# Patient Record
Sex: Female | Born: 1990 | Race: White | Hispanic: No | Marital: Single | Smoking: Former smoker
Health system: Southern US, Community
[De-identification: ages and names within clinical notes are randomized; demographics above are authoritative.]

## PROBLEM LIST (undated history)

## (undated) ENCOUNTER — Telehealth

## (undated) ENCOUNTER — Ambulatory Visit: Payer: MEDICAID | Attending: Obstetrics & Gynecology | Primary: Obstetrics & Gynecology

## (undated) ENCOUNTER — Ambulatory Visit

## (undated) ENCOUNTER — Encounter: Attending: Geriatric Medicine | Primary: Geriatric Medicine

## (undated) ENCOUNTER — Ambulatory Visit: Payer: MEDICAID

## (undated) ENCOUNTER — Encounter

## (undated) ENCOUNTER — Encounter: Attending: Hematology & Oncology | Primary: Hematology & Oncology

## (undated) ENCOUNTER — Encounter
Attending: Student in an Organized Health Care Education/Training Program | Primary: Student in an Organized Health Care Education/Training Program

## (undated) ENCOUNTER — Encounter: Attending: Family | Primary: Family

## (undated) ENCOUNTER — Encounter: Attending: Pharmacist | Primary: Pharmacist

## (undated) ENCOUNTER — Ambulatory Visit: Payer: Medicaid (Managed Care)

## (undated) ENCOUNTER — Ambulatory Visit: Payer: PRIVATE HEALTH INSURANCE

## (undated) ENCOUNTER — Ambulatory Visit: Payer: MEDICAID | Attending: Clinical | Primary: Clinical

## (undated) ENCOUNTER — Ambulatory Visit
Payer: MEDICAID | Attending: Student in an Organized Health Care Education/Training Program | Primary: Student in an Organized Health Care Education/Training Program

## (undated) ENCOUNTER — Encounter: Payer: Medicaid (Managed Care) | Attending: Children | Primary: Children

## (undated) ENCOUNTER — Encounter: Attending: Adult Health | Primary: Adult Health

## (undated) ENCOUNTER — Inpatient Hospital Stay: Payer: Medicaid (Managed Care)

## (undated) ENCOUNTER — Ambulatory Visit: Payer: PRIVATE HEALTH INSURANCE | Attending: Geriatric Medicine | Primary: Geriatric Medicine

## (undated) ENCOUNTER — Ambulatory Visit: Payer: Medicaid (Managed Care) | Attending: Geriatric Medicine | Primary: Geriatric Medicine

## (undated) ENCOUNTER — Telehealth: Attending: Adult Health | Primary: Adult Health

## (undated) ENCOUNTER — Ambulatory Visit
Payer: PRIVATE HEALTH INSURANCE | Attending: Student in an Organized Health Care Education/Training Program | Primary: Student in an Organized Health Care Education/Training Program

## (undated) ENCOUNTER — Encounter: Attending: Clinical | Primary: Clinical

## (undated) ENCOUNTER — Encounter: Attending: Acute Care | Primary: Acute Care

## (undated) ENCOUNTER — Institutional Professional Consult (permissible substitution): Payer: MEDICAID | Attending: Family | Primary: Family

## (undated) ENCOUNTER — Telehealth: Attending: Family | Primary: Family

## (undated) ENCOUNTER — Ambulatory Visit: Payer: MEDICAID | Attending: Adult Health | Primary: Adult Health

## (undated) ENCOUNTER — Ambulatory Visit: Payer: MEDICAID | Attending: Registered" | Primary: Registered"

## (undated) ENCOUNTER — Ambulatory Visit: Payer: PRIVATE HEALTH INSURANCE | Attending: Family | Primary: Family

## (undated) ENCOUNTER — Ambulatory Visit
Payer: Medicaid (Managed Care) | Attending: Student in an Organized Health Care Education/Training Program | Primary: Student in an Organized Health Care Education/Training Program

## (undated) ENCOUNTER — Encounter: Attending: Mental Health | Primary: Mental Health

## (undated) ENCOUNTER — Ambulatory Visit: Payer: MEDICAID | Attending: Geriatric Medicine | Primary: Geriatric Medicine

## (undated) ENCOUNTER — Encounter: Attending: Ambulatory Care | Primary: Ambulatory Care

## (undated) ENCOUNTER — Encounter: Attending: Registered" | Primary: Registered"

## (undated) ENCOUNTER — Ambulatory Visit: Payer: PRIVATE HEALTH INSURANCE | Attending: Ambulatory Care | Primary: Ambulatory Care

## (undated) ENCOUNTER — Telehealth: Attending: Clinical | Primary: Clinical

## (undated) ENCOUNTER — Ambulatory Visit: Payer: Medicaid (Managed Care) | Attending: Ambulatory Care | Primary: Ambulatory Care

## (undated) ENCOUNTER — Institutional Professional Consult (permissible substitution): Payer: MEDICAID

## (undated) ENCOUNTER — Telehealth: Attending: "Endocrinology | Primary: "Endocrinology

## (undated) ENCOUNTER — Telehealth: Attending: Acute Care | Primary: Acute Care

## (undated) ENCOUNTER — Other Ambulatory Visit

## (undated) ENCOUNTER — Telehealth: Attending: Geriatric Medicine | Primary: Geriatric Medicine

## (undated) ENCOUNTER — Telehealth
Attending: Student in an Organized Health Care Education/Training Program | Primary: Student in an Organized Health Care Education/Training Program

## (undated) ENCOUNTER — Encounter: Attending: Obstetrics & Gynecology | Primary: Obstetrics & Gynecology

## (undated) ENCOUNTER — Telehealth: Attending: Mental Health | Primary: Mental Health

## (undated) ENCOUNTER — Ambulatory Visit: Payer: Medicaid (Managed Care) | Attending: Family | Primary: Family

## (undated) ENCOUNTER — Encounter: Attending: Internal Medicine | Primary: Internal Medicine

## (undated) ENCOUNTER — Encounter: Attending: Physician Assistant | Primary: Physician Assistant

## (undated) ENCOUNTER — Ambulatory Visit: Payer: Medicaid (Managed Care) | Attending: Gastroenterology | Primary: Gastroenterology

## (undated) ENCOUNTER — Telehealth: Payer: MEDICAID | Attending: Clinical | Primary: Clinical

## (undated) ENCOUNTER — Telehealth: Attending: Gastroenterology | Primary: Gastroenterology

## (undated) ENCOUNTER — Ambulatory Visit: Payer: Medicaid (Managed Care) | Attending: Hematology & Oncology | Primary: Hematology & Oncology

## (undated) ENCOUNTER — Encounter: Attending: Pulmonary Disease | Primary: Pulmonary Disease

## (undated) ENCOUNTER — Encounter: Payer: Medicaid (Managed Care) | Attending: Family | Primary: Family

## (undated) ENCOUNTER — Ambulatory Visit: Payer: MEDICAID | Attending: Surgery | Primary: Surgery

## (undated) ENCOUNTER — Telehealth: Attending: Pharmacist | Primary: Pharmacist

## (undated) ENCOUNTER — Inpatient Hospital Stay

## (undated) ENCOUNTER — Telehealth: Attending: Hematology & Oncology | Primary: Hematology & Oncology

## (undated) ENCOUNTER — Encounter: Attending: Surgery | Primary: Surgery

## (undated) DIAGNOSIS — E282 Polycystic ovarian syndrome: Secondary | ICD-10-CM

## (undated) DIAGNOSIS — F32A Depression, unspecified: Secondary | ICD-10-CM

## (undated) DIAGNOSIS — F4310 Post-traumatic stress disorder, unspecified: Secondary | ICD-10-CM

## (undated) DIAGNOSIS — E119 Type 2 diabetes mellitus without complications: Secondary | ICD-10-CM

## (undated) DIAGNOSIS — I10 Essential (primary) hypertension: Secondary | ICD-10-CM

## (undated) HISTORY — DX: Type 2 diabetes mellitus without complications: E11.9

## (undated) HISTORY — DX: Essential (primary) hypertension: I10

## (undated) HISTORY — DX: Post-traumatic stress disorder, unspecified: F43.10

## (undated) HISTORY — DX: Depression, unspecified: F32.A

## (undated) HISTORY — DX: Polycystic ovarian syndrome: E28.2

## (undated) HISTORY — PX: TONSILLECTOMY AND ADENOIDECTOMY: SHX28

## (undated) HISTORY — PX: WISDOM TOOTH EXTRACTION: SHX21

## (undated) MED ORDER — HYDROXYZINE HCL 50 MG TABLET: Freq: Four times a day (QID) | ORAL | 0 days | PRN

## (undated) MED ORDER — LEVONORGESTREL-ETHINYL ESTRADIOL 0.1 MG-20 MCG TABLET: Freq: Every day | ORAL | 0 days

## (undated) MED ORDER — NITROFURANTOIN MONOHYDRATE/MACROCRYSTALS 100 MG CAPSULE: ORAL | 0 days

---
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
## 2008-01-16 ENCOUNTER — Emergency Department: Payer: Self-pay | Admitting: Emergency Medicine

## 2009-08-09 ENCOUNTER — Encounter: Payer: Self-pay | Admitting: General Practice

## 2009-08-17 ENCOUNTER — Encounter: Payer: Self-pay | Admitting: General Practice

## 2009-08-29 ENCOUNTER — Emergency Department: Payer: Self-pay | Admitting: Emergency Medicine

## 2009-09-17 ENCOUNTER — Encounter: Payer: Self-pay | Admitting: General Practice

## 2009-10-17 ENCOUNTER — Encounter: Payer: Self-pay | Admitting: General Practice

## 2009-11-17 ENCOUNTER — Encounter: Payer: Self-pay | Admitting: General Practice

## 2010-08-19 ENCOUNTER — Emergency Department: Payer: Self-pay | Admitting: Emergency Medicine

## 2010-08-27 ENCOUNTER — Emergency Department: Payer: Self-pay | Admitting: Emergency Medicine

## 2011-01-03 ENCOUNTER — Emergency Department: Payer: Self-pay | Admitting: Emergency Medicine

## 2011-01-04 ENCOUNTER — Emergency Department: Payer: Self-pay | Admitting: Emergency Medicine

## 2011-01-11 ENCOUNTER — Emergency Department: Payer: Self-pay | Admitting: Emergency Medicine

## 2011-04-24 ENCOUNTER — Ambulatory Visit: Payer: Self-pay | Admitting: Family Medicine

## 2011-04-25 ENCOUNTER — Emergency Department: Payer: Self-pay | Admitting: Emergency Medicine

## 2011-09-28 ENCOUNTER — Emergency Department: Payer: Self-pay | Admitting: Emergency Medicine

## 2011-10-12 ENCOUNTER — Emergency Department: Payer: Self-pay | Admitting: Unknown Physician Specialty

## 2011-11-14 ENCOUNTER — Ambulatory Visit: Payer: Self-pay | Admitting: Urology

## 2011-12-06 ENCOUNTER — Emergency Department: Payer: Self-pay | Admitting: *Deleted

## 2011-12-06 LAB — COMPREHENSIVE METABOLIC PANEL
Alkaline Phosphatase: 98 U/L (ref 50–136)
Anion Gap: 15 (ref 7–16)
Bilirubin,Total: 0.3 mg/dL (ref 0.2–1.0)
Calcium, Total: 9.5 mg/dL (ref 8.5–10.1)
Chloride: 99 mmol/L (ref 98–107)
Co2: 25 mmol/L (ref 21–32)
Creatinine: 0.7 mg/dL (ref 0.60–1.30)
EGFR (African American): >60
EGFR (Non-African Amer.): >60
Potassium: 3.9 mmol/L (ref 3.5–5.1)
SGPT (ALT): 17 U/L
Total Protein: 8 g/dL (ref 6.4–8.2)

## 2011-12-06 LAB — CBC
HCT: 37.2 % (ref 35.0–47.0)
HGB: 12.5 g/dL (ref 12.0–16.0)
MCV: 86 fL (ref 80–100)
Platelet: 327 10*3/uL (ref 150–440)
RBC: 4.32 10*6/uL (ref 3.80–5.20)
RDW: 14 % (ref 11.5–14.5)
WBC: 7.3 10*3/uL (ref 3.6–11.0)

## 2011-12-07 ENCOUNTER — Emergency Department: Payer: Self-pay | Admitting: Emergency Medicine

## 2011-12-07 LAB — URINALYSIS, COMPLETE
RBC,UR: 8119 /HPF (ref 0–5)
Squamous Epithelial: 22
WBC UR: 7 /HPF (ref 0–5)

## 2012-01-30 ENCOUNTER — Emergency Department: Payer: Self-pay | Admitting: Emergency Medicine

## 2012-02-14 ENCOUNTER — Emergency Department (HOSPITAL_COMMUNITY)
Admission: EM | Admit: 2012-02-14 | Discharge: 2012-02-14 | Disposition: A | Discharge status: Home / Self Care | Payer: Self-pay

## 2012-02-14 NOTE — ED Notes (Signed)
Pt states continued pelvic pain after LEEP performed 02/13/12. Pt states she took tylenol x 1 tab after the procedure and nothing since. Pt c/o continued bleeding and sense of tightness and pressure. Pt states she has no otc meds at home to take for her pain.

## 2012-02-24 ENCOUNTER — Emergency Department: Payer: Self-pay | Admitting: Emergency Medicine

## 2012-03-10 ENCOUNTER — Inpatient Hospital Stay: Payer: Self-pay | Admitting: Psychiatry

## 2012-03-10 LAB — COMPREHENSIVE METABOLIC PANEL
Albumin: 3.1 g/dL — ABNORMAL LOW (ref 3.4–5.0)
Alkaline Phosphatase: 101 U/L (ref 50–136)
Anion Gap: 14 (ref 7–16)
Bilirubin,Total: 0.2 mg/dL (ref 0.2–1.0)
Calcium, Total: 9.2 mg/dL (ref 8.5–10.1)
Chloride: 101 mmol/L (ref 98–107)
Co2: 22 mmol/L (ref 21–32)
Creatinine: 0.75 mg/dL (ref 0.60–1.30)
EGFR (African American): >60
EGFR (Non-African Amer.): >60
Glucose: 124 mg/dL — ABNORMAL HIGH (ref 65–99)
Osmolality: 274 (ref 275–301)
Potassium: 3.8 mmol/L (ref 3.5–5.1)
SGPT (ALT): 10 U/L — ABNORMAL LOW
Total Protein: 7.5 g/dL (ref 6.4–8.2)

## 2012-03-10 LAB — URINALYSIS, COMPLETE
Bacteria: NONE SEEN
Glucose,UR: NEGATIVE mg/dL (ref 0–75)
Ketone: NEGATIVE
Ph: 6 (ref 4.5–8.0)
Protein: NEGATIVE
RBC,UR: <1 /HPF (ref 0–5)
Specific Gravity: 1.004 (ref 1.003–1.030)

## 2012-03-10 LAB — CBC
HCT: 35.3 % (ref 35.0–47.0)
HGB: 11.3 g/dL — ABNORMAL LOW (ref 12.0–16.0)
MCHC: 31.9 g/dL — ABNORMAL LOW (ref 32.0–36.0)
MCV: 85 fL (ref 80–100)
Platelet: 357 10*3/uL (ref 150–440)
RBC: 4.16 10*6/uL (ref 3.80–5.20)
RDW: 13.8 % (ref 11.5–14.5)
WBC: 11.6 10*3/uL — ABNORMAL HIGH (ref 3.6–11.0)

## 2012-03-10 LAB — ETHANOL
Ethanol %: <0.003 % (ref 0.000–0.080)
Ethanol: <3 mg/dL

## 2012-03-10 LAB — DRUG SCREEN, URINE
Amphetamines, Ur Screen: NEGATIVE (ref ?–1000)
Barbiturates, Ur Screen: NEGATIVE (ref ?–200)
Cocaine Metabolite,Ur ~~LOC~~: NEGATIVE (ref ?–300)
MDMA (Ecstasy)Ur Screen: NEGATIVE (ref ?–500)
Phencyclidine (PCP) Ur S: NEGATIVE (ref ?–25)
Tricyclic, Ur Screen: NEGATIVE (ref ?–1000)

## 2012-03-10 LAB — PREGNANCY, URINE: Pregnancy Test, Urine: NEGATIVE m[IU]/mL

## 2012-03-10 LAB — ACETAMINOPHEN LEVEL: Acetaminophen: <2 ug/mL

## 2012-03-11 LAB — CBC WITH DIFFERENTIAL/PLATELET
Basophil %: 0.3 %
Eosinophil #: 0.1 10*3/uL (ref 0.0–0.7)
HCT: 34.4 % — ABNORMAL LOW (ref 35.0–47.0)
HGB: 11.1 g/dL — ABNORMAL LOW (ref 12.0–16.0)
Lymphocyte #: 3.6 10*3/uL (ref 1.0–3.6)
MCH: 27.4 pg (ref 26.0–34.0)
MCHC: 32.2 g/dL (ref 32.0–36.0)
Monocyte %: 6 %
Neutrophil %: 56.9 %
Platelet: 324 10*3/uL (ref 150–440)
RBC: 4.04 10*6/uL (ref 3.80–5.20)
RDW: 14 % (ref 11.5–14.5)

## 2012-07-12 ENCOUNTER — Emergency Department: Payer: Self-pay | Admitting: Emergency Medicine

## 2012-12-22 ENCOUNTER — Encounter: Payer: Self-pay | Admitting: General Practice

## 2013-01-15 ENCOUNTER — Encounter: Payer: Self-pay | Admitting: General Practice

## 2013-03-16 ENCOUNTER — Emergency Department: Payer: Self-pay | Admitting: Emergency Medicine

## 2013-05-07 ENCOUNTER — Emergency Department: Payer: Self-pay | Admitting: Emergency Medicine

## 2013-05-07 LAB — URINALYSIS, COMPLETE
Bilirubin,UR: NEGATIVE
Glucose,UR: NEGATIVE mg/dL (ref 0–75)
Nitrite: NEGATIVE
Protein: 100
RBC,UR: 1 /HPF (ref 0–5)
Specific Gravity: 1.012 (ref 1.003–1.030)
Squamous Epithelial: 6

## 2013-05-10 ENCOUNTER — Emergency Department: Payer: Self-pay | Admitting: Emergency Medicine

## 2013-05-10 LAB — BASIC METABOLIC PANEL
Anion Gap: 10 (ref 7–16)
BUN: 8 mg/dL (ref 7–18)
Calcium, Total: 9.3 mg/dL (ref 8.5–10.1)
Chloride: 104 mmol/L (ref 98–107)
Co2: 22 mmol/L (ref 21–32)
Creatinine: 0.76 mg/dL (ref 0.60–1.30)
EGFR (African American): >60
EGFR (Non-African Amer.): >60
Glucose: 250 mg/dL — ABNORMAL HIGH (ref 65–99)
Osmolality: 279 (ref 275–301)
Potassium: 3.7 mmol/L (ref 3.5–5.1)
Sodium: 136 mmol/L (ref 136–145)

## 2013-05-10 LAB — URINALYSIS, COMPLETE
Bilirubin,UR: NEGATIVE
Blood: NEGATIVE
Glucose,UR: 50 mg/dL (ref 0–75)
Ketone: NEGATIVE
Nitrite: POSITIVE
Ph: 5 (ref 4.5–8.0)
RBC,UR: 6 /HPF (ref 0–5)
Transitional Epi: <1
WBC UR: 4 /HPF (ref 0–5)

## 2013-05-10 LAB — CBC
HCT: 34.2 % — ABNORMAL LOW (ref 35.0–47.0)
HGB: 11.4 g/dL — ABNORMAL LOW (ref 12.0–16.0)
MCV: 82 fL (ref 80–100)
Platelet: 369 10*3/uL (ref 150–440)
RBC: 4.18 10*6/uL (ref 3.80–5.20)
RDW: 15.3 % — ABNORMAL HIGH (ref 11.5–14.5)
WBC: 10.3 10*3/uL (ref 3.6–11.0)

## 2013-05-10 LAB — WET PREP, GENITAL

## 2013-05-10 LAB — GC/CHLAMYDIA PROBE AMP

## 2013-05-23 ENCOUNTER — Emergency Department: Payer: Self-pay | Admitting: Emergency Medicine

## 2013-08-10 ENCOUNTER — Emergency Department: Payer: Self-pay | Admitting: Emergency Medicine

## 2013-08-10 LAB — CBC
HCT: 32.9 % — ABNORMAL LOW (ref 35.0–47.0)
HGB: 11 g/dL — ABNORMAL LOW (ref 12.0–16.0)
MCHC: 33.4 g/dL (ref 32.0–36.0)
Platelet: 356 10*3/uL (ref 150–440)
RBC: 4.11 10*6/uL (ref 3.80–5.20)
RDW: 15 % — ABNORMAL HIGH (ref 11.5–14.5)
WBC: 13.7 10*3/uL — ABNORMAL HIGH (ref 3.6–11.0)

## 2013-08-10 LAB — BASIC METABOLIC PANEL
Anion Gap: 8 (ref 7–16)
BUN: 10 mg/dL (ref 7–18)
Calcium, Total: 9.3 mg/dL (ref 8.5–10.1)
Chloride: 101 mmol/L (ref 98–107)
Co2: 26 mmol/L (ref 21–32)
EGFR (African American): >60
Glucose: 151 mg/dL — ABNORMAL HIGH (ref 65–99)
Sodium: 135 mmol/L — ABNORMAL LOW (ref 136–145)

## 2013-11-07 ENCOUNTER — Emergency Department: Payer: Self-pay | Admitting: Emergency Medicine

## 2013-11-07 LAB — CBC
HCT: 34 % — ABNORMAL LOW (ref 35.0–47.0)
HGB: 10.5 g/dL — ABNORMAL LOW (ref 12.0–16.0)
MCV: 82 fL (ref 80–100)
RBC: 4.13 10*6/uL (ref 3.80–5.20)
RDW: 15.4 % — ABNORMAL HIGH (ref 11.5–14.5)
WBC: 12.2 10*3/uL — ABNORMAL HIGH (ref 3.6–11.0)

## 2013-11-07 LAB — COMPREHENSIVE METABOLIC PANEL
Alkaline Phosphatase: 114 U/L
BUN: 7 mg/dL (ref 7–18)
Chloride: 104 mmol/L (ref 98–107)
Creatinine: 0.57 mg/dL — ABNORMAL LOW (ref 0.60–1.30)
EGFR (African American): >60
EGFR (Non-African Amer.): >60
Osmolality: 276 (ref 275–301)
SGOT(AST): 21 U/L (ref 15–37)
SGPT (ALT): 13 U/L (ref 12–78)
Total Protein: 7.4 g/dL (ref 6.4–8.2)

## 2013-11-07 LAB — LIPASE, BLOOD: Lipase: 129 U/L (ref 73–393)

## 2013-11-08 LAB — URINALYSIS, COMPLETE
Bilirubin,UR: NEGATIVE
Ketone: NEGATIVE
Nitrite: NEGATIVE
Ph: 6 (ref 4.5–8.0)
Protein: 100
Squamous Epithelial: 4
WBC UR: 4 /HPF (ref 0–5)
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
## 2014-06-04 ENCOUNTER — Emergency Department: Payer: Self-pay | Admitting: Emergency Medicine

## 2014-06-04 LAB — URINALYSIS, COMPLETE
BLOOD: NEGATIVE
Bilirubin,UR: NEGATIVE
KETONE: NEGATIVE
LEUKOCYTE ESTERASE: NEGATIVE
NITRITE: NEGATIVE
PH: 6 (ref 4.5–8.0)
Specific Gravity: 1.016 (ref 1.003–1.030)
Squamous Epithelial: 2

## 2014-06-04 LAB — BASIC METABOLIC PANEL
Anion Gap: 10 (ref 7–16)
BUN: 8 mg/dL (ref 7–18)
CO2: 26 mmol/L (ref 21–32)
Calcium, Total: 9 mg/dL (ref 8.5–10.1)
Chloride: 101 mmol/L (ref 98–107)
Creatinine: 0.82 mg/dL (ref 0.60–1.30)
EGFR (African American): >60
EGFR (Non-African Amer.): >60
GLUCOSE: 275 mg/dL — AB (ref 65–99)
Osmolality: 282 (ref 275–301)
POTASSIUM: 4.2 mmol/L (ref 3.5–5.1)
SODIUM: 137 mmol/L (ref 136–145)

## 2014-06-04 LAB — CBC
HCT: 35.6 % (ref 35.0–47.0)
HGB: 11.6 g/dL — AB (ref 12.0–16.0)
MCH: 27.1 pg (ref 26.0–34.0)
MCHC: 32.7 g/dL (ref 32.0–36.0)
MCV: 83 fL (ref 80–100)
Platelet: 384 10*3/uL (ref 150–440)
RBC: 4.29 10*6/uL (ref 3.80–5.20)
RDW: 15.1 % — AB (ref 11.5–14.5)
WBC: 10.3 10*3/uL (ref 3.6–11.0)

## 2014-06-08 ENCOUNTER — Emergency Department: Payer: Self-pay | Admitting: Emergency Medicine

## 2014-06-08 LAB — URINALYSIS, COMPLETE
Bilirubin,UR: NEGATIVE
Blood: NEGATIVE
Glucose,UR: NEGATIVE mg/dL (ref 0–75)
Leukocyte Esterase: NEGATIVE
NITRITE: NEGATIVE
Ph: 5 (ref 4.5–8.0)
Protein: 30
Specific Gravity: 1.016 (ref 1.003–1.030)
Squamous Epithelial: 4
WBC UR: 2 /HPF (ref 0–5)

## 2014-06-08 LAB — CBC WITH DIFFERENTIAL/PLATELET
Basophil #: 0 10*3/uL (ref 0.0–0.1)
Basophil %: 0.3 %
Eosinophil #: 0.2 10*3/uL (ref 0.0–0.7)
Eosinophil %: 1.6 %
HCT: 35.7 % (ref 35.0–47.0)
HGB: 11.5 g/dL — ABNORMAL LOW (ref 12.0–16.0)
LYMPHS PCT: 42.5 %
Lymphocyte #: 5.4 10*3/uL — ABNORMAL HIGH (ref 1.0–3.6)
MCH: 26.5 pg (ref 26.0–34.0)
MCHC: 32.2 g/dL (ref 32.0–36.0)
MCV: 82 fL (ref 80–100)
MONOS PCT: 4.3 %
Monocyte #: 0.5 x10 3/mm (ref 0.2–0.9)
Neutrophil #: 6.5 10*3/uL (ref 1.4–6.5)
Neutrophil %: 51.3 %
Platelet: 372 10*3/uL (ref 150–440)
RBC: 4.34 10*6/uL (ref 3.80–5.20)
RDW: 14.8 % — ABNORMAL HIGH (ref 11.5–14.5)
WBC: 12.6 10*3/uL — ABNORMAL HIGH (ref 3.6–11.0)

## 2014-06-08 LAB — BASIC METABOLIC PANEL
ANION GAP: 11 (ref 7–16)
BUN: 14 mg/dL (ref 7–18)
CALCIUM: 9.2 mg/dL (ref 8.5–10.1)
CHLORIDE: 97 mmol/L — AB (ref 98–107)
CREATININE: 0.6 mg/dL (ref 0.60–1.30)
Co2: 24 mmol/L (ref 21–32)
Glucose: 237 mg/dL — ABNORMAL HIGH (ref 65–99)
Osmolality: 273 (ref 275–301)
POTASSIUM: 4.1 mmol/L (ref 3.5–5.1)
Sodium: 132 mmol/L — ABNORMAL LOW (ref 136–145)

## 2014-08-01 IMAGING — US ABDOMEN ULTRASOUND LIMITED
1 series · 14 of 25 positions shown · non-contrast
Comparison: None.

CLINICAL DATA: Right upper quadrant pain.  Nausea.

EXAM:
US ABDOMEN LIMITED - RIGHT UPPER QUADRANT

[Series 1: abdomen ultrasound limited · 0.31mm/px · 14 of 52 slices shown]
[im 1/52]
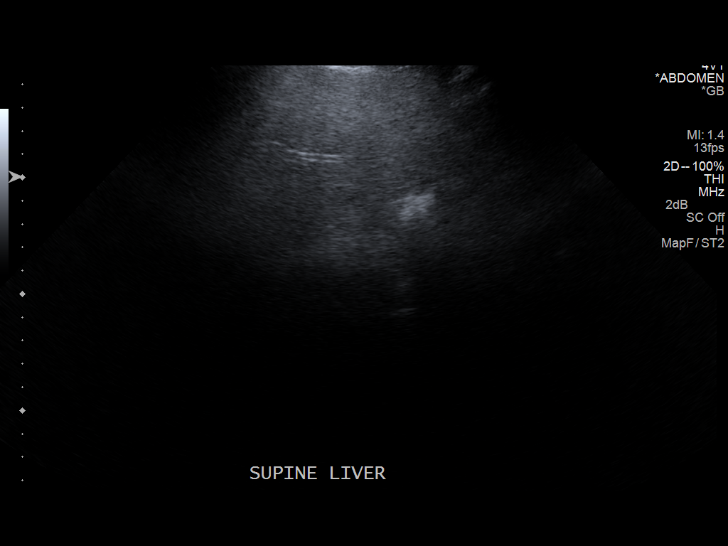
[im 5/52]
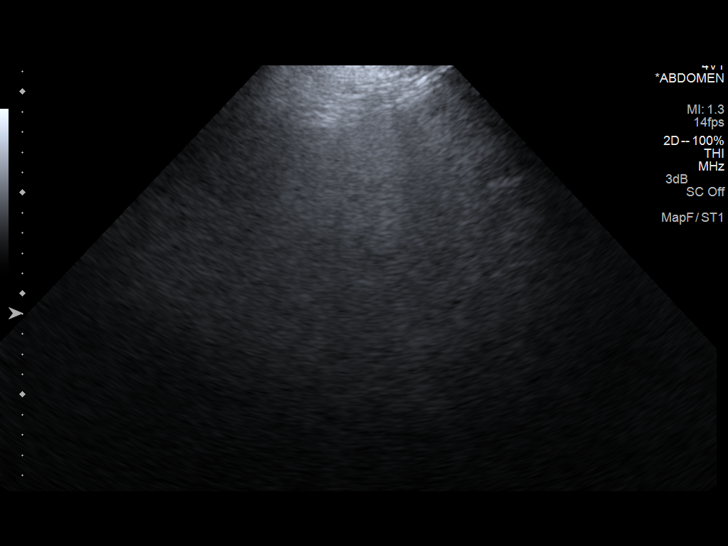
[im 9/52]
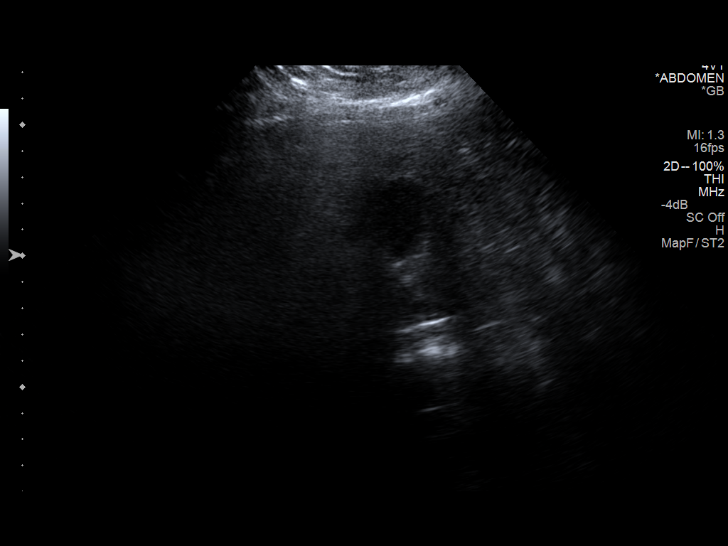
[im 13/52]
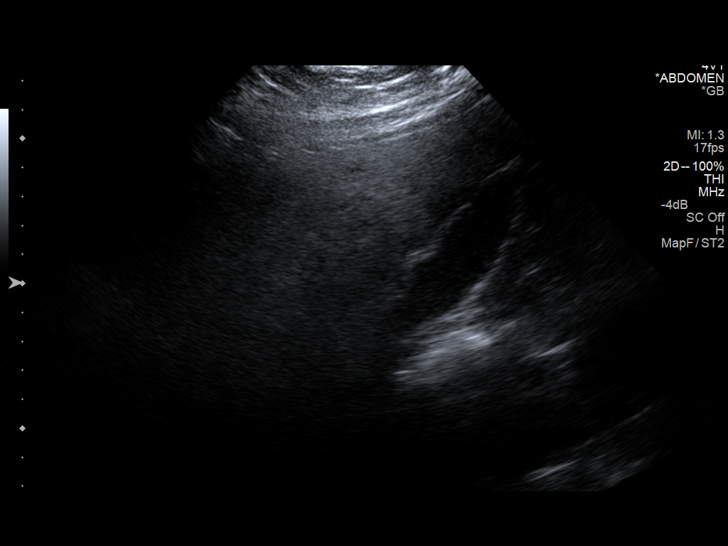
[im 18/52]
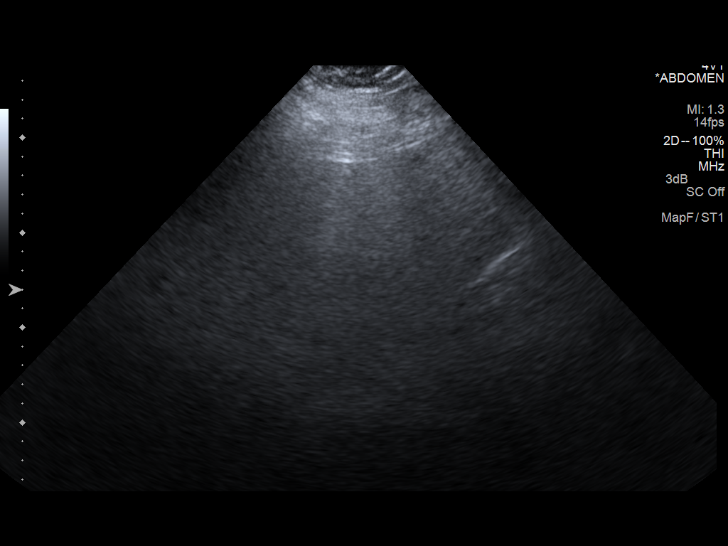
[im 20/52]
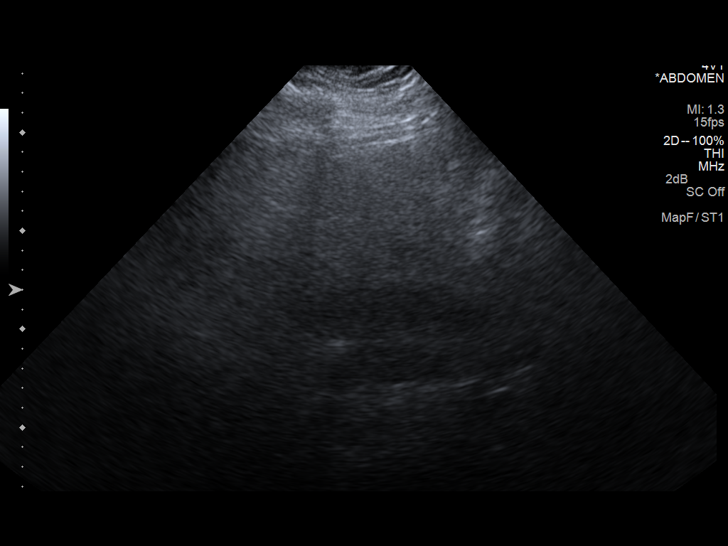
[im 24/52]
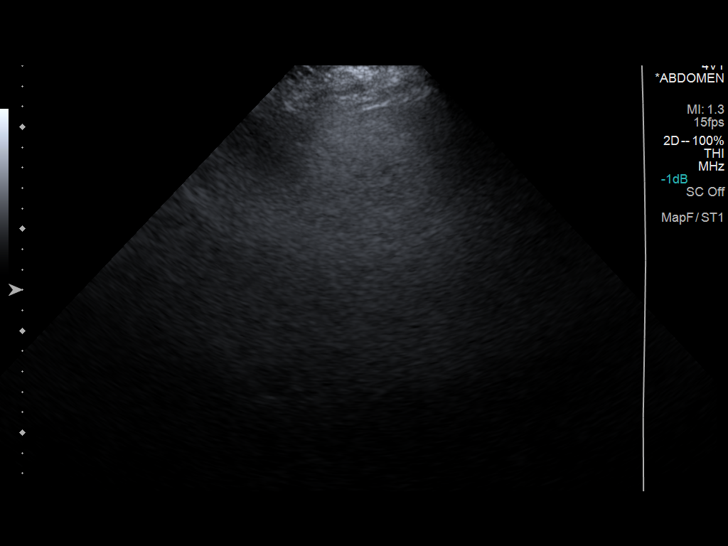
[im 28/52]
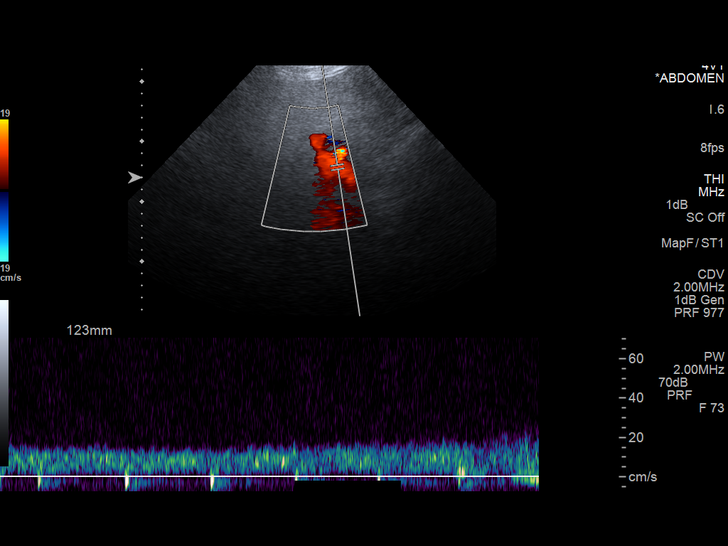
[im 32/52]
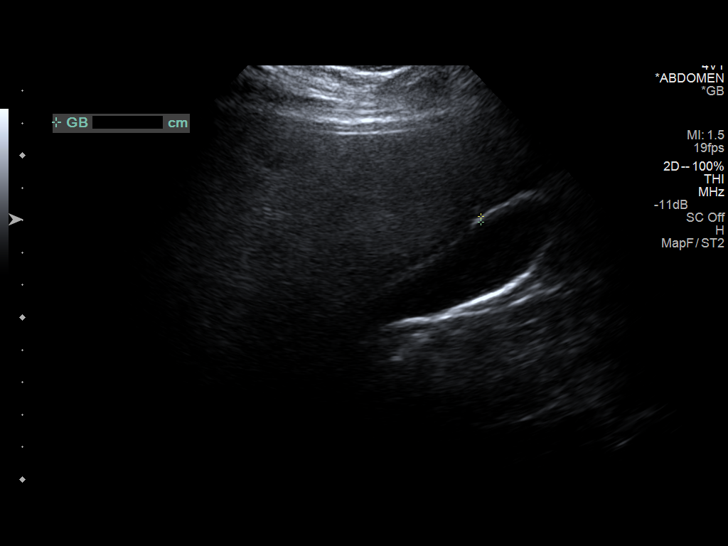
[im 35/52]
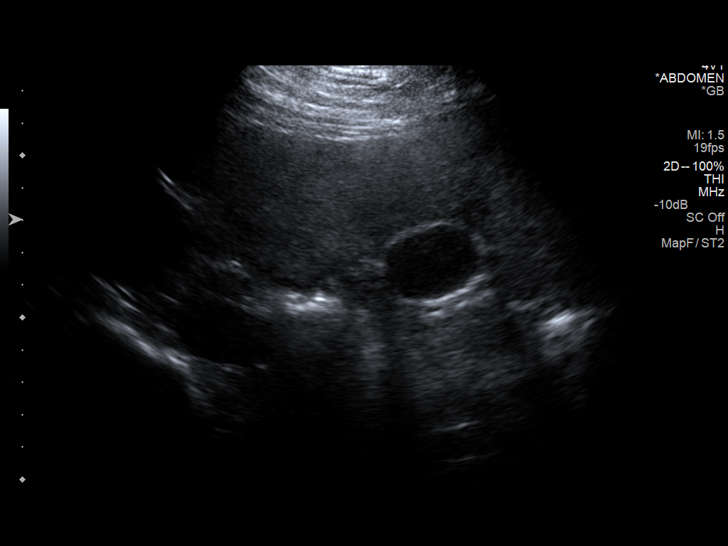
[im 39/52]
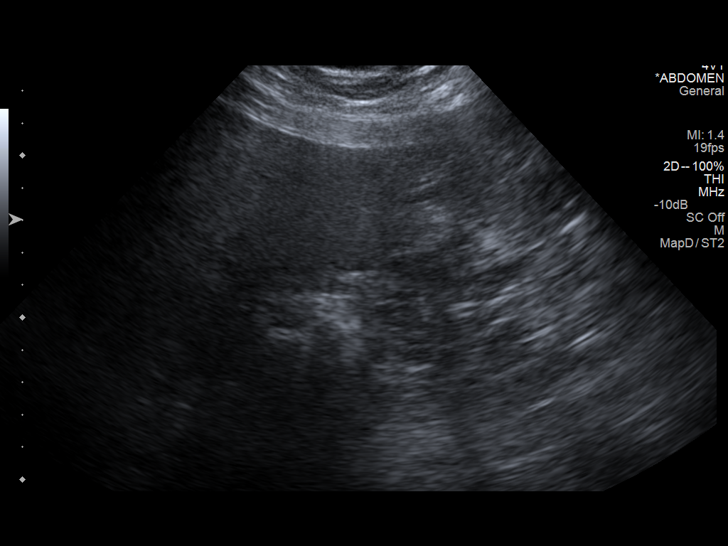
[im 43/52]
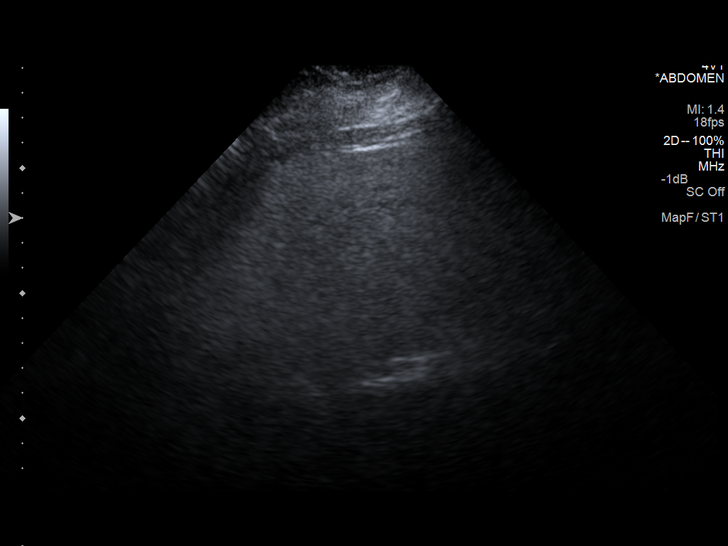
[im 47/52]
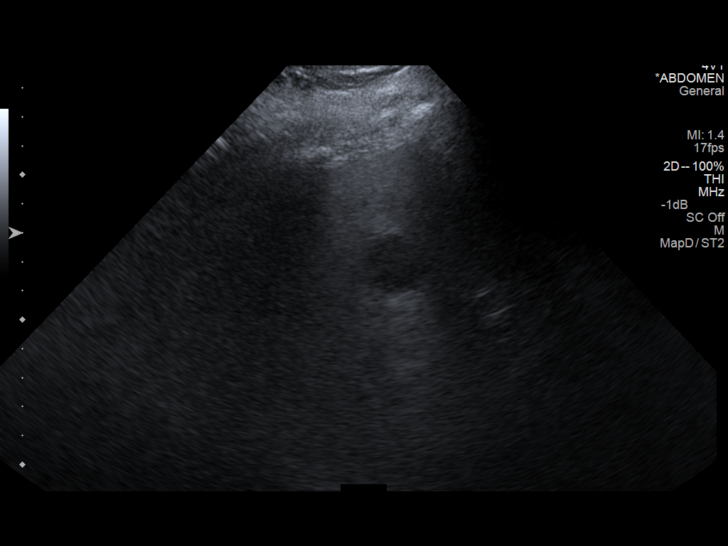
[im 52/52]
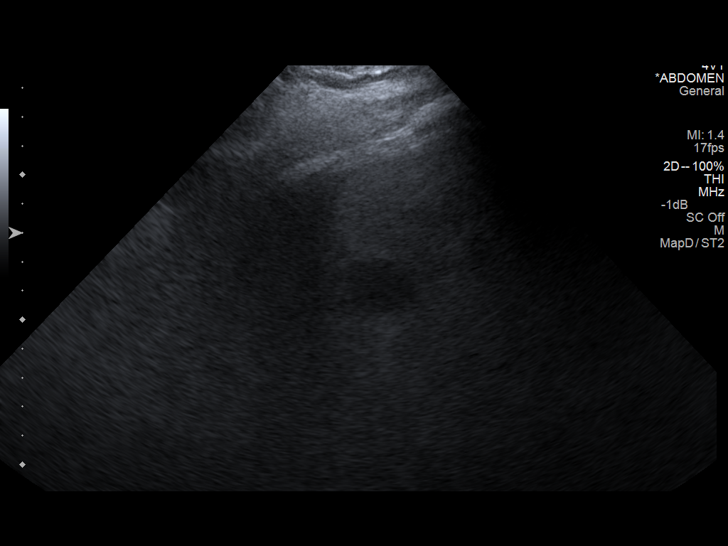

[14 of 25 positions shown; findings below may reference images not displayed]

FINDINGS: Gallbladder

No gallstones or wall thickening visualized. No sonographic Murphy
sign noted.

Common bile duct

Diameter: 3 mm, normal.

Liver:

Diffusely echogenic, probably representing hepatosteatosis. The
liver is difficult to penetrates sonographically. Possible
hypoechoic area in the right hepatic lobe measuring about 2 cm.
IMPRESSION: 1. Negative for cholelithiasis or cholecystitis.
2. Echogenic liver, likely representing hepatosteatosis.
3. Suboptimal visualization of the liver with a possible 2 cm
hypoechoic lesion. Followup routine MRI with and without contrast of
the liver may be useful for further assessment. This is unlikely to
be well visualized with a followup ultrasound. Non-emergent MRI
should be deferred until patient has been discharged for the acute
illness, and can optimally cooperate with positioning and
breath-holding instructions.

## 2014-08-05 ENCOUNTER — Emergency Department: Payer: Self-pay | Admitting: Emergency Medicine

## 2014-08-05 LAB — URINALYSIS, COMPLETE
Bilirubin,UR: NEGATIVE
Blood: NEGATIVE
GLUCOSE, UR: NEGATIVE mg/dL (ref 0–75)
KETONE: NEGATIVE
Leukocyte Esterase: NEGATIVE
NITRITE: NEGATIVE
Ph: 5 (ref 4.5–8.0)
RBC,UR: 1 /HPF (ref 0–5)
Specific Gravity: 1.027 (ref 1.003–1.030)
Squamous Epithelial: 2
WBC UR: 2 /HPF (ref 0–5)

## 2015-03-11 NOTE — H&P (Signed)
PATIENT NAME:  Cynthia Lang, Cynthia Lang MR#:  [id number] DATE OF BIRTH:  [birth year]  DATE OF ADMISSION:  03/10/2012  CHIEF COMPLAINT AND IDENTIFYING DATA: Cynthia Lang is a 24 year old female presenting to the Emergency Department after an overdose in a suicide attempt.   HISTORY OF PRESENT ILLNESS: Cynthia Lang has been displaying lability of mood alternating between expansive anger and using loud profanity at police officers followed by a very depressed mood and crying. She has fractured speech and racing thoughts. Her judgment is impaired.   Her orientation is intact. Her memory function appears to be intact.   The symptoms appear to have been occurring for approximately four days and progressive.   She does acknowledge being off her medication of Lamictal 125 mg b.i.d., trazodone 100 mg at bedtime, Zoloft 25 mg daily, and Geodon 40 mg b.i.d.   She did have an argument with her boyfriend. She was angry that she did not consider her mother supportive. Other stresses include that the patient was kicked out of her apartment and that she had to end her relationship with her psychotherapist.   PAST PSYCHIATRIC HISTORY: Cynthia Lang has a history of previous self-destructiveness. She overdosed on a bottle of Tylenol in the past. She has no history of illegal drug use or alcohol use.   Please see the above regarding past psychotropic medication. She does carry a diagnosis of bipolar disorder.   FAMILY PSYCHIATRIC HISTORY: None known.   SOCIAL HISTORY: Please see the above. She was recently evicted from her apartment.   PAST MEDICAL HISTORY:  1. Gastroesophageal reflux disease.  2. Hypertension.  3. Diabetes mellitus.  4. Asthma.   ALLERGIES: Lisinopril, Percocet, Phenergan, Zofran, ibuprofen, Abilify.   LABORATORY DATA: Aspirin negative. Tylenol negative. Pregnancy test urine negative. Urine drug screen negative. Urinalysis unremarkable. TSH normal. Alcohol negative. Basic metabolic panel  unremarkable except for mildly elevated glucose at 124. CBC slightly elevated WBC at 11.6, hemoglobin 11.3. Platelet count normal at 357.   REVIEW OF SYSTEMS: Constitutional, HEENT, mouth, neurologic, psychiatric, cardiovascular, respiratory, gastrointestinal, genitourinary, skin, musculoskeletal, hematologic, lymphatic, endocrine, metabolic all unremarkable.   PHYSICAL EXAMINATION:  VITAL SIGNS: Temperature 96.9, pulse 89, respiratory rate 18, blood pressure 135/74.   GENERAL: The patient was examined with female escort.   GENERAL APPEARANCE: Cynthia Lang is a well-developed, well-nourished young female appearing her chronologic age with no abnormal involuntary movements. Her muscle tone is normal. She has intact and normal grooming and hygiene.   HEENT:  Head normocephalic, atraumatic. Pupils equally round and reactive to light and accommodation. Oropharynx clear without erythema.   EXTREMITIES: No cyanosis, clubbing, or edema.   SKIN: No rashes. Normal turgor.   NECK: Supple. No masses. No tenderness.   RESPIRATORY: Lungs clear to auscultation. No wheezing, rhonchi, or rales.   CARDIOVASCULAR: Regular rate and rhythm. No murmurs, rubs, or gallops.   ABDOMEN: Soft, nontender. No masses. Bowel sounds positive.   GENITOURINARY: Deferred.   NEUROLOGIC: Cranial nerves II through XII intact. General sensory intact throughout to light touch. Motor 5/5 strength throughout. Coordination intact by finger-to-nose bilaterally. Deep tendon reflexes normal strength and symmetry throughout. No Babinski.   MENTAL STATUS EXAM: Cynthia Lang is alert. Her eye contact is intermittent. Concentration mildly decreased. She is oriented to all spheres. Her memory is intact to immediate, recent, and remote. Her use of language, fund of knowledge, and intelligence are normal. Her speech is pressured. There is no dysarthria. Thought process does involve tangentiality as well as racing thoughts. There is  no  looseness of association. Thought content: Denies thoughts of harming herself or others. There are no hallucinations or delusions. Her affect is expansive with lability. There is crying and constriction alternating with some euphoria and irritability at times. Mood is consistent with affect. Insight is partial. Judgment is impaired other than the patient does have the ability to appreciate her need for psychiatric care.   ASSESSMENT:  AXIS I:  Bipolar I disorder, mixed.   AXIS II: Deferred.   AXIS III: Hypertension. Please see the past medical history.   AXIS IV: Economic, primary support group.   AXIS V: 30.   Cynthia Lang does have the presence of mood destabilization with a presentation consistent with bipolar I disorder, mixed. This places her in a state of impaired judgment and a risk of potentially lethal self neglect. She also has demonstrated a risk of suicide.   PLAN:  1. Therefore, we will admit her to the inpatient behavioral health unit. 2. We will restart her on her psychotropic medications that have been effective in the past including her Lamictal as the primary mood stabilizer, Zoloft 25 mg every a.m. for anti-depression, and Geodon as augmentation for mood stabilization.  3. She will undergo supportive therapy and progress to normal group therapy and milieu therapy.  4. Ativan will be available at 1 to 2 mg q. 6 hours p.r.n. agitation.   5. In addition to the Zoloft, she will have bupropion at 150 mg XL daily for anti-depression. Again, the above psychotropic regimen has been effective in the past and can be adjusted during her hospitalization for efficacy.  6. Her Geodon history has been corrected. The above psychotropic agents have already been restarted by the Emergency Room physician except for the Ativan p.r.n. added by the undersigned.      ____________________________ Drue Stager. Markita Stcharles, MD [id number] D: 03/10/2012 22:33:25 ET T: 03/11/2012 07:10:02  ET JOB#: [id number]  cc: Drue Stager. Nikoloz Huy, MD, <Dictator> Billie Ruddy MD ELECTRONICALLY SIGNED 03/17/2012 1:47

## 2015-03-11 NOTE — Discharge Summary (Signed)
PATIENT NAME:  Cynthia Lang, MCHATTON MR#:  [id number] DATE OF BIRTH:  [birth year]  DATE OF ADMISSION:  03/10/2012 DATE OF DISCHARGE:  03/18/2012  HISTORY OF PRESENT ILLNESS: Ms. Perfetti is a 24 year old female admitted to the inpatient behavioral health unit after an overdose with suicidal intent. She had been experiencing severe lability of mood alternating between expansive anger and using loud profanity at police officers and then being very depressed and crying. She was demonstrating racing speech as well as racing thoughts. She was also showing very impaired judgment. Please see the admission history and physical.   ANCILLARY CLINICAL DATA:  None.   HOSPITAL COURSE: Ms. Cynthia Lang was admitted to the inpatient behavioral health unit and underwent milieu and group psychotherapy. Initially she was tried on Seroquel as a replacement for the Geodon. She had stated that the Geodon had caused her to be agitated. However the Seroquel was experiences making her more agitated and she wanted to switch back to Geodon. The Geodon was titrated to 80 mg b.i.d.   Initially her QTc on her EKG was above 550 milliseconds. Upon restarting the Geodon at a lower dose prior to admission her QTc fell below 450 milliseconds.  As her hospital course progressed, her thoughts slowed down. Her mood became level and her social interaction became normal.   CONDITION ON DISCHARGE: By the 2nd of May Ms. Muckle has normal mood and interest. She has intact hope. She does not have any thoughts of harming herself or others. She has no hallucinations or delusion. She has no racing thoughts. She is also showing no medication side effects.   MENTAL STATUS EXAM UPON DISCHARGE: Ms. Cynthia Lang is alert. Her eye contact is good. She is oriented to all spheres. Her memory is intact to immediate, recent, and remote. Her fund of knowledge, use of language and intelligence are all normal. Her speech involves normal rate and prosody without dysarthria.  Thought process is logical, coherent, and goal directed. No looseness of associations. Thought content: No thoughts of harming herself or others. No delusions. No hallucinations. Affect is broad and appropriate. Mood is normal. Insight intact. Judgment intact.   DISCHARGE DIAGNOSES:  AXIS I:  Bipolar I disorder, mixed, now in clinical remission.   AXIS II: Deferred.   AXIS III: Hypertension. Please see the admission history and physical.  AXIS IV: Economic, primary support group.   AXIS V: 55.   Ms. Abts is not at risk to harm herself or others. She agrees to call emergency services immediately for any thoughts of harming himself, thoughts of harming others, or distress.  She agrees to not drive if drowsy.   DIET: American Diabetic [employer] Diet.   ACTIVITY: Routine.   DISCHARGE MEDICATIONS:  1. Atenolol 100 mg daily.  2. Metformin 1000 mg b.i.d.  3. Hydrochloride 25 mg daily. 4. Losartan 25 mg daily.  5. Zyrtec 10 mg daily. 6. Omeprazole 20 mg b.i.d.  7. Wellbutrin XL 150 mg daily.  8. Zoloft 25 mg daily. 9. Geodon 80 mg b.i.d.  10. Lamictal 150 milligram b.i.d.  11. Trazodone 100 mg at bedtime.  12. Ventolin HFA 90 microgram inhaler two puffs p.r.n.  13. Ortho Evra transdermal once a week patch on Monday.   FOLLOW-UP: Follow-up with [facility] on 03/19/2012 at 2:20 p.m.   ____________________________ Drue Stager. Rilea Arutyunyan, MD [email or web address] D: 03/29/2012 19:42:30 ET T: 03/30/2012 11:08:33 ET JOB#: [id number]  cc: Drue Stager. Marjie Chea, MD, <Dictator> Billie Ruddy MD ELECTRONICALLY SIGNED 03/31/2012 19:07

## 2017-06-06 ENCOUNTER — Emergency Department
Admission: EM | Admit: 2017-06-06 | Discharge: 2017-06-06 | Disposition: A | Discharge status: Home / Self Care | Payer: MEDICAID | Source: Intra-hospital | Attending: Critical Care Medicine | Admitting: Critical Care Medicine

## 2017-06-15 ENCOUNTER — Ambulatory Visit
Admission: EM | Admit: 2017-06-15 | Discharge: 2017-06-16 | Disposition: A | Discharge status: Other Acute Inpatient Hospital | Source: Assisted Living Facility | Attending: Psychiatry | Admitting: Psychiatry

## 2017-07-16 ENCOUNTER — Ambulatory Visit
Admission: RE | Admit: 2017-07-16 | Discharge: 2017-07-16 | Disposition: A | Discharge status: Home / Self Care | Payer: MEDICAID | Admitting: Internal Medicine

## 2017-07-16 DIAGNOSIS — E1142 Type 2 diabetes mellitus with diabetic polyneuropathy: Secondary | ICD-10-CM

## 2017-07-16 DIAGNOSIS — I10 Essential (primary) hypertension: Secondary | ICD-10-CM

## 2017-07-16 DIAGNOSIS — F315 Bipolar disorder, current episode depressed, severe, with psychotic features: Principal | ICD-10-CM

## 2017-07-16 DIAGNOSIS — K769 Liver disease, unspecified: Secondary | ICD-10-CM

## 2017-07-23 ENCOUNTER — Ambulatory Visit
Admission: RE | Admit: 2017-07-23 | Discharge: 2017-07-23 | Disposition: A | Discharge status: Home / Self Care | Payer: MEDICAID

## 2017-07-23 DIAGNOSIS — K769 Liver disease, unspecified: Principal | ICD-10-CM

## 2017-07-30 MED ORDER — JANUVIA 100 MG TABLET
ORAL_TABLET | Freq: Every day | ORAL | 2 refills | 0 days | Status: CP
Start: 2017-07-30 — End: 2018-01-13

## 2017-08-15 ENCOUNTER — Emergency Department
Admission: EM | Admit: 2017-08-15 | Discharge: 2017-08-15 | Disposition: A | Discharge status: Home / Self Care | Payer: MEDICAID | Source: Intra-hospital | Attending: Family | Admitting: Family

## 2017-08-15 MED ORDER — FLUCONAZOLE 150 MG TABLET
ORAL_TABLET | Freq: Once | ORAL | 1 refills | 0 days | Status: CP
Start: 2017-08-15 — End: 2017-08-15

## 2017-08-15 MED ORDER — DOXYCYCLINE HYCLATE 100 MG CAPSULE
ORAL_CAPSULE | Freq: Two times a day (BID) | ORAL | 0 refills | 0 days | Status: CP
Start: 2017-08-15 — End: 2017-08-25

## 2017-08-15 MED ORDER — AMOXICILLIN 875 MG-POTASSIUM CLAVULANATE 125 MG TABLET
ORAL_TABLET | Freq: Two times a day (BID) | ORAL | 0 refills | 0.00000 days | Status: CP
Start: 2017-08-15 — End: 2017-08-25

## 2017-08-17 MED ORDER — PREGABALIN 75 MG CAPSULE
ORAL_CAPSULE | Freq: Three times a day (TID) | ORAL | 0 refills | 0.00000 days
Start: 2017-08-17 — End: 2017-11-17

## 2017-08-21 ENCOUNTER — Ambulatory Visit
Admission: RE | Admit: 2017-08-21 | Discharge: 2017-08-21 | Disposition: A | Discharge status: Home / Self Care | Payer: MEDICAID | Admitting: Internal Medicine

## 2017-08-21 DIAGNOSIS — L089 Local infection of the skin and subcutaneous tissue, unspecified: Principal | ICD-10-CM

## 2017-08-21 DIAGNOSIS — D519 Vitamin B12 deficiency anemia, unspecified: Secondary | ICD-10-CM

## 2017-08-21 MED ORDER — EPINEPHRINE 0.3 MG/0.3 ML INJECTION, AUTO-INJECTOR
Freq: Once | SUBCUTANEOUS | 0 refills | 0.00000 days | Status: CP
Start: 2017-08-21 — End: 2017-12-02

## 2017-08-21 MED ORDER — EPINEPHRINE 0.3 MG/0.3 ML INJECTION, AUTO-INJECTOR: 0 mg | mL | Freq: Once | 0 refills | 0 days | Status: AC

## 2017-09-01 MED ORDER — NORTREL 0.5/35 (28) 0.5 MG-35 MCG TABLET
ORAL_TABLET | 3 refills | 0 days | Status: CP
Start: 2017-09-01 — End: 2018-06-28

## 2017-09-11 ENCOUNTER — Ambulatory Visit: Admission: RE | Admit: 2017-09-11 | Discharge: 2017-09-11 | Disposition: A | Discharge status: Home / Self Care

## 2017-09-11 ENCOUNTER — Ambulatory Visit
Admission: RE | Admit: 2017-09-11 | Discharge: 2017-09-11 | Disposition: A | Discharge status: Home / Self Care | Payer: MEDICAID

## 2017-09-11 ENCOUNTER — Ambulatory Visit
Admission: RE | Admit: 2017-09-11 | Discharge: 2017-09-11 | Disposition: A | Discharge status: Home / Self Care | Payer: MEDICAID | Attending: Registered" | Admitting: Registered"

## 2017-09-11 DIAGNOSIS — E1142 Type 2 diabetes mellitus with diabetic polyneuropathy: Principal | ICD-10-CM

## 2017-09-11 DIAGNOSIS — L859 Epidermal thickening, unspecified: Secondary | ICD-10-CM

## 2017-09-11 DIAGNOSIS — H9193 Unspecified hearing loss, bilateral: Secondary | ICD-10-CM

## 2017-09-11 DIAGNOSIS — L219 Seborrheic dermatitis, unspecified: Principal | ICD-10-CM

## 2017-09-11 DIAGNOSIS — F315 Bipolar disorder, current episode depressed, severe, with psychotic features: Principal | ICD-10-CM

## 2017-09-11 DIAGNOSIS — L089 Local infection of the skin and subcutaneous tissue, unspecified: Secondary | ICD-10-CM

## 2017-09-11 MED ORDER — FLUOCINONIDE 0.05 % TOPICAL SOLUTION
TOPICAL | 5 refills | 0.00000 days | Status: CP
Start: 2017-09-11 — End: 2017-12-02

## 2017-10-06 ENCOUNTER — Emergency Department
Admission: EM | Admit: 2017-10-06 | Discharge: 2017-10-06 | Disposition: A | Discharge status: Home / Self Care | Payer: MEDICAID | Source: Intra-hospital

## 2017-10-06 ENCOUNTER — Emergency Department
Admission: EM | Admit: 2017-10-06 | Discharge: 2017-10-06 | Disposition: A | Discharge status: Home / Self Care | Source: Intra-hospital | Admitting: Internal Medicine

## 2017-10-06 ENCOUNTER — Emergency Department
Admission: EM | Admit: 2017-10-06 | Discharge: 2017-10-06 | Disposition: A | Discharge status: Home / Self Care | Source: Intra-hospital

## 2017-10-06 DIAGNOSIS — R0602 Shortness of breath: Principal | ICD-10-CM

## 2017-10-10 ENCOUNTER — Emergency Department
Admission: EM | Admit: 2017-10-10 | Discharge: 2017-10-10 | Disposition: A | Discharge status: Home / Self Care | Payer: MEDICAID | Source: Intra-hospital

## 2017-10-15 ENCOUNTER — Ambulatory Visit: Admission: RE | Admit: 2017-10-15 | Discharge: 2017-10-15 | Payer: MEDICAID | Admitting: Internal Medicine

## 2017-10-15 DIAGNOSIS — R079 Chest pain, unspecified: Principal | ICD-10-CM

## 2017-10-15 DIAGNOSIS — L0100 Impetigo, unspecified: Secondary | ICD-10-CM

## 2017-10-15 MED ORDER — VOLTAREN 1 % TOPICAL GEL
Freq: Four times a day (QID) | TOPICAL | 0 refills | 0 days | Status: CP
Start: 2017-10-15 — End: 2018-05-28

## 2017-10-15 MED ORDER — MUPIROCIN 2 % TOPICAL OINTMENT
Freq: Three times a day (TID) | TOPICAL | 1 refills | 0 days | Status: CP
Start: 2017-10-15 — End: 2017-10-22

## 2017-10-23 ENCOUNTER — Ambulatory Visit
Admission: RE | Admit: 2017-10-23 | Discharge: 2017-10-23 | Disposition: A | Discharge status: Home / Self Care | Payer: MEDICAID

## 2017-10-23 ENCOUNTER — Ambulatory Visit
Admission: RE | Admit: 2017-10-23 | Discharge: 2017-10-23 | Disposition: A | Discharge status: Home / Self Care | Payer: MEDICAID | Admitting: Podiatrist

## 2017-10-23 DIAGNOSIS — L853 Xerosis cutis: Secondary | ICD-10-CM

## 2017-10-23 DIAGNOSIS — F319 Bipolar disorder, unspecified: Secondary | ICD-10-CM

## 2017-10-23 DIAGNOSIS — L089 Local infection of the skin and subcutaneous tissue, unspecified: Principal | ICD-10-CM

## 2017-10-23 MED ORDER — AMMONIUM LACTATE 12 % LOTION
Freq: Two times a day (BID) | TOPICAL | 5 refills | 0.00000 days | Status: CP
Start: 2017-10-23 — End: 2018-05-28

## 2017-11-18 MED ORDER — LYRICA 75 MG CAPSULE
ORAL_CAPSULE | 0 refills | 0 days | Status: CP
Start: 2017-11-18 — End: 2018-02-15

## 2017-11-25 ENCOUNTER — Ambulatory Visit: Admit: 2017-11-25 | Discharge: 2017-11-26 | Payer: MEDICAID

## 2017-11-25 DIAGNOSIS — J029 Acute pharyngitis, unspecified: Principal | ICD-10-CM

## 2017-11-25 MED ORDER — BENZONATATE 100 MG CAPSULE
ORAL_CAPSULE | Freq: Three times a day (TID) | ORAL | 1 refills | 0.00000 days | Status: CP | PRN
Start: 2017-11-25 — End: 2017-12-02

## 2017-11-30 MED ORDER — CETIRIZINE 10 MG TABLET
ORAL_TABLET | 2 refills | 0 days | Status: CP
Start: 2017-11-30 — End: 2018-10-04

## 2017-12-02 ENCOUNTER — Ambulatory Visit: Admit: 2017-12-02 | Discharge: 2017-12-03 | Payer: MEDICAID | Attending: Audiologist | Primary: Audiologist

## 2017-12-02 ENCOUNTER — Ambulatory Visit
Admit: 2017-12-02 | Discharge: 2017-12-03 | Payer: MEDICAID | Attending: Cardiovascular Disease | Primary: Cardiovascular Disease

## 2017-12-02 ENCOUNTER — Ambulatory Visit: Admit: 2017-12-02 | Discharge: 2017-12-03 | Payer: MEDICAID

## 2017-12-02 DIAGNOSIS — R0609 Other forms of dyspnea: Secondary | ICD-10-CM

## 2017-12-02 DIAGNOSIS — K769 Liver disease, unspecified: Principal | ICD-10-CM

## 2017-12-02 DIAGNOSIS — H918X9 Other specified hearing loss, unspecified ear: Secondary | ICD-10-CM

## 2017-12-02 DIAGNOSIS — R079 Chest pain, unspecified: Principal | ICD-10-CM

## 2017-12-02 DIAGNOSIS — H9193 Unspecified hearing loss, bilateral: Principal | ICD-10-CM

## 2017-12-14 ENCOUNTER — Ambulatory Visit: Admit: 2017-12-14 | Discharge: 2017-12-14 | Payer: MEDICAID

## 2017-12-14 DIAGNOSIS — R079 Chest pain, unspecified: Principal | ICD-10-CM

## 2017-12-23 ENCOUNTER — Ambulatory Visit: Admit: 2017-12-23 | Discharge: 2017-12-23 | Payer: MEDICAID

## 2017-12-23 DIAGNOSIS — R0609 Other forms of dyspnea: Principal | ICD-10-CM

## 2017-12-31 ENCOUNTER — Ambulatory Visit
Admit: 2017-12-31 | Discharge: 2018-01-01 | Payer: MEDICAID | Attending: Cardiovascular Disease | Primary: Cardiovascular Disease

## 2017-12-31 DIAGNOSIS — R0609 Other forms of dyspnea: Secondary | ICD-10-CM

## 2017-12-31 DIAGNOSIS — M542 Cervicalgia: Principal | ICD-10-CM

## 2017-12-31 DIAGNOSIS — R079 Chest pain, unspecified: Secondary | ICD-10-CM

## 2017-12-31 MED ORDER — ALBUTEROL SULFATE HFA 90 MCG/ACTUATION AEROSOL INHALER
Freq: Four times a day (QID) | RESPIRATORY_TRACT | 3 refills | 0 days | Status: CP | PRN
Start: 2017-12-31 — End: 2018-12-22

## 2018-01-04 MED ORDER — METFORMIN 500 MG TABLET
ORAL_TABLET | Freq: Two times a day (BID) | ORAL | 1 refills | 0 days | Status: CP
Start: 2018-01-04 — End: 2018-05-28

## 2018-01-07 ENCOUNTER — Ambulatory Visit: Admit: 2018-01-07 | Discharge: 2018-01-07 | Disposition: A | Discharge status: Home / Self Care | Payer: MEDICAID

## 2018-01-08 ENCOUNTER — Emergency Department: Admit: 2018-01-08 | Discharge: 2018-01-08 | Disposition: A | Discharge status: Home / Self Care | Payer: MEDICAID

## 2018-01-08 ENCOUNTER — Ambulatory Visit: Admit: 2018-01-08 | Discharge: 2018-01-08 | Disposition: A | Discharge status: Home / Self Care | Payer: MEDICAID

## 2018-01-08 ENCOUNTER — Ambulatory Visit: Admit: 2018-01-08 | Discharge: 2018-01-09 | Payer: MEDICAID

## 2018-01-08 DIAGNOSIS — R1011 Right upper quadrant pain: Principal | ICD-10-CM

## 2018-01-08 DIAGNOSIS — R112 Nausea with vomiting, unspecified: Secondary | ICD-10-CM

## 2018-01-14 MED ORDER — CICLOPIROX 8 % TOPICAL SOLUTION
Freq: Every evening | TOPICAL | 0 refills | 0.00000 days | Status: CP
Start: 2018-01-14 — End: 2018-04-14

## 2018-01-14 MED ORDER — JANUVIA 100 MG TABLET
ORAL_TABLET | 2 refills | 0 days | Status: CP
Start: 2018-01-14 — End: 2018-04-21

## 2018-01-18 MED ORDER — OMEPRAZOLE 40 MG CAPSULE,DELAYED RELEASE
ORAL_CAPSULE | Freq: Every day | ORAL | 3 refills | 0 days | Status: CP
Start: 2018-01-18 — End: 2018-05-28

## 2018-01-20 MED ORDER — LOSARTAN 100 MG TABLET
ORAL_TABLET | Freq: Every day | ORAL | 3 refills | 0 days | Status: CP
Start: 2018-01-20 — End: 2018-05-28

## 2018-01-21 ENCOUNTER — Ambulatory Visit: Admit: 2018-01-21 | Discharge: 2018-02-19 | Payer: MEDICAID | Attending: Mental Health | Primary: Mental Health

## 2018-01-21 DIAGNOSIS — F250 Schizoaffective disorder, bipolar type: Secondary | ICD-10-CM

## 2018-01-21 DIAGNOSIS — F4310 Post-traumatic stress disorder, unspecified: Secondary | ICD-10-CM

## 2018-01-21 DIAGNOSIS — F419 Anxiety disorder, unspecified: Secondary | ICD-10-CM

## 2018-01-21 DIAGNOSIS — F315 Bipolar disorder, current episode depressed, severe, with psychotic features: Secondary | ICD-10-CM

## 2018-01-21 DIAGNOSIS — F603 Borderline personality disorder: Secondary | ICD-10-CM

## 2018-01-21 DIAGNOSIS — F5081 Binge eating disorder: Principal | ICD-10-CM

## 2018-01-25 ENCOUNTER — Ambulatory Visit: Admit: 2018-01-25 | Discharge: 2018-01-26 | Payer: MEDICAID | Attending: Internal Medicine | Primary: Internal Medicine

## 2018-01-25 ENCOUNTER — Ambulatory Visit: Admit: 2018-01-25 | Discharge: 2018-01-26 | Payer: MEDICAID

## 2018-01-25 DIAGNOSIS — K769 Liver disease, unspecified: Principal | ICD-10-CM

## 2018-01-25 DIAGNOSIS — K529 Noninfective gastroenteritis and colitis, unspecified: Principal | ICD-10-CM

## 2018-01-28 ENCOUNTER — Ambulatory Visit: Admit: 2018-01-28 | Discharge: 2018-01-29 | Payer: MEDICAID

## 2018-01-28 DIAGNOSIS — H903 Sensorineural hearing loss, bilateral: Secondary | ICD-10-CM

## 2018-01-28 DIAGNOSIS — H6122 Impacted cerumen, left ear: Secondary | ICD-10-CM

## 2018-01-28 DIAGNOSIS — H918X3 Other specified hearing loss, bilateral: Principal | ICD-10-CM

## 2018-02-15 MED ORDER — GLIPIZIDE 10 MG TABLET
ORAL_TABLET | 0 refills | 0 days | Status: CP
Start: 2018-02-15 — End: 2018-05-20

## 2018-02-15 MED ORDER — HYDROCHLOROTHIAZIDE 25 MG TABLET
ORAL_TABLET | Freq: Every day | ORAL | 0 refills | 0 days | Status: CP
Start: 2018-02-15 — End: 2018-04-12

## 2018-02-15 MED ORDER — MAGNESIUM OXIDE 400 MG (241.3 MG MAGNESIUM) TABLET
ORAL_TABLET | 3 refills | 0 days | Status: CP
Start: 2018-02-15 — End: 2018-03-02

## 2018-02-15 MED ORDER — ATENOLOL 100 MG TABLET
ORAL_TABLET | Freq: Every day | ORAL | 0 refills | 0.00000 days | Status: CP
Start: 2018-02-15 — End: 2018-05-20

## 2018-02-15 MED ORDER — LYRICA 75 MG CAPSULE
ORAL_CAPSULE | 0 refills | 0 days | Status: CP
Start: 2018-02-15 — End: 2018-04-27

## 2018-03-02 ENCOUNTER — Ambulatory Visit: Admit: 2018-03-02 | Discharge: 2018-03-02 | Payer: MEDICAID

## 2018-03-02 DIAGNOSIS — R1011 Right upper quadrant pain: Secondary | ICD-10-CM

## 2018-03-02 DIAGNOSIS — K529 Noninfective gastroenteritis and colitis, unspecified: Principal | ICD-10-CM

## 2018-03-02 DIAGNOSIS — G6289 Other specified polyneuropathies: Secondary | ICD-10-CM

## 2018-03-02 DIAGNOSIS — D519 Vitamin B12 deficiency anemia, unspecified: Secondary | ICD-10-CM

## 2018-03-02 DIAGNOSIS — M542 Cervicalgia: Secondary | ICD-10-CM

## 2018-03-02 DIAGNOSIS — E1142 Type 2 diabetes mellitus with diabetic polyneuropathy: Secondary | ICD-10-CM

## 2018-03-02 DIAGNOSIS — G4733 Obstructive sleep apnea (adult) (pediatric): Secondary | ICD-10-CM

## 2018-03-02 DIAGNOSIS — K219 Gastro-esophageal reflux disease without esophagitis: Secondary | ICD-10-CM

## 2018-03-02 DIAGNOSIS — I10 Essential (primary) hypertension: Secondary | ICD-10-CM

## 2018-03-02 DIAGNOSIS — F315 Bipolar disorder, current episode depressed, severe, with psychotic features: Secondary | ICD-10-CM

## 2018-03-02 MED ORDER — CLOTRIMAZOLE 1 % TOPICAL CREAM
Freq: Two times a day (BID) | TOPICAL | 5 refills | 0.00000 days | Status: CP
Start: 2018-03-02 — End: 2018-05-28

## 2018-03-02 MED ORDER — BLOOD SUGAR DIAGNOSTIC STRIPS
ORAL_STRIP | Freq: Two times a day (BID) | 3 refills | 0.00000 days | Status: CP
Start: 2018-03-02 — End: 2018-05-31

## 2018-03-02 MED ORDER — BLOOD-GLUCOSE METER KIT
0 refills | 0 days | Status: CP
Start: 2018-03-02 — End: 2018-12-22

## 2018-04-13 MED ORDER — RANITIDINE 300 MG TABLET
ORAL_TABLET | Freq: Every evening | ORAL | 3 refills | 0.00000 days | Status: CP
Start: 2018-04-13 — End: 2018-05-28

## 2018-04-13 MED ORDER — HYDROCHLOROTHIAZIDE 25 MG TABLET
ORAL_TABLET | Freq: Every day | ORAL | 0 refills | 0.00000 days | Status: CP
Start: 2018-04-13 — End: 2018-05-28

## 2018-04-21 MED ORDER — JANUVIA 100 MG TABLET
ORAL_TABLET | 2 refills | 0 days | Status: CP
Start: 2018-04-21 — End: 2018-05-28

## 2018-04-27 MED ORDER — PREGABALIN 75 MG CAPSULE
ORAL_CAPSULE | Freq: Three times a day (TID) | ORAL | 0 refills | 0 days | Status: CP
Start: 2018-04-27 — End: 2018-09-17

## 2018-05-21 MED ORDER — ATENOLOL 100 MG TABLET
ORAL_TABLET | Freq: Every day | ORAL | 0 refills | 0 days | Status: CP
Start: 2018-05-21 — End: 2018-05-28

## 2018-05-21 MED ORDER — GLIPIZIDE 10 MG TABLET
ORAL_TABLET | 0 refills | 0 days | Status: CP
Start: 2018-05-21 — End: 2018-05-28

## 2018-05-28 ENCOUNTER — Ambulatory Visit: Admit: 2018-05-28 | Discharge: 2018-05-29 | Payer: MEDICAID

## 2018-05-28 DIAGNOSIS — E1142 Type 2 diabetes mellitus with diabetic polyneuropathy: Secondary | ICD-10-CM

## 2018-05-28 DIAGNOSIS — G6289 Other specified polyneuropathies: Secondary | ICD-10-CM

## 2018-05-28 DIAGNOSIS — B354 Tinea corporis: Secondary | ICD-10-CM

## 2018-05-28 DIAGNOSIS — I10 Essential (primary) hypertension: Secondary | ICD-10-CM

## 2018-05-28 DIAGNOSIS — K219 Gastro-esophageal reflux disease without esophagitis: Principal | ICD-10-CM

## 2018-05-28 MED ORDER — RANITIDINE 300 MG TABLET
ORAL_TABLET | Freq: Every evening | ORAL | 3 refills | 0.00000 days | Status: CP
Start: 2018-05-28 — End: 2018-12-22

## 2018-05-28 MED ORDER — LOSARTAN 100 MG TABLET
ORAL_TABLET | Freq: Every day | ORAL | 3 refills | 0 days | Status: CP
Start: 2018-05-28 — End: 2018-12-22

## 2018-05-28 MED ORDER — HYDROCHLOROTHIAZIDE 25 MG TABLET
ORAL_TABLET | Freq: Every day | ORAL | 1 refills | 0.00000 days | Status: CP
Start: 2018-05-28 — End: 2018-12-22

## 2018-05-28 MED ORDER — VOLTAREN 1 % TOPICAL GEL
Freq: Four times a day (QID) | TOPICAL | 11 refills | 0 days | Status: CP
Start: 2018-05-28 — End: 2018-12-22

## 2018-05-28 MED ORDER — SITAGLIPTIN 100 MG TABLET
ORAL_TABLET | Freq: Every day | ORAL | 2 refills | 0.00000 days | Status: CP
Start: 2018-05-28 — End: 2018-12-22

## 2018-05-28 MED ORDER — GLIPIZIDE 10 MG TABLET
ORAL_TABLET | Freq: Two times a day (BID) | ORAL | 1 refills | 0.00000 days | Status: CP
Start: 2018-05-28 — End: 2018-12-22

## 2018-05-28 MED ORDER — METFORMIN 500 MG TABLET
ORAL_TABLET | Freq: Two times a day (BID) | ORAL | 1 refills | 0 days | Status: CP
Start: 2018-05-28 — End: 2018-12-15

## 2018-05-28 MED ORDER — OMEPRAZOLE 40 MG CAPSULE,DELAYED RELEASE
ORAL_CAPSULE | Freq: Every day | ORAL | 3 refills | 0.00000 days | Status: CP
Start: 2018-05-28 — End: 2018-12-22

## 2018-05-28 MED ORDER — ATENOLOL 100 MG TABLET
ORAL_TABLET | Freq: Every day | ORAL | 1 refills | 0 days | Status: CP
Start: 2018-05-28 — End: 2018-12-22

## 2018-06-28 MED ORDER — NORTREL 0.5/35 (28) 0.5 MG-35 MCG TABLET
ORAL_TABLET | 3 refills | 0 days | Status: CP
Start: 2018-06-28 — End: 2018-12-22

## 2018-08-05 ENCOUNTER — Ambulatory Visit: Admit: 2018-08-05 | Discharge: 2018-08-06 | Payer: MEDICAID

## 2018-08-05 DIAGNOSIS — B354 Tinea corporis: Secondary | ICD-10-CM

## 2018-08-05 DIAGNOSIS — R21 Rash and other nonspecific skin eruption: Secondary | ICD-10-CM

## 2018-08-05 DIAGNOSIS — I10 Essential (primary) hypertension: Secondary | ICD-10-CM

## 2018-08-05 DIAGNOSIS — E282 Polycystic ovarian syndrome: Secondary | ICD-10-CM

## 2018-08-05 DIAGNOSIS — E1142 Type 2 diabetes mellitus with diabetic polyneuropathy: Principal | ICD-10-CM

## 2018-09-20 MED ORDER — HYDROCHLOROTHIAZIDE 25 MG TABLET
ORAL_TABLET | Freq: Every day | ORAL | 0 refills | 0.00000 days | Status: CP
Start: 2018-09-20 — End: 2018-12-22

## 2018-09-20 MED ORDER — PREGABALIN 75 MG CAPSULE
ORAL_CAPSULE | Freq: Three times a day (TID) | ORAL | 0 refills | 0.00000 days | Status: CP
Start: 2018-09-20 — End: 2018-12-22

## 2018-10-04 MED ORDER — CETIRIZINE 10 MG TABLET
ORAL_TABLET | 8 refills | 0 days | Status: CP
Start: 2018-10-04 — End: 2018-12-22

## 2018-12-15 MED ORDER — METFORMIN 500 MG TABLET
ORAL_TABLET | 0 refills | 0 days | Status: CP
Start: 2018-12-15 — End: 2018-12-22

## 2018-12-22 ENCOUNTER — Ambulatory Visit: Admit: 2018-12-22 | Discharge: 2018-12-23 | Payer: MEDICAID

## 2018-12-22 DIAGNOSIS — I10 Essential (primary) hypertension: Secondary | ICD-10-CM

## 2018-12-22 DIAGNOSIS — R21 Rash and other nonspecific skin eruption: Secondary | ICD-10-CM

## 2018-12-22 DIAGNOSIS — Z9189 Other specified personal risk factors, not elsewhere classified: Secondary | ICD-10-CM

## 2018-12-22 DIAGNOSIS — Z79899 Other long term (current) drug therapy: Secondary | ICD-10-CM

## 2018-12-22 DIAGNOSIS — E1142 Type 2 diabetes mellitus with diabetic polyneuropathy: Secondary | ICD-10-CM

## 2018-12-22 DIAGNOSIS — F315 Bipolar disorder, current episode depressed, severe, with psychotic features: Secondary | ICD-10-CM

## 2018-12-22 DIAGNOSIS — K219 Gastro-esophageal reflux disease without esophagitis: Principal | ICD-10-CM

## 2018-12-22 DIAGNOSIS — M542 Cervicalgia: Secondary | ICD-10-CM

## 2018-12-22 DIAGNOSIS — G6289 Other specified polyneuropathies: Secondary | ICD-10-CM

## 2018-12-22 MED ORDER — FAMOTIDINE 20 MG TABLET
ORAL_TABLET | Freq: Every evening | ORAL | 3 refills | 0.00000 days | Status: CP
Start: 2018-12-22 — End: 2018-12-28

## 2018-12-22 MED ORDER — SITAGLIPTIN 100 MG TABLET
ORAL_TABLET | Freq: Every day | ORAL | 2 refills | 0 days | Status: CP
Start: 2018-12-22 — End: 2019-01-24

## 2018-12-22 MED ORDER — ATENOLOL 100 MG TABLET
ORAL_TABLET | Freq: Every day | ORAL | 3 refills | 0 days | Status: CP
Start: 2018-12-22 — End: ?

## 2018-12-22 MED ORDER — CETIRIZINE 10 MG TABLET
ORAL_TABLET | Freq: Every day | ORAL | 11 refills | 0 days | Status: CP
Start: 2018-12-22 — End: ?

## 2018-12-22 MED ORDER — PREGABALIN 75 MG CAPSULE
ORAL_CAPSULE | Freq: Three times a day (TID) | ORAL | 0 refills | 0.00000 days | Status: CP
Start: 2018-12-22 — End: 2019-04-15

## 2018-12-22 MED ORDER — LOSARTAN 100 MG TABLET
ORAL_TABLET | Freq: Every day | ORAL | 3 refills | 0.00000 days | Status: CP
Start: 2018-12-22 — End: ?

## 2018-12-22 MED ORDER — HYDROCHLOROTHIAZIDE 25 MG TABLET
ORAL_TABLET | Freq: Every day | ORAL | 3 refills | 0.00000 days | Status: CP
Start: 2018-12-22 — End: ?

## 2018-12-22 MED ORDER — METFORMIN 500 MG TABLET
ORAL_TABLET | Freq: Two times a day (BID) | ORAL | 3 refills | 0 days | Status: CP
Start: 2018-12-22 — End: ?

## 2018-12-22 MED ORDER — VOLTAREN 1 % TOPICAL GEL
Freq: Four times a day (QID) | TOPICAL | 11 refills | 0 days | Status: CP
Start: 2018-12-22 — End: 2019-12-22

## 2018-12-22 MED ORDER — OMEPRAZOLE 40 MG CAPSULE,DELAYED RELEASE
ORAL_CAPSULE | Freq: Every day | ORAL | 3 refills | 0.00000 days | Status: CP
Start: 2018-12-22 — End: 2019-01-06

## 2018-12-22 MED ORDER — MELOXICAM 7.5 MG TABLET
ORAL_TABLET | Freq: Every day | ORAL | 11 refills | 0 days | Status: CP
Start: 2018-12-22 — End: 2019-12-22

## 2018-12-22 MED ORDER — GLIPIZIDE 10 MG TABLET
ORAL_TABLET | Freq: Two times a day (BID) | ORAL | 3 refills | 0 days | Status: CP
Start: 2018-12-22 — End: ?

## 2018-12-28 MED ORDER — FAMOTIDINE 20 MG TABLET
ORAL_TABLET | Freq: Every evening | ORAL | 3 refills | 0.00000 days | Status: CP
Start: 2018-12-28 — End: 2019-01-18

## 2019-01-06 MED ORDER — PANTOPRAZOLE 40 MG TABLET,DELAYED RELEASE
ORAL_TABLET | Freq: Every day | ORAL | 1 refills | 0.00000 days | Status: CP
Start: 2019-01-06 — End: 2019-03-21

## 2019-01-18 ENCOUNTER — Ambulatory Visit: Admit: 2019-01-18 | Discharge: 2019-01-19 | Payer: MEDICAID

## 2019-01-18 DIAGNOSIS — D519 Vitamin B12 deficiency anemia, unspecified: Principal | ICD-10-CM

## 2019-01-18 DIAGNOSIS — IMO0002 Abnormal Pap smear: Principal | ICD-10-CM

## 2019-01-18 DIAGNOSIS — F319 Bipolar disorder, unspecified: Principal | ICD-10-CM

## 2019-01-18 DIAGNOSIS — G43909 Migraine, unspecified, not intractable, without status migrainosus: Principal | ICD-10-CM

## 2019-01-18 DIAGNOSIS — E119 Type 2 diabetes mellitus without complications: Principal | ICD-10-CM

## 2019-01-18 DIAGNOSIS — N390 Urinary tract infection, site not specified: Principal | ICD-10-CM

## 2019-01-18 DIAGNOSIS — M542 Cervicalgia: Principal | ICD-10-CM

## 2019-01-18 DIAGNOSIS — Z7289 Other problems related to lifestyle: Principal | ICD-10-CM

## 2019-01-18 DIAGNOSIS — R0602 Shortness of breath: Principal | ICD-10-CM

## 2019-01-18 DIAGNOSIS — R1011 Right upper quadrant pain: Principal | ICD-10-CM

## 2019-01-18 DIAGNOSIS — K219 Gastro-esophageal reflux disease without esophagitis: Principal | ICD-10-CM

## 2019-01-18 DIAGNOSIS — E669 Obesity, unspecified: Principal | ICD-10-CM

## 2019-01-18 DIAGNOSIS — F315 Bipolar disorder, current episode depressed, severe, with psychotic features: Principal | ICD-10-CM

## 2019-01-18 DIAGNOSIS — F603 Borderline personality disorder: Principal | ICD-10-CM

## 2019-01-18 DIAGNOSIS — F419 Anxiety disorder, unspecified: Principal | ICD-10-CM

## 2019-01-18 DIAGNOSIS — K760 Fatty (change of) liver, not elsewhere classified: Principal | ICD-10-CM

## 2019-01-18 DIAGNOSIS — J984 Other disorders of lung: Principal | ICD-10-CM

## 2019-01-18 DIAGNOSIS — F329 Major depressive disorder, single episode, unspecified: Principal | ICD-10-CM

## 2019-01-18 DIAGNOSIS — R079 Chest pain, unspecified: Principal | ICD-10-CM

## 2019-01-18 DIAGNOSIS — E282 Polycystic ovarian syndrome: Principal | ICD-10-CM

## 2019-01-18 DIAGNOSIS — F259 Schizoaffective disorder, unspecified: Principal | ICD-10-CM

## 2019-01-18 DIAGNOSIS — G629 Polyneuropathy, unspecified: Principal | ICD-10-CM

## 2019-01-18 DIAGNOSIS — I10 Essential (primary) hypertension: Principal | ICD-10-CM

## 2019-01-18 DIAGNOSIS — R87619 Unspecified abnormal cytological findings in specimens from cervix uteri: Principal | ICD-10-CM

## 2019-01-18 DIAGNOSIS — R45851 Suicidal ideations: Principal | ICD-10-CM

## 2019-01-18 DIAGNOSIS — F4310 Post-traumatic stress disorder, unspecified: Principal | ICD-10-CM

## 2019-01-18 MED ORDER — ALBUTEROL SULFATE HFA 90 MCG/ACTUATION AEROSOL INHALER
Freq: Four times a day (QID) | RESPIRATORY_TRACT | 1 refills | 0.00000 days | Status: CP | PRN
Start: 2019-01-18 — End: 2020-01-18

## 2019-01-24 MED ORDER — JANUVIA 100 MG TABLET
ORAL_TABLET | 2 refills | 0 days | Status: CP
Start: 2019-01-24 — End: ?

## 2019-03-15 ENCOUNTER — Telehealth: Admit: 2019-03-15 | Discharge: 2019-03-16 | Payer: MEDICAID | Attending: Pulmonary Disease | Primary: Pulmonary Disease

## 2019-03-15 ENCOUNTER — Ambulatory Visit: Admit: 2019-03-15 | Discharge: 2019-03-16 | Payer: MEDICAID

## 2019-03-15 DIAGNOSIS — R0602 Shortness of breath: Principal | ICD-10-CM

## 2019-03-21 MED ORDER — PANTOPRAZOLE 40 MG TABLET,DELAYED RELEASE
ORAL_TABLET | 1 refills | 0 days | Status: CP
Start: 2019-03-21 — End: 2019-03-22

## 2019-03-22 DIAGNOSIS — M542 Cervicalgia: Secondary | ICD-10-CM

## 2019-03-22 DIAGNOSIS — F315 Bipolar disorder, current episode depressed, severe, with psychotic features: Principal | ICD-10-CM

## 2019-03-22 DIAGNOSIS — R0602 Shortness of breath: Secondary | ICD-10-CM

## 2019-03-22 DIAGNOSIS — E1142 Type 2 diabetes mellitus with diabetic polyneuropathy: Secondary | ICD-10-CM

## 2019-03-22 MED ORDER — PANTOPRAZOLE 40 MG TABLET,DELAYED RELEASE
ORAL_TABLET | Freq: Every day | ORAL | 1 refills | 0 days | Status: CP
Start: 2019-03-22 — End: ?

## 2019-03-23 MED ORDER — BLOOD-GLUCOSE METER KIT WRAPPER
0 refills | 0 days | Status: CP
Start: 2019-03-23 — End: ?

## 2019-03-23 MED ORDER — BLOOD SUGAR DIAGNOSTIC STRIPS
ORAL_STRIP | 3 refills | 0 days | Status: CP
Start: 2019-03-23 — End: ?

## 2019-03-23 MED ORDER — LANCETS
3 refills | 0 days | Status: CP
Start: 2019-03-23 — End: ?

## 2019-03-29 ENCOUNTER — Telehealth
Admit: 2019-03-29 | Discharge: 2019-03-30 | Payer: MEDICAID | Attending: Obstetrics & Gynecology | Primary: Obstetrics & Gynecology

## 2019-03-29 DIAGNOSIS — Z3169 Encounter for other general counseling and advice on procreation: Principal | ICD-10-CM

## 2019-03-29 DIAGNOSIS — F315 Bipolar disorder, current episode depressed, severe, with psychotic features: Secondary | ICD-10-CM

## 2019-03-31 ENCOUNTER — Ambulatory Visit: Admit: 2019-03-31 | Discharge: 2019-03-31 | Disposition: A | Discharge status: Home / Self Care | Payer: MEDICAID

## 2019-03-31 ENCOUNTER — Emergency Department: Admit: 2019-03-31 | Discharge: 2019-03-31 | Disposition: A | Discharge status: Home / Self Care | Payer: MEDICAID

## 2019-03-31 DIAGNOSIS — R109 Unspecified abdominal pain: Principal | ICD-10-CM

## 2019-04-07 ENCOUNTER — Telehealth
Admit: 2019-04-07 | Discharge: 2019-04-08 | Payer: MEDICAID | Attending: Physician Assistant | Primary: Physician Assistant

## 2019-04-15 MED ORDER — PREGABALIN 75 MG CAPSULE
ORAL_CAPSULE | 2 refills | 0 days | Status: CP
Start: 2019-04-15 — End: ?

## 2019-04-19 MED ORDER — VALSARTAN 160 MG TABLET
ORAL_TABLET | Freq: Every day | ORAL | 2 refills | 0 days | Status: CP
Start: 2019-04-19 — End: 2020-04-18

## 2019-05-05 ENCOUNTER — Telehealth
Admit: 2019-05-05 | Discharge: 2019-05-06 | Payer: MEDICAID | Attending: Physician Assistant | Primary: Physician Assistant

## 2019-05-05 DIAGNOSIS — Z6841 Body Mass Index (BMI) 40.0 and over, adult: Secondary | ICD-10-CM

## 2019-05-09 ENCOUNTER — Ambulatory Visit: Admit: 2019-05-09 | Discharge: 2019-05-09 | Payer: MEDICAID

## 2019-05-09 DIAGNOSIS — R0602 Shortness of breath: Principal | ICD-10-CM

## 2019-05-13 ENCOUNTER — Institutional Professional Consult (permissible substitution): Admit: 2019-05-13 | Discharge: 2019-05-14 | Payer: MEDICAID

## 2019-05-13 DIAGNOSIS — R0602 Shortness of breath: Principal | ICD-10-CM

## 2019-05-23 ENCOUNTER — Ambulatory Visit
Admit: 2019-05-23 | Discharge: 2019-05-24 | Payer: MEDICAID | Attending: Student in an Organized Health Care Education/Training Program | Primary: Student in an Organized Health Care Education/Training Program

## 2019-05-23 DIAGNOSIS — R102 Pelvic and perineal pain: Secondary | ICD-10-CM

## 2019-05-23 DIAGNOSIS — R1900 Intra-abdominal and pelvic swelling, mass and lump, unspecified site: Principal | ICD-10-CM

## 2019-05-24 MED ORDER — FLUCONAZOLE 150 MG TABLET
ORAL_TABLET | Freq: Once | ORAL | 0 refills | 0 days | Status: CP
Start: 2019-05-24 — End: 2019-05-24

## 2019-05-26 MED ORDER — MICONAZOLE NITRATE 2 % VAGINAL CREAM
Freq: Every day | VAGINAL | 0 refills | 0.00000 days | Status: CP
Start: 2019-05-26 — End: 2019-06-02

## 2019-06-08 ENCOUNTER — Telehealth
Admit: 2019-06-08 | Discharge: 2019-06-09 | Payer: MEDICAID | Attending: Physician Assistant | Primary: Physician Assistant

## 2019-06-08 DIAGNOSIS — Z6841 Body Mass Index (BMI) 40.0 and over, adult: Secondary | ICD-10-CM

## 2019-06-23 ENCOUNTER — Telehealth: Admit: 2019-06-23 | Discharge: 2019-06-24 | Payer: MEDICAID

## 2019-06-23 DIAGNOSIS — R300 Dysuria: Secondary | ICD-10-CM

## 2019-06-23 DIAGNOSIS — E1142 Type 2 diabetes mellitus with diabetic polyneuropathy: Principal | ICD-10-CM

## 2019-06-23 DIAGNOSIS — R1900 Intra-abdominal and pelvic swelling, mass and lump, unspecified site: Secondary | ICD-10-CM

## 2019-06-23 DIAGNOSIS — G6289 Other specified polyneuropathies: Secondary | ICD-10-CM

## 2019-06-23 DIAGNOSIS — F315 Bipolar disorder, current episode depressed, severe, with psychotic features: Secondary | ICD-10-CM

## 2019-06-23 DIAGNOSIS — K219 Gastro-esophageal reflux disease without esophagitis: Secondary | ICD-10-CM

## 2019-06-23 DIAGNOSIS — I10 Essential (primary) hypertension: Secondary | ICD-10-CM

## 2019-06-27 ENCOUNTER — Telehealth: Admit: 2019-06-27 | Discharge: 2019-06-28 | Payer: MEDICAID | Attending: Surgery | Primary: Surgery

## 2019-06-28 ENCOUNTER — Ambulatory Visit: Admit: 2019-06-28 | Discharge: 2019-06-29 | Payer: MEDICAID

## 2019-06-28 DIAGNOSIS — E1142 Type 2 diabetes mellitus with diabetic polyneuropathy: Principal | ICD-10-CM

## 2019-06-28 DIAGNOSIS — R300 Dysuria: Secondary | ICD-10-CM

## 2019-06-28 MED ORDER — NITROFURANTOIN MONOHYDRATE/MACROCRYSTALS 100 MG CAPSULE
ORAL_CAPSULE | Freq: Two times a day (BID) | ORAL | 0 refills | 7 days | Status: CP
Start: 2019-06-28 — End: 2019-07-05

## 2019-07-08 ENCOUNTER — Ambulatory Visit
Admit: 2019-07-08 | Discharge: 2019-07-08 | Discharge status: Against Medical Advice | Payer: MEDICAID | Attending: Emergency Medicine

## 2019-07-19 ENCOUNTER — Telehealth: Admit: 2019-07-19 | Discharge: 2019-07-19 | Payer: MEDICAID | Attending: Family | Primary: Family

## 2019-07-19 ENCOUNTER — Telehealth: Admit: 2019-07-19 | Discharge: 2019-07-19 | Payer: MEDICAID | Attending: Registered" | Primary: Registered"

## 2019-07-19 DIAGNOSIS — Z7189 Other specified counseling: Secondary | ICD-10-CM

## 2019-07-19 DIAGNOSIS — Z79899 Other long term (current) drug therapy: Secondary | ICD-10-CM

## 2019-07-19 DIAGNOSIS — R5383 Other fatigue: Secondary | ICD-10-CM

## 2019-07-19 DIAGNOSIS — F419 Anxiety disorder, unspecified: Secondary | ICD-10-CM

## 2019-07-19 DIAGNOSIS — Z6841 Body Mass Index (BMI) 40.0 and over, adult: Secondary | ICD-10-CM

## 2019-07-19 DIAGNOSIS — Z87891 Personal history of nicotine dependence: Secondary | ICD-10-CM

## 2019-07-19 DIAGNOSIS — K219 Gastro-esophageal reflux disease without esophagitis: Secondary | ICD-10-CM

## 2019-07-19 DIAGNOSIS — Z888 Allergy status to other drugs, medicaments and biological substances status: Secondary | ICD-10-CM

## 2019-07-19 DIAGNOSIS — Z91018 Allergy to other foods: Secondary | ICD-10-CM

## 2019-07-19 DIAGNOSIS — I10 Essential (primary) hypertension: Secondary | ICD-10-CM

## 2019-07-19 DIAGNOSIS — E1142 Type 2 diabetes mellitus with diabetic polyneuropathy: Secondary | ICD-10-CM

## 2019-07-21 MED ORDER — PREGABALIN 75 MG CAPSULE
ORAL_CAPSULE | 2 refills | 0 days | Status: CP
Start: 2019-07-21 — End: 2019-07-21

## 2019-07-21 MED ORDER — VALSARTAN 160 MG TABLET
ORAL_TABLET | 2 refills | 0 days | Status: CP
Start: 2019-07-21 — End: 2019-07-21

## 2019-07-22 MED ORDER — PREGABALIN 75 MG CAPSULE
ORAL_CAPSULE | Freq: Three times a day (TID) | ORAL | 0 refills | 30 days | Status: SS
Start: 2019-07-22 — End: 2019-08-05

## 2019-07-22 MED ORDER — VALSARTAN 160 MG TABLET
ORAL_TABLET | Freq: Every day | ORAL | 2 refills | 30.00000 days | Status: CP
Start: 2019-07-22 — End: ?

## 2019-08-02 ENCOUNTER — Emergency Department: Admit: 2019-08-02 | Discharge: 2019-08-03 | Disposition: A | Discharge status: Home / Self Care | Payer: MEDICAID

## 2019-08-02 ENCOUNTER — Ambulatory Visit: Admit: 2019-08-02 | Discharge: 2019-08-03 | Disposition: A | Discharge status: Home / Self Care | Payer: MEDICAID

## 2019-08-02 DIAGNOSIS — Z20828 Contact with and (suspected) exposure to other viral communicable diseases: Secondary | ICD-10-CM

## 2019-08-02 DIAGNOSIS — R0781 Pleurodynia: Secondary | ICD-10-CM

## 2019-08-02 DIAGNOSIS — R6889 Other general symptoms and signs: Secondary | ICD-10-CM

## 2019-08-02 DIAGNOSIS — Z87891 Personal history of nicotine dependence: Secondary | ICD-10-CM

## 2019-08-02 DIAGNOSIS — Z7984 Long term (current) use of oral hypoglycemic drugs: Secondary | ICD-10-CM

## 2019-08-02 DIAGNOSIS — I10 Essential (primary) hypertension: Secondary | ICD-10-CM

## 2019-08-02 DIAGNOSIS — R079 Chest pain, unspecified: Secondary | ICD-10-CM

## 2019-08-02 DIAGNOSIS — E1142 Type 2 diabetes mellitus with diabetic polyneuropathy: Secondary | ICD-10-CM

## 2019-08-04 ENCOUNTER — Ambulatory Visit
Admit: 2019-08-04 | Discharge: 2019-08-08 | Disposition: A | Discharge status: Home Health | Payer: MEDICAID | Admitting: Infectious Disease

## 2019-08-04 DIAGNOSIS — Z794 Long term (current) use of insulin: Secondary | ICD-10-CM

## 2019-08-04 DIAGNOSIS — F419 Anxiety disorder, unspecified: Secondary | ICD-10-CM

## 2019-08-04 DIAGNOSIS — R300 Dysuria: Secondary | ICD-10-CM

## 2019-08-04 DIAGNOSIS — Z91018 Allergy to other foods: Secondary | ICD-10-CM

## 2019-08-04 DIAGNOSIS — D519 Vitamin B12 deficiency anemia, unspecified: Secondary | ICD-10-CM

## 2019-08-04 DIAGNOSIS — R0600 Dyspnea, unspecified: Secondary | ICD-10-CM

## 2019-08-04 DIAGNOSIS — F4310 Post-traumatic stress disorder, unspecified: Secondary | ICD-10-CM

## 2019-08-04 DIAGNOSIS — F603 Borderline personality disorder: Secondary | ICD-10-CM

## 2019-08-04 DIAGNOSIS — E1142 Type 2 diabetes mellitus with diabetic polyneuropathy: Secondary | ICD-10-CM

## 2019-08-04 DIAGNOSIS — Z20828 Contact with and (suspected) exposure to other viral communicable diseases: Secondary | ICD-10-CM

## 2019-08-04 DIAGNOSIS — R0602 Shortness of breath: Secondary | ICD-10-CM

## 2019-08-04 DIAGNOSIS — J9601 Acute respiratory failure with hypoxia: Secondary | ICD-10-CM

## 2019-08-04 DIAGNOSIS — K589 Irritable bowel syndrome without diarrhea: Secondary | ICD-10-CM

## 2019-08-04 DIAGNOSIS — F319 Bipolar disorder, unspecified: Secondary | ICD-10-CM

## 2019-08-04 DIAGNOSIS — I10 Essential (primary) hypertension: Secondary | ICD-10-CM

## 2019-08-04 DIAGNOSIS — D134 Benign neoplasm of liver: Secondary | ICD-10-CM

## 2019-08-04 DIAGNOSIS — K760 Fatty (change of) liver, not elsewhere classified: Secondary | ICD-10-CM

## 2019-08-04 DIAGNOSIS — E662 Morbid (severe) obesity with alveolar hypoventilation: Secondary | ICD-10-CM

## 2019-08-04 DIAGNOSIS — Z6841 Body Mass Index (BMI) 40.0 and over, adult: Secondary | ICD-10-CM

## 2019-08-04 DIAGNOSIS — D509 Iron deficiency anemia, unspecified: Secondary | ICD-10-CM

## 2019-08-04 DIAGNOSIS — Z87440 Personal history of urinary (tract) infections: Secondary | ICD-10-CM

## 2019-08-04 DIAGNOSIS — Z79899 Other long term (current) drug therapy: Secondary | ICD-10-CM

## 2019-08-04 DIAGNOSIS — K219 Gastro-esophageal reflux disease without esophagitis: Secondary | ICD-10-CM

## 2019-08-04 DIAGNOSIS — Z87891 Personal history of nicotine dependence: Secondary | ICD-10-CM

## 2019-08-04 DIAGNOSIS — J810 Acute pulmonary edema: Secondary | ICD-10-CM

## 2019-08-04 MED ORDER — FERROUS SULFATE 325 MG (65 MG IRON) TABLET
ORAL_TABLET | Freq: Every day | ORAL | 3 refills | 90 days | Status: CP
Start: 2019-08-04 — End: 2020-08-03

## 2019-08-05 DIAGNOSIS — Z79899 Other long term (current) drug therapy: Secondary | ICD-10-CM

## 2019-08-05 DIAGNOSIS — D134 Benign neoplasm of liver: Secondary | ICD-10-CM

## 2019-08-05 DIAGNOSIS — E662 Morbid (severe) obesity with alveolar hypoventilation: Secondary | ICD-10-CM

## 2019-08-05 DIAGNOSIS — R0600 Dyspnea, unspecified: Secondary | ICD-10-CM

## 2019-08-05 DIAGNOSIS — E1142 Type 2 diabetes mellitus with diabetic polyneuropathy: Secondary | ICD-10-CM

## 2019-08-05 DIAGNOSIS — F419 Anxiety disorder, unspecified: Secondary | ICD-10-CM

## 2019-08-05 DIAGNOSIS — K760 Fatty (change of) liver, not elsewhere classified: Secondary | ICD-10-CM

## 2019-08-05 DIAGNOSIS — R300 Dysuria: Secondary | ICD-10-CM

## 2019-08-05 DIAGNOSIS — F4310 Post-traumatic stress disorder, unspecified: Secondary | ICD-10-CM

## 2019-08-05 DIAGNOSIS — F319 Bipolar disorder, unspecified: Secondary | ICD-10-CM

## 2019-08-05 DIAGNOSIS — D509 Iron deficiency anemia, unspecified: Secondary | ICD-10-CM

## 2019-08-05 DIAGNOSIS — K219 Gastro-esophageal reflux disease without esophagitis: Secondary | ICD-10-CM

## 2019-08-05 DIAGNOSIS — J9601 Acute respiratory failure with hypoxia: Secondary | ICD-10-CM

## 2019-08-05 DIAGNOSIS — Z87440 Personal history of urinary (tract) infections: Secondary | ICD-10-CM

## 2019-08-05 DIAGNOSIS — K589 Irritable bowel syndrome without diarrhea: Secondary | ICD-10-CM

## 2019-08-05 DIAGNOSIS — I10 Essential (primary) hypertension: Secondary | ICD-10-CM

## 2019-08-05 DIAGNOSIS — Z91018 Allergy to other foods: Secondary | ICD-10-CM

## 2019-08-05 DIAGNOSIS — F603 Borderline personality disorder: Secondary | ICD-10-CM

## 2019-08-05 DIAGNOSIS — Z794 Long term (current) use of insulin: Secondary | ICD-10-CM

## 2019-08-05 DIAGNOSIS — Z6841 Body Mass Index (BMI) 40.0 and over, adult: Secondary | ICD-10-CM

## 2019-08-05 DIAGNOSIS — D519 Vitamin B12 deficiency anemia, unspecified: Secondary | ICD-10-CM

## 2019-08-05 DIAGNOSIS — Z87891 Personal history of nicotine dependence: Secondary | ICD-10-CM

## 2019-08-05 DIAGNOSIS — Z20828 Contact with and (suspected) exposure to other viral communicable diseases: Secondary | ICD-10-CM

## 2019-08-05 DIAGNOSIS — J810 Acute pulmonary edema: Secondary | ICD-10-CM

## 2019-08-06 DIAGNOSIS — Z6841 Body Mass Index (BMI) 40.0 and over, adult: Secondary | ICD-10-CM

## 2019-08-06 DIAGNOSIS — Z91018 Allergy to other foods: Secondary | ICD-10-CM

## 2019-08-06 DIAGNOSIS — Z87440 Personal history of urinary (tract) infections: Secondary | ICD-10-CM

## 2019-08-06 DIAGNOSIS — I10 Essential (primary) hypertension: Secondary | ICD-10-CM

## 2019-08-06 DIAGNOSIS — F4310 Post-traumatic stress disorder, unspecified: Secondary | ICD-10-CM

## 2019-08-06 DIAGNOSIS — R0600 Dyspnea, unspecified: Secondary | ICD-10-CM

## 2019-08-06 DIAGNOSIS — R300 Dysuria: Secondary | ICD-10-CM

## 2019-08-06 DIAGNOSIS — K760 Fatty (change of) liver, not elsewhere classified: Secondary | ICD-10-CM

## 2019-08-06 DIAGNOSIS — E662 Morbid (severe) obesity with alveolar hypoventilation: Secondary | ICD-10-CM

## 2019-08-06 DIAGNOSIS — K589 Irritable bowel syndrome without diarrhea: Secondary | ICD-10-CM

## 2019-08-06 DIAGNOSIS — J9601 Acute respiratory failure with hypoxia: Secondary | ICD-10-CM

## 2019-08-06 DIAGNOSIS — D509 Iron deficiency anemia, unspecified: Secondary | ICD-10-CM

## 2019-08-06 DIAGNOSIS — Z794 Long term (current) use of insulin: Secondary | ICD-10-CM

## 2019-08-06 DIAGNOSIS — Z79899 Other long term (current) drug therapy: Secondary | ICD-10-CM

## 2019-08-06 DIAGNOSIS — J810 Acute pulmonary edema: Secondary | ICD-10-CM

## 2019-08-06 DIAGNOSIS — Z87891 Personal history of nicotine dependence: Secondary | ICD-10-CM

## 2019-08-06 DIAGNOSIS — F603 Borderline personality disorder: Secondary | ICD-10-CM

## 2019-08-06 DIAGNOSIS — Z20828 Contact with and (suspected) exposure to other viral communicable diseases: Secondary | ICD-10-CM

## 2019-08-06 DIAGNOSIS — E1142 Type 2 diabetes mellitus with diabetic polyneuropathy: Secondary | ICD-10-CM

## 2019-08-06 DIAGNOSIS — K219 Gastro-esophageal reflux disease without esophagitis: Secondary | ICD-10-CM

## 2019-08-06 DIAGNOSIS — F419 Anxiety disorder, unspecified: Secondary | ICD-10-CM

## 2019-08-06 DIAGNOSIS — F319 Bipolar disorder, unspecified: Secondary | ICD-10-CM

## 2019-08-06 DIAGNOSIS — D519 Vitamin B12 deficiency anemia, unspecified: Secondary | ICD-10-CM

## 2019-08-06 DIAGNOSIS — D134 Benign neoplasm of liver: Secondary | ICD-10-CM

## 2019-08-08 MED ORDER — FUROSEMIDE 40 MG TABLET
ORAL_TABLET | ORAL | 1 refills | 60 days | Status: CP
Start: 2019-08-08 — End: 2019-10-07
  Filled 2019-08-08: qty 30, 60d supply, fill #0

## 2019-08-08 MED FILL — FUROSEMIDE 40 MG TABLET: 60 days supply | Qty: 30 | Fill #0 | Status: AC

## 2019-08-15 ENCOUNTER — Telehealth: Admit: 2019-08-15 | Discharge: 2019-08-16 | Payer: MEDICAID

## 2019-08-15 DIAGNOSIS — G4733 Obstructive sleep apnea (adult) (pediatric): Secondary | ICD-10-CM

## 2019-08-15 DIAGNOSIS — N920 Excessive and frequent menstruation with regular cycle: Secondary | ICD-10-CM

## 2019-08-15 DIAGNOSIS — F315 Bipolar disorder, current episode depressed, severe, with psychotic features: Secondary | ICD-10-CM

## 2019-08-15 DIAGNOSIS — E1142 Type 2 diabetes mellitus with diabetic polyneuropathy: Secondary | ICD-10-CM

## 2019-08-15 DIAGNOSIS — M542 Cervicalgia: Secondary | ICD-10-CM

## 2019-08-15 DIAGNOSIS — R0602 Shortness of breath: Secondary | ICD-10-CM

## 2019-08-15 DIAGNOSIS — K529 Noninfective gastroenteritis and colitis, unspecified: Secondary | ICD-10-CM

## 2019-08-15 DIAGNOSIS — K769 Liver disease, unspecified: Secondary | ICD-10-CM

## 2019-08-15 MED ORDER — NORETHINDRONE 0.5 MG-ETHINYL ESTRADIOL 35 MCG TABLET
ORAL_TABLET | Freq: Every day | ORAL | 3 refills | 84 days | Status: CP
Start: 2019-08-15 — End: 2020-08-14

## 2019-08-17 ENCOUNTER — Ambulatory Visit: Admit: 2019-08-17 | Discharge: 2019-08-18 | Payer: MEDICAID

## 2019-08-17 DIAGNOSIS — F315 Bipolar disorder, current episode depressed, severe, with psychotic features: Secondary | ICD-10-CM

## 2019-08-17 DIAGNOSIS — E1142 Type 2 diabetes mellitus with diabetic polyneuropathy: Secondary | ICD-10-CM

## 2019-08-17 DIAGNOSIS — Z87891 Personal history of nicotine dependence: Secondary | ICD-10-CM

## 2019-08-17 DIAGNOSIS — Z6841 Body Mass Index (BMI) 40.0 and over, adult: Secondary | ICD-10-CM

## 2019-08-17 DIAGNOSIS — E119 Type 2 diabetes mellitus without complications: Secondary | ICD-10-CM

## 2019-08-17 DIAGNOSIS — K219 Gastro-esophageal reflux disease without esophagitis: Secondary | ICD-10-CM

## 2019-08-17 DIAGNOSIS — F319 Bipolar disorder, unspecified: Secondary | ICD-10-CM

## 2019-08-17 DIAGNOSIS — R0602 Shortness of breath: Secondary | ICD-10-CM

## 2019-08-17 DIAGNOSIS — I10 Essential (primary) hypertension: Secondary | ICD-10-CM

## 2019-08-17 DIAGNOSIS — R0600 Dyspnea, unspecified: Secondary | ICD-10-CM

## 2019-08-17 DIAGNOSIS — Z7984 Long term (current) use of oral hypoglycemic drugs: Secondary | ICD-10-CM

## 2019-08-17 DIAGNOSIS — Z79899 Other long term (current) drug therapy: Secondary | ICD-10-CM

## 2019-08-17 DIAGNOSIS — Z7189 Other specified counseling: Secondary | ICD-10-CM

## 2019-08-23 ENCOUNTER — Institutional Professional Consult (permissible substitution): Admit: 2019-08-23 | Discharge: 2019-08-24 | Payer: MEDICAID | Attending: Registered" | Primary: Registered"

## 2019-08-23 DIAGNOSIS — F419 Anxiety disorder, unspecified: Secondary | ICD-10-CM

## 2019-08-23 DIAGNOSIS — F329 Major depressive disorder, single episode, unspecified: Secondary | ICD-10-CM

## 2019-08-23 DIAGNOSIS — Z6841 Body Mass Index (BMI) 40.0 and over, adult: Secondary | ICD-10-CM

## 2019-08-23 DIAGNOSIS — Z713 Dietary counseling and surveillance: Secondary | ICD-10-CM

## 2019-08-23 DIAGNOSIS — E119 Type 2 diabetes mellitus without complications: Secondary | ICD-10-CM

## 2019-08-23 DIAGNOSIS — Z01818 Encounter for other preprocedural examination: Secondary | ICD-10-CM

## 2019-08-23 DIAGNOSIS — Z79899 Other long term (current) drug therapy: Secondary | ICD-10-CM

## 2019-08-23 DIAGNOSIS — I10 Essential (primary) hypertension: Secondary | ICD-10-CM

## 2019-08-23 DIAGNOSIS — K219 Gastro-esophageal reflux disease without esophagitis: Secondary | ICD-10-CM

## 2019-08-23 DIAGNOSIS — E669 Obesity, unspecified: Secondary | ICD-10-CM

## 2019-08-26 DIAGNOSIS — I10 Essential (primary) hypertension: Secondary | ICD-10-CM

## 2019-08-26 DIAGNOSIS — E119 Type 2 diabetes mellitus without complications: Secondary | ICD-10-CM

## 2019-08-26 DIAGNOSIS — Z7984 Long term (current) use of oral hypoglycemic drugs: Secondary | ICD-10-CM

## 2019-08-26 DIAGNOSIS — F319 Bipolar disorder, unspecified: Secondary | ICD-10-CM

## 2019-08-26 DIAGNOSIS — R0600 Dyspnea, unspecified: Secondary | ICD-10-CM

## 2019-08-26 DIAGNOSIS — R0683 Snoring: Secondary | ICD-10-CM

## 2019-08-26 DIAGNOSIS — R0602 Shortness of breath: Secondary | ICD-10-CM

## 2019-08-26 DIAGNOSIS — Z79899 Other long term (current) drug therapy: Secondary | ICD-10-CM

## 2019-08-27 ENCOUNTER — Ambulatory Visit: Admit: 2019-08-27 | Discharge: 2019-08-27 | Payer: MEDICAID

## 2019-08-29 ENCOUNTER — Telehealth: Admit: 2019-08-29 | Discharge: 2019-08-30 | Payer: MEDICAID

## 2019-08-30 DIAGNOSIS — G479 Sleep disorder, unspecified: Principal | ICD-10-CM

## 2019-08-31 MED ORDER — FUROSEMIDE 40 MG TABLET: 40 mg | tablet | 11 refills | 60 days | Status: AC

## 2019-08-31 MED ORDER — PREGABALIN 75 MG CAPSULE: 75 mg | capsule | Freq: Two times a day (BID) | 0 refills | 90 days | Status: AC

## 2019-09-09 ENCOUNTER — Telehealth
Admit: 2019-09-09 | Discharge: 2019-09-10 | Payer: MEDICAID | Attending: Physician Assistant | Primary: Physician Assistant

## 2019-09-16 DIAGNOSIS — R768 Other specified abnormal immunological findings in serum: Principal | ICD-10-CM

## 2019-09-20 ENCOUNTER — Ambulatory Visit: Admit: 2019-09-20 | Discharge: 2019-09-21 | Payer: MEDICAID

## 2019-09-20 DIAGNOSIS — R768 Other specified abnormal immunological findings in serum: Principal | ICD-10-CM

## 2019-09-27 ENCOUNTER — Institutional Professional Consult (permissible substitution): Admit: 2019-09-27 | Discharge: 2019-09-28 | Payer: MEDICAID | Attending: Family | Primary: Family

## 2019-09-27 DIAGNOSIS — E1142 Type 2 diabetes mellitus with diabetic polyneuropathy: Principal | ICD-10-CM

## 2019-09-27 DIAGNOSIS — G4733 Obstructive sleep apnea (adult) (pediatric): Principal | ICD-10-CM

## 2019-09-27 DIAGNOSIS — F315 Bipolar disorder, current episode depressed, severe, with psychotic features: Principal | ICD-10-CM

## 2019-09-27 DIAGNOSIS — E282 Polycystic ovarian syndrome: Principal | ICD-10-CM

## 2019-09-27 DIAGNOSIS — Z7189 Other specified counseling: Principal | ICD-10-CM

## 2019-09-27 DIAGNOSIS — Z6841 Body Mass Index (BMI) 40.0 and over, adult: Principal | ICD-10-CM

## 2019-10-10 ENCOUNTER — Ambulatory Visit: Admit: 2019-10-10 | Discharge: 2019-10-10 | Payer: MEDICAID

## 2019-10-10 ENCOUNTER — Ambulatory Visit: Admit: 2019-10-10 | Discharge: 2019-10-11 | Payer: MEDICAID

## 2019-10-21 ENCOUNTER — Ambulatory Visit: Admit: 2019-10-21 | Discharge: 2019-10-22 | Payer: MEDICAID

## 2019-10-21 ENCOUNTER — Encounter: Admit: 2019-10-21 | Discharge: 2019-10-21 | Payer: MEDICAID

## 2019-10-24 ENCOUNTER — Ambulatory Visit: Admit: 2019-10-24 | Discharge: 2019-10-24 | Payer: MEDICAID

## 2019-10-24 ENCOUNTER — Encounter: Admit: 2019-10-24 | Discharge: 2019-10-24 | Payer: MEDICAID | Attending: Anesthesiology | Primary: Anesthesiology

## 2019-10-24 MED ORDER — JANUVIA 100 MG TABLET
ORAL_TABLET | 23 refills | 0 days | Status: CP
Start: 2019-10-24 — End: ?

## 2019-10-25 ENCOUNTER — Ambulatory Visit: Admit: 2019-10-25 | Discharge: 2019-10-25 | Disposition: A | Discharge status: Home / Self Care | Payer: MEDICAID

## 2019-10-25 ENCOUNTER — Emergency Department: Admit: 2019-10-25 | Discharge: 2019-10-25 | Disposition: A | Discharge status: Home / Self Care | Payer: MEDICAID

## 2019-10-25 MED ORDER — ALBUTEROL SULFATE HFA 90 MCG/ACTUATION AEROSOL INHALER
RESPIRATORY_TRACT | 0 refills | 0 days | Status: CP | PRN
Start: 2019-10-25 — End: 2020-10-24

## 2019-11-01 ENCOUNTER — Institutional Professional Consult (permissible substitution): Admit: 2019-11-01 | Discharge: 2019-11-02 | Payer: MEDICAID | Attending: Registered" | Primary: Registered"

## 2019-11-02 MED ORDER — PREGABALIN 75 MG CAPSULE
ORAL_CAPSULE | 0 refills | 0 days | Status: CP
Start: 2019-11-02 — End: ?

## 2019-11-03 ENCOUNTER — Telehealth: Admit: 2019-11-03 | Discharge: 2019-11-04 | Payer: MEDICAID | Attending: Registered" | Primary: Registered"

## 2019-11-23 ENCOUNTER — Institutional Professional Consult (permissible substitution): Admit: 2019-11-23 | Discharge: 2019-11-24 | Payer: MEDICAID | Attending: Clinical | Primary: Clinical

## 2019-12-05 MED ORDER — PANTOPRAZOLE 40 MG TABLET,DELAYED RELEASE
ORAL_TABLET | 1 refills | 0 days | Status: CP
Start: 2019-12-05 — End: ?

## 2019-12-06 ENCOUNTER — Ambulatory Visit: Admit: 2019-12-06 | Discharge: 2019-12-07 | Payer: MEDICAID | Attending: Surgery | Primary: Surgery

## 2019-12-09 MED ORDER — LORATADINE 10 MG TABLET
ORAL_TABLET | Freq: Every day | ORAL | 11 refills | 30 days | Status: CP
Start: 2019-12-09 — End: 2020-12-08

## 2019-12-15 MED ORDER — METFORMIN 500 MG TABLET
ORAL_TABLET | 3 refills | 0 days | Status: CP
Start: 2019-12-15 — End: ?

## 2020-01-02 ENCOUNTER — Ambulatory Visit: Admit: 2020-01-02 | Discharge: 2020-01-02 | Payer: MEDICAID

## 2020-01-02 DIAGNOSIS — L705 Acne excoriee des jeunes filles: Principal | ICD-10-CM

## 2020-01-02 DIAGNOSIS — Z6841 Body Mass Index (BMI) 40.0 and over, adult: Secondary | ICD-10-CM

## 2020-01-02 MED ORDER — AZELAIC ACID 20 % TOPICAL CREAM
Freq: Two times a day (BID) | TOPICAL | 5 refills | 0 days | Status: CP
Start: 2020-01-02 — End: ?

## 2020-01-20 ENCOUNTER — Telehealth: Admit: 2020-01-20 | Discharge: 2020-01-21 | Payer: MEDICAID

## 2020-01-20 MED ORDER — NORETHINDRONE 0.5 MG-ETHINYL ESTRADIOL 35 MCG TABLET
ORAL_TABLET | Freq: Every day | ORAL | 0 refills | 84 days | Status: CP
Start: 2020-01-20 — End: 2021-01-19

## 2020-01-23 MED ORDER — HYDROCHLOROTHIAZIDE 25 MG TABLET
ORAL_TABLET | 1 refills | 0 days | Status: CP
Start: 2020-01-23 — End: ?

## 2020-02-07 MED ORDER — PREGABALIN 75 MG CAPSULE
ORAL_CAPSULE | 0 refills | 0 days | Status: CP
Start: 2020-02-07 — End: ?

## 2020-02-07 MED ORDER — VALSARTAN 160 MG TABLET
ORAL_TABLET | Freq: Every day | ORAL | 1 refills | 90 days | Status: CP
Start: 2020-02-07 — End: ?

## 2020-02-09 ENCOUNTER — Ambulatory Visit
Admit: 2020-02-09 | Discharge: 2020-02-11 | Disposition: A | Discharge status: Home / Self Care | Payer: MEDICAID | Admitting: Family Medicine

## 2020-03-01 MED ORDER — LORATADINE 10 MG TABLET
ORAL_TABLET | Freq: Every day | ORAL | 1 refills | 90 days | Status: CP
Start: 2020-03-01 — End: ?

## 2020-03-01 MED ORDER — ATENOLOL 100 MG TABLET
ORAL_TABLET | Freq: Every day | ORAL | 3 refills | 90 days | Status: CP
Start: 2020-03-01 — End: ?

## 2020-03-08 MED ORDER — PREGABALIN 75 MG CAPSULE
ORAL_CAPSULE | 0 refills | 0 days | Status: CP
Start: 2020-03-08 — End: ?

## 2020-03-08 MED ORDER — GLIPIZIDE 10 MG TABLET
ORAL_TABLET | Freq: Two times a day (BID) | ORAL | 0 refills | 90 days | Status: CP
Start: 2020-03-08 — End: ?

## 2020-03-09 ENCOUNTER — Ambulatory Visit: Admit: 2020-03-09 | Discharge: 2020-03-10 | Disposition: A | Discharge status: Home / Self Care | Payer: MEDICAID

## 2020-03-09 DIAGNOSIS — N3090 Cystitis, unspecified without hematuria: Principal | ICD-10-CM

## 2020-03-09 DIAGNOSIS — R739 Hyperglycemia, unspecified: Principal | ICD-10-CM

## 2020-03-09 MED ORDER — CEFUROXIME AXETIL 500 MG TABLET
ORAL_TABLET | Freq: Two times a day (BID) | ORAL | 0 refills | 7 days | Status: CP
Start: 2020-03-09 — End: 2020-03-16

## 2020-03-14 DIAGNOSIS — Z0000 Encounter for general adult medical examination without abnormal findings: Principal | ICD-10-CM

## 2020-03-19 ENCOUNTER — Ambulatory Visit: Admit: 2020-03-19 | Discharge: 2020-03-19 | Disposition: A | Discharge status: Home / Self Care | Payer: MEDICAID

## 2020-03-19 DIAGNOSIS — N898 Other specified noninflammatory disorders of vagina: Principal | ICD-10-CM

## 2020-03-26 MED ORDER — NORETHINDRONE 0.5 MG-ETHINYL ESTRADIOL 35 MCG TABLET
ORAL_TABLET | Freq: Every day | ORAL | 0 refills | 84.00000 days | Status: CP
Start: 2020-03-26 — End: 2021-03-26

## 2020-03-27 DIAGNOSIS — E119 Type 2 diabetes mellitus without complications: Secondary | ICD-10-CM | POA: Insufficient documentation

## 2020-03-27 DIAGNOSIS — F319 Bipolar disorder, unspecified: Secondary | ICD-10-CM | POA: Insufficient documentation

## 2020-03-27 DIAGNOSIS — I10 Essential (primary) hypertension: Secondary | ICD-10-CM | POA: Insufficient documentation

## 2020-03-30 ENCOUNTER — Ambulatory Visit
Admit: 2020-03-30 | Discharge: 2020-04-01 | Disposition: A | Discharge status: Home / Self Care | Payer: MEDICAID | Admitting: Internal Medicine

## 2020-03-31 DIAGNOSIS — Z20822 Encounter for preprocedure screening laboratory testing for COVID-19: Principal | ICD-10-CM

## 2020-03-31 DIAGNOSIS — Z01812 Encounter for preprocedural laboratory examination: Principal | ICD-10-CM

## 2020-04-07 ENCOUNTER — Ambulatory Visit: Admit: 2020-04-07 | Discharge: 2020-04-11 | Disposition: A | Discharge status: Home / Self Care | Payer: MEDICAID

## 2020-04-11 MED ORDER — LACTOBACILLUS ACIDOPHILUS, BULGARICUS 100 MILLION CELL GRANULES PACKET
PACK | Freq: Three times a day (TID) | ORAL | 0 refills | 30 days | Status: CP
Start: 2020-04-11 — End: 2020-05-11

## 2020-04-11 MED ORDER — BUTALBITAL-ACETAMINOPHEN-CAFFEINE 50 MG-325 MG-40 MG TABLET
ORAL_TABLET | ORAL | 0 refills | 5 days | Status: CP | PRN
Start: 2020-04-11 — End: 2020-05-11

## 2020-04-11 MED ORDER — AMOXICILLIN 875 MG-POTASSIUM CLAVULANATE 125 MG TABLET
ORAL_TABLET | Freq: Two times a day (BID) | ORAL | 0 refills | 7.00000 days | Status: CP
Start: 2020-04-11 — End: 2020-04-18

## 2020-04-21 ENCOUNTER — Ambulatory Visit: Admit: 2020-04-21 | Discharge: 2020-04-22 | Payer: MEDICAID

## 2020-04-21 MED ORDER — FOSFOMYCIN TROMETHAMINE 3 GRAM ORAL PACKET
Freq: Once | ORAL | 0 refills | 1 days | Status: CP
Start: 2020-04-21 — End: 2020-04-21

## 2020-04-24 ENCOUNTER — Encounter: Admit: 2020-04-24 | Discharge: 2020-04-24 | Payer: MEDICAID

## 2020-04-24 ENCOUNTER — Ambulatory Visit: Admit: 2020-04-24 | Discharge: 2020-04-25 | Payer: MEDICAID

## 2020-04-24 MED ORDER — NORETHINDRONE 0.5 MG-ETHINYL ESTRADIOL 35 MCG TABLET
ORAL_TABLET | Freq: Every day | ORAL | 0 refills | 84 days | Status: CP
Start: 2020-04-24 — End: 2021-04-24

## 2020-05-07 ENCOUNTER — Ambulatory Visit: Admit: 2020-05-07 | Discharge: 2020-05-08 | Payer: MEDICAID

## 2020-05-07 ENCOUNTER — Encounter: Admit: 2020-05-07 | Discharge: 2020-05-07 | Payer: MEDICAID

## 2020-05-07 DIAGNOSIS — Z3041 Encounter for surveillance of contraceptive pills: Principal | ICD-10-CM

## 2020-05-07 DIAGNOSIS — N898 Other specified noninflammatory disorders of vagina: Principal | ICD-10-CM

## 2020-05-07 DIAGNOSIS — Z01419 Encounter for gynecological examination (general) (routine) without abnormal findings: Principal | ICD-10-CM

## 2020-05-10 MED ORDER — TERCONAZOLE 0.4 % VAGINAL CREAM
Freq: Every day | VAGINAL | 0 refills | 0.00000 days | Status: CP
Start: 2020-05-10 — End: ?

## 2020-05-24 MED ORDER — PREGABALIN 75 MG CAPSULE
ORAL_CAPSULE | Freq: Every morning | ORAL | 1 refills | 30.00000 days | Status: CP
Start: 2020-05-24 — End: 2020-06-23

## 2020-05-24 MED ORDER — PREGABALIN 75 MG CAPSULE: 150 mg | capsule | Freq: Every evening | 1 refills | 30 days | Status: AC

## 2020-06-18 MED ORDER — GLIPIZIDE 10 MG TABLET
ORAL_TABLET | Freq: Two times a day (BID) | ORAL | 1 refills | 90 days | Status: CP
Start: 2020-06-18 — End: 2020-09-16

## 2020-06-18 MED ORDER — PREGABALIN 75 MG CAPSULE
ORAL_CAPSULE | 1 refills | 0 days | Status: CP
Start: 2020-06-18 — End: ?

## 2020-06-27 ENCOUNTER — Ambulatory Visit: Admit: 2020-06-27 | Discharge: 2020-06-28 | Payer: MEDICAID

## 2020-06-27 DIAGNOSIS — J984 Other disorders of lung: Principal | ICD-10-CM

## 2020-06-27 MED ORDER — ALBUTEROL SULFATE HFA 90 MCG/ACTUATION AEROSOL INHALER
RESPIRATORY_TRACT | 2 refills | 0.00000 days | Status: CP
Start: 2020-06-27 — End: 2021-06-27

## 2020-06-27 MED ORDER — BUDESONIDE-FORMOTEROL HFA 160 MCG-4.5 MCG/ACTUATION AEROSOL INHALER
Freq: Two times a day (BID) | RESPIRATORY_TRACT | 0 refills | 15.00000 days | Status: CP
Start: 2020-06-27 — End: 2020-09-25

## 2020-07-12 ENCOUNTER — Ambulatory Visit: Admit: 2020-07-12 | Discharge: 2020-08-10 | Payer: MEDICAID

## 2020-07-12 ENCOUNTER — Institutional Professional Consult (permissible substitution): Admit: 2020-07-12 | Discharge: 2020-08-10 | Payer: MEDICAID

## 2020-07-12 DIAGNOSIS — R0602 Shortness of breath: Principal | ICD-10-CM

## 2020-07-12 DIAGNOSIS — J984 Other disorders of lung: Principal | ICD-10-CM

## 2020-07-13 ENCOUNTER — Telehealth: Admit: 2020-07-13 | Discharge: 2020-07-14 | Payer: MEDICAID

## 2020-07-13 DIAGNOSIS — Z6841 Body Mass Index (BMI) 40.0 and over, adult: Principal | ICD-10-CM

## 2020-07-13 DIAGNOSIS — E1140 Type 2 diabetes mellitus with diabetic neuropathy, unspecified: Principal | ICD-10-CM

## 2020-07-13 DIAGNOSIS — R609 Edema, unspecified: Principal | ICD-10-CM

## 2020-07-13 DIAGNOSIS — J984 Other disorders of lung: Principal | ICD-10-CM

## 2020-07-16 ENCOUNTER — Encounter
Admit: 2020-07-16 | Discharge: 2020-07-16 | Payer: MEDICAID | Attending: Certified Registered" | Primary: Certified Registered"

## 2020-07-16 ENCOUNTER — Ambulatory Visit: Admit: 2020-07-16 | Discharge: 2020-07-16 | Payer: MEDICAID

## 2020-07-16 MED ORDER — IBUPROFEN 600 MG TABLET
ORAL_TABLET | Freq: Four times a day (QID) | ORAL | 0 refills | 14.00000 days | Status: CP
Start: 2020-07-16 — End: 2020-07-30

## 2020-07-16 MED ORDER — OXYCODONE 5 MG TABLET
ORAL_TABLET | ORAL | 0 refills | 2.00000 days | Status: CP | PRN
Start: 2020-07-16 — End: ?
  Filled 2020-07-16: qty 10, 1d supply, fill #0

## 2020-07-16 MED ORDER — ACETAMINOPHEN 325 MG TABLET
ORAL_TABLET | Freq: Four times a day (QID) | ORAL | 0 refills | 14.00000 days | Status: CP
Start: 2020-07-16 — End: 2020-07-30
  Filled 2020-07-16: qty 112, 14d supply, fill #0
  Filled 2020-07-16: qty 56, 14d supply, fill #0

## 2020-07-16 MED ORDER — CHLORHEXIDINE GLUCONATE 0.12 % MOUTHWASH
Freq: Two times a day (BID) | OROMUCOSAL | 0 refills | 16.00000 days | Status: CP
Start: 2020-07-16 — End: 2020-08-01
  Filled 2020-07-16: qty 473, 16d supply, fill #0

## 2020-07-16 MED ORDER — AMOXICILLIN 875 MG-POTASSIUM CLAVULANATE 125 MG TABLET
ORAL_TABLET | Freq: Two times a day (BID) | ORAL | 0 refills | 10.00000 days | Status: CP
Start: 2020-07-16 — End: 2020-07-26
  Filled 2020-07-16: qty 20, 10d supply, fill #0

## 2020-07-16 MED FILL — OXYCODONE 5 MG TABLET: 1 days supply | Qty: 10 | Fill #0 | Status: AC

## 2020-07-16 MED FILL — CHLORHEXIDINE GLUCONATE 0.12 % MOUTHWASH: 16 days supply | Qty: 473 | Fill #0 | Status: AC

## 2020-07-16 MED FILL — IBUPROFEN 600 MG TABLET: 14 days supply | Qty: 56 | Fill #0 | Status: AC

## 2020-07-16 MED FILL — AMOXICILLIN 875 MG-POTASSIUM CLAVULANATE 125 MG TABLET: 10 days supply | Qty: 20 | Fill #0 | Status: AC

## 2020-07-16 MED FILL — ACETAMINOPHEN 325 MG TABLET: 14 days supply | Qty: 112 | Fill #0 | Status: AC

## 2020-07-17 DIAGNOSIS — I10 Essential (primary) hypertension: Principal | ICD-10-CM

## 2020-07-17 MED ORDER — HYDROCHLOROTHIAZIDE 25 MG TABLET
ORAL_TABLET | Freq: Every day | ORAL | 0 refills | 90 days | Status: CP
Start: 2020-07-17 — End: ?

## 2020-07-17 MED ORDER — VALSARTAN 160 MG TABLET
ORAL_TABLET | Freq: Every day | ORAL | 0 refills | 90 days | Status: CP
Start: 2020-07-17 — End: ?

## 2020-07-27 DIAGNOSIS — R0602 Shortness of breath: Principal | ICD-10-CM

## 2020-07-30 DIAGNOSIS — R0602 Shortness of breath: Principal | ICD-10-CM

## 2020-08-02 ENCOUNTER — Encounter: Admit: 2020-08-02 | Discharge: 2020-08-02 | Payer: MEDICAID

## 2020-08-02 ENCOUNTER — Ambulatory Visit: Admit: 2020-08-02 | Discharge: 2020-08-03 | Payer: MEDICAID

## 2020-08-02 DIAGNOSIS — Z8639 Personal history of other endocrine, nutritional and metabolic disease: Principal | ICD-10-CM

## 2020-08-02 DIAGNOSIS — Z5181 Encounter for therapeutic drug level monitoring: Principal | ICD-10-CM

## 2020-08-02 DIAGNOSIS — E119 Type 2 diabetes mellitus without complications: Principal | ICD-10-CM

## 2020-08-02 DIAGNOSIS — R197 Diarrhea, unspecified: Principal | ICD-10-CM

## 2020-08-02 DIAGNOSIS — Z23 Encounter for immunization: Principal | ICD-10-CM

## 2020-08-02 MED ORDER — LANCETS
11 refills | 0 days | Status: CP
Start: 2020-08-02 — End: ?

## 2020-08-02 MED ORDER — BLOOD GLUCOSE TEST STRIPS
ORAL_STRIP | 11 refills | 0 days | Status: CP
Start: 2020-08-02 — End: ?

## 2020-08-02 MED ORDER — BLOOD-GLUCOSE METER KIT WRAPPER
0 refills | 0 days | Status: CP
Start: 2020-08-02 — End: ?

## 2020-08-03 ENCOUNTER — Institutional Professional Consult (permissible substitution): Admit: 2020-08-03 | Discharge: 2020-08-04 | Payer: MEDICAID

## 2020-08-03 ENCOUNTER — Encounter: Admit: 2020-08-03 | Discharge: 2020-08-03 | Payer: MEDICAID

## 2020-08-14 DIAGNOSIS — E119 Type 2 diabetes mellitus without complications: Principal | ICD-10-CM

## 2020-08-14 MED ORDER — PEN NEEDLE, DIABETIC 32 GAUGE X 1/4" (6 MM)
Freq: Every day | 11 refills | 0 days | Status: CP
Start: 2020-08-14 — End: ?

## 2020-08-14 MED ORDER — VICTOZA 2-PAK 0.6 MG/0.1 ML (18 MG/3 ML) SUBCUTANEOUS PEN INJECTOR
Freq: Every day | SUBCUTANEOUS | 0 refills | 10 days | Status: CP
Start: 2020-08-14 — End: 2020-08-21

## 2020-08-15 MED ORDER — ALLERGY RELIEF (LORATADINE) 10 MG TABLET
ORAL_TABLET | 1 refills | 0 days | Status: CP
Start: 2020-08-15 — End: ?

## 2020-08-15 MED ORDER — PANTOPRAZOLE 40 MG TABLET,DELAYED RELEASE
ORAL_TABLET | 1 refills | 0 days | Status: CP
Start: 2020-08-15 — End: ?

## 2020-08-16 DIAGNOSIS — N920 Excessive and frequent menstruation with regular cycle: Principal | ICD-10-CM

## 2020-08-16 MED ORDER — NORTREL 0.5/35 (28) 0.5 MG-35 MCG TABLET
ORAL_TABLET | 0 refills | 0 days | Status: CP
Start: 2020-08-16 — End: ?

## 2020-08-20 ENCOUNTER — Ambulatory Visit: Admit: 2020-08-15 | Payer: MEDICAID

## 2020-08-20 ENCOUNTER — Ambulatory Visit: Admit: 2020-08-17 | Payer: MEDICAID

## 2020-08-21 MED ORDER — VICTOZA 3-PAK 0.6 MG/0.1 ML (18 MG/3 ML) SUBCUTANEOUS PEN INJECTOR
Freq: Every day | SUBCUTANEOUS | 3 refills | 90.00000 days | Status: CP
Start: 2020-08-21 — End: 2021-08-21

## 2020-08-24 ENCOUNTER — Ambulatory Visit: Admit: 2020-08-24 | Payer: MEDICAID

## 2020-08-24 ENCOUNTER — Ambulatory Visit: Admit: 2020-08-27 | Payer: MEDICAID

## 2020-08-27 ENCOUNTER — Ambulatory Visit: Admit: 2020-08-27 | Payer: MEDICAID

## 2020-08-27 ENCOUNTER — Ambulatory Visit: Admit: 2020-08-24 | Payer: MEDICAID

## 2020-09-17 MED ORDER — FUROSEMIDE 40 MG TABLET
ORAL_TABLET | ORAL | 5 refills | 0.00000 days | Status: CP
Start: 2020-09-17 — End: 2020-09-17

## 2020-09-17 MED ORDER — FUROSEMIDE 40 MG TABLET: 40 mg | tablet | 5 refills | 60 days | Status: AC

## 2020-09-17 MED ORDER — PREGABALIN 75 MG CAPSULE
ORAL_CAPSULE | 1 refills | 0 days | Status: CP
Start: 2020-09-17 — End: 2020-11-12

## 2020-09-21 ENCOUNTER — Ambulatory Visit: Admit: 2020-09-21 | Discharge: 2020-09-22 | Payer: MEDICAID

## 2020-09-21 DIAGNOSIS — F419 Anxiety disorder, unspecified: Principal | ICD-10-CM

## 2020-09-21 DIAGNOSIS — F259 Schizoaffective disorder, unspecified: Principal | ICD-10-CM

## 2020-09-21 DIAGNOSIS — I10 Essential (primary) hypertension: Principal | ICD-10-CM

## 2020-09-21 DIAGNOSIS — B373 Candidiasis of vulva and vagina: Principal | ICD-10-CM

## 2020-09-21 DIAGNOSIS — F315 Bipolar disorder, current episode depressed, severe, with psychotic features: Principal | ICD-10-CM

## 2020-09-21 DIAGNOSIS — E119 Type 2 diabetes mellitus without complications: Principal | ICD-10-CM

## 2020-09-21 MED ORDER — ACCU-CHEK GUIDE TEST STRIPS
11 refills | 0 days | Status: CP
Start: 2020-09-21 — End: ?

## 2020-09-21 MED ORDER — LANCETS
11 refills | 0 days | Status: CP
Start: 2020-09-21 — End: ?

## 2020-09-21 MED ORDER — FLUCONAZOLE 150 MG TABLET
ORAL_TABLET | Freq: Every day | ORAL | 1 refills | 2 days | Status: CP
Start: 2020-09-21 — End: 2020-11-06

## 2020-09-21 MED ORDER — BLOOD-GLUCOSE METER KIT WRAPPER
0 refills | 0 days | Status: CP
Start: 2020-09-21 — End: ?

## 2020-10-08 DIAGNOSIS — I10 Essential (primary) hypertension: Principal | ICD-10-CM

## 2020-10-08 MED ORDER — VALSARTAN 160 MG TABLET
ORAL_TABLET | 0 refills | 0 days | Status: CP
Start: 2020-10-08 — End: 2021-01-03

## 2020-10-08 MED ORDER — HYDROCHLOROTHIAZIDE 25 MG TABLET
ORAL_TABLET | 0 refills | 0 days | Status: CP
Start: 2020-10-08 — End: 2021-01-03

## 2020-10-09 DIAGNOSIS — N920 Excessive and frequent menstruation with regular cycle: Principal | ICD-10-CM

## 2020-10-09 MED ORDER — NORTREL 0.5/35 (28) 0.5 MG-35 MCG TABLET
ORAL_TABLET | 0 refills | 0 days | Status: CP
Start: 2020-10-09 — End: ?

## 2020-11-06 ENCOUNTER — Ambulatory Visit: Admit: 2020-11-06 | Discharge: 2020-11-07 | Payer: MEDICAID

## 2020-11-06 DIAGNOSIS — E282 Polycystic ovarian syndrome: Principal | ICD-10-CM

## 2020-11-06 DIAGNOSIS — E119 Type 2 diabetes mellitus without complications: Principal | ICD-10-CM

## 2020-11-06 DIAGNOSIS — Z6841 Body Mass Index (BMI) 40.0 and over, adult: Principal | ICD-10-CM

## 2020-11-06 MED ORDER — ALBUTEROL SULFATE HFA 90 MCG/ACTUATION AEROSOL INHALER
RESPIRATORY_TRACT | 2 refills | 0 days | Status: CP
Start: 2020-11-06 — End: 2021-11-06

## 2020-11-12 MED ORDER — PREGABALIN 75 MG CAPSULE
ORAL_CAPSULE | 1 refills | 0 days | Status: CP
Start: 2020-11-12 — End: 2021-01-03

## 2020-12-09 DIAGNOSIS — F315 Bipolar disorder, current episode depressed, severe, with psychotic features: Principal | ICD-10-CM

## 2020-12-09 MED ORDER — LITHIUM CARBONATE 300 MG TABLET
ORAL_TABLET | Freq: Every evening | ORAL | 0 refills | 30.00000 days | Status: CP
Start: 2020-12-09 — End: 2021-01-03

## 2020-12-31 ENCOUNTER — Encounter: Admit: 2020-12-31 | Discharge: 2020-12-31 | Payer: MEDICAID

## 2020-12-31 ENCOUNTER — Institutional Professional Consult (permissible substitution): Admit: 2020-12-31 | Discharge: 2021-01-01 | Payer: MEDICAID

## 2020-12-31 DIAGNOSIS — N3001 Acute cystitis with hematuria: Principal | ICD-10-CM

## 2020-12-31 DIAGNOSIS — R300 Dysuria: Principal | ICD-10-CM

## 2020-12-31 MED ORDER — PHENAZOPYRIDINE 95 MG TABLET
ORAL_TABLET | Freq: Three times a day (TID) | ORAL | 0 refills | 2 days | Status: CP | PRN
Start: 2020-12-31 — End: 2021-01-02

## 2020-12-31 MED ORDER — NITROFURANTOIN MONOHYDRATE/MACROCRYSTALS 100 MG CAPSULE
ORAL_CAPSULE | Freq: Two times a day (BID) | ORAL | 0 refills | 5.00000 days | Status: CP
Start: 2020-12-31 — End: 2021-01-03

## 2021-01-03 ENCOUNTER — Ambulatory Visit: Admit: 2021-01-03 | Discharge: 2021-01-04 | Payer: MEDICAID | Attending: Family | Primary: Family

## 2021-01-03 DIAGNOSIS — R300 Dysuria: Principal | ICD-10-CM

## 2021-01-03 DIAGNOSIS — G4489 Other headache syndrome: Principal | ICD-10-CM

## 2021-01-03 DIAGNOSIS — F315 Bipolar disorder, current episode depressed, severe, with psychotic features: Principal | ICD-10-CM

## 2021-01-03 DIAGNOSIS — I10 Essential (primary) hypertension: Principal | ICD-10-CM

## 2021-01-03 DIAGNOSIS — R5383 Other fatigue: Principal | ICD-10-CM

## 2021-01-03 DIAGNOSIS — N3001 Acute cystitis with hematuria: Principal | ICD-10-CM

## 2021-01-03 DIAGNOSIS — E1165 Type 2 diabetes mellitus with hyperglycemia: Principal | ICD-10-CM

## 2021-01-03 MED ORDER — FLUCONAZOLE 100 MG TABLET
ORAL_TABLET | 0 refills | 0 days | Status: CP
Start: 2021-01-03 — End: ?

## 2021-01-03 MED ORDER — LITHIUM CARBONATE 300 MG TABLET
ORAL_TABLET | 0 refills | 0 days | Status: CP
Start: 2021-01-03 — End: ?

## 2021-01-03 MED ORDER — VALSARTAN 160 MG TABLET
ORAL_TABLET | 0 refills | 0 days | Status: CP
Start: 2021-01-03 — End: ?

## 2021-01-03 MED ORDER — CEPHALEXIN 500 MG CAPSULE
ORAL_CAPSULE | Freq: Two times a day (BID) | ORAL | 0 refills | 10 days | Status: CP
Start: 2021-01-03 — End: 2021-01-13

## 2021-01-03 MED ORDER — HYDROCHLOROTHIAZIDE 25 MG TABLET
ORAL_TABLET | 0 refills | 0 days | Status: CP
Start: 2021-01-03 — End: ?

## 2021-01-03 MED ORDER — PREGABALIN 75 MG CAPSULE
ORAL_CAPSULE | 0 refills | 0 days | Status: CP
Start: 2021-01-03 — End: ?

## 2021-01-04 MED ORDER — METFORMIN 500 MG TABLET
ORAL_TABLET | 0 refills | 0 days | Status: CP
Start: 2021-01-04 — End: ?

## 2021-01-07 DIAGNOSIS — I10 Essential (primary) hypertension: Principal | ICD-10-CM

## 2021-01-07 DIAGNOSIS — F315 Bipolar disorder, current episode depressed, severe, with psychotic features: Principal | ICD-10-CM

## 2021-01-07 DIAGNOSIS — Z5181 Encounter for therapeutic drug level monitoring: Principal | ICD-10-CM

## 2021-01-08 ENCOUNTER — Ambulatory Visit: Admit: 2021-01-08 | Discharge: 2021-01-09 | Payer: MEDICAID

## 2021-01-08 DIAGNOSIS — Z5181 Encounter for therapeutic drug level monitoring: Principal | ICD-10-CM

## 2021-01-08 DIAGNOSIS — F315 Bipolar disorder, current episode depressed, severe, with psychotic features: Principal | ICD-10-CM

## 2021-01-08 DIAGNOSIS — I10 Essential (primary) hypertension: Principal | ICD-10-CM

## 2021-01-09 ENCOUNTER — Ambulatory Visit: Admit: 2021-01-09 | Payer: MEDICAID | Attending: Obstetrics & Gynecology | Primary: Obstetrics & Gynecology

## 2021-01-24 ENCOUNTER — Ambulatory Visit: Admit: 2021-01-24 | Discharge: 2021-02-06 | Payer: MEDICAID

## 2021-01-24 ENCOUNTER — Ambulatory Visit: Admit: 2021-01-24 | Discharge: 2021-01-25 | Payer: MEDICAID

## 2021-01-24 DIAGNOSIS — R350 Frequency of micturition: Principal | ICD-10-CM

## 2021-01-24 DIAGNOSIS — R700 Elevated erythrocyte sedimentation rate: Principal | ICD-10-CM

## 2021-01-24 DIAGNOSIS — D72829 Elevated white blood cell count, unspecified: Principal | ICD-10-CM

## 2021-01-24 DIAGNOSIS — Z6841 Body Mass Index (BMI) 40.0 and over, adult: Principal | ICD-10-CM

## 2021-01-24 DIAGNOSIS — E119 Type 2 diabetes mellitus without complications: Principal | ICD-10-CM

## 2021-01-24 DIAGNOSIS — I10 Essential (primary) hypertension: Principal | ICD-10-CM

## 2021-01-24 MED ORDER — PIOGLITAZONE 15 MG TABLET
ORAL_TABLET | Freq: Every day | ORAL | 1 refills | 30 days | Status: CP
Start: 2021-01-24 — End: 2022-01-24

## 2021-01-31 DIAGNOSIS — I10 Essential (primary) hypertension: Principal | ICD-10-CM

## 2021-01-31 DIAGNOSIS — N920 Excessive and frequent menstruation with regular cycle: Principal | ICD-10-CM

## 2021-02-01 MED ORDER — VALSARTAN 160 MG TABLET
ORAL_TABLET | 5 refills | 0 days | Status: CP
Start: 2021-02-01 — End: ?

## 2021-02-01 MED ORDER — PREGABALIN 75 MG CAPSULE
ORAL_CAPSULE | 5 refills | 0 days | Status: CP
Start: 2021-02-01 — End: ?

## 2021-02-01 MED ORDER — HYDROCHLOROTHIAZIDE 25 MG TABLET
ORAL_TABLET | 5 refills | 0 days | Status: CP
Start: 2021-02-01 — End: ?

## 2021-02-04 ENCOUNTER — Encounter: Admit: 2021-02-04 | Discharge: 2021-02-04 | Payer: MEDICAID

## 2021-02-04 ENCOUNTER — Ambulatory Visit: Admit: 2021-02-04 | Discharge: 2021-02-05 | Payer: MEDICAID

## 2021-02-04 DIAGNOSIS — R197 Diarrhea, unspecified: Principal | ICD-10-CM

## 2021-02-04 DIAGNOSIS — E010 Iodine-deficiency related diffuse (endemic) goiter: Principal | ICD-10-CM

## 2021-02-04 DIAGNOSIS — R7989 Other specified abnormal findings of blood chemistry: Principal | ICD-10-CM

## 2021-02-04 DIAGNOSIS — R350 Frequency of micturition: Principal | ICD-10-CM

## 2021-02-04 DIAGNOSIS — E119 Type 2 diabetes mellitus without complications: Principal | ICD-10-CM

## 2021-02-04 DIAGNOSIS — D72829 Elevated white blood cell count, unspecified: Principal | ICD-10-CM

## 2021-02-04 MED ORDER — PANTOPRAZOLE 40 MG TABLET,DELAYED RELEASE
ORAL_TABLET | 1 refills | 0 days | Status: CP
Start: 2021-02-04 — End: ?

## 2021-02-04 MED ORDER — EMPAGLIFLOZIN 10 MG TABLET
ORAL_TABLET | Freq: Every day | ORAL | 3 refills | 90.00000 days | Status: CP
Start: 2021-02-04 — End: ?

## 2021-02-04 MED ORDER — PREGABALIN 75 MG CAPSULE
ORAL_CAPSULE | 0 refills | 0.00000 days | Status: CP
Start: 2021-02-04 — End: 2021-02-04

## 2021-02-04 MED ORDER — ALLERGY RELIEF (LORATADINE) 10 MG TABLET
ORAL_TABLET | 1 refills | 0 days | Status: CP
Start: 2021-02-04 — End: ?

## 2021-02-04 MED ORDER — VICTOZA 2-PAK 0.6 MG/0.1 ML (18 MG/3 ML) SUBCUTANEOUS PEN INJECTOR
Freq: Every day | SUBCUTANEOUS | 3 refills | 30 days | Status: CP
Start: 2021-02-04 — End: 2022-02-04

## 2021-02-04 MED ORDER — NORTREL 0.5/35 (28) 0.5 MG-35 MCG TABLET
ORAL_TABLET | 0 refills | 0 days | Status: CP
Start: 2021-02-04 — End: ?

## 2021-02-04 MED ORDER — ATENOLOL 100 MG TABLET
ORAL_TABLET | 2 refills | 0 days | Status: CP
Start: 2021-02-04 — End: ?

## 2021-02-05 ENCOUNTER — Ambulatory Visit: Admit: 2021-02-05 | Discharge: 2021-02-06 | Payer: MEDICAID

## 2021-02-05 DIAGNOSIS — N3001 Acute cystitis with hematuria: Principal | ICD-10-CM

## 2021-02-05 MED ORDER — CEPHALEXIN 500 MG CAPSULE
ORAL_CAPSULE | Freq: Four times a day (QID) | ORAL | 0 refills | 10.00000 days | Status: CP
Start: 2021-02-05 — End: 2021-02-15

## 2021-02-06 ENCOUNTER — Emergency Department
Admit: 2021-02-06 | Discharge: 2021-02-06 | Disposition: A | Discharge status: Home / Self Care | Payer: MEDICAID | Attending: Anatomic Pathology & Clinical Pathology | Primary: Anatomic Pathology & Clinical Pathology

## 2021-02-06 ENCOUNTER — Ambulatory Visit: Admit: 2021-02-06 | Discharge: 2021-02-07 | Disposition: A | Discharge status: Home / Self Care | Payer: MEDICAID

## 2021-02-06 ENCOUNTER — Institutional Professional Consult (permissible substitution): Admit: 2021-02-06 | Discharge: 2021-02-07 | Payer: MEDICAID

## 2021-02-06 ENCOUNTER — Emergency Department: Admit: 2021-02-06 | Discharge: 2021-02-07 | Disposition: A | Discharge status: Home / Self Care | Payer: MEDICAID

## 2021-02-06 MED ORDER — PEN NEEDLE, DIABETIC 32 GAUGE X 1/4" (6 MM)
Freq: Every day | 11 refills | 0 days | Status: CP
Start: 2021-02-06 — End: ?

## 2021-02-08 DIAGNOSIS — C8590 Non-Hodgkin lymphoma, unspecified, unspecified site: Principal | ICD-10-CM

## 2021-02-09 ENCOUNTER — Ambulatory Visit: Admit: 2021-02-09 | Discharge: 2021-02-09 | Disposition: A | Discharge status: Home / Self Care | Payer: MEDICAID

## 2021-02-09 DIAGNOSIS — R112 Nausea with vomiting, unspecified: Principal | ICD-10-CM

## 2021-02-09 DIAGNOSIS — D72829 Elevated white blood cell count, unspecified: Principal | ICD-10-CM

## 2021-02-09 DIAGNOSIS — R197 Diarrhea, unspecified: Principal | ICD-10-CM

## 2021-02-11 ENCOUNTER — Ambulatory Visit: Admit: 2021-02-11 | Discharge: 2021-02-12 | Payer: MEDICAID

## 2021-02-11 ENCOUNTER — Ambulatory Visit: Admit: 2021-02-11 | Discharge: 2021-02-24 | Payer: MEDICAID

## 2021-02-11 DIAGNOSIS — D72829 Elevated white blood cell count, unspecified: Principal | ICD-10-CM

## 2021-02-11 DIAGNOSIS — E1165 Type 2 diabetes mellitus with hyperglycemia: Principal | ICD-10-CM

## 2021-02-11 DIAGNOSIS — R350 Frequency of micturition: Principal | ICD-10-CM

## 2021-02-11 DIAGNOSIS — R7989 Other specified abnormal findings of blood chemistry: Principal | ICD-10-CM

## 2021-02-11 MED ORDER — LEVEMIR FLEXTOUCH U-100 INSULIN 100 UNIT/ML (3 ML) SUBCUTANEOUS PEN
Freq: Every day | SUBCUTANEOUS | 12 refills | 150 days | Status: CP
Start: 2021-02-11 — End: 2022-02-11

## 2021-02-18 ENCOUNTER — Ambulatory Visit: Admit: 2021-02-18 | Discharge: 2021-02-19 | Payer: MEDICAID

## 2021-02-18 DIAGNOSIS — E1165 Type 2 diabetes mellitus with hyperglycemia: Principal | ICD-10-CM

## 2021-02-18 DIAGNOSIS — E119 Type 2 diabetes mellitus without complications: Principal | ICD-10-CM

## 2021-02-18 DIAGNOSIS — Z794 Long term (current) use of insulin: Principal | ICD-10-CM

## 2021-02-18 MED ORDER — INSULIN LISPRO (U-100) 100 UNIT/ML SUBCUTANEOUS PEN
12 refills | 0 days | Status: CP
Start: 2021-02-18 — End: ?

## 2021-02-18 MED ORDER — LEVEMIR FLEXTOUCH U-100 INSULIN 100 UNIT/ML (3 ML) SUBCUTANEOUS PEN
Freq: Every day | SUBCUTANEOUS | 12 refills | 75 days | Status: CP
Start: 2021-02-18 — End: 2022-02-18

## 2021-02-18 MED ORDER — FREESTYLE LIBRE 2 SENSOR KIT
11 refills | 0.00000 days | Status: CP
Start: 2021-02-18 — End: ?

## 2021-02-18 MED ORDER — FREESTYLE LIBRE 2 READER
Freq: Once | 0 refills | 0 days | Status: CP
Start: 2021-02-18 — End: 2021-02-18

## 2021-02-19 ENCOUNTER — Ambulatory Visit: Admit: 2021-02-19 | Discharge: 2021-02-20 | Payer: MEDICAID

## 2021-02-20 DIAGNOSIS — C9210 Chronic myeloid leukemia, BCR/ABL-positive, not having achieved remission: Secondary | ICD-10-CM

## 2021-02-20 HISTORY — DX: Chronic myeloid leukemia, BCR/ABL-positive, not having achieved remission: C92.10

## 2021-02-22 DIAGNOSIS — C9210 Chronic myeloid leukemia, BCR/ABL-positive, not having achieved remission: Principal | ICD-10-CM

## 2021-02-26 ENCOUNTER — Ambulatory Visit: Admit: 2021-02-26 | Discharge: 2021-02-27 | Payer: MEDICAID

## 2021-02-28 ENCOUNTER — Other Ambulatory Visit: Admit: 2021-02-28 | Discharge: 2021-03-01 | Payer: MEDICAID

## 2021-02-28 ENCOUNTER — Ambulatory Visit: Admit: 2021-02-28 | Discharge: 2021-03-01 | Payer: MEDICAID

## 2021-02-28 DIAGNOSIS — C9210 Chronic myeloid leukemia, BCR/ABL-positive, not having achieved remission: Principal | ICD-10-CM

## 2021-03-05 ENCOUNTER — Ambulatory Visit: Admit: 2021-03-05 | Discharge: 2021-03-06 | Payer: MEDICAID

## 2021-03-07 DIAGNOSIS — C9210 Chronic myeloid leukemia, BCR/ABL-positive, not having achieved remission: Principal | ICD-10-CM

## 2021-03-07 DIAGNOSIS — K7689 Other specified diseases of liver: Principal | ICD-10-CM

## 2021-03-07 DIAGNOSIS — K760 Fatty (change of) liver, not elsewhere classified: Principal | ICD-10-CM

## 2021-03-08 DIAGNOSIS — C9210 Chronic myeloid leukemia, BCR/ABL-positive, not having achieved remission: Principal | ICD-10-CM

## 2021-03-12 ENCOUNTER — Ambulatory Visit
Admit: 2021-03-12 | Discharge: 2021-03-13 | Payer: MEDICAID | Attending: Hematology & Oncology | Primary: Hematology & Oncology

## 2021-03-12 ENCOUNTER — Other Ambulatory Visit: Admit: 2021-03-12 | Discharge: 2021-03-13 | Payer: MEDICAID

## 2021-03-12 DIAGNOSIS — C9210 Chronic myeloid leukemia, BCR/ABL-positive, not having achieved remission: Principal | ICD-10-CM

## 2021-03-12 MED ORDER — IMATINIB 400 MG TABLET
ORAL_TABLET | Freq: Every day | ORAL | 11 refills | 30 days | Status: CP
Start: 2021-03-12 — End: 2022-03-12
  Filled 2021-03-15: qty 30, 30d supply, fill #0
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
## 2021-03-13 NOTE — Unmapped (Signed)
[facility] Specialty Medication Onboarding    Specialty Medication: Imatinib 400mg  tablet  Prior Authorization: Not Required   Financial Assistance: No - copay  <$25  Final Copay/Day Supply: $3 / 30 days    Insurance Restrictions: None     Notes to Pharmacist:     The triage team has completed the benefits investigation and has determined that the patient is able to fill this medication at [facility]. Please contact the patient to complete the onboarding or follow up with the prescribing physician as needed.

## 2021-03-13 NOTE — Unmapped (Signed)
[facility]   Patient Onboarding/Medication Counseling    Jenna Mosley is a 30 y.o. female with Chronic myelocytic leukemia who I am counseling today on initiation of therapy.  I am speaking to the patient.    Was a Nurse, learning disability used for this call? No    Verified patient's date of birth / HIPAA.    Specialty medication(s) to be sent: Hematology/Oncology: Imatinib      Non-specialty medications/supplies to be sent: n/a      Medications not needed at this time: n/a         Gleevec (imatinib)    Medication & Administration     Dosage:   ??? Take 1 tablets (400mg ) by mouth (1) times daily  ???     Administration:   ??? Take with food  ??? Take with a large glass of water  ??? Tablets may be dispersed in water or apple juice  ??? Use ~50 mL for 100 mg tablet, ~200 mL for 400 mg tablet); stir until dissolved and administer immediately.  ??? Rinse the glass and administer residue    Adherence/Missed dose instructions:  ??? Skip the missed dose and go back to your normal time.  ??? Do not take 2 doses at the same time or extra doses.    Goals of Therapy   ??? To treat disease progression    Side Effects & Monitoring Parameters   ??? Common side effects:  ??? Feeling weak or tired-anemia  ??? Fluid retention/swelling  ??? Nausea/Vomiting/Diarrhea/Abdominal pain  ??? Muscle pain or spasms, bone pain  ??? Fatigue  ??? Rash  ??? Headache    ??? The following side effects should be reported to the provider:  ??? Allergic reaction  ??? Kidney issues (unable to pass urine, change in quantity of urine, or blood in urine)  ??? Liver issues (yellowing of skin or eyes, dark urine, light colored stool)  ??? Infection like symptoms (fever, cough, chills, wound that won't heal)- Neutropenia  ??? Abnormal bruising or bleeding (nosebleeds, blood in urine or stool, etc???)- Thrombocytopenia  ??? Change in eyesight  ??? Chest pain or pressure  ??? A burning, numbness, or tingling feeling that is not normal.    ??? Monitoring Parameters:  ??? CBC: weekly for first month, biweekly for second month, then as needed  ??? Liver function tests (baseline and monthly)  ??? Renal function tests (baseline and as needed)  ??? Serum electrolyte monitoring  ??? Pregnancy test prior to treatment  ??? Thyroid tests (baseline and monthly or as needed)    Contraindications, Warnings, & Precautions   ??? Caution is recommended while driving/operating motor vehicles or heavy machinery when due to (dizziness, blurred vision, or sleepiness)  ??? Women of reproductive potential should use highly effective contraception during treatment and for 2 weeks after the last dose.  ??? Breastfeeding is not recommended by the manufacturer during treatment and for 1 month after the last dose.      Drug/Food Interactions     ??? Medication list reviewed in Epic. The patient was instructed to inform the care team before taking any new medications or supplements. may increase the serum concentration of TraZODone - monitor therapy.   ??? Speak to provider before getting vaccines (live or inactive)  ??? Avoid grapefruit juice    Storage, Handling Precautions, & Disposal   ??? Store at room temperature  ??? Keep away from children and pets  ??? Caregivers should avoid contact and wear gloves when administering  Current Medications (including OTC/herbals), Comorbidities and Allergies     Current Outpatient Medications   Medication Sig Dispense Refill   ??? albuterol HFA 90 mcg/actuation inhaler Inhale 2 puffs Take as directed. Take puff before exercise and you can take an additional puff while you are exercising if you feel short of breath 18 g 2   ??? ALLERGY RELIEF, LORATADINE, 10 mg tablet TAKE 1 Tablet BY MOUTH ONCE DAILY 90 tablet 1   ??? ascorbic acid, vitamin C, (ASCORBIC ACID) 250 mg Chew Chew 1 tablet daily.     ??? atenoloL (TENORMIN) 100 MG tablet TAKE 1 Tablet BY MOUTH ONCE DAILY 90 tablet 2   ??? blood sugar diagnostic (ACCU-CHEK GUIDE TEST STRIPS) Strp Check sugars once daily and prn DX: E11.42, E11.9 100 each 11   ??? blood-glucose meter kit Use as instructed - pt prefers a larger monitor glucometer is available 1 each 0   ??? cholecalciferol, vitamin D3, (VITAMIN D3 ORAL) Take by mouth.      ??? clonazePAM (KLONOPIN) 0.5 MG tablet clonazepam 0.5 mg tablet   TAKE 1 TABLET (0.5 MG) BY MOUTH ONE TIME AS NEEDED ANXIETY     ??? divalproex (DEPAKOTE) 250 MG DR tablet Take 250 mg by mouth every evening.   1   ??? ferrous sulfate 325 (65 FE) MG tablet Take 1 tablet by mouth daily.     ??? flash glucose sensor (FLASH GLUCOSE SENSOR) kit by Other route every fourteen (14) days. 1 each 11   ??? furosemide (LASIX) 40 MG tablet Take 1 tablet (40 mg total) by mouth every other day. 30 tablet 5   ??? hydroCHLOROthiazide (HYDRODIURIL) 25 MG tablet TAKE ONE TABLET BY MOUTH EVERY DAY 30 tablet 5   ??? hydrOXYzine (ATARAX) 25 MG tablet TAKE ONE TABLET BY MOUTH TWICE DAILY and 2 TABLETS at bedtime for sleep     ??? imatinib (GLEEVEC) 400 MG tablet Take 1 tablet (400 mg total) by mouth daily. 30 tablet 11   ??? insulin detemir U-100 (LEVEMIR FLEXTOUCH U-100 INSULN) 100 unit/mL (3 mL) injection pen Inject 0.2 mL (20 Units total) under the skin daily. Increase by 2 units nightly until your fasting blood sugar is less than 150. 15 mL 12   ??? insulin lispro (HUMALOG KWIKPEN INSULIN) 100 unit/mL injection pen Check blood sugars before meals and bedtime.  Take sliding scale insulin as directed: for blood sugars < 150 = no insulin; blood sugars 151-200 = take 2 units; blood sugars 201-250 = 4 units; blood sugars 251-300 = 6 units; blood sugars 301-350 = 8 units; blood sugars 351-400 = take 10 units; blood sugars > 400 = take 12 units and recheck blood sugar in 1 hour and reapply sliding scale insulin (max dose insulin = 36 units).  If blood sugars remain elevated > 400 after taking max dose of insulin, go to nearest ER to be evaluated. 3 mL 12   ??? lamoTRIgine (LAMICTAL) 150 MG tablet Take 150 mg by mouth Two (2) times a day.     ??? lancets (ACCU-CHEK SOFTCLIX LANCETS) Misc Check sugar once daily and prn as directed E11.65 100 each 11   ??? pantoprazole (PROTONIX) 40 MG tablet TAKE ONE TABLET BY MOUTH EVERY DAY. 90 tablet 1   ??? pen needle, diabetic (NOVOFINE 32) 32 gauge x 1/4 (6 mm) Ndle 1 each by Miscellaneous route daily. Use with Victoza 100 each 11   ??? pregabalin (LYRICA) 75 MG capsule TAKE 1 CAPSULE BY MOUTH EVERY MORNING  and 2 EVERY EVENING 90 capsule 0   ??? traZODone (DESYREL) 100 MG tablet Take 200 mg by mouth nightly.      ??? valsartan (DIOVAN) 160 MG tablet TAKE ONE TABLET BY MOUTH EVERY DAY 30 tablet 5   ??? ziprasidone (GEODON) 80 MG capsule Take 80 mg by mouth 2 (two) times a day with meals.        No current facility-administered medications for this visit.       Allergies   Allergen Reactions   ??? Lisinopril Shortness Of Breath   ??? Naproxen Nausea Only, Palpitations and Other (See Comments)     Chest palpitations and feels like flying   ??? Aripiprazole Other (See Comments)     Inability to urinate     ??? Corn Containing Products Diarrhea   ??? Fluphenazine      mental health problems   ??? Lactase      Other reaction(s): Unknown   ??? Metoclopramide      Mania   ??? Milk Containing Products Diarrhea   ??? Prednisone Other (See Comments)     mania   ??? Reglan [Metoclopramide Hcl] Other (See Comments)     Induces mania   ??? Diphenhydramine Hcl Anxiety   ??? Multihance [Gadobenate Dimeglumine] Nausea And Vomiting   ??? Ondansetron Hcl Anxiety   ??? Promethazine Anxiety       Patient Active Problem List   Diagnosis   ??? Diabetes mellitus without complication (CMS-HCC)   ??? Esophageal reflux   ??? Essential (primary) hypertension   ??? Morbid obesity (CMS-HCC)   ??? Obstructive sleep apnea   ??? Polycystic ovarian syndrome   ??? Bipolar I disorder: With psychotic features, Current or most recent episode depressed, with mixed features (CMS-HCC)   ??? Posttraumatic stress disorder   ??? Decreased hearing of both ears   ??? Seborrheic dermatitis of scalp   ??? Pelvic mass   ??? Morbid obesity with body mass index (BMI) of 50.0 to 59.9 in adult (CMS-HCC) ??? Acute renal failure (CMS-HCC)   ??? Sepsis (CMS-HCC)   ??? Flu vaccine need   ??? Diarrhea   ??? Therapeutic drug monitoring   ??? H/O non anemic vitamin B12 deficiency   ??? Schizo affective schizophrenia (CMS-HCC)   ??? Anxiety   ??? Vaginal candida   ??? BMI 45.0-49.9, adult (CMS-HCC)   ??? Urinary frequency   ??? Thyromegaly   ??? Elevated platelet count   ??? Leukocytosis   ??? Uncontrolled type 2 diabetes mellitus with hyperglycemia (CMS-HCC)   ??? Type 2 diabetes mellitus without complication, with long-term current use of insulin (CMS-HCC)   ??? [name] (chronic myelocytic leukemia) (CMS-HCC)   ??? Fatty liver   ??? Focal nodular hyperplasia of liver       Reviewed and up to date in Epic.    Appropriateness of Therapy     Acute infections noted within Epic:  No active infections  Patient reported infection: None    Is medication and dose appropriate based on diagnosis and infection status? Yes    Prescription has been clinically reviewed: Yes      Baseline Quality of Life Assessment      How many days over the past month did your condition  keep you from your normal activities? For example, brushing your teeth or getting up in the morning. 0    Financial Information     Medication Assistance provided: None Required    Anticipated copay of $3 reviewed with patient. Verified delivery  address.    Delivery Information     Scheduled delivery date: 03/15/21    Expected start date: ~03/20/21    Medication will be delivered via UPS to the prescription address in [facility].  This shipment will not require a signature.      Explained the services we provide at [facility] and that each month we would call to set up refills.  Stressed importance of returning phone calls so that we could ensure they receive their medications in time each month.  Informed patient that we should be setting up refills 7-10 days prior to when they will run out of medication.  A pharmacist will reach out to perform a clinical assessment periodically. Informed patient that a welcome packet, containing information about our pharmacy and other support services, a Notice of Privacy Practices, and a drug information handout will be sent.      Patient verbalized understanding of the above information as well as how to contact the pharmacy at [phone] option 4 with any questions/concerns.  The pharmacy is open Monday through Friday 8:30am-4:30pm.  A pharmacist is available 24/7 via pager to answer any clinical questions they may have.    Patient Specific Needs     - Does the patient have any physical, cognitive, or cultural barriers? No    - Patient prefers to have medications discussed with  Patient     - Is the patient or caregiver able to read and understand education materials at a high school level or above? Yes    - Patient's primary language is  English     - Is the patient high risk? Yes, patient is taking oral chemotherapy. Appropriateness of therapy as been assessed    - Does the patient require a Care Management Plan? No     - Does the patient require physician intervention or other additional services (i.e. nutrition, smoking cessation, social work)? No      Sherrick Araki Vangie Bicker  [facility] Specialty Pharmacist

## 2021-03-18 ENCOUNTER — Ambulatory Visit: Admit: 2021-03-18 | Discharge: 2021-03-19 | Payer: MEDICAID

## 2021-03-18 ENCOUNTER — Ambulatory Visit: Admit: 2021-03-18 | Discharge: 2021-03-19 | Payer: MEDICAID | Attending: Internal Medicine | Primary: Internal Medicine

## 2021-03-18 DIAGNOSIS — E1165 Type 2 diabetes mellitus with hyperglycemia: Principal | ICD-10-CM

## 2021-03-18 DIAGNOSIS — C9210 Chronic myeloid leukemia, BCR/ABL-positive, not having achieved remission: Principal | ICD-10-CM

## 2021-03-18 DIAGNOSIS — K7689 Other specified diseases of liver: Principal | ICD-10-CM

## 2021-03-18 DIAGNOSIS — K760 Fatty (change of) liver, not elsewhere classified: Principal | ICD-10-CM

## 2021-03-18 DIAGNOSIS — Z794 Long term (current) use of insulin: Principal | ICD-10-CM

## 2021-03-18 MED ORDER — ACCU-CHEK GUIDE TEST STRIPS
11 refills | 0 days | Status: CP
Start: 2021-03-18 — End: ?

## 2021-03-18 NOTE — Unmapped (Signed)
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 pelvis 02/09/2020     TECHNIQUE: Axial CT images were obtained from the lung apices through the ischial tuberosities. Intravenous contrast was administered for the purposes of this exam.     FINDINGS: Neck/Thyroid: The thyroid gland is normal. The visible cervical soft tissues are normal.     Chest wall/Axilla: No chest wall masses or axillary lymphadenopathy.     Mediastinum/Hila: No mediastinal or hilar lymphadenopathy.  Pulmonary trunk is larger than the ascending aorta measuring 3 cm in diameter. The ascending aorta measures 2.8 cm. No aortic aneurysm or dissection identified.     Cardiac: The heart and pericardium are normal. Mild coronary vascular calcifications.     Large airways: The large airways are normal in caliber.     Lungs/Pleura: The lungs are well expanded and clear. No pneumothorax or pleural effusion is present.     Hepatobiliary: Liver is normal in size. Subtle lesion in the 7th hepatic segment with faint peripheral enhancement measures 3.9 x 3.0 x 3.2 cm (series 3:16). Faint enhancing lesion of the 6th hepatic segment measures 2.7 x 2.0 cm (series 604:79).     Spleen, pancreas, adrenals: The spleen, pancreas, and adrenal glands are within normal limits.     Kidneys and ureters: The kidneys are normal, symmetric in size and contrast enhancement. No hydronephrosis/hydroureter or obstructing nephrolithiasis is identified.     Urinary bladder: The urinary bladder is thin-walled and normal-appearing. Incidentally noted ureters.     Reproductive: The uterus is normal. The ovaries are normal. No unexpected adnexal masses are identified.     Bowel: The duodenal sweep is normal. The bowel is normal in caliber throughout its course without obstruction or wall thickening. The terminal ileum and the appendix are normal. No colonic abnormalities identified.     Lymphovascular: The abdominal aorta is normal in caliber. No morphologically abnormal or pathologically enlarged lymph nodes are identified.     Peritoneum/retroperitoneum: No free air or drainable fluid collection is identified.     Abdominal wall: The abdominal wall is intact, without bowel containing herniation.     Musculoskeletal: No lytic or sclerotic lesions suspicious for osseous metastasis is identified.             1. No abscess or drainable fluid collection is identified.     2. Variably enhancing hepatic lesions. The appearance is nonspecific but most consistent with hepatic hemangioma. Further characterization by ultrasound or liver mass protocol MRI is recommended.     3. No pulmonary consolidation.     4. No morphologically abnormal or pathologically enlarged lymph nodes identified.     Signed (Electronic Signature): 02/06/2021 9:52 PM Signed By: Karene Fry Powers    Laboratory results:    Lab on 03/12/2021   Component Date Value Ref Range Status   ??? Sodium 03/12/2021 131 (A) 135 - 145 mmol/L Final   ??? Potassium 03/12/2021 4.0  3.4 - 4.8 mmol/L Final   ??? Chloride 03/12/2021 95 (A) 98 - 107 mmol/L Final   ??? Anion Gap 03/12/2021 12  5 - 14 mmol/L Final   ??? CO2 03/12/2021 24.0  20.0 -  31.0 mmol/L Final   ??? BUN 03/12/2021 11  9 - 23 mg/dL Final   ??? Creatinine 03/12/2021 0.56 (A) 0.60 - 0.80 mg/dL Final   ??? BUN/Creatinine Ratio 03/12/2021 20   Final   ??? EGFR CKD-EPI Non-African American,* 03/12/2021 >90 >=60 mL/min/1.39m2 Final   ??? EGFR CKD-EPI African American, Fem* 03/12/2021 >90  >=60 mL/min/1.26m2 Final   ??? Glucose 03/12/2021 406 (A) 70 - 179 mg/dL Final   ??? Calcium 78/29/5621 9.8  8.7 - 10.4 mg/dL Final   ??? Albumin 30/86/5784 3.7  3.4 - 5.0 g/dL Final   ??? Total Protein 03/12/2021 7.2  5.7 - 8.2 g/dL Final   ??? Total Bilirubin 03/12/2021 0.3  0.3 - 1.2 mg/dL Final   ??? AST 69/62/9528 25  <=34 U/L Final   ??? ALT 03/12/2021 12  10 - 49 U/L Final   ??? Alkaline Phosphatase 03/12/2021 196 (A) 46 - 116 U/L Final   ??? WBC 03/12/2021 47.0 (A) 3.6 - 11.2 10*9/L Final   ??? RBC 03/12/2021 4.21  3.95 - 5.13 10*12/L Final   ??? HGB 03/12/2021 12.0  11.3 - 14.9 g/dL Final   ??? HCT 41/32/4401 37.5  34.0 - 44.0 % Final   ??? MCV 03/12/2021 88.9  77.6 - 95.7 fL Final   ??? MCH 03/12/2021 28.5  25.9 - 32.4 pg Final   ??? MCHC 03/12/2021 32.1  32.0 - 36.0 g/dL Final   ??? RDW 02/72/5366 15.1  12.2 - 15.2 % Final   ??? MPV 03/12/2021 8.6  6.8 - 10.7 fL Final   ??? Platelet 03/12/2021 555 (A) 150 - 450 10*9/L Final   ??? Neutrophils % 03/12/2021 79  % Final   ??? Lymphocytes % 03/12/2021 6  % Final   ??? Monocytes % 03/12/2021 3  % Final   ??? Eosinophils % 03/12/2021 6  % Final   ??? Basophils % 03/12/2021 6  % Final   ??? Absolute Neutrophils 03/12/2021 37.1 (A) 1.8 - 7.8 10*9/L Final   ??? Absolute Lymphocytes 03/12/2021 2.8  1.1 - 3.6 10*9/L Final   ??? Absolute Monocytes 03/12/2021 1.4 (A) 0.3 - 0.8 10*9/L Final   ??? Absolute Eosinophils 03/12/2021 2.8 (A) 0.0 - 0.5 10*9/L Final   ??? Absolute Basophils 03/12/2021 2.8 (A) 0.0 - 0.1 10*9/L Final   ??? Smear Review Comments 03/12/2021 See Comment (A) Undefined Final
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
## 2021-03-18 NOTE — Unmapped (Signed)
Referrals to nutrition and Dr Sander Radon our health coach     Diet guidelines:  Water 64oz per day  Stevia/monk fruit monk fruit  2 cups of veggies  25grams or less added sugar    Plan the next 24 hours        Please follow a 2000mg  sodium restricted diet  Increase your protein intake by eating eggs, chicken, fish, yogurt and/or supplements.       It is OK to take up to 2000mg  of acetaminophen (tylenol), please be cautious of combination products. Do not take ibuprofen, naproxen, aspirin, advil, aleve, motrin, Excedrin or headache powders. Avoid herbal or natural supplements.      Please contact my nurse, Josie Dixon, RN at [phone] If you have questions or concerns or need to get in touch with me.       Please Return to clinic in  3 months       If you have any questions or concerns please contact us on MyChart or as below:     For appointments, please call [phone].  For scheduling radiology appointments [phone].  For scheduling GI procedures (e.g,. Colonoscopy), please call [phone].  For emergencies after hours call [phone] and ask for the GI medicine fellow on call.          Jenna Sa, NP-C  [facility]  [address] [place], [place] [address]  [place], [place] [address]  Ph: [phone]  Fax: [phone]

## 2021-03-19 ENCOUNTER — Institutional Professional Consult (permissible substitution): Admit: 2021-03-19 | Discharge: 2021-03-20 | Payer: MEDICAID | Attending: Pharmacist | Primary: Pharmacist

## 2021-03-19 DIAGNOSIS — Z3162 Encounter for fertility preservation counseling: Principal | ICD-10-CM

## 2021-03-19 DIAGNOSIS — N979 Female infertility, unspecified: Principal | ICD-10-CM

## 2021-03-19 DIAGNOSIS — C9210 Chronic myeloid leukemia, BCR/ABL-positive, not having achieved remission: Principal | ICD-10-CM

## 2021-03-19 NOTE — Unmapped (Signed)
Note Pasted From [facility] For Continuity of Care    Jenna Mosley 03/19/2021 11:12 AM  HPI: Jenna Mosley is a 30 year old G0 presenting with her mother present to discuss fertility preservation in the setting of a new diagnosis of [name]. She is planning to start imatinib (Gleevec) tomorrow, but she believes she can wait on that if needed to pursue fertility preservation. Of note, she also reports that she has been intermittently trying to conceive with her boyfriend, most recently since 09/2020 when she stopped OCPs. Virtual visit due to COVID-19.     Her prior treatment history is summarized below (Epic chart review performed as well):     Pt has been told she has PCOS, on OCPs since age 52. Stopped OCPs in 09/2020   Liver masses and fatty liver ??? thought to be benign ??? for fu scan in 6 months   Chronic phase [name] ??? last seen by hematology in 02/2021, planning for Gleevec???per note by Dr. Lonni Fix, this will be taken indefinitely   Preconception:     She strongly desires one child at some point but plans to hold on attempts to conceive at this time given her [name] diagnosis   Ob History: G0     Gyn History:     Menses: irregular lasting 4-7 days. Reports roughly monthly menses since stopping OCPs 09/2020. LMP ~03/04/2020.   Dysmenorrhea: 8/10   Hx STI: HPV   Last pap UTD (ASCUS HPV neg 04/2020-Epic reviewed); endorses a hx of abnormal paps s/p colposcopy   Biometrics: 61ft, 329lb, 44.6kg/m2; height/weight/BMI     PMH: [name], obesity, poorly controlled IDDM (c/b diabetic neuropathy), HTN (well controlled on meds), bipolar, fatty liver disease/liver masses, GERD     PSH: tonsils, lymphangeoma removal (2003), WTE     Meds: levemir 80U, Humalog before meals, loratadine, atenolol, pantoprazole, pregabalin (for diabetic neuropathy), HCTZ, valsartan, albuterol PRN, furosemide every other day, ferrous sulfate, geodone, lamictal, Depakote, lamictal     Allergies: Lisinopril, naproxen, aripiprazole, fluphenazine, lactase, reglan, prednisone, Benadryl, gadopenate, odansetron, promethazine     Social History: Denies smoking, alcohol, or other drug use     Occupation: on disability   Race: Caucasian, Native American   Family hx: Significant FH of cancer: Maternal great grandmother (breast and lymphoma), maternal grandmother (breast, melanoma, cervical), maternal grandfather (prostate), great uncle (melanoma), uncle (melanoma), mother (SCC of the skin). Significant FH of diabetes and mental illness. She doesn???t know her father???s FH well.     Genetic disorders - no mental retardation or cystic fibrosis or sickle cell disease on either side   PARTNER: Inda Castle-- dating, had been TTC together but she is moving and is unsure if this relationship will continue     - Health problems: diabetes, obesity (weighs >500lb)        - He is not present at the virtual appointment today     Physical exam: not done - virtual visit     ASSESSMENT ???      30 yo G0 with multiple medical morbidities including obesity (BMI 44), poorly controlled IDDM, HTN, bipolar disease, NAFLD with liver masses, GERD, and a new diagnosis of [name] who is interested in fertility preservation and future fertility. She is currently on Lasix QOD to prevent pulmonary edema.  COUNSELING ???  - We discussed at length all aspects of cancer and fertility. We first discussed that it is unknown exactly what effect her chemotherapy will have on her fertility potential in  the future. We discussed the paucity of data on Gleevec and future fertility, but that data from animal studies suggests that it could have an adverse impact on fertility.  - We discussed emergency IVF, with embryo or oocyte cryopreservation. Embryo cryopreservation is standard treatment with routine IVF, egg banking has more 'unknowns' regarding thaw survival, fert, PR. I reviewed that eIVF with embryo or oocyte cryo would involve undergoing ovarian stimulation, which would take approximately 2 weeks, and would require an oocyte retrieval in the dorsal lithotomy position in our clinic, without the ability for intubation or advanced airway support if needed.     -I explained to Jenna Mosley and her mother that I have very significant concerns regarding the risk of IVF alone (in particular, the risk of OHSS in the setting of young age, PCOS, and baseline Lasix requirement for pulmonary edema), in addition to the risks related to the oocyte retrieval outside of a controlled OR setting. I informed her that with a BMI>40 the egg retrieval procedure may be more surgically challenging, and her airway may be at higher risk of compromise ??? without baseline lung issues. I told her that I would discuss her case with our IVF team to determine whether it would be medically reasonable to attempt IVF in her case.      - We also discussed using lupron during chemo. I told her that while this is commonly done to attempt to protect the ovaries, there is conflicting evidence about the efficacy of this treatment strategy, in terms of protecting future fertility potential. We discussed that if she does not chose another option, it is unlikely that lupron will HURT her future fertility, and will alleviate any dysmenorrhea during her chemo. She would likely have temporary menopausal symptoms during treatment.     - We also briefly discussed the financial aspects of these treatments, and I gave her the contact information for our financial counselor and will ask for our fertility preservation coordinator to reach out.  - Encourage [facility] - regardless of treatment plan. She agrees to this and I ordered it for her through Epic.  All questions were answered. The visit was approximately 60 minutes long and all was spent in direct face-to-face counseling. All questions were answered.  I spent 60 minutes on the audio/video teleconference with the patient. I spent an additional 15 minutes on pre- and post-visit activities.      The patient was physically located in [place] or a state in which I am permitted to provide care. The patient understood that he may incur co-pays and cost sharing, and agreed to the telemedicine visit. The visit was completed via video, which was appropriate and reasonable under the circumstances given the patient's presentation at the time.  The patient has been advised of the potential risks and limitations of this mode of treatment (including, but not limited to, the absence of in-person examination) and has agreed to be treated using telemedicine. The patient's questions regarding telemedicine have been answered.                   Addendum: I spoke with Drs. Bressler and Mersereau about Jenna Mosley???s case today and we agreed that we cannot safely offer oocyte cryopreservation to her at our center. I called Jenna Mosley and explained the rationale for this and that our desire first and foremost is for her safety. I recommended AMH level now, Lupron during treatment, and follow up with me when she is medically fit for pregnancy.

## 2021-03-19 NOTE — Unmapped (Signed)
[name] is a 30 y.o. female with [name] who I am seeing in clinic today for oral chemotherapy education    Encounter Date: 03/19/2021    Current Treatment: imatinib 400 mg daily (D1 = 03/19/21)    For oral chemotherapy:  Pharmacy: [facility]   Medication Access: $3/month, medicaid    Interval History: I called Jenna Mosley today to discuss initiation of imatinib for her new dx [name]. Jenna Mosley has a variety of comorbidities that went into choosing TKI therapy, and also to consider while on imatinib therapy as they can be exacerbated. Some of these include underlying restrictive lung disease, uncontrolled DM/obesity, diarrhea (10 BMs per day). Additionally, she has struggled with infertility, and saw a specialist today who discussed lupron. Plans to restart OCP.    On labs from 4/26, WBC 47, PLT 555, Hgb 12. CMP notable for BG 406. BCR-ABL at diagnosis 35.703% (from 4/14)    Oncologic History:  Oncology History    No history exists.       Weight and Vitals:  Wt Readings from Last 3 Encounters:   03/18/21 (!) 151.2 kg (333 lb 6.4 oz)   03/18/21 (!) 149.6 kg (329 lb 12.8 oz)   03/12/21 (!) 149.7 kg (330 lb)     Temp Readings from Last 3 Encounters:   03/18/21 36.2 ??C (97.1 ??F) (Temporal)   03/18/21 36.6 ??C (97.9 ??F) (Temporal)   03/12/21 36.1 ??C (97 ??F) (Tympanic)     BP Readings from Last 3 Encounters:   03/18/21 109/65   03/18/21 115/68   03/12/21 121/69     Pulse Readings from Last 3 Encounters:   03/18/21 75   03/18/21 65   03/12/21 63       Pertinent Labs:  No visits with results within 1 Day(s) from this visit.   Latest known visit with results is:   Lab on 03/12/2021   Component Date Value Ref Range Status   ??? Sodium 03/12/2021 131 (A) 135 - 145 mmol/L Final   ??? Potassium 03/12/2021 4.0  3.4 - 4.8 mmol/L Final   ??? Chloride 03/12/2021 95 (A) 98 - 107 mmol/L Final   ??? Anion Gap 03/12/2021 12  5 - 14 mmol/L Final   ??? CO2 03/12/2021 24.0  20.0 - 31.0 mmol/L Final   ??? BUN 03/12/2021 11  9 - 23 mg/dL Final   ??? Creatinine 03/12/2021 0.56 (A) 0.60 - 0.80 mg/dL Final   ??? BUN/Creatinine Ratio 03/12/2021 20   Final   ??? EGFR CKD-EPI Non-African American,* 03/12/2021 >90  >=60 mL/min/1.72m2 Final   ??? EGFR CKD-EPI African American, Fem* 03/12/2021 >90  >=60 mL/min/1.28m2 Final   ??? Glucose 03/12/2021 406 (A) 70 - 179 mg/dL Final   ??? Calcium 16/08/9603 9.8  8.7 - 10.4 mg/dL Final   ??? Albumin 54/07/8118 3.7  3.4 - 5.0 g/dL Final   ??? Total Protein 03/12/2021 7.2  5.7 - 8.2 g/dL Final   ??? Total Bilirubin 03/12/2021 0.3  0.3 - 1.2 mg/dL Final   ??? AST 14/78/2956 25  <=34 U/L Final   ??? ALT 03/12/2021 12  10 - 49 U/L Final   ??? Alkaline Phosphatase 03/12/2021 196 (A) 46 - 116 U/L Final   ??? WBC 03/12/2021 47.0 (A) 3.6 - 11.2 10*9/L Final   ??? RBC 03/12/2021 4.21  3.95 - 5.13 10*12/L Final   ??? HGB 03/12/2021 12.0  11.3 - 14.9 g/dL Final   ??? HCT 21/30/8657 37.5  34.0 - 44.0 % Final   ???  MCV 03/12/2021 88.9  77.6 - 95.7 fL Final   ??? MCH 03/12/2021 28.5  25.9 - 32.4 pg Final   ??? MCHC 03/12/2021 32.1  32.0 - 36.0 g/dL Final   ??? RDW 56/21/3086 15.1  12.2 - 15.2 % Final   ??? MPV 03/12/2021 8.6  6.8 - 10.7 fL Final   ??? Platelet 03/12/2021 555 (A) 150 - 450 10*9/L Final   ??? Neutrophils % 03/12/2021 79  % Final   ??? Lymphocytes % 03/12/2021 6  % Final   ??? Monocytes % 03/12/2021 3  % Final   ??? Eosinophils % 03/12/2021 6  % Final   ??? Basophils % 03/12/2021 6  % Final   ??? Absolute Neutrophils 03/12/2021 37.1 (A) 1.8 - 7.8 10*9/L Final   ??? Absolute Lymphocytes 03/12/2021 2.8  1.1 - 3.6 10*9/L Final   ??? Absolute Monocytes 03/12/2021 1.4 (A) 0.3 - 0.8 10*9/L Final   ??? Absolute Eosinophils 03/12/2021 2.8 (A) 0.0 - 0.5 10*9/L Final   ??? Absolute Basophils 03/12/2021 2.8 (A) 0.0 - 0.1 10*9/L Final   ??? Smear Review Comments 03/12/2021 See Comment (A) Undefined Final    Slide Reviewed. Myelocytes present.       Allergies:   Allergies   Allergen Reactions   ??? Lisinopril Shortness Of Breath   ??? Naproxen Nausea Only, Palpitations and Other (See Comments)     Chest palpitations and feels like flying   ??? Aripiprazole Other (See Comments)     Inability to urinate     ??? Fluphenazine      mental health problems   ??? Lactase      Other reaction(s): Unknown   ??? Metoclopramide      Mania   ??? Prednisone Other (See Comments)     mania   ??? Reglan [Metoclopramide Hcl] Other (See Comments)     Induces mania   ??? Diphenhydramine Hcl Anxiety   ??? Multihance [Gadobenate Dimeglumine] Nausea And Vomiting   ??? Ondansetron Hcl Anxiety   ??? Promethazine Anxiety       Drug Interactions: none; imatinib is a moderate 3A4 inh and is also a substrate   - Monitor QTc on ziprasidone plus TKIs      Current Medications:  Current Outpatient Medications   Medication Sig Dispense Refill   ??? albuterol HFA 90 mcg/actuation inhaler Inhale 2 puffs Take as directed. Take puff before exercise and you can take an additional puff while you are exercising if you feel short of breath 18 g 2   ??? ALLERGY RELIEF, LORATADINE, 10 mg tablet TAKE 1 Tablet BY MOUTH ONCE DAILY 90 tablet 1   ??? atenoloL (TENORMIN) 100 MG tablet TAKE 1 Tablet BY MOUTH ONCE DAILY 90 tablet 2   ??? blood sugar diagnostic (ACCU-CHEK GUIDE TEST STRIPS) Strp Check sugars before meals and before bedtime.  E11.9 100 each 11   ??? blood-glucose meter kit Use as instructed - pt prefers a larger monitor glucometer is available 1 each 0   ??? cholecalciferol, vitamin D3, (VITAMIN D3 ORAL) Take by mouth daily.      ??? clonazePAM (KLONOPIN) 0.5 MG tablet Take 1 mg by mouth daily as needed for anxiety.      ??? divalproex (DEPAKOTE) 250 MG DR tablet Take 250 mg by mouth every evening.   1   ??? ferrous sulfate 325 (65 FE) MG tablet Take 1 tablet by mouth daily.     ??? furosemide (LASIX) 40 MG tablet Take 1 tablet (40 mg  total) by mouth every other day. 30 tablet 5   ??? hydroCHLOROthiazide (HYDRODIURIL) 25 MG tablet TAKE ONE TABLET BY MOUTH EVERY DAY 30 tablet 5   ??? imatinib (GLEEVEC) 400 MG tablet Take 1 tablet (400 mg total) by mouth daily. 30 tablet 11   ??? insulin detemir U-100 (LEVEMIR FLEXTOUCH U-100 INSULN) 100 unit/mL (3 mL) injection pen Inject 0.2 mL (20 Units total) under the skin daily. Increase by 2 units nightly until your fasting blood sugar is less than 150. (Patient taking differently: Inject 80 Units under the skin daily. Increase by 2 units nightly until your fasting blood sugar is less than 150.) 15 mL 12   ??? insulin lispro (HUMALOG KWIKPEN INSULIN) 100 unit/mL injection pen Check blood sugars before meals and bedtime.  Take sliding scale insulin as directed: for blood sugars < 150 = no insulin; blood sugars 151-200 = take 2 units; blood sugars 201-250 = 4 units; blood sugars 251-300 = 6 units; blood sugars 301-350 = 8 units; blood sugars 351-400 = take 10 units; blood sugars > 400 = take 12 units and recheck blood sugar in 1 hour and reapply sliding scale insulin (max dose insulin = 36 units).  If blood sugars remain elevated > 400 after taking max dose of insulin, go to nearest ER to be evaluated. 3 mL 12   ??? lamoTRIgine (LAMICTAL) 150 MG tablet Take 150 mg by mouth Two (2) times a day.     ??? pantoprazole (PROTONIX) 40 MG tablet TAKE ONE TABLET BY MOUTH EVERY DAY. 90 tablet 1   ??? pregabalin (LYRICA) 75 MG capsule TAKE 1 CAPSULE BY MOUTH EVERY MORNING and 2 EVERY EVENING 90 capsule 0   ??? traZODone (DESYREL) 100 MG tablet Take 200 mg by mouth nightly.      ??? valsartan (DIOVAN) 160 MG tablet TAKE ONE TABLET BY MOUTH EVERY DAY 30 tablet 5   ??? ziprasidone (GEODON) 80 MG capsule Take 80 mg by mouth 2 (two) times a day with meals.      ??? flash glucose sensor (FLASH GLUCOSE SENSOR) kit by Other route every fourteen (14) days. 1 each 11   ??? lancets (ACCU-CHEK SOFTCLIX LANCETS) Misc Check sugar once daily and prn as directed E11.65 100 each 11   ??? pen needle, diabetic (NOVOFINE 32) 32 gauge x 1/4 (6 mm) Ndle 1 each by Miscellaneous route daily. Use with Victoza 100 each 11     No current facility-administered medications for this visit.       Adherence: No issues identified; emphasized importance of adherence      Assessment: [name] is a 30 y.o. female with [name] being treated currently with imatinib 400 mg daily (D1 = 5/3)    Plan:   - Can start imatinib 400 mg daily. Take with food  - Discussed educational points for imatinib including but not limited to: refills monthly from [facility], safe handling, pregnancy prevention, administration, ADRs (including GI, rash, edema, periorbital edema, MSK pain, fatigue), and monitoring  - The fertility specialist suggested possible utility of lupron for preservation as they utilize in breast cancer patients. I explained the difference with her treatment is that a) this chemo (imatinib) is not gonadotoxic and b) it's to be continued for an indefinite amount of time. From my perspective the goal for now is likely contraception, and if we can get her eligible for a trial of discontinuation that would be the best window to conceive. However barring previous issues with fertility, open to any  help from specialists that could increase her chance of conceiving in the future. She will restart OCP for now while getting started on imatinib  - I am happy to send rx's or make recommendations for her as it relates to ADRs in the future  - Labs/APP visit on 5/24. I will set her up with a phone call in about 2 weeks to assess for ADRs    F/u:  Future Appointments   Date Time Provider Department Center   04/09/2021  3:00 PM ADULT ONC LAB [facility]   04/09/2021  4:00 PM Sean Marrian Salvage, AGNP [id number] [facility]   05/14/2021  1:00 PM Nurum Joeseph Amor, MD [facility]   06/18/2021  3:20 PM Vanessa Watson, FNP JMAKENFP [facility]   06/20/2021  3:30 PM Tarri Abernethy, FNP UNCDIABENDET [facility]   09/23/2021  3:20 PM Vikki Desmond Lope, ANP UNCGIMEDET [facility]         Manfred Arch, PharmD, BCOP, CPP  Pager: [phone]        The patient reports they are currently: at home. I spent 25 minutes on the phone with the patient on the date of service. I spent an additional 10 minutes on pre- and post-visit activities on the date of service.     The patient was physically located in [place] or a state in which I am permitted to provide care. The patient and/or parent/guardian understood that s/he may incur co-pays and cost sharing, and agreed to the telemedicine visit. The visit was reasonable and appropriate under the circumstances given the patient's presentation at the time.    The patient and/or parent/guardian has been advised of the potential risks and limitations of this mode of treatment (including, but not limited to, the absence of in-person examination) and has agreed to be treated using telemedicine. The patient's/patient's family's questions regarding telemedicine have been answered.     If the visit was completed in an ambulatory setting, the patient and/or parent/guardian has also been advised to contact their provider???s office for worsening conditions, and seek emergency medical treatment and/or call 911 if the patient deems either necessary.

## 2021-03-20 NOTE — Unmapped (Signed)
BP Readings from Last 3 Encounters:   03/18/21 109/65   03/18/21 115/68   03/12/21 121/69     BP controlled at this time.  Pt is to continue with current medication therapy.  Continue to watch sodium intake, exercise as tolerated.

## 2021-03-20 NOTE — Unmapped (Signed)
>>  ASSESSMENT AND PLAN FOR DIABETES MELLITUS WITHOUT COMPLICATION (CMS-HCC) WRITTEN ON 03/20/2021 10:09 AM BY Vanessa Karnak, FNP    Patient has been referred to endocrinology.  Continue with current regimen.  Patient again educated on how to take SSI.  Patient encouraged to follow diabetic diet and exercise as tolerated.    Lab Results   Component Value Date    A1C 9.6 (H) 02/06/2021

## 2021-03-20 NOTE — Unmapped (Signed)
Fertility Preservation Follow-up     I called Jenna Mosley to follow up with her after the appointment she had with [facility] yesterday. I inquired about how she was doing in terms of processing difficult news, and expressed my willingness to provide psychosocial support as needed. She declined any current needs. I asked if the patient had any additional questions at this time, and she stated that she did not. I encouraged her to reach out to me in the future if that changes or if I can be of any assistance.      Thank you for the consultation request. I am happy tore connect with this patient as needed. Please feel free to contact me with questions.      [place] Lodato Hunt, MSW, LCSW  [facility] Fertility Preservation Clinical Coordinator   Pager: [phone]  Phone: [phone]

## 2021-03-20 NOTE — Unmapped (Addendum)
Patient has been referred to endocrinology.  Continue with current regimen.  Patient again educated on how to take SSI.  Patient encouraged to follow diabetic diet and exercise as tolerated.    Lab Results   Component Value Date    A1C 9.6 (H) 02/06/2021

## 2021-03-26 ENCOUNTER — Institutional Professional Consult (permissible substitution): Admit: 2021-03-26 | Discharge: 2021-03-27 | Payer: MEDICAID | Attending: Registered" | Primary: Registered"

## 2021-03-26 MED ORDER — LAMOTRIGINE 200 MG TABLET
Freq: Two times a day (BID) | ORAL | 0 days
Start: 2021-03-26 — End: ?

## 2021-03-26 MED ORDER — FERROUS SULFATE 325 MG (65 MG IRON) TABLET
0 days
Start: 2021-03-26 — End: 2021-04-10

## 2021-03-26 NOTE — Unmapped (Signed)
[facility] Outpatient Nutrition Services   Medical Nutrition Therapy Consultation       Visit Type:    Initial Assessment    Referral Reason: :    medical nutrition therapy for Diabetes, NASH      Jenna Mosley is a 30 y.o. female seen for medical nutrition therapy for Diabetes, NASH. Her active problem list, medication list, allergies and lab results were reviewed.     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     Anthropometrics   Height: 182.9 cm (6' 0.01)   Weight: (!) 149.2 kg (329 lb)   Body mass index is 44.61 kg/m??.    Wt Readings from Last 5 Encounters:   03/28/21 (!) 149.2 kg (329 lb)   03/18/21 (!) 151.2 kg (333 lb 6.4 oz)   03/18/21 (!) 149.6 kg (329 lb 12.8 oz)   03/12/21 (!) 149.7 kg (330 lb)   03/05/21 (!) 150 kg (330 lb 12 oz)        Usual body weight: history of weight over 400 lbs   ; Weights have fluctuated between 330-340 lb over the past year  Ideal Body Weight: 72.59 kg  Weight in (lb) to have BMI = 25: 184    Nutrition Risk Screening:   Food Insecurity: Not on file        Nutrition Focused Physical Exam:    Pending ; Visit was conducted over video    Malnutrition Screening:     Pending NFPE    Biochemical Data, Medical Tests and Procedures:  All pertinent labs and imaging reviewed by Gladys Damme at 2:46 PM 03/28/2021.    Lab Results   Component Value Date    A1C 9.6 (H) 02/06/2021    A1C 9.2 (A) 02/04/2021    A1C 7.5 (A) 11/06/2020    GLU 406 (HH) 03/12/2021     Lab Results   Component Value Date    ALKPHOS 196 (H) 03/12/2021    BILITOT 0.3 03/12/2021    BILIDIR <0.10 01/08/2018    PROT 7.2 03/12/2021    ALBUMIN 3.7 03/12/2021    ALT 12 03/12/2021    AST 25 03/12/2021     Lab Results   Component Value Date    IRON 60 08/17/2019    TIBC 560.2 (H) 08/17/2019    FERRITIN 14.4 08/17/2019         Medications and Vitamin/Mineral Supplementation:   All nutritionally pertinent medications reviewed on 03/28/2021.   She is taking nutrition supplements. Vitamin D Current Outpatient Medications   Medication Sig Dispense Refill   ??? albuterol HFA 90 mcg/actuation inhaler Inhale 2 puffs Take as directed. Take puff before exercise and you can take an additional puff while you are exercising if you feel short of breath 18 g 2   ??? ALLERGY RELIEF, LORATADINE, 10 mg tablet TAKE 1 Tablet BY MOUTH ONCE DAILY 90 tablet 1   ??? atenoloL (TENORMIN) 100 MG tablet TAKE 1 Tablet BY MOUTH ONCE DAILY 90 tablet 2   ??? blood sugar diagnostic (ACCU-CHEK GUIDE TEST STRIPS) Strp Check sugars before meals and before bedtime.  E11.9 100 each 11   ??? blood-glucose meter kit Use as instructed - pt prefers a larger monitor glucometer is available 1 each 0   ??? cholecalciferol, vitamin D3, (VITAMIN D3 ORAL) Take by mouth daily.      ??? clonazePAM (KLONOPIN) 0.5 MG tablet Take 1 mg by mouth daily as needed for anxiety.      ???  divalproex (DEPAKOTE) 250 MG DR tablet Take 250 mg by mouth every evening.   1   ??? ferrous sulfate 325 (65 FE) MG tablet Take 1 tablet by mouth daily.     ??? flash glucose sensor (FLASH GLUCOSE SENSOR) kit by Other route every fourteen (14) days. 1 each 11   ??? furosemide (LASIX) 40 MG tablet Take 1 tablet (40 mg total) by mouth every other day. 30 tablet 5   ??? hydroCHLOROthiazide (HYDRODIURIL) 25 MG tablet TAKE ONE TABLET BY MOUTH EVERY DAY 30 tablet 5   ??? imatinib (GLEEVEC) 400 MG tablet Take 1 tablet (400 mg total) by mouth daily. 30 tablet 11   ??? insulin detemir U-100 (LEVEMIR FLEXTOUCH U-100 INSULN) 100 unit/mL (3 mL) injection pen Inject 0.2 mL (20 Units total) under the skin daily. Increase by 2 units nightly until your fasting blood sugar is less than 150. (Patient taking differently: Inject 80 Units under the skin daily. Increase by 2 units nightly until your fasting blood sugar is less than 150.) 15 mL 12   ??? insulin lispro (HUMALOG KWIKPEN INSULIN) 100 unit/mL injection pen Check blood sugars before meals and bedtime.  Take sliding scale insulin as directed: for blood sugars < 150 = no insulin; blood sugars 151-200 = take 2 units; blood sugars 201-250 = 4 units; blood sugars 251-300 = 6 units; blood sugars 301-350 = 8 units; blood sugars 351-400 = take 10 units; blood sugars > 400 = take 12 units and recheck blood sugar in 1 hour and reapply sliding scale insulin (max dose insulin = 36 units).  If blood sugars remain elevated > 400 after taking max dose of insulin, go to nearest ER to be evaluated. 3 mL 12   ??? lamoTRIgine (LAMICTAL) 150 MG tablet Take 150 mg by mouth Two (2) times a day.     ??? lancets (ACCU-CHEK SOFTCLIX LANCETS) Misc Check sugar once daily and prn as directed E11.65 100 each 11   ??? levonorgestrel-ethinyl estradiol (AVIANE,ALESSE,LESSINA) 0.1-20 mg-mcg per tablet Take 1 tablet by mouth daily.     ??? pantoprazole (PROTONIX) 40 MG tablet TAKE ONE TABLET BY MOUTH EVERY DAY. 90 tablet 1   ??? pen needle, diabetic (NOVOFINE 32) 32 gauge x 1/4 (6 mm) Ndle 1 each by Miscellaneous route daily. Use with Victoza 100 each 11   ??? pregabalin (LYRICA) 75 MG capsule TAKE 1 CAPSULE BY MOUTH EVERY MORNING and 2 EVERY EVENING 90 capsule 0   ??? traZODone (DESYREL) 100 MG tablet Take 200 mg by mouth nightly.      ??? valsartan (DIOVAN) 160 MG tablet TAKE ONE TABLET BY MOUTH EVERY DAY 30 tablet 5   ??? ziprasidone (GEODON) 80 MG capsule Take 80 mg by mouth 2 (two) times a day with meals.        No current facility-administered medications for this visit.       Nutrition History:     Dietary Restrictions:   Spices like [place] or highly seasoned foods   Corn, milk/ice cream - stomach upset  Canned cheese   pinapple - mouth bleading     Gastrointestinal Issues:   Nauseated with [name] medication  Reflux controlled with protonix   Loose stools 8-10 times per day     Hunger and Satiety: strong appetite     Food Safety and Access: She has SNAP/EBT benefits. Healthier foods tend to cost more     Diet Recall:   Time Intake   Breakfast Skips (wake up  at 8-11am)   Snack (AM)    Lunch 2 spinach wrap with [place], lunch meat    Snack (PM) Berries mix    Dinner [place] food rice and beans and burrito with ground beef, salsa    Snack (HS)      Food-Related History:  Snacks: fruit or pork rines   Beverages:   Water, diet soda (cheerwine, mt dew, cherry coke), diet cranberry juice, seltzer water 'ice sparklers' (gallon total per day), splenda and splash of cream with coffee.   Usual Food Choices: fresh vegetables and fruits, berries,      Eating Behaviors:  Overeating: reports history of binge eating disorder, challenges with portion sizes at times       Physical Activity:  Activity level is impacted by fatigue     Daily Estimated Nutritional Needs:  Energy: 1610-9604 kcals [ (11-14 kcal/kg) using last recorded weight, 149 kg (03/28/21 1434)]  Protein: 110-146 gm [1.5-2.0 gm/kg using ideal body weight, 73 kg (03/28/21 1434)]  Carbohydrate:   [45-60% of kcal]  Fluid:   [1 mL/kcal (maintenance)]    Nutrition Goals & Evaluation      Adequate protein/calorie intakes throughout cancer treatment  (New)  Small meals/snacks every 2-3 hours while awake(New)  Balanced diet as much as tolerated  (New)    Nutrition goals reviewed, and relevant barriers identified and addressed: none evident. She is evaluated to have good willingness and ability to achieve nutrition goals.     Nutrition Assessment       Patient reports strong appetite however nausea as side effect of cancer treatment. She reports overall improvement in diarrhea, but still has 10-15 loose stools per day. She has been trying to follow a ketogenic style diet. Emphasized importance of adequate protein/caloric intakes during cancer treatment. Encouraged small/frequent meals and snacks ever 2-3 hours while awake and good protein sources with each meal. Additionally we discussed consistent carb intakes, and and balanced Mediterranean style diet as tolerated. To aid in glycemic control and given history of NASH    Nutrition Intervention      - Nutrition Education: Balanced, consistent carb, Mediteranean style diet, nutritoin impact of symtoms during cancer treatment diet/eating style. Education resources provided include: handouts promoting nutrition intervention , lists of foods recommended and foods not recommended and online resources     Nutrition Plan:   Patient educated and given handout on Mediterranean diet, including the following recommendations  Eat mostly plant-based foods, replacing red meat with beans and legumes (such as peanuts)  Consume at least 5 servings of fruits and vegetables per day. Aim for half of plate to be fruits and vegetables.  Choose whole grains over processed grains. Whole grains can be identified by the word whole on the label.   Eat at least 3 small handfuls of nuts per week  Use olive oil instead of other fats.   Use herbs and spices instead of salt to flavor foods  Eat seafood at least 3 times per week, but avoid fried fish  Eat poultry like chicken and [place] at least twice a week  Limit red meat consumption to 2-3 times per month  Avoid added sugars.   Strongly avoid sugar-sweetened beverages such as soda and juice   Avoid stick butter/margarine  Avoid fast food and highly-processed foods  Patient was informed that the Mediterranean diet can reduce liver fat even without weight loss.   Patient was informed that one study found that people who consumed sugar-sweetened beverages such as soft drinks  were more likely to develop fatty liver than those who didn't drink SSBs. Additionally, cola soft drinks contain caramel coloring which may exacerbate liver injury.   Patient was informed that drinking unsweetened coffee may reduce the risk of progression of NAFLD.   ??? Suggest small/frequent meals throughout the day  ??? Consistent carb intakes and adequate protein with meals and snacks as tolerated    Follow up will occur in 3-4 months.       Food/Nutrition-related history, Anthropometric measurements, Biochemical data, medical tests, procedures and Effectiveness of nutrition interventions will be assessed at time of follow-up.         The patient reports they are currently: at home. I spent 45 minutes on the real-time audio and video visit with the patient on the date of service. I spent an additional 15 minutes on pre- and post-visit activities on the date of service.     The patient was not located and I was located within 250 yards of a hospital based location during the real-time audio and video visit. The patient was physically located in [place] or a state in which I am permitted to provide care. The patient and/or parent/guardian understood that s/he may incur co-pays and cost sharing, and agreed to the telemedicine visit. The visit was reasonable and appropriate under the circumstances given the patient's presentation at the time.    The patient and/or parent/guardian has been advised of the potential risks and limitations of this mode of treatment (including, but not limited to, the absence of in-person examination) and has agreed to be treated using telemedicine. The patient's/patient's family's questions regarding telemedicine have been answered.    If the visit was completed in an ambulatory setting, the patient and/or parent/guardian has also been advised to contact their provider???s office for worsening conditions, and seek emergency medical treatment and/or call 911 if the patient deems either necessary.    [address] MS, RD, LD, CNSC  [phone]

## 2021-03-28 NOTE — Unmapped (Signed)
PATIENT WAS SEEN AT [facility] IN [place] ON 03/18/2021.

## 2021-04-02 ENCOUNTER — Ambulatory Visit: Admit: 2021-04-02 | Discharge: 2021-04-03 | Payer: MEDICAID

## 2021-04-02 ENCOUNTER — Institutional Professional Consult (permissible substitution): Admit: 2021-04-02 | Discharge: 2021-04-03 | Payer: MEDICAID | Attending: Pharmacist | Primary: Pharmacist

## 2021-04-02 ENCOUNTER — Ambulatory Visit
Admit: 2021-04-02 | Discharge: 2021-04-03 | Payer: MEDICAID | Attending: Adolescent Medicine | Primary: Adolescent Medicine

## 2021-04-02 DIAGNOSIS — Z794 Long term (current) use of insulin: Principal | ICD-10-CM

## 2021-04-02 DIAGNOSIS — E1165 Type 2 diabetes mellitus with hyperglycemia: Principal | ICD-10-CM

## 2021-04-02 DIAGNOSIS — S90211A Contusion of right great toe with damage to nail, initial encounter: Principal | ICD-10-CM

## 2021-04-02 DIAGNOSIS — Z6841 Body Mass Index (BMI) 40.0 and over, adult: Principal | ICD-10-CM

## 2021-04-02 DIAGNOSIS — C9210 Chronic myeloid leukemia, BCR/ABL-positive, not having achieved remission: Principal | ICD-10-CM

## 2021-04-02 MED ORDER — FLUCONAZOLE 150 MG TABLET
ORAL_TABLET | 0 refills | 0 days | Status: CP
Start: 2021-04-02 — End: ?

## 2021-04-02 MED ORDER — CEPHALEXIN 500 MG CAPSULE
ORAL_CAPSULE | Freq: Three times a day (TID) | ORAL | 0 refills | 10 days | Status: CP
Start: 2021-04-02 — End: 2021-04-12

## 2021-04-02 NOTE — Unmapped (Signed)
In order to get fasting sugars below 140 (goal is less than 120):  1. Do not drink anything that has sugar in it.    2.  Cut way back on bread rice pasta potatoe = sugars.   3. Chicken fish pork no sugar.   Just protein  4.vegatives are good. Low in sugars and stability sugars     5.New MD will start ozempic for weight reduction and improved sugar control.      In meantime:  To up to 100u at bed.   If fasting are not less than 140 then start 20u in AM and every 2 days go up by 10u until the sugars are going below 140.       Keflex 500 3 times per day for 7days for toe\  Soap water then bacitracin.   Cover with bandaid 1 week.   No open toed shoes for 2 weeks.   Nail might come off.        Both great toes have a nail fungus which is not uncommon.

## 2021-04-02 NOTE — Unmapped (Incomplete)
Jenna Mosley is a 30 y.o. female with [name] who I am seeing in clinic today for oral chemotherapy monitoring    Encounter Date: 04/02/2021    Current Treatment: imatinib 400 mg daily (D1 = 03/19/21)    For oral chemotherapy:  Pharmacy: [facility]   Medication Access: $3/month, medicaid    Interval History: I spoke with Jenna Mosley to check in on how her first 2 weeks of imatinib therapy have gone. Overall she states she feels well on it. She did have some nausea at night for the first couple of days but this has improved since then. She does support taking it with food. She notes some slight transient LE swelling and some eye puffiness in the morning but this improves quickly. She denies skin rashes, diarrhea, constipation, new onset pain or fatigue. The last conversation we had we discussed her recent visit regarding fertility preservation. Ultimately the recommendation is to use Lupron and this has been approved and can be given in our clinic. She had not restarted her OCP due to concerns from hepatology. She does request a referral to gyn.     No labs since 4/26.     Oncologic History:  Oncology History   [name] (chronic myelocytic leukemia) (CMS-HCC)   02/19/2021 Initial Diagnosis    [name] (chronic myelocytic leukemia) (CMS-HCC)     04/09/2021 Endocrine/Hormone Therapy    OP LEUPROLIDE (LUPRON) 11.25 MG EVERY 3 MONTHS  Plan Provider: Pernell Dupre, MD         Weight and Vitals:  Wt Readings from Last 3 Encounters:   04/02/21 (!) 150.4 kg (331 lb 9.6 oz)   03/28/21 (!) 149.2 kg (329 lb)   03/18/21 (!) 151.2 kg (333 lb 6.4 oz)     Temp Readings from Last 3 Encounters:   04/02/21 36.7 ??C (98 ??F) (Tympanic)   03/18/21 36.2 ??C (97.1 ??F) (Temporal)   03/18/21 36.6 ??C (97.9 ??F) (Temporal)     BP Readings from Last 3 Encounters:   04/02/21 106/64   03/18/21 109/65   03/18/21 115/68     Pulse Readings from Last 3 Encounters:   04/02/21 62   03/18/21 75   03/18/21 65       Pertinent Labs:  No visits with results within 1 Day(s) from this visit.   Latest known visit with results is:   Lab on 03/12/2021   Component Date Value Ref Range Status   ??? Sodium 03/12/2021 131 (A) 135 - 145 mmol/L Final   ??? Potassium 03/12/2021 4.0  3.4 - 4.8 mmol/L Final   ??? Chloride 03/12/2021 95 (A) 98 - 107 mmol/L Final   ??? Anion Gap 03/12/2021 12  5 - 14 mmol/L Final   ??? CO2 03/12/2021 24.0  20.0 - 31.0 mmol/L Final   ??? BUN 03/12/2021 11  9 - 23 mg/dL Final   ??? Creatinine 03/12/2021 0.56 (A) 0.60 - 0.80 mg/dL Final   ??? BUN/Creatinine Ratio 03/12/2021 20   Final   ??? EGFR CKD-EPI Non-African American,* 03/12/2021 >90  >=60 mL/min/1.33m2 Final   ??? EGFR CKD-EPI African American, Fem* 03/12/2021 >90  >=60 mL/min/1.70m2 Final   ??? Glucose 03/12/2021 406 (A) 70 - 179 mg/dL Final   ??? Calcium 51/88/4166 9.8  8.7 - 10.4 mg/dL Final   ??? Albumin 05/16/1600 3.7  3.4 - 5.0 g/dL Final   ??? Total Protein 03/12/2021 7.2  5.7 - 8.2 g/dL Final   ??? Total Bilirubin 03/12/2021 0.3  0.3 - 1.2 mg/dL  Final   ??? AST 03/12/2021 25  <=34 U/L Final   ??? ALT 03/12/2021 12  10 - 49 U/L Final   ??? Alkaline Phosphatase 03/12/2021 196 (A) 46 - 116 U/L Final   ??? WBC 03/12/2021 47.0 (A) 3.6 - 11.2 10*9/L Final   ??? RBC 03/12/2021 4.21  3.95 - 5.13 10*12/L Final   ??? HGB 03/12/2021 12.0  11.3 - 14.9 g/dL Final   ??? HCT 16/08/9603 37.5  34.0 - 44.0 % Final   ??? MCV 03/12/2021 88.9  77.6 - 95.7 fL Final   ??? MCH 03/12/2021 28.5  25.9 - 32.4 pg Final   ??? MCHC 03/12/2021 32.1  32.0 - 36.0 g/dL Final   ??? RDW 54/07/8118 15.1  12.2 - 15.2 % Final   ??? MPV 03/12/2021 8.6  6.8 - 10.7 fL Final   ??? Platelet 03/12/2021 555 (A) 150 - 450 10*9/L Final   ??? Neutrophils % 03/12/2021 79  % Final   ??? Lymphocytes % 03/12/2021 6  % Final   ??? Monocytes % 03/12/2021 3  % Final   ??? Eosinophils % 03/12/2021 6  % Final   ??? Basophils % 03/12/2021 6  % Final   ??? Absolute Neutrophils 03/12/2021 37.1 (A) 1.8 - 7.8 10*9/L Final   ??? Absolute Lymphocytes 03/12/2021 2.8  1.1 - 3.6 10*9/L Final   ??? Absolute Monocytes 03/12/2021 1.4 (A) 0.3 - 0.8 10*9/L Final   ??? Absolute Eosinophils 03/12/2021 2.8 (A) 0.0 - 0.5 10*9/L Final   ??? Absolute Basophils 03/12/2021 2.8 (A) 0.0 - 0.1 10*9/L Final   ??? Smear Review Comments 03/12/2021 See Comment (A) Undefined Final    Slide Reviewed. Myelocytes present.       Allergies:   Allergies   Allergen Reactions   ??? Lisinopril Shortness Of Breath   ??? Naproxen Nausea Only, Palpitations and Other (See Comments)     Chest palpitations and feels like flying   ??? Aripiprazole Other (See Comments)     Inability to urinate     ??? Fluphenazine      mental health problems   ??? Lactase      Other reaction(s): Unknown   ??? Metoclopramide      Mania   ??? Prednisone Other (See Comments)     mania   ??? Reglan [Metoclopramide Hcl] Other (See Comments)     Induces mania   ??? Diphenhydramine Hcl Anxiety   ??? Multihance [Gadobenate Dimeglumine] Nausea And Vomiting   ??? Ondansetron Hcl Anxiety   ??? Promethazine Anxiety       Drug Interactions: none; imatinib is a moderate 3A4 inh and is also a substrate   - Monitor QTc on ziprasidone plus TKIs      Current Medications:  Current Outpatient Medications   Medication Sig Dispense Refill   ??? albuterol HFA 90 mcg/actuation inhaler Inhale 2 puffs Take as directed. Take puff before exercise and you can take an additional puff while you are exercising if you feel short of breath 18 g 2   ??? ALLERGY RELIEF, LORATADINE, 10 mg tablet TAKE 1 Tablet BY MOUTH ONCE DAILY 90 tablet 1   ??? atenoloL (TENORMIN) 100 MG tablet TAKE 1 Tablet BY MOUTH ONCE DAILY 90 tablet 2   ??? blood sugar diagnostic (ACCU-CHEK GUIDE TEST STRIPS) Strp Check sugars before meals and before bedtime.  E11.9 100 each 11   ??? blood-glucose meter kit Use as instructed - pt prefers a larger monitor glucometer is available 1 each 0   ???  cephalexin (KEFLEX) 500 MG capsule Take 1 capsule (500 mg total) by mouth Three (3) times a day for 10 days. 30 capsule 0   ??? cholecalciferol, vitamin D3, (VITAMIN D3 ORAL) Take by mouth daily.  (Patient not taking: Reported on 04/02/2021)     ??? clonazePAM (KLONOPIN) 0.5 MG tablet Take 1 mg by mouth daily as needed for anxiety.      ??? divalproex (DEPAKOTE) 250 MG DR tablet Take 250 mg by mouth every evening.   1   ??? ferrous sulfate 325 (65 FE) MG tablet Take 1 tablet by mouth daily.     ??? flash glucose sensor (FLASH GLUCOSE SENSOR) kit by Other route every fourteen (14) days. 1 each 11   ??? fluconazole (DIFLUCAN) 150 MG tablet Take 1 and repeat in 5 days if needed. 2 tablet 0   ??? furosemide (LASIX) 40 MG tablet Take 1 tablet (40 mg total) by mouth every other day. 30 tablet 5   ??? hydroCHLOROthiazide (HYDRODIURIL) 25 MG tablet TAKE ONE TABLET BY MOUTH EVERY DAY 30 tablet 5   ??? imatinib (GLEEVEC) 400 MG tablet Take 1 tablet (400 mg total) by mouth daily. 30 tablet 11   ??? insulin detemir U-100 (LEVEMIR FLEXTOUCH U-100 INSULN) 100 unit/mL (3 mL) injection pen Inject 0.2 mL (20 Units total) under the skin daily. Increase by 2 units nightly until your fasting blood sugar is less than 150. (Patient taking differently: Inject 80 Units under the skin daily. Increase by 2 units nightly until your fasting blood sugar is less than 150.) 15 mL 12   ??? insulin lispro (HUMALOG KWIKPEN INSULIN) 100 unit/mL injection pen Check blood sugars before meals and bedtime.  Take sliding scale insulin as directed: for blood sugars < 150 = no insulin; blood sugars 151-200 = take 2 units; blood sugars 201-250 = 4 units; blood sugars 251-300 = 6 units; blood sugars 301-350 = 8 units; blood sugars 351-400 = take 10 units; blood sugars > 400 = take 12 units and recheck blood sugar in 1 hour and reapply sliding scale insulin (max dose insulin = 36 units).  If blood sugars remain elevated > 400 after taking max dose of insulin, go to nearest ER to be evaluated. 3 mL 12   ??? lamoTRIgine (LAMICTAL) 150 MG tablet Take 150 mg by mouth Two (2) times a day.     ??? lancets (ACCU-CHEK SOFTCLIX LANCETS) Misc Check sugar once daily and prn as directed E11.65 100 each 11   ??? pantoprazole (PROTONIX) 40 MG tablet TAKE ONE TABLET BY MOUTH EVERY DAY. 90 tablet 1   ??? pen needle, diabetic (NOVOFINE 32) 32 gauge x 1/4 (6 mm) Ndle 1 each by Miscellaneous route daily. Use with Victoza 100 each 11   ??? pregabalin (LYRICA) 75 MG capsule TAKE 1 CAPSULE BY MOUTH EVERY MORNING and 2 EVERY EVENING 90 capsule 0   ??? traZODone (DESYREL) 100 MG tablet Take 200 mg by mouth nightly.      ??? valsartan (DIOVAN) 160 MG tablet TAKE ONE TABLET BY MOUTH EVERY DAY 30 tablet 5   ??? ziprasidone (GEODON) 80 MG capsule Take 80 mg by mouth 2 (two) times a day with meals.        No current facility-administered medications for this visit.       Adherence: Missed 1 dose early on because she forgot. She reiterated her understanding of the importance of adherence      Assessment: Jenna Mosley is a 30 y.o. female with  [name] being treated currently with imatinib 400 mg daily (D1 = 5/3)    Plan:   - Continue imatinib 400 mg daily. Take with food.   - We will proceed with Lupron, and can provide the first dose when she RTC on 5/24. Continue every 3 months. Since this will be longterm use, we should consider addition of calcium, vitD, and/or progesterone (micronor?) addition to provide bone health support. Appreciate recommendations.   - Consider involving [facility] if not already involved  - Patient requests gyn referral to [facility]  - RTC on 5/24. No CPP follow-up scheduled at this time, happy to schedule/see her as needed.      F/u:  Future Appointments   Date Time Provider Department Center   04/03/2021  8:00 AM Bartholomew Boards, PhD [facility]   04/09/2021  3:00 PM ADULT ONC LAB [facility]   04/09/2021  4:00 PM Sean Marrian Salvage, AGNP [id number] [facility]   05/14/2021  1:00 PM Nurum Joeseph Amor, MD [facility]   06/18/2021  3:20 PM Vanessa [place], FNP JMAKENFP [facility]   06/20/2021  3:30 PM Tarri Abernethy, FNP [facility]   09/23/2021  3:20 PM Vikki Desmond Lope, ANP [facility]         Manfred Arch, PharmD, BCOP, CPP  Pager: [phone]        The patient reports they are currently: at home. I spent 10 minutes on the phone with the patient on the date of service. I spent an additional 10 minutes on pre- and post-visit activities on the date of service.     The patient was physically located in [place] or a state in which I am permitted to provide care. The patient and/or parent/guardian understood that s/he may incur co-pays and cost sharing, and agreed to the telemedicine visit. The visit was reasonable and appropriate under the circumstances given the patient's presentation at the time.    The patient and/or parent/guardian has been advised of the potential risks and limitations of this mode of treatment (including, but not limited to, the absence of in-person examination) and has agreed to be treated using telemedicine. The patient's/patient's family's questions regarding telemedicine have been answered.     If the visit was completed in an ambulatory setting, the patient and/or parent/guardian has also been advised to contact their provider???s office for worsening conditions, and seek emergency medical treatment and/or call 911 if the patient deems either necessary. completed in an ambulatory setting, the patient and/or parent/guardian has also been advised to contact their provider???s office for worsening conditions, and seek emergency medical treatment and/or call 911 if the patient deems either necessary.
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
## 2021-04-03 ENCOUNTER — Telehealth: Admit: 2021-04-03 | Discharge: 2021-04-04 | Payer: MEDICAID | Attending: Clinical | Primary: Clinical
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
## 2021-04-03 NOTE — Unmapped (Addendum)
Hi Jenna Mosley to meet you yesterday.  Look forward to working with you to lose weight so you can decrease your achy pain in joints, have more energy, feel better and get stronger, so you can pursue more fun life activities you talked about yesterday.     We will take baby steps. Every healthy behavior choice (water, veggies, a walk, trying something new and healthy) is a puzzle piece. When fit together and you persist over time, it adds up to weight loss and feeling better.     I have given your name to the GI Schedulers. They will call you to set up virtual sessions for 6 months, about every 3 weeks apart.     Look forward to seeing you in June!    Cecil Cranker. Sander Radon, PhD  Clinical Health Psychologist  Professor of Medicine  [email or web address]  [phone]  Liver Program: [phone]

## 2021-04-04 NOTE — Unmapped (Unsigned)
From Katie:    I talked to her this week and she's doing well so far on imatinib! A couple things:     - We're game-on for Lupron. It's approved and can be given in clinic when she sees [place] on 5/24. Please just let the RN know. No OCP due to hepatology concerns, so lupron will also act as adequate birth control.   - With longterm Lupron, we probably should consider bone health support (Ca, VitD, maybe adding progesterone?)   - Is [facility] involved? Maybe we can make referral to them as they sometimes can make recs related to these things   - When she is seen 5/24 can we place referral for OB/GYN? Patient wants to establish with [facility] for needs going forward and I think for our purposes would be nice to have her internal if we're ever pausing therapy for pregnancy, etc.       How is she tolerating.  Should she     Is AMH level drawn

## 2021-04-09 NOTE — Unmapped (Signed)
Hi NP Dellis Anes,    Patient Jenna Mosley contacted the Communication Center to cancel their appointment for today.  The appointment has been cancelled. Appointment has been rescheduled to 04/23/21.    Cancellation Reason: Scheduling conflict    Thank you,  Yolanda Bonine  [facility]   [phone]

## 2021-04-10 ENCOUNTER — Ambulatory Visit: Admit: 2021-04-10 | Discharge: 2021-04-11 | Payer: MEDICAID

## 2021-04-10 DIAGNOSIS — E1165 Type 2 diabetes mellitus with hyperglycemia: Principal | ICD-10-CM

## 2021-04-10 DIAGNOSIS — R197 Diarrhea, unspecified: Principal | ICD-10-CM

## 2021-04-10 DIAGNOSIS — R110 Nausea: Principal | ICD-10-CM

## 2021-04-10 LAB — COMPREHENSIVE METABOLIC PANEL
ALBUMIN: 3.8 g/dL (ref 3.4–5.0)
ALKALINE PHOSPHATASE: 217 U/L — ABNORMAL HIGH (ref 46–116)
ALT (SGPT): <7 U/L — ABNORMAL LOW (ref 10–49)
ANION GAP: 9 mmol/L (ref 5–14)
AST (SGOT): 14 U/L (ref <=34)
BILIRUBIN TOTAL: 0.7 mg/dL (ref 0.3–1.2)
BLOOD UREA NITROGEN: 10 mg/dL (ref 9–23)
BUN / CREAT RATIO: 14
CALCIUM: 9.5 mg/dL (ref 8.7–10.4)
CHLORIDE: 100 mmol/L (ref 98–107)
CO2: 27 mmol/L (ref 20.0–31.0)
CREATININE: 0.72 mg/dL
EGFR CKD-EPI (2021) FEMALE: >90 mL/min/{1.73_m2} (ref >=60)
GLUCOSE RANDOM: 233 mg/dL — ABNORMAL HIGH (ref 70–179)
POTASSIUM: 3.6 mmol/L (ref 3.4–4.8)
PROTEIN TOTAL: 7.3 g/dL (ref 5.7–8.2)
SODIUM: 136 mmol/L (ref 135–145)

## 2021-04-10 LAB — SLIDE REVIEW

## 2021-04-10 LAB — CBC W/ AUTO DIFF
BASOPHILS ABSOLUTE COUNT: 0.3 10*9/L — ABNORMAL HIGH (ref 0.0–0.1)
BASOPHILS RELATIVE PERCENT: 2.1 %
EOSINOPHILS ABSOLUTE COUNT: 0.2 10*9/L (ref 0.0–0.5)
EOSINOPHILS RELATIVE PERCENT: 1.3 %
HEMATOCRIT: 38.2 % (ref 34.0–44.0)
HEMOGLOBIN: 12.4 g/dL (ref 11.3–14.9)
LYMPHOCYTES ABSOLUTE COUNT: 1.9 10*9/L (ref 1.1–3.6)
LYMPHOCYTES RELATIVE PERCENT: 14.9 %
MEAN CORPUSCULAR HEMOGLOBIN CONC: 32.6 g/dL (ref 32.0–36.0)
MEAN CORPUSCULAR HEMOGLOBIN: 28.4 pg (ref 25.9–32.4)
MEAN CORPUSCULAR VOLUME: 87.2 fL (ref 77.6–95.7)
MEAN PLATELET VOLUME: 9.2 fL (ref 6.8–10.7)
MONOCYTES ABSOLUTE COUNT: 1 10*9/L — ABNORMAL HIGH (ref 0.3–0.8)
MONOCYTES RELATIVE PERCENT: 7.5 %
NEUTROPHILS ABSOLUTE COUNT: 9.7 10*9/L — ABNORMAL HIGH (ref 1.8–7.8)
NEUTROPHILS RELATIVE PERCENT: 74.2 %
PLATELET COUNT: 401 10*9/L (ref 150–450)
RED BLOOD CELL COUNT: 4.38 10*12/L (ref 3.95–5.13)
RED CELL DISTRIBUTION WIDTH: 15.6 % — ABNORMAL HIGH (ref 12.2–15.2)
WBC ADJUSTED: 13 10*9/L — ABNORMAL HIGH (ref 3.6–11.2)

## 2021-04-10 MED ORDER — LEVEMIR FLEXTOUCH U-100 INSULIN 100 UNIT/ML (3 ML) SUBCUTANEOUS PEN
Freq: Every day | SUBCUTANEOUS | 0 refills | 30 days | Status: CP
Start: 2021-04-10 — End: 2021-04-10
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
## 2021-04-10 NOTE — Unmapped (Signed)
[facility] Shared [facility] Clinical Assessment & Refill Coordination Note    Jenna Mosley, DOB: 02/01/1991  Phone: [phone] (home)     All above HIPAA information was verified with patient.     Was a Nurse, learning disability used for this call? No    Specialty Medication(s):   Hematology/Oncology: Imatinib     Current Outpatient Medications   Medication Sig Dispense Refill   ??? albuterol HFA 90 mcg/actuation inhaler Inhale 2 puffs Take as directed. Take puff before exercise and you can take an additional puff while you are exercising if you feel short of breath 18 g 2   ??? ALLERGY RELIEF, LORATADINE, 10 mg tablet TAKE 1 Tablet BY MOUTH ONCE DAILY 90 tablet 1   ??? atenoloL (TENORMIN) 100 MG tablet TAKE 1 Tablet BY MOUTH ONCE DAILY 90 tablet 2   ??? blood sugar diagnostic (ACCU-CHEK GUIDE TEST STRIPS) Strp Check sugars before meals and before bedtime.  E11.9 100 each 11   ??? blood-glucose meter kit Use as instructed - pt prefers a larger monitor glucometer is available 1 each 0   ??? cholecalciferol, vitamin D3, (VITAMIN D3 ORAL) Take by mouth daily.  (Patient not taking: Reported on 04/02/2021)     ??? clonazePAM (KLONOPIN) 0.5 MG tablet Take 1 mg by mouth daily as needed for anxiety.      ??? divalproex (DEPAKOTE) 250 MG DR tablet Take 250 mg by mouth every evening.   1   ??? ferrous sulfate 325 (65 FE) MG tablet Take 1 tablet by mouth daily.     ??? flash glucose sensor (FLASH GLUCOSE SENSOR) kit by Other route every fourteen (14) days. 1 each 11   ??? furosemide (LASIX) 40 MG tablet Take 1 tablet (40 mg total) by mouth every other day. 30 tablet 5   ??? hydroCHLOROthiazide (HYDRODIURIL) 25 MG tablet TAKE ONE TABLET BY MOUTH EVERY DAY 30 tablet 5   ??? imatinib (GLEEVEC) 400 MG tablet Take 1 tablet (400 mg total) by mouth daily. 30 tablet 11   ??? insulin detemir U-100 (LEVEMIR FLEXTOUCH U-100 INSULN) 100 unit/mL (3 mL) injection pen Inject 0.8 mL (80 Units total) under the skin in the morning. Increase by 2 units nightly until your fasting blood sugar is less than 150.. 24 mL 0   ??? insulin lispro (HUMALOG KWIKPEN INSULIN) 100 unit/mL injection pen Check blood sugars before meals and bedtime.  Take sliding scale insulin as directed: for blood sugars < 150 = no insulin; blood sugars 151-200 = take 2 units; blood sugars 201-250 = 4 units; blood sugars 251-300 = 6 units; blood sugars 301-350 = 8 units; blood sugars 351-400 = take 10 units; blood sugars > 400 = take 12 units and recheck blood sugar in 1 hour and reapply sliding scale insulin (max dose insulin = 36 units).  If blood sugars remain elevated > 400 after taking max dose of insulin, go to nearest ER to be evaluated. 3 mL 12   ??? lamoTRIgine (LAMICTAL) 150 MG tablet Take 150 mg by mouth Two (2) times a day.     ??? lancets (ACCU-CHEK SOFTCLIX LANCETS) Misc Check sugar once daily and prn as directed E11.65 100 each 11   ??? pantoprazole (PROTONIX) 40 MG tablet TAKE ONE TABLET BY MOUTH EVERY DAY. 90 tablet 1   ??? pen needle, diabetic (NOVOFINE 32) 32 gauge x 1/4 (6 mm) Ndle 1 each by Miscellaneous route daily. Use with Victoza 100 each 11   ??? pregabalin (LYRICA)  75 MG capsule TAKE 1 CAPSULE BY MOUTH EVERY MORNING and 2 EVERY EVENING 90 capsule 0   ??? traZODone (DESYREL) 100 MG tablet Take 200 mg by mouth nightly.      ??? valsartan (DIOVAN) 160 MG tablet TAKE ONE TABLET BY MOUTH EVERY DAY 30 tablet 5   ??? ziprasidone (GEODON) 80 MG capsule Take 80 mg by mouth 2 (two) times a day with meals.        No current facility-administered medications for this visit.        Changes to medications: Jenna Mosley reports no changes at this time.    Allergies   Allergen Reactions   ??? Lisinopril Shortness Of Breath   ??? Naproxen Nausea Only, Palpitations and Other (See Comments)     Chest palpitations and feels like flying   ??? Aripiprazole Other (See Comments)     Inability to urinate     ??? Fluphenazine      mental health problems   ??? Lactase      Other reaction(s): Unknown   ??? Metoclopramide      Mania   ??? Prednisone Other (See Comments)     mania   ??? Reglan [Metoclopramide Hcl] Other (See Comments)     Induces mania   ??? Diphenhydramine Hcl Anxiety   ??? Multihance [Gadobenate Dimeglumine] Nausea And Vomiting   ??? Ondansetron Hcl Anxiety   ??? Promethazine Anxiety       Changes to allergies: No    SPECIALTY MEDICATION ADHERENCE     imatinib 400 mg: unsure how many days of medicine on hand - maybe about 10-12    Medication Adherence    Patient reported X missed doses in the last month: 0  Specialty Medication: imatinib 400 mg  Patient is on additional specialty medications: No  Informant: patient  Confirmed plan for next specialty medication refill: delivery by pharmacy  Refills needed for supportive medications: not needed          Specialty medication(s) dose(s) confirmed: Regimen is correct and unchanged.     Are there any concerns with adherence? No - Jenna Mosley reported missing 1-2 doses due to severe nausea.      Adherence counseling provided? She can't take anti-nausea medication and has been using ginger but getting little relief.  She was advised to try and take imatinib after food if possible.  Important to take as prescribed.  She has been tested for COVID - pending results.  She reports severe nausea, fevers up to 101.  She has been to see a doctor and will go back if fevers worsen    CLINICAL MANAGEMENT AND INTERVENTION      Clinical Benefit Assessment:    Do you feel the medicine is effective or helping your condition? Yes    Clinical Benefit counseling provided? Not needed    Adverse Effects Assessment:    Are you experiencing any side effects? No    Are you experiencing difficulty administering your medicine? No    Quality of Life Assessment:    How many days over the past month did your [name]  keep you from your normal activities? For example, brushing your teeth or getting up in the morning. 0    Have you discussed this with your provider? Not needed    Acute Infection Status:    Acute infections noted within Epic:  Rule Out COVID-19.  Patient reported infection: She is running a fever up to 101 and has been checked out by a provider.  Has been tested for COVID - results pending.  She was advised to go to ED if fevers/symptoms worsen- pharmacy reported to provider    Therapy Appropriateness:    Is therapy appropriate? Yes, therapy is appropriate and should be continued    DISEASE/MEDICATION-SPECIFIC INFORMATION      N/A    PATIENT SPECIFIC NEEDS     - Does the patient have any physical, cognitive, or cultural barriers? No    - Is the patient high risk? Yes, patient is taking oral chemotherapy. Appropriateness of therapy as been assessed    - Does the patient require a Care Management Plan? No     - Does the patient require physician intervention or other additional services (i.e. nutrition, smoking cessation, social work)? No      SHIPPING     Specialty Medication(s) to be Shipped:   Hematology/Oncology: Imatinib    Other medication(s) to be shipped: No additional medications requested for fill at this time     Changes to insurance: No    Delivery Scheduled: Yes, Expected medication delivery date: 04/12/21.     Medication will be delivered via Next Day Courier to the confirmed prescription address in [facility].    The patient will receive a drug information handout for each medication shipped and additional FDA Medication Guides as required.  Verified that patient has previously received a Conservation officer, historic buildings and a Surveyor, mining.    The patient or caregiver noted above participated in the development of this care plan and knows that they can request review of or adjustments to the care plan at any time.      All of the patient's questions and concerns have been addressed.    Breck Coons Shared [facility] Specialty Pharmacist

## 2021-04-10 NOTE — Unmapped (Signed)
Called pt for rooming, busy signal on phone

## 2021-04-10 NOTE — Unmapped (Addendum)
COVID testing pending, please quarantine until testing results return. Blood work done for further evaluation    Do not take tylenol for now, if temperature goes to 100 or above, go to the emergency department  Hydration, your urine should be clear to pale yellow in color    Please let your hematologist know of current symptoms. If worsening symptoms, please go to the emergency department for further evaluation.

## 2021-04-11 MED FILL — IMATINIB 400 MG TABLET: ORAL | 30 days supply | Qty: 30 | Fill #1

## 2021-04-11 NOTE — Unmapped (Signed)
I called Jenna Mosley to ask how she felt to day. She reports that her fever stopped and she has not had a fever or symptoms since last night. She is now feeling a lot better.   COVID and Flu tests were negative.    She was advised to keep drinking and resting.

## 2021-04-11 NOTE — Unmapped (Signed)
Addended by: Linward Headland V on: 04/10/2021 07:24 PM     Modules accepted: Orders

## 2021-04-16 DIAGNOSIS — C9210 Chronic myeloid leukemia, BCR/ABL-positive, not having achieved remission: Principal | ICD-10-CM
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
## 2021-04-21 DIAGNOSIS — C9210 Chronic myeloid leukemia, BCR/ABL-positive, not having achieved remission: Principal | ICD-10-CM

## 2021-04-23 ENCOUNTER — Other Ambulatory Visit: Admit: 2021-04-23 | Discharge: 2021-04-23 | Payer: MEDICAID

## 2021-04-23 ENCOUNTER — Ambulatory Visit: Admit: 2021-04-23 | Discharge: 2021-04-23 | Payer: MEDICAID | Attending: Adult Health | Primary: Adult Health

## 2021-04-23 DIAGNOSIS — C9210 Chronic myeloid leukemia, BCR/ABL-positive, not having achieved remission: Principal | ICD-10-CM

## 2021-04-23 LAB — CBC W/ AUTO DIFF
BASOPHILS ABSOLUTE COUNT: 0.1 10*9/L (ref 0.0–0.1)
BASOPHILS RELATIVE PERCENT: 1.6 %
EOSINOPHILS ABSOLUTE COUNT: 0.3 10*9/L (ref 0.0–0.5)
EOSINOPHILS RELATIVE PERCENT: 4.3 %
HEMATOCRIT: 32.2 % — ABNORMAL LOW (ref 34.0–44.0)
HEMOGLOBIN: 10.4 g/dL — ABNORMAL LOW (ref 11.3–14.9)
LYMPHOCYTES ABSOLUTE COUNT: 2.6 10*9/L (ref 1.1–3.6)
LYMPHOCYTES RELATIVE PERCENT: 34.1 %
MEAN CORPUSCULAR HEMOGLOBIN CONC: 32.5 g/dL (ref 32.0–36.0)
MEAN CORPUSCULAR HEMOGLOBIN: 28.2 pg (ref 25.9–32.4)
MEAN CORPUSCULAR VOLUME: 86.9 fL (ref 77.6–95.7)
MEAN PLATELET VOLUME: 9 fL (ref 6.8–10.7)
MONOCYTES ABSOLUTE COUNT: 0.5 10*9/L (ref 0.3–0.8)
MONOCYTES RELATIVE PERCENT: 6.8 %
NEUTROPHILS ABSOLUTE COUNT: 4 10*9/L (ref 1.8–7.8)
NEUTROPHILS RELATIVE PERCENT: 53.2 %
PLATELET COUNT: 412 10*9/L (ref 150–450)
RED BLOOD CELL COUNT: 3.7 10*12/L — ABNORMAL LOW (ref 3.95–5.13)
RED CELL DISTRIBUTION WIDTH: 15.2 % (ref 12.2–15.2)
WBC ADJUSTED: 7.6 10*9/L (ref 3.6–11.2)

## 2021-04-23 LAB — COMPREHENSIVE METABOLIC PANEL
ALBUMIN: 3.4 g/dL (ref 3.4–5.0)
ALKALINE PHOSPHATASE: 249 U/L — ABNORMAL HIGH (ref 46–116)
ALT (SGPT): 15 U/L (ref 10–49)
ANION GAP: 7 mmol/L (ref 5–14)
AST (SGOT): 21 U/L (ref <=34)
BILIRUBIN TOTAL: 0.3 mg/dL (ref 0.3–1.2)
BLOOD UREA NITROGEN: 11 mg/dL (ref 9–23)
BUN / CREAT RATIO: 15
CALCIUM: 9.8 mg/dL (ref 8.7–10.4)
CHLORIDE: 105 mmol/L (ref 98–107)
CO2: 24 mmol/L (ref 20.0–31.0)
CREATININE: 0.73 mg/dL
EGFR CKD-EPI (2021) FEMALE: >90 mL/min/{1.73_m2} (ref >=60)
GLUCOSE RANDOM: 232 mg/dL — ABNORMAL HIGH (ref 70–179)
POTASSIUM: 4.2 mmol/L (ref 3.4–4.8)
PROTEIN TOTAL: 6.8 g/dL (ref 5.7–8.2)
SODIUM: 136 mmol/L (ref 135–145)

## 2021-04-23 MED ADMIN — leuprolide (LUPRON) injection 11.25 mg: 11.25 mg | INTRAMUSCULAR | @ 20:00:00 | Stop: 2021-04-23

## 2021-04-23 NOTE — Unmapped (Addendum)
I am glad to see that you are doing well with the imatinib. Continue to work on taking it every day, without missing doses.    Let us know if your feet become more swollen.    Your WBC is now in the normal range. The hemoglobin is a little low, but this may be a temporary effect from the imatinib. I would like you to get labs at [facility] in a couple of weeks. You will have a telephone call with Kendal Hymen, pharmacist, a couple of days later.    We have placed referrals to our AYA group and GYN group to help support you in fertility preservation. We will plan to continue the Lupron injections every 3 months.    Results for orders placed or performed in visit on 04/23/21   Comprehensive Metabolic Panel   Result Value Ref Range    Sodium 136 135 - 145 mmol/L    Potassium 4.2 3.4 - 4.8 mmol/L    Chloride 105 98 - 107 mmol/L    CO2 24.0 20.0 - 31.0 mmol/L    Anion Gap 7 5 - 14 mmol/L    BUN 11 9 - 23 mg/dL    Creatinine 1.61 0.96 - 0.80 mg/dL    BUN/Creatinine Ratio 15     eGFR CKD-EPI (2021) Female >90 >=60 mL/min/1.34m2    Glucose 232 (H) 70 - 179 mg/dL    Calcium 9.8 8.7 - 04.5 mg/dL    Albumin 3.4 3.4 - 5.0 g/dL    Total Protein 6.8 5.7 - 8.2 g/dL    Total Bilirubin 0.3 0.3 - 1.2 mg/dL    AST 21 <=40 U/L    ALT 15 10 - 49 U/L    Alkaline Phosphatase 249 (H) 46 - 116 U/L   CBC w/ Differential   Result Value Ref Range    WBC 7.6 3.6 - 11.2 10*9/L    RBC 3.70 (L) 3.95 - 5.13 10*12/L    HGB 10.4 (L) 11.3 - 14.9 g/dL    HCT 98.1 (L) 19.1 - 44.0 %    MCV 86.9 77.6 - 95.7 fL    MCH 28.2 25.9 - 32.4 pg    MCHC 32.5 32.0 - 36.0 g/dL    RDW 47.8 29.5 - 62.1 %    MPV 9.0 6.8 - 10.7 fL    Platelet 412 150 - 450 10*9/L    Neutrophils % 53.2 %    Lymphocytes % 34.1 %    Monocytes % 6.8 %    Eosinophils % 4.3 %    Basophils % 1.6 %    Absolute Neutrophils 4.0 1.8 - 7.8 10*9/L    Absolute Lymphocytes 2.6 1.1 - 3.6 10*9/L    Absolute Monocytes 0.5 0.3 - 0.8 10*9/L    Absolute Eosinophils 0.3 0.0 - 0.5 10*9/L    Absolute Basophils 0.1 0.0 - 0.1 10*9/L

## 2021-04-23 NOTE — Unmapped (Signed)
Administered Lupron 11.25mg   to right upper ventral gluteal. Gauze and bandaid applied. Patient tolerated well.  Initial injection. Patient to wait in room for 15 minutes to monitor for any adverse reactions.

## 2021-04-23 NOTE — Unmapped (Signed)
1639 Lab drawn peripherally and sent for analysis. Dressing applied after hemostasis.

## 2021-04-24 MED ORDER — INSULIN DETEMIR (U-100) 100 UNIT/ML (3 ML) SUBCUTANEOUS PEN
Freq: Two times a day (BID) | SUBCUTANEOUS | 0 refills | 30 days | Status: CP
Start: 2021-04-24 — End: 2022-04-24

## 2021-04-24 NOTE — Unmapped (Signed)
I called Ms. Nordstrom in response to her mychart to call her. She requested that she needed a prescription refill for her Levemir to bridge her to her upcoming new PCP visit at the end of the month (she has been unable to get a call back from her old PCP). She had questions regarding if she should increase her levemir beyond 80 units BID, which her providers have told her to do but without clear instructions since the pen max's out at 80 units. I told her at this time since I am not managing her diabetes, I'm not going to suggest increasing her doses until she gets in with her new PCP (and endocrine in August of this year). However I did refill levemir for 30 day supply to her local pharmacy. She confirmed that she is also using her Humalog pen 6+ units before each meal and at bed time according to her sliding scale. Her DM appears to be uncontrolled based off random glucoses of 233 seen in clinic and use of her sliding scale insulin.     Additionally, she asked for [place] to arrange follow-up. I told her Gregary Signs has requested those visits at their appointment yesterday. Labs in 2 weeks at [facility] and phone visit with me following. She'd be due for BCR-ABL 3 months after starting therapy (D1 = 5/3, so 3 months is early August).    We also discussed that the [facility] was a test to check egg count before she starts lupron. This number is likely something requested by her fertility team. It will likely be several years before she'd be eligible to be considered for TKI discontinuation depending on how she responds. Following with AYA - we need to add progesterone or aygestin for bone health due to longterm bone health risks with lupron. Hepatology had requested we avoid OCPs in the past due to elevated LFTs but assuming that is the estrogen component, we may be ok using progesterone or aygestin here. Appreciate AYA's recommendations for this and other bone health related recs (Ca/VitD also reasonable).      Manfred Arch, PharmD, BCOP, CPP  Pager: [phone]

## 2021-04-25 LAB — ANTIMULLERIAN HORMONE (AMH): AMH (MULLERIAN): 3 ng/mL

## 2021-04-25 NOTE — Unmapped (Signed)
Appt.'s have been moved to 8/10.

## 2021-05-02 NOTE — Unmapped (Signed)
[facility] Refill Coordination Note    Specialty Medication(s) to be Shipped:   Hematology/Oncology: Imatinib    Other medication(s) to be shipped: No additional medications requested for fill at this time     Jenna Mosley, DOB: [birth year]  Phone: [phone] (home)       All above HIPAA information was verified with patient.     Was a Nurse, learning disability used for this call? No    Completed refill call assessment today to schedule patient's medication shipment from the [facility] [phone]).  All relevant notes have been reviewed.     Specialty medication(s) and dose(s) confirmed: Regimen is correct and unchanged.   Changes to medications: [name] reports starting the following medications: Hydroxyzine  Changes to insurance: No  New side effects reported not previously addressed with a pharmacist or physician: None reported  Questions for the pharmacist: No    Confirmed patient received a Conservation officer, historic buildings and a Surveyor, mining with first shipment. The patient will receive a drug information handout for each medication shipped and additional FDA Medication Guides as required.       DISEASE/MEDICATION-SPECIFIC INFORMATION        N/A    SPECIALTY MEDICATION ADHERENCE     Medication Adherence    Patient reported X missed doses in the last month: 1  Specialty Medication: imatinib 400 mg  Patient is on additional specialty medications: No              Were doses missed due to medication being on hold? No    imatinib 400 mg: 14 days of medicine on hand       REFERRAL TO PHARMACIST     Referral to the pharmacist: Not needed      [facility]     Shipping address confirmed in Epic.     Delivery Scheduled: Yes, Expected medication delivery date: 05/15/21.     Medication will be delivered via UPS to the prescription address in Epic [place].    Jenna Mosley Jenna Mosley   [facility] Specialty Technician

## 2021-05-03 DIAGNOSIS — L608 Other nail disorders: Principal | ICD-10-CM

## 2021-05-03 DIAGNOSIS — E1165 Type 2 diabetes mellitus with hyperglycemia: Principal | ICD-10-CM

## 2021-05-03 DIAGNOSIS — Z794 Long term (current) use of insulin: Principal | ICD-10-CM

## 2021-05-03 NOTE — Unmapped (Signed)
Received the following mychart message with attached picture from patient:    Can you please give me a referral to [facility] Podiatrist at [facility] in [place]? It is part of [facility].   I have a large lump on my toe and the nail has turned black. I think it is a fungus.    ______________________________________________________________________________________________    Assessment/plan:  Toenail deformity; h/o IDDM / referred to [facility] Podiatry at [facility] in [place] per patient request.

## 2021-05-03 NOTE — Unmapped (Signed)
Referral was placed 

## 2021-05-06 ENCOUNTER — Ambulatory Visit: Admit: 2021-05-06 | Discharge: 2021-05-07 | Payer: MEDICAID

## 2021-05-06 DIAGNOSIS — R2242 Localized swelling, mass and lump, left lower limb: Principal | ICD-10-CM

## 2021-05-06 LAB — COMPREHENSIVE METABOLIC PANEL
ALBUMIN: 3.7 g/dL (ref 3.4–5.0)
ALKALINE PHOSPHATASE: 201 U/L — ABNORMAL HIGH (ref 46–116)
ALT (SGPT): 18 U/L (ref 10–49)
ANION GAP: 8 mmol/L (ref 5–14)
AST (SGOT): 29 U/L (ref <=34)
BILIRUBIN TOTAL: 0.4 mg/dL (ref 0.3–1.2)
BLOOD UREA NITROGEN: 10 mg/dL (ref 9–23)
BUN / CREAT RATIO: 16
CALCIUM: 9.3 mg/dL (ref 8.7–10.4)
CHLORIDE: 100 mmol/L (ref 98–107)
CO2: 29.1 mmol/L (ref 20.0–31.0)
CREATININE: 0.61 mg/dL
EGFR CKD-EPI (2021) FEMALE: >90 mL/min/{1.73_m2} (ref >=60)
GLUCOSE RANDOM: 272 mg/dL — ABNORMAL HIGH (ref 70–179)
POTASSIUM: 4 mmol/L (ref 3.4–4.8)
PROTEIN TOTAL: 7.1 g/dL (ref 5.7–8.2)
SODIUM: 137 mmol/L (ref 135–145)

## 2021-05-06 LAB — CBC W/ AUTO DIFF
BASOPHILS ABSOLUTE COUNT: 0.1 10*9/L (ref 0.0–0.1)
BASOPHILS RELATIVE PERCENT: 1.1 %
EOSINOPHILS ABSOLUTE COUNT: 0.3 10*9/L (ref 0.0–0.5)
EOSINOPHILS RELATIVE PERCENT: 5 %
HEMATOCRIT: 33.9 % — ABNORMAL LOW (ref 34.0–44.0)
HEMOGLOBIN: 11.1 g/dL — ABNORMAL LOW (ref 11.3–14.9)
LYMPHOCYTES ABSOLUTE COUNT: 2.7 10*9/L (ref 1.1–3.6)
LYMPHOCYTES RELATIVE PERCENT: 39.4 %
MEAN CORPUSCULAR HEMOGLOBIN CONC: 32.8 g/dL (ref 32.0–36.0)
MEAN CORPUSCULAR HEMOGLOBIN: 28 pg (ref 25.9–32.4)
MEAN CORPUSCULAR VOLUME: 85.3 fL (ref 77.6–95.7)
MEAN PLATELET VOLUME: 8.4 fL (ref 6.8–10.7)
MONOCYTES ABSOLUTE COUNT: 0.5 10*9/L (ref 0.3–0.8)
MONOCYTES RELATIVE PERCENT: 7.8 %
NEUTROPHILS ABSOLUTE COUNT: 3.2 10*9/L (ref 1.8–7.8)
NEUTROPHILS RELATIVE PERCENT: 46.7 %
NUCLEATED RED BLOOD CELLS: 0 /100{WBCs} (ref <=4)
PLATELET COUNT: 399 10*9/L (ref 150–450)
RED BLOOD CELL COUNT: 3.97 10*12/L (ref 3.95–5.13)
RED CELL DISTRIBUTION WIDTH: 15 % (ref 12.2–15.2)
WBC ADJUSTED: 6.8 10*9/L (ref 3.6–11.2)
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
## 2021-05-07 ENCOUNTER — Institutional Professional Consult (permissible substitution): Admit: 2021-05-07 | Discharge: 2021-05-08 | Payer: MEDICAID | Attending: Pharmacist | Primary: Pharmacist

## 2021-05-07 MED ORDER — CALCIUM CARBONATE 600 MG-VITAMIN D3 10 MCG (400 UNIT) TABLET
ORAL_TABLET | Freq: Two times a day (BID) | ORAL | 11 refills | 30 days | Status: CP
Start: 2021-05-07 — End: 2022-05-07

## 2021-05-07 MED ORDER — NORETHINDRONE ACETATE 5 MG TABLET
ORAL_TABLET | Freq: Every day | ORAL | 11 refills | 30 days | Status: CP
Start: 2021-05-07 — End: ?

## 2021-05-07 NOTE — Unmapped (Signed)
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 Overall she states she feels well. She takes it with food every morning but notes missing some doses. She received a call from the pharmacy recently stating that she should have 15 pills left, but she had 20 pills. She did not recall missing these doses. We talked about setting a reminder alarm on her phone every morning and she felt that this was a great idea and will try it moving forward. She notes some intermittent muscle cramps on her fingers/joints that last a day and relieved with OTC tylenol. She's had issues with swollen feet but reports that this has gone down significantly since her last clinic visit in 6/7. Her swelling comes and goes, not painful or red. She takes lasix every other day as prescribed and keeps her feet elevated. She denies skin rashes, nausea, diarrhea, constipation, or fatigue. Her first Lupron injection was on 6/7 and started a period shortly after. She is aware to contact her providers if this re-occurs.    On labs from 6/20, Hgb 11.1, PLT 399, ANC 3.2, CMP WNL but notable for high glucose (272) - currently on insulin and managed by PCP     Oncologic History:  Oncology History   [name] (chronic myelocytic leukemia) (CMS-HCC)   02/19/2021 Initial Diagnosis    [name] (chronic myelocytic leukemia) (CMS-HCC)     04/23/2021 Endocrine/Hormone Therapy    OP LEUPROLIDE (LUPRON) 11.25 MG EVERY 3 MONTHS  Plan Provider: Pernell Dupre, MD         Weight and Vitals:  Wt Readings from Last 3 Encounters:   04/23/21 (!) 154.4 kg (340 lb 8 oz)   04/10/21 (!) 147.4 kg (325 lb)   04/02/21 (!) 150.4 kg (331 lb 9.6 oz) Temp Readings from Last 3 Encounters:   04/23/21 36.6 ??C (97.9 ??F) (Temporal)   04/10/21 36.1 ??C (97 ??F)   04/02/21 36.7 ??C (98 ??F) (Tympanic)     BP Readings from Last 3 Encounters:   04/23/21 111/61   04/10/21 109/71   04/02/21 106/64     Pulse Readings from Last 3 Encounters:   04/23/21 59   04/10/21 75   04/02/21 62       Pertinent Labs:  No visits with results within 1 Day(s) from this visit.   Latest known visit with results is:   Appointment on 05/06/2021   Component Date Value Ref Range Status   ??? Sodium 05/06/2021 137  135 - 145 mmol/L Final   ??? Potassium 05/06/2021 4.0  3.4 - 4.8 mmol/L Final   ??? Chloride 05/06/2021 100  98 - 107 mmol/L Final   ??? CO2 05/06/2021 29.1  20.0 - 31.0 mmol/L Final   ??? Anion Gap 05/06/2021 8  5 - 14 mmol/L Final   ??? BUN 05/06/2021 10  9 - 23 mg/dL Final   ??? Creatinine 05/06/2021 0.61  0.60 - 0.80 mg/dL Final   ??? BUN/Creatinine Ratio 05/06/2021 16   Final   ??? eGFR CKD-EPI (2021) Female 05/06/2021 >90  >=60 mL/min/1.50m2 Final    eGFR calculated with CKD-EPI 2021 equation  in accordance with [employer] and [employer] of Nephrology Task Force recommendations.   ??? Glucose 05/06/2021 272 (A) 70 - 179 mg/dL Final   ??? Calcium 16/08/9603 9.3  8.7 - 10.4 mg/dL Final   ??? Albumin 54/07/8118 3.7  3.4 - 5.0 g/dL Final   ??? Total Protein 05/06/2021 7.1  5.7 - 8.2 g/dL Final   ??? Total Bilirubin 05/06/2021 0.4  0.3 - 1.2 mg/dL Final   ??? AST 14/78/2956 29  <=34 U/L Final   ??? ALT 05/06/2021 18  10 - 49 U/L Final   ??? Alkaline Phosphatase 05/06/2021 201 (A) 46 - 116 U/L Final   ??? WBC 05/06/2021 6.8  3.6 - 11.2 10*9/L Final   ??? RBC 05/06/2021 3.97  3.95 - 5.13 10*12/L Final   ??? HGB 05/06/2021 11.1 (A) 11.3 - 14.9 g/dL Final   ??? HCT 21/30/8657 33.9 (A) 34.0 - 44.0 % Final   ??? MCV 05/06/2021 85.3  77.6 - 95.7 fL Final   ??? MCH 05/06/2021 28.0  25.9 - 32.4 pg Final   ??? MCHC 05/06/2021 32.8  32.0 - 36.0 g/dL Final   ??? RDW 84/69/6295 15.0  12.2 - 15.2 % Final   ??? MPV 05/06/2021 8.4 6.8 - 10.7 fL Final   ??? Platelet 05/06/2021 399  150 - 450 10*9/L Final   ??? nRBC 05/06/2021 0  <=4 /100 WBCs Final   ??? Neutrophils % 05/06/2021 46.7  % Final   ??? Lymphocytes % 05/06/2021 39.4  % Final   ??? Monocytes % 05/06/2021 7.8  % Final   ??? Eosinophils % 05/06/2021 5.0  % Final   ??? Basophils % 05/06/2021 1.1  % Final   ??? Absolute Neutrophils 05/06/2021 3.2  1.8 - 7.8 10*9/L Final   ??? Absolute Lymphocytes 05/06/2021 2.7  1.1 - 3.6 10*9/L Final   ??? Absolute Monocytes 05/06/2021 0.5  0.3 - 0.8 10*9/L Final   ??? Absolute Eosinophils 05/06/2021 0.3  0.0 - 0.5 10*9/L Final   ??? Absolute Basophils 05/06/2021 0.1  0.0 - 0.1 10*9/L Final       Allergies:   Allergies   Allergen Reactions   ??? Lisinopril Shortness Of Breath   ??? Naproxen Nausea Only, Palpitations and Other (See Comments)     Chest palpitations and feels like flying   ??? Aripiprazole Other (See Comments)     Inability to urinate     ??? Fluphenazine      mental health problems   ??? Lactase      Other reaction(s): Unknown   ??? Metoclopramide      Mania   ??? Prednisone Other (See Comments)     mania   ??? Reglan [Metoclopramide Hcl] Other (See Comments)     Induces mania   ??? Diphenhydramine Hcl Anxiety   ??? Multihance [Gadobenate Dimeglumine] Nausea And Vomiting   ??? Ondansetron Hcl Anxiety   ??? Promethazine Anxiety       Drug Interactions:  none; imatinib is a moderate 3A4 inh and is also a substrate   - Monitor QTc on ziprasidone plus TKIs      Current Medications:  Current Outpatient Medications   Medication Sig Dispense Refill   ??? albuterol HFA 90 mcg/actuation inhaler Inhale 2 puffs Take as directed. Take puff before exercise and you can take an additional puff while you are exercising if you feel short of breath 18 g 2   ??? ALLERGY RELIEF, LORATADINE, 10 mg tablet TAKE 1 Tablet BY MOUTH  ONCE DAILY 90 tablet 1   ??? atenoloL (TENORMIN) 100 MG tablet TAKE 1 Tablet BY MOUTH ONCE DAILY 90 tablet 2   ??? blood sugar diagnostic (ACCU-CHEK GUIDE TEST STRIPS) Strp Check sugars before meals and before bedtime.  E11.9 100 each 11   ??? blood-glucose meter kit Use as instructed - pt prefers a larger monitor glucometer is available 1 each 0   ??? cholecalciferol, vitamin D3, (VITAMIN D3 ORAL) Take by mouth daily.     ??? clonazePAM (KLONOPIN) 0.5 MG tablet Take 1 mg by mouth daily as needed for anxiety.      ??? divalproex (DEPAKOTE) 250 MG DR tablet Take 250 mg by mouth every evening.   1   ??? ferrous sulfate 325 (65 FE) MG tablet Take 1 tablet by mouth daily.     ??? flash glucose sensor (FLASH GLUCOSE SENSOR) kit by Other route every fourteen (14) days. 1 each 11   ??? furosemide (LASIX) 40 MG tablet Take 1 tablet (40 mg total) by mouth every other day. 30 tablet 5   ??? hydroCHLOROthiazide (HYDRODIURIL) 25 MG tablet TAKE ONE TABLET BY MOUTH EVERY DAY 30 tablet 5   ??? imatinib (GLEEVEC) tablet Take 1 tablet (400 mg total) by mouth daily. 30 tablet 11   ??? insulin detemir U-100 (LEVEMIR) 100 unit/mL (3 mL) injection pen Inject 0.8 mL (80 Units total) under the skin Two (2) times a day. 48 mL 0   ??? insulin lispro (HUMALOG KWIKPEN INSULIN) 100 unit/mL injection pen Check blood sugars before meals and bedtime.  Take sliding scale insulin as directed: for blood sugars < 150 = no insulin; blood sugars 151-200 = take 2 units; blood sugars 201-250 = 4 units; blood sugars 251-300 = 6 units; blood sugars 301-350 = 8 units; blood sugars 351-400 = take 10 units; blood sugars > 400 = take 12 units and recheck blood sugar in 1 hour and reapply sliding scale insulin (max dose insulin = 36 units).  If blood sugars remain elevated > 400 after taking max dose of insulin, go to nearest ER to be evaluated. 3 mL 12   ??? lamoTRIgine (LAMICTAL) 150 MG tablet Take 150 mg by mouth Two (2) times a day.     ??? lancets (ACCU-CHEK SOFTCLIX LANCETS) Misc Check sugar once daily and prn as directed E11.65 100 each 11   ??? pantoprazole (PROTONIX) 40 MG tablet TAKE ONE TABLET BY MOUTH EVERY DAY. 90 tablet 1   ??? pen needle, diabetic (NOVOFINE 32) 32 gauge x 1/4 (6 mm) Ndle 1 each by Miscellaneous route daily. Use with Victoza 100 each 11   ??? pregabalin (LYRICA) 75 MG capsule TAKE 1 CAPSULE BY MOUTH EVERY MORNING and 2 EVERY EVENING 90 capsule 0   ??? traZODone (DESYREL) 100 MG tablet Take 200 mg by mouth nightly.      ??? valsartan (DIOVAN) 160 MG tablet TAKE ONE TABLET BY MOUTH EVERY DAY 30 tablet 5   ??? ziprasidone (GEODON) 80 MG capsule Take 80 mg by mouth 2 (two) times a day with meals.        No current facility-administered medications for this visit.       Adherence:   Missed ~5 doses (should have 15 pills but she had 20 pills left over). She reiterated her understanding of the importance of adherence and will start putting a reminder on her phone every morning      Assessment: Jenna Mosley is a 30 y.o. 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 Given planned long-term lupron, patient should be on progresterone and calcium-Vit supplementation for bone health. Hepatology had requested we avoid OCPs in the past due to elevated LFTs but assuming that is the estrogen component, we may be ok using progesterone or aygestin here. LFTs on 6/20 WNL.     Plan:   - Continue imatinib 400 mg daily. Take with food. Instructed patient to set reminder alarms on her phone  - Continue Lupron every 3 months.   - Start norethindrone 5 mg daily for bone health. Rx-sent.  - Start calcium-vit D 600 mg-400 units twice daily for bone health. Rx sent but patient can also take OTC - She prefers Viactiv chews if insurance will not cover Rx  - RTC on 8/9. No CPP follow-up scheduled at this time, happy to schedule/see her as needed for tolerability and adherence.      F/u:  Future Appointments   Date Time Provider Department Center   05/14/2021  1:00 PM Nurum Joeseph Amor, MD [facility]   05/21/2021 10:30 AM Ancil Linsey, NP [facility]   05/29/2021  1:00 PM Bartholomew Boards, PhD [facility]   06/18/2021  3:20 PM Vanessa [place], FNP [facility]   06/25/2021  3:00 PM ADULT ONC LAB [facility]   06/25/2021  4:00 PM Sean Marrian Salvage, AGNP [id number] [facility]   07/03/2021  1:00 PM Bartholomew Boards, PhD [facility]   07/15/2021  3:30 PM Tarri Abernethy, FNP [facility]   09/23/2021  3:20 PM Vikki Desmond Lope, ANP [facility]       Jacqueline Dela Pe??a, PharmD  PGY-2 Oncology Pharmacy Resident      Manfred Arch, PharmD, BCOP, CPP  Pager: [phone]        The patient reports they are currently: at home. I spent 20 minutes on the phone with the patient on the date of service. I spent an additional 10 minutes on pre- and post-visit activities on the date of service.     The patient was physically located in [place] or a state in which I am permitted to provide care. The patient and/or parent/guardian understood that s/he may incur co-pays and cost sharing, and agreed to the telemedicine visit. The visit was reasonable and appropriate under the circumstances given the patient's presentation at the time.    The patient and/or parent/guardian has been advised of the potential risks and limitations of this mode of treatment (including, but not limited to, the absence of in-person examination) and has agreed to be treated using telemedicine. The patient's/patient's family's questions regarding telemedicine have been answered.     If the visit was completed in an ambulatory setting, the patient and/or parent/guardian has also been advised to contact their provider???s office for worsening conditions, and seek emergency medical treatment and/or call 911 if the patient deems either necessary.

## 2021-05-08 LAB — ANTIMULLERIAN HORMONE (AMH): AMH (MULLERIAN): 3.7 ng/mL

## 2021-05-10 DIAGNOSIS — E119 Type 2 diabetes mellitus without complications: Principal | ICD-10-CM

## 2021-05-10 MED ORDER — PEN NEEDLE, DIABETIC 32 GAUGE X 1/4" (6 MM)
Freq: Every day | 11 refills | 0 days | Status: CP
Start: 2021-05-10 — End: ?
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
## 2021-05-14 ENCOUNTER — Ambulatory Visit
Admit: 2021-05-14 | Discharge: 2021-05-15 | Payer: MEDICAID | Attending: Geriatric Medicine | Primary: Geriatric Medicine

## 2021-05-14 DIAGNOSIS — I10 Essential (primary) hypertension: Principal | ICD-10-CM

## 2021-05-14 DIAGNOSIS — E785 Hyperlipidemia, unspecified: Principal | ICD-10-CM

## 2021-05-14 DIAGNOSIS — R0602 Shortness of breath: Principal | ICD-10-CM

## 2021-05-14 DIAGNOSIS — Z6841 Body Mass Index (BMI) 40.0 and over, adult: Principal | ICD-10-CM

## 2021-05-14 DIAGNOSIS — E1142 Type 2 diabetes mellitus with diabetic polyneuropathy: Principal | ICD-10-CM

## 2021-05-14 DIAGNOSIS — J984 Other disorders of lung: Principal | ICD-10-CM

## 2021-05-14 LAB — COMPREHENSIVE METABOLIC PANEL
ALBUMIN: 3.9 g/dL (ref 3.4–5.0)
ALKALINE PHOSPHATASE: 230 U/L — ABNORMAL HIGH (ref 46–116)
ALT (SGPT): 25 U/L (ref 10–49)
ANION GAP: 9 mmol/L (ref 5–14)
AST (SGOT): 28 U/L (ref <=34)
BILIRUBIN TOTAL: 0.3 mg/dL (ref 0.3–1.2)
BLOOD UREA NITROGEN: 15 mg/dL (ref 9–23)
BUN / CREAT RATIO: 19
CALCIUM: 9.9 mg/dL (ref 8.7–10.4)
CHLORIDE: 98 mmol/L (ref 98–107)
CO2: 25 mmol/L (ref 20.0–31.0)
CREATININE: 0.8 mg/dL
EGFR CKD-EPI (2021) FEMALE: >90 mL/min/{1.73_m2} (ref >=60)
GLUCOSE RANDOM: 391 mg/dL — ABNORMAL HIGH (ref 70–179)
POTASSIUM: 4.2 mmol/L (ref 3.4–4.8)
PROTEIN TOTAL: 7.2 g/dL (ref 5.7–8.2)
SODIUM: 132 mmol/L — ABNORMAL LOW (ref 135–145)

## 2021-05-14 LAB — LIPID PANEL
CHOLESTEROL/HDL RATIO SCREEN: 4.9 — ABNORMAL HIGH (ref 1.0–4.5)
CHOLESTEROL: 207 mg/dL — ABNORMAL HIGH (ref <=200)
HDL CHOLESTEROL: 42 mg/dL (ref 40–60)
LDL CHOLESTEROL CALCULATED: 101 mg/dL — ABNORMAL HIGH (ref 40–99)
NON-HDL CHOLESTEROL: 165 mg/dL — ABNORMAL HIGH (ref 70–130)
TRIGLYCERIDES: 322 mg/dL — ABNORMAL HIGH (ref 0–150)
VLDL CHOLESTEROL CAL: 64.4 mg/dL — ABNORMAL HIGH (ref 8–32)

## 2021-05-14 LAB — HEMOGLOBIN A1C
ESTIMATED AVERAGE GLUCOSE: 220 mg/dL
HEMOGLOBIN A1C: 9.3 % — ABNORMAL HIGH (ref 4.8–5.6)

## 2021-05-14 MED ORDER — METFORMIN 500 MG TABLET
ORAL_TABLET | Freq: Two times a day (BID) | ORAL | 3 refills | 90 days | Status: CP
Start: 2021-05-14 — End: 2021-06-13

## 2021-05-14 MED ORDER — CETIRIZINE 10 MG TABLET
ORAL_TABLET | Freq: Every day | ORAL | 3 refills | 90 days | Status: CP
Start: 2021-05-14 — End: 2022-05-14

## 2021-05-14 MED ORDER — FUROSEMIDE 40 MG TABLET
ORAL_TABLET | ORAL | 3 refills | 90 days | Status: CP
Start: 2021-05-14 — End: ?

## 2021-05-14 MED ORDER — HYDROCHLOROTHIAZIDE 25 MG TABLET
ORAL_TABLET | Freq: Every day | ORAL | 3 refills | 90 days | Status: CP
Start: 2021-05-14 — End: ?

## 2021-05-14 MED ORDER — ALBUTEROL SULFATE HFA 90 MCG/ACTUATION AEROSOL INHALER
RESPIRATORY_TRACT | 2 refills | 0 days | Status: CP
Start: 2021-05-14 — End: 2022-05-14

## 2021-05-14 MED ORDER — INSULIN DETEMIR (U-100) 100 UNIT/ML (3 ML) SUBCUTANEOUS PEN
Freq: Two times a day (BID) | SUBCUTANEOUS | 0 refills | 30 days | Status: CP
Start: 2021-05-14 — End: 2022-05-14

## 2021-05-14 MED ORDER — PREGABALIN 75 MG CAPSULE
ORAL_CAPSULE | 3 refills | 0 days | Status: CP
Start: 2021-05-14 — End: ?

## 2021-05-14 MED ORDER — ATENOLOL 100 MG TABLET
ORAL_TABLET | Freq: Every day | ORAL | 3 refills | 90 days | Status: CP
Start: 2021-05-14 — End: 2021-08-12

## 2021-05-14 MED ORDER — PANTOPRAZOLE 40 MG TABLET,DELAYED RELEASE
ORAL_TABLET | Freq: Every day | ORAL | 3 refills | 90 days | Status: CP
Start: 2021-05-14 — End: 2021-08-12

## 2021-05-14 MED FILL — IMATINIB 400 MG TABLET: ORAL | 30 days supply | Qty: 30 | Fill #2

## 2021-05-15 MED ORDER — ALBUTEROL SULFATE HFA 90 MCG/ACTUATION AEROSOL INHALER
Freq: Four times a day (QID) | RESPIRATORY_TRACT | 11 refills | 0.00000 days | Status: CP | PRN
Start: 2021-05-15 — End: 2022-05-15

## 2021-05-16 NOTE — Unmapped (Signed)
Continue to work on diet and exercise.  Patient has lost some weight.  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  Can continue to reconsider in the future.

## 2021-05-16 NOTE — Unmapped (Signed)
>>  ASSESSMENT AND PLAN FOR DIABETES MELLITUS WITHOUT COMPLICATION (CMS-HCC) WRITTEN ON 05/15/2021  5:54 PM BY ERDEM, Mauricio Po, MD    We will start to add back in patient's previous regimen and continue insulin at current dosage.  Start metformin at 500 once daily for 3 days followed by 500 twice daily for 3 days followed by 1000 twice daily.  Would like to add back in Victoza at some point because it will help with her weight loss.  Discuss at follow-up visit.

## 2021-05-16 NOTE — Unmapped (Signed)
Addended by: Willia Craze on: 05/16/2021 10:19 AM     Modules accepted: Level of Service

## 2021-05-16 NOTE — Unmapped (Signed)
We will start to add back in patient's previous regimen and continue insulin at current dosage.  Start metformin at 500 once daily for 3 days followed by 500 twice daily for 3 days followed by 1000 twice daily.  Would like to add back in Victoza at some point because it will help with her weight loss.  Discuss at follow-up visit.

## 2021-05-29 ENCOUNTER — Telehealth: Admit: 2021-05-29 | Discharge: 2021-05-30 | Payer: MEDICAID | Attending: Clinical | Primary: Clinical
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
## 2021-05-30 NOTE — Unmapped (Signed)
Would like a call back in 2 weeks

## 2021-06-05 ENCOUNTER — Telehealth: Admit: 2021-06-05 | Discharge: 2021-06-06 | Payer: MEDICAID | Attending: Clinical | Primary: Clinical

## 2021-06-05 NOTE — Unmapped (Signed)
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 Patient expresses guilty feeligs after eating bad foods. Provided support and validation to have some intentional treats throughout the week if feels good about eating nutritious foods most of the time. Pointed out patient strengths as enjoying veggies and fruits and willingness to try new recipes. Patient set New action plan#1 to drink more water throughout the day. She will add lemon or other fruit to water to make it more palatable.  She plans to purchase lemons/fruit tomorrow and drink half a gallon of water for 3 out of the next 7 days. Confidence = 9.(confidence for 4 days was 5). Currently baseline water consumption =0.  Expresses some germ-related anxiety about drinking tap water. Next session in 3 weeks. ??We will plan to review more nutritional information and set add'l action plans.     Jenna Mosley. Sander Radon, PhD  Clinical Health Psychologist  Professor of Medicine  Division of Gastroenterology and Hepatology

## 2021-06-06 NOTE — Unmapped (Addendum)
Jenna Mosley session yesterday. You are making a ton of healthy changes to your daily living right now - keep up the great work - it will pay off over time!! You will keep losing weight and feeling better, physically and mentally.     Here are some notes about our session today:     Great job doing the [employer] 2x a week - this strengthens your heart/lungs and your muscles over time   Keep finding other ways to be on your feet and moving. Get up and stretch and move every hour to loosen stiff muscles and joints.   Great job not bringing home high-risk pizza, cookies, and chips that are hard to eat in small portions. You are practicing environment control at home - a very effective behavioral strategy. Don't have in the house the foods that you overeat.   Keep finding recipes online for more nutritious alternatives (cauliflower rice). You can also look at www.eatingwell.com and medinsteadofmeds.com websites. Try to move away from beef and pork and more towards veggies/legume/chicken/seafood meals.  Today we discussed increasing your awareness and watching your mindset about cooking meals. Being willing and able to do some homecooking a few days a week will be really helpful with weight loss.Try to make it a positive experience --staying present, going slow, breaking up meal prep steps throughout the day, breathe and relax into it, and listening to music. See the PDF of recipes and mindful cooking I emailed you.   Some of your strengths that will help you succeed with weight loss are enjoying veggies and fruits and your willingness to try new recipes!   Your New goal is to drink more water. So your Action Plan to help you accomplish this goal is to go to store and purchase lemons/fruit and 3 gallons of water. You will add lemons or other fruits to the gallon and will drink 1/2 a gallon on 3 out of the next 7 days. Your Confidence = 9/10 to achieve this goal. Track your water intake this week and let's see if this strategy works for you. We are experimenting with works. Set a new Water Action Plan next week to stay the same or increase water intake.  Next virtual session in 3 weeks.      Cecil Cranker. Sander Radon, PhD  Clinical Health Psychologist  Professor of Medicine  [email or web address]  [phone]  [facility] Program: [phone]

## 2021-06-10 NOTE — Unmapped (Signed)
[facility] Refill Coordination Note    Specialty Medication(s) to be Shipped:   Hematology/Oncology: Imatinib    Other medication(s) to be shipped: No additional medications requested for fill at this time     Kirat Mezquita, DOB: [birth year]  Phone: [phone] (home)       All above HIPAA information was verified with patient.     Was a Nurse, learning disability used for this call? No    Completed refill call assessment today to schedule patient's medication shipment from the [facility] [phone]).  All relevant notes have been reviewed.     Specialty medication(s) and dose(s) confirmed: Regimen is correct and unchanged.   Changes to medications: Sunshyne reports no changes at this time.  Changes to insurance: No  New side effects reported not previously addressed with a pharmacist or physician: None reported  Questions for the pharmacist: No    Confirmed patient received a Conservation officer, historic buildings and a Surveyor, mining with first shipment. The patient will receive a drug information handout for each medication shipped and additional FDA Medication Guides as required.       DISEASE/MEDICATION-SPECIFIC INFORMATION        N/A    SPECIALTY MEDICATION ADHERENCE     Medication Adherence    Patient reported X missed doses in the last month: 0  Specialty Medication: imatinib 400 mg  Patient is on additional specialty medications: No  Informant: patient              Were doses missed due to medication being on hold? No    imatinib 400 mg: 12 days of medicine on hand       REFERRAL TO PHARMACIST     Referral to the pharmacist: Not needed      [facility]     Shipping address confirmed in Epic.     Delivery Scheduled: Yes, Expected medication delivery date: 06/19/21.     Medication will be delivered via Next Day Courier to the prescription address in Epic [place].    [name] [name]   [facility] Specialty Technician

## 2021-06-13 ENCOUNTER — Ambulatory Visit: Admit: 2021-06-13 | Discharge: 2021-06-14 | Disposition: A | Discharge status: Home / Self Care | Payer: MEDICAID

## 2021-06-13 DIAGNOSIS — R739 Hyperglycemia, unspecified: Principal | ICD-10-CM

## 2021-06-13 MED ADMIN — insulin lispro (HumaLOG) injection 10 Units: 10 [IU] | SUBCUTANEOUS | @ 23:00:00 | Stop: 2021-06-13

## 2021-06-13 NOTE — Unmapped (Signed)
Pt ambulatory to triage c/o elevated blood sugar today. Pt was seen at [facility] for r/o UTI, and found to have HI blood sugar on glucomter. Pt reports h/o diabetes. 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 Pt reports feeling lightheaded and nauseated as well.

## 2021-06-14 MED ADMIN — sodium chloride 0.9% (NS) bolus 1,000 mL: 1000 mL | INTRAVENOUS | @ 02:00:00 | Stop: 2021-06-13
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
## 2021-06-14 NOTE — Unmapped (Signed)
ULTRASOUND PIV PROCEDURE NOTE    Indications:   Poor venous access.    Ultrasound guidance was necessary to obtain access.     Procedure Details:  Identity of the patient was confirmed via name, medical record number and date of birth. The availability of the correct equipment was verified.    The vein was identified and measured for ultrasound catheter insertion.       Vein measurement (without tourniquet):   0.31 cm   A(n) 20 g x 1.75 inch catheter was selected based on the recommendations below:    [facility] Catheter/Vein Ratio Guidelines    Chart to determine PIV catheter size/length to use based on vein diameter and depth   Catheter Gauge Size (g)  22g 20g 18g   Catheter length (inches)  1.75 1.75 1.75   Catheter diameter measurement (mm) 0.75mm 0.3mm 1.39mm          Minimum required vein diameter       Sonosite (cm)  0.25cm 0.3cm 0.4cm          Maximum vein depth  1.25 cm 1.25 cm 1.25 cm            The field was prepared with necessary supplies and equipment.  Probe cover and sterile gel utilized. Insertion site was prepped with chlorhexidine and allowed to dry.  The catheter extension was primed with normal saline. The [place] PIV was placed in the right forearm with 1 attempt(s).     Catheter aspirated, 5 mL blood return present. The catheter was then flushed with 10 mL of normal saline. Insertion site cleansed, and dressing applied per manufacturer guidelines. The catheter was inserted without difficulty.    Thank you,     Alyssa Grove DNP, RN    Ultrasound Resource Nurse    Workup / Procedure Time:  15 minutes

## 2021-06-18 MED FILL — IMATINIB 400 MG TABLET: ORAL | 30 days supply | Qty: 30 | Fill #3

## 2021-06-20 DIAGNOSIS — C9210 Chronic myeloid leukemia, BCR/ABL-positive, not having achieved remission: Principal | ICD-10-CM

## 2021-06-25 ENCOUNTER — Other Ambulatory Visit: Admit: 2021-06-25 | Discharge: 2021-06-25 | Payer: MEDICAID

## 2021-06-25 ENCOUNTER — Ambulatory Visit: Admit: 2021-06-25 | Discharge: 2021-06-25 | Payer: MEDICAID | Attending: Adult Health | Primary: Adult Health

## 2021-06-25 DIAGNOSIS — C9210 Chronic myeloid leukemia, BCR/ABL-positive, not having achieved remission: Principal | ICD-10-CM

## 2021-06-25 LAB — CBC W/ AUTO DIFF
BASOPHILS ABSOLUTE COUNT: 0.2 10*9/L — ABNORMAL HIGH (ref 0.0–0.1)
BASOPHILS RELATIVE PERCENT: 3 %
EOSINOPHILS ABSOLUTE COUNT: 0.2 10*9/L (ref 0.0–0.5)
EOSINOPHILS RELATIVE PERCENT: 3.2 %
HEMATOCRIT: 37.5 % (ref 34.0–44.0)
HEMOGLOBIN: 12.2 g/dL (ref 11.3–14.9)
LYMPHOCYTES ABSOLUTE COUNT: 2.8 10*9/L (ref 1.1–3.6)
LYMPHOCYTES RELATIVE PERCENT: 37.6 %
MEAN CORPUSCULAR HEMOGLOBIN CONC: 32.4 g/dL (ref 32.0–36.0)
MEAN CORPUSCULAR HEMOGLOBIN: 26.8 pg (ref 25.9–32.4)
MEAN CORPUSCULAR VOLUME: 82.5 fL (ref 77.6–95.7)
MEAN PLATELET VOLUME: 9.7 fL (ref 6.8–10.7)
MONOCYTES ABSOLUTE COUNT: 0.3 10*9/L (ref 0.3–0.8)
MONOCYTES RELATIVE PERCENT: 3.7 %
NEUTROPHILS ABSOLUTE COUNT: 3.9 10*9/L (ref 1.8–7.8)
NEUTROPHILS RELATIVE PERCENT: 52.5 %
PLATELET COUNT: 391 10*9/L (ref 150–450)
RED BLOOD CELL COUNT: 4.55 10*12/L (ref 3.95–5.13)
RED CELL DISTRIBUTION WIDTH: 15.7 % — ABNORMAL HIGH (ref 12.2–15.2)
WBC ADJUSTED: 7.4 10*9/L (ref 3.6–11.2)

## 2021-06-25 LAB — COMPREHENSIVE METABOLIC PANEL
ALBUMIN: 3.9 g/dL (ref 3.4–5.0)
ALKALINE PHOSPHATASE: 161 U/L — ABNORMAL HIGH (ref 46–116)
ALT (SGPT): 14 U/L (ref 10–49)
ANION GAP: 8 mmol/L (ref 5–14)
AST (SGOT): 19 U/L (ref <=34)
BILIRUBIN TOTAL: 0.4 mg/dL (ref 0.3–1.2)
BLOOD UREA NITROGEN: 14 mg/dL (ref 9–23)
BUN / CREAT RATIO: 18
CALCIUM: 9.6 mg/dL (ref 8.7–10.4)
CHLORIDE: 101 mmol/L (ref 98–107)
CO2: 22.8 mmol/L (ref 20.0–31.0)
CREATININE: 0.79 mg/dL
EGFR CKD-EPI (2021) FEMALE: >90 mL/min/{1.73_m2} (ref >=60)
GLUCOSE RANDOM: 439 mg/dL (ref 70–179)
POTASSIUM: 4.4 mmol/L (ref 3.4–4.8)
PROTEIN TOTAL: 7.1 g/dL (ref 5.7–8.2)
SODIUM: 132 mmol/L — ABNORMAL LOW (ref 135–145)

## 2021-06-25 NOTE — Unmapped (Signed)
I called and notified [name] of the elevated glucose level. She feels well, asymptomatic. She has a long history of elevated blood glucose levels and reports that her PCP has stopped her short-acting insulin while she awaits a visit with endocrinology at the end of the month. I encouraged her to discuss the elevated level with her PCP to try to keep levels safe until that appointment.    Jenna Mosley, AGPCNP-BC  Nurse Practitioner  Hematology/Oncology  [facility]
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
## 2021-06-26 ENCOUNTER — Ambulatory Visit
Admit: 2021-06-26 | Discharge: 2021-06-27 | Payer: MEDICAID | Attending: Geriatric Medicine | Primary: Geriatric Medicine

## 2021-06-26 DIAGNOSIS — Z794 Long term (current) use of insulin: Principal | ICD-10-CM

## 2021-06-26 DIAGNOSIS — I10 Essential (primary) hypertension: Principal | ICD-10-CM

## 2021-06-26 DIAGNOSIS — G4733 Obstructive sleep apnea (adult) (pediatric): Principal | ICD-10-CM

## 2021-06-26 DIAGNOSIS — E1165 Type 2 diabetes mellitus with hyperglycemia: Principal | ICD-10-CM

## 2021-06-26 DIAGNOSIS — F315 Bipolar disorder, current episode depressed, severe, with psychotic features: Principal | ICD-10-CM

## 2021-06-26 DIAGNOSIS — E119 Type 2 diabetes mellitus without complications: Principal | ICD-10-CM

## 2021-06-26 MED ORDER — METFORMIN 500 MG TABLET
ORAL_TABLET | Freq: Two times a day (BID) | ORAL | 3 refills | 45 days | Status: CP
Start: 2021-06-26 — End: 2021-06-26

## 2021-06-26 MED ORDER — METFORMIN 1,000 MG TABLET
ORAL_TABLET | Freq: Two times a day (BID) | ORAL | 3 refills | 90 days | Status: CP
Start: 2021-06-26 — End: 2021-07-26

## 2021-06-26 MED ORDER — INSULIN LISPRO (U-100) 100 UNIT/ML SUBCUTANEOUS PEN
12 refills | 0 days | Status: CP
Start: 2021-06-26 — End: ?

## 2021-06-26 MED ORDER — INSULIN DETEMIR (U-100) 100 UNIT/ML (3 ML) SUBCUTANEOUS PEN
Freq: Two times a day (BID) | SUBCUTANEOUS | 0 refills | 27.00000 days | Status: CP
Start: 2021-06-26 — End: 2022-06-26

## 2021-06-26 MED ORDER — ACCU-CHEK GUIDE TEST STRIPS
11 refills | 0 days | Status: CP
Start: 2021-06-26 — End: ?

## 2021-06-26 MED ORDER — LANCETS
11 refills | 0 days | Status: CP
Start: 2021-06-26 — End: ?

## 2021-06-26 NOTE — Unmapped (Signed)
Clarified regimen with patient.  She needs to be taking your sliding scale insulin lispro.  Recommend she check prior to every meal and take sliding scale.  At this point hold off checking 2-hour postprandial.  Regimen was so complicated in the past that patient avoided checking blood sugars and therefore did not take sliding scale insulin.  Continue Levemir but increase to 90 units twice daily.  Increase metformin to 1000 twice daily.  Patient would probably benefit from the addition of Januvia however need to get blood sugars a bit better controlled before starting.  Patient does have an appoint with endocrine coming up in a couple weeks.  Await further recommendations.

## 2021-06-26 NOTE — Unmapped (Signed)
Patient ID: Jenna Mosley is a 30 y.o. female who presents for follow up of Diabetes.  Informant: Patient is accompanied by mom.  Assessment/Plan:     Problem List Items Addressed This Visit        Cardiovascular and Mediastinum    Essential (primary) hypertension     Stable on current regimen.  No adjustments needed today.  Electrolytes stable when last checked in the ER.              Other    Morbid obesity (CMS-HCC)     Weight overall has improved however still remains high with a BMI of 45.             Obstructive sleep apnea     Confirm use of CPAP at follow-up visit           Bipolar I disorder: With psychotic features, Current or most recent episode depressed, with mixed features (CMS-HCC)     Ongoing depression as evidenced by PHQ-9.  Patient sees psychiatry and therapist.  We will follow-up with them for further recommendations on treatment.           Type 2 diabetes mellitus without complication, with long-term current use of insulin (CMS-HCC)     Clarified regimen with patient.  She needs to be taking your sliding scale insulin lispro.  Recommend she check prior to every meal and take sliding scale.  At this point hold off checking 2-hour postprandial.  Regimen was so complicated in the past that patient avoided checking blood sugars and therefore did not take sliding scale insulin.  Continue Levemir but increase to 90 units twice daily.  Increase metformin to 1000 twice daily.  Patient would probably benefit from the addition of Januvia however need to get blood sugars a bit better controlled before starting.  Patient does have an appoint with endocrine coming up in a couple weeks.  Await further recommendations.           Relevant Medications    insulin lispro (HUMALOG KWIKPEN INSULIN) 100 unit/mL injection pen    metFORMIN (GLUCOPHAGE) 1000 MG tablet    insulin detemir U-100 (LEVEMIR) 100 unit/mL (3 mL) injection pen    Type 2 diabetes mellitus with hyperglycemia, with long-term current use of insulin (CMS-HCC)    Relevant Medications    insulin lispro (HUMALOG KWIKPEN INSULIN) 100 unit/mL injection pen    blood sugar diagnostic (ACCU-CHEK GUIDE TEST STRIPS) Strp    metFORMIN (GLUCOPHAGE) 1000 MG tablet    insulin detemir U-100 (LEVEMIR) 100 unit/mL (3 mL) injection pen      Other Visit Diagnoses     Uncontrolled type 2 diabetes mellitus with hyperglycemia (CMS-HCC)        Relevant Medications    insulin lispro (HUMALOG KWIKPEN INSULIN) 100 unit/mL injection pen    metFORMIN (GLUCOPHAGE) 1000 MG tablet    insulin detemir U-100 (LEVEMIR) 100 unit/mL (3 mL) injection pen    Diabetes mellitus without complication (CMS-HCC)        Relevant Medications    insulin lispro (HUMALOG KWIKPEN INSULIN) 100 unit/mL injection pen    lancets (ACCU-CHEK SOFTCLIX LANCETS) Misc    metFORMIN (GLUCOPHAGE) 1000 MG tablet    insulin detemir U-100 (LEVEMIR) 100 unit/mL (3 mL) injection pen      I personally spent 30 minutes face-to-face and non-face-to-face in the care of this patient, which includes all pre, intra, and post visit time on the date of service.   Preventive services addressed  today  We did not review preventive services today  -- Patient verbalized an understanding of today's assessment and recommendations, as well as the purpose of ongoing medications.  Return in about 4 weeks (around 07/24/2021).     Subjective:   HPI  Patient with blood sugars running 300s to 400s.  Per my last note was supposed to continue with insulin and add in metformin with titration of 1000 twice daily.  Patient ended up stopping sliding scale with Humalog (lispro) but continued on insulin Levemir.  Metformin titration was stopped at 500 twice daily.  Patient has been feeling tired with polyuria and polydipsia.  When seen in the ER patient was noted to have blood sugar over 450 however no signs of DKA.  Patient reiterates that until her [name] diagnosis her blood sugars were under very good control on Januvia metformin and glipizide.  Patient says she has been compliant with insulin Levemir 80 units twice daily.  Working on diet.    [name]: Patient currently on Gleevec with white blood cell count normalizing.    Seeing life coach through hepatology - helping with eating.      Weight fluctuating between 325 and 340 - maximum weight was 515 pounds.    ROS  A comprehensive review of systems was conducted with negative results except as noted in the HPI above.  Allergies:   Lisinopril, Naproxen, Aripiprazole, Fluphenazine, Lactase, Metoclopramide, Prednisone, Reglan [metoclopramide hcl], Diphenhydramine hcl, Multihance [gadobenate dimeglumine], Ondansetron hcl, and Promethazine  Health Maintenance:     Health Maintenance   Topic Date Due   ??? Retinal Eye Exam  01/05/2020   ??? COVID-19 Vaccine (3 - Pfizer risk series) 09/14/2020   ??? Influenza Vaccine (1) 07/18/2021   ??? Foot Exam  08/02/2021   ??? Urine Albumin/Creatinine Ratio  08/02/2021   ??? Hemoglobin A1c  08/14/2021   ??? Serum Creatinine Monitoring  06/25/2022   ??? Potassium Monitoring  06/25/2022   ??? HPV Cotest with Pap Smear (21-65)  05/08/2025   ??? Pap Smear with Cotest HPV (21-65)  05/10/2025   ??? DTaP/Tdap/Td Vaccines (6 - Td or Tdap) 08/29/2026   ??? Pneumococcal Vaccine 0-64  Completed   ??? Hepatitis C Screen  Completed     Past Medical/Surgical History:     Patient Active Problem List   Diagnosis   ??? Esophageal reflux   ??? Essential (primary) hypertension   ??? Morbid obesity (CMS-HCC)   ??? Obstructive sleep apnea   ??? Polycystic ovarian syndrome   ??? Bipolar I disorder: With psychotic features, Current or most recent episode depressed, with mixed features (CMS-HCC)   ??? Posttraumatic stress disorder   ??? Decreased hearing of both ears   ??? Seborrheic dermatitis of scalp   ??? Pelvic mass   ??? Morbid obesity with body mass index (BMI) of 50.0 to 59.9 in adult (CMS-HCC)   ??? Diarrhea   ??? Therapeutic drug monitoring   ??? H/O non anemic vitamin B12 deficiency   ??? Schizo affective schizophrenia (CMS-HCC)   ??? Anxiety   ??? Vaginal candida   ??? BMI 45.0-49.9, adult (CMS-HCC)   ??? Urinary frequency   ??? Thyromegaly   ??? Elevated platelet count   ??? Leukocytosis   ??? Type 2 diabetes mellitus without complication, with long-term current use of insulin (CMS-HCC)   ??? [name] (chronic myelocytic leukemia) (CMS-HCC)   ??? Fatty liver   ??? Focal nodular hyperplasia of liver   ??? Type 2 diabetes mellitus with hyperglycemia, with long-term current  use of insulin (CMS-HCC)   ??? Toenail deformity   ??? Intestinal malabsorption       Objective:   Vital Signs  BP 120/52 (BP Site: L Arm, BP Position: Sitting, BP Cuff Size: Large)  - Pulse 69  - Ht 182.9 cm (6' 0.01)  - Wt (!) 150.6 kg (332 lb 1.6 oz)  - LMP  (LMP Unknown)  - SpO2 98%  - Breastfeeding No Comment: Lupron injections - BMI 45.03 kg/m??    Exam  GEN:  WDWN in NARD  NEURO:  Grossly NF exam  MOOD:  Approp, pt well dressed and groomed  PHQ-2 Score:      PHQ-9 Score: 18    Edinburgh Score:      Screening complete, depression identified / today's follow-up action documented in note

## 2021-06-26 NOTE — Unmapped (Signed)
Confirm use of CPAP at follow-up visit

## 2021-06-26 NOTE — Unmapped (Signed)
Stable on current regimen.  No adjustments needed today.  Electrolytes stable when last checked in the ER.

## 2021-06-26 NOTE — Unmapped (Signed)
Ongoing depression as evidenced by PHQ-9.  Patient sees psychiatry and therapist.  We will follow-up with them for further recommendations on treatment.

## 2021-06-26 NOTE — Unmapped (Signed)
Weight overall has improved however still remains high with a BMI of 45.

## 2021-06-30 DIAGNOSIS — C9210 Chronic myeloid leukemia, BCR/ABL-positive, not having achieved remission: Principal | ICD-10-CM

## 2021-07-01 DIAGNOSIS — C9210 Chronic myeloid leukemia, BCR/ABL-positive, not having achieved remission: Principal | ICD-10-CM

## 2021-07-01 MED ORDER — BOSUTINIB 400 MG TABLET
ORAL_TABLET | Freq: Every day | ORAL | 0 refills | 0 days | Status: CP
Start: 2021-07-01 — End: ?
  Filled 2021-07-11: qty 30, 30d supply, fill #0

## 2021-07-01 NOTE — Unmapped (Signed)
Telephone Call    I called and spoke with Ms. Ingber about her most recent BCR-ABL results. The level has not improved significantly on imatinib. After discussion with Dr. Vertell Limber and review of her other health issues, our recommendation is to switch to bosutinib. Our plan will be to work on obtaining access to the new medication and to meet her in 1-2 weeks to provide more information. I also sent her information via MyChart to review. She expressed understanding the plan.    I also spoke with Molecular Pathology. They have a sample from April from which they can run the BCR-ABL mutational analysis (tyrosine kinase), so I placed an order for them to use the specimen.    I have requested a return visit with Kendal Hymen, CPP and me in 1-2 weeks.    Langley Gauss, AGPCNP-BC  Nurse Practitioner  Hematology/Oncology  [facility]    Results for AFTAN, VINT [facility] (MRN [id number]) as of 07/01/2021 16:20   Ref. Range 02/28/2021 11:15 06/25/2021 14:45   BCR/ABL1 p210 IS% Ratio Unknown [phone]

## 2021-07-01 NOTE — Unmapped (Signed)
[facility] Specialty Medication Onboarding    Specialty Medication: Bosulif 400mg  tablets  Prior Authorization: Not Required   Financial Assistance: No - copay  <$25  Final Copay/Day Supply: $4 / 30 days    Insurance Restrictions: None     Notes to Pharmacist:     The triage team has completed the benefits investigation and has determined that the patient is able to fill this medication at [facility]. Please contact the patient to complete the onboarding or follow up with the prescribing physician as needed.

## 2021-07-03 ENCOUNTER — Telehealth: Admit: 2021-07-03 | Discharge: 2021-07-04 | Payer: MEDICAID | Attending: Clinical | Primary: Clinical

## 2021-07-03 NOTE — Unmapped (Unsigned)
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 in there and has not purchased another water bottle yet. Joetta shared other instances where she chose to drink water instead of a soft drink (when went out to eat and when over at her brother's house). Provided encouragement and reinforcement for taking the initial steps for working towards her goal and increasing her water intake from zero days out of the week to 2-3 days a week. Kierstin shared she is interested in continuing to increase her water intake since she recognizes that it is better for her body and  it has no chemicals or calories. She shared she is also motivated to increase her water intake and quality of food since she has seen the health complications her father has by not eating and drinking in a healthy way. She also shared that her her cancer treatment is not going well and wants to continue to eliminate chemicals from her diet to help improve cancer treatment. Processed and provided support around having cancer and not knowing what causes it. Collaboratively brainstormed new ways to increase water intake. As part of her action plan, Asta said she will go to a [employer] store by the weekend to purchase 24 oz bottled waters to add lemon and chill in the refrigerator/freezer to in order to take that on the go instead of a soft drink. Confidence to buy the water bottle is 10. She will also increase the amount of lemon she adds to the water or try other fruits to improve taste. Finally, she will practice ordering water at meals instead of soda to be healthier and save money. Confidence for drinking water instead of soda each day = 8. She hopes that switching to more lemon water will help reduce her cravings for soda. In terms of physical activity, she had to reduce the number of times she is attending water aerobics due to not having a car. However, she has started to go to the mall to do laps with her mother 1x week and will engage in house cleaning (mopping, swiping, wiping) almost every day. She reported she had lost some weight from mismanagement with her diabetes but her weight loss has plateaued since being prescribed insulin again. Next session in 3 weeks.??We will plan to review more nutritional information and set add'l action plans around when she is eating and types of food groups eaten during meals.     Cecil Cranker. Sander Radon, PhD  Clinical Health Psychologist  Professor of Medicine  Division of Gastroenterology and Hepatology
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
## 2021-07-08 NOTE — Unmapped (Signed)
Jenna Mosley, great job with modifying your action plan this week to meet your goal of increasing your water intake and reducing soda intake during the week. Hopefully you were able to get some water bottles this weekend, get them cold, add lemon or other fruit flavors, and take them on the road with you. I also like your idea of ordering water and not soda at restaurants to save on money, calories and to avoid extra chemicals that your body has to process. Try to track how many days you consume water and soda and how much of each. You are slowly working towards increasing the proportion of water intake relative to soda/sweetened beverage intake.    Cecil Cranker. Sander Radon, PhD  Clinical Health Psychologist  Professor of Medicine  [email or web address]  [phone]  Liver Program: [phone]

## 2021-07-09 ENCOUNTER — Ambulatory Visit: Admit: 2021-07-09 | Discharge: 2021-07-10 | Payer: MEDICAID | Attending: Adult Health | Primary: Adult Health

## 2021-07-09 ENCOUNTER — Institutional Professional Consult (permissible substitution): Admit: 2021-07-09 | Discharge: 2021-07-10 | Payer: MEDICAID

## 2021-07-09 ENCOUNTER — Other Ambulatory Visit: Admit: 2021-07-09 | Discharge: 2021-07-10 | Payer: MEDICAID

## 2021-07-09 DIAGNOSIS — C9210 Chronic myeloid leukemia, BCR/ABL-positive, not having achieved remission: Principal | ICD-10-CM

## 2021-07-09 LAB — CBC W/ AUTO DIFF
BASOPHILS ABSOLUTE COUNT: 0.2 10*9/L — ABNORMAL HIGH (ref 0.0–0.1)
BASOPHILS RELATIVE PERCENT: 2.5 %
EOSINOPHILS ABSOLUTE COUNT: 0.3 10*9/L (ref 0.0–0.5)
EOSINOPHILS RELATIVE PERCENT: 3 %
HEMATOCRIT: 38 % (ref 34.0–44.0)
HEMOGLOBIN: 12.5 g/dL (ref 11.3–14.9)
LYMPHOCYTES ABSOLUTE COUNT: 3.4 10*9/L (ref 1.1–3.6)
LYMPHOCYTES RELATIVE PERCENT: 40.2 %
MEAN CORPUSCULAR HEMOGLOBIN CONC: 32.9 g/dL (ref 32.0–36.0)
MEAN CORPUSCULAR HEMOGLOBIN: 26.9 pg (ref 25.9–32.4)
MEAN CORPUSCULAR VOLUME: 82 fL (ref 77.6–95.7)
MEAN PLATELET VOLUME: 9 fL (ref 6.8–10.7)
MONOCYTES ABSOLUTE COUNT: 0.3 10*9/L (ref 0.3–0.8)
MONOCYTES RELATIVE PERCENT: 3.8 %
NEUTROPHILS ABSOLUTE COUNT: 4.3 10*9/L (ref 1.8–7.8)
NEUTROPHILS RELATIVE PERCENT: 50.5 %
PLATELET COUNT: 315 10*9/L (ref 150–450)
RED BLOOD CELL COUNT: 4.64 10*12/L (ref 3.95–5.13)
RED CELL DISTRIBUTION WIDTH: 15.7 % — ABNORMAL HIGH (ref 12.2–15.2)
WBC ADJUSTED: 8.6 10*9/L (ref 3.6–11.2)

## 2021-07-09 LAB — COMPREHENSIVE METABOLIC PANEL
ALBUMIN: 4.1 g/dL (ref 3.4–5.0)
ALKALINE PHOSPHATASE: 181 U/L — ABNORMAL HIGH (ref 46–116)
ALT (SGPT): 14 U/L (ref 10–49)
ANION GAP: 9 mmol/L (ref 5–14)
AST (SGOT): 22 U/L (ref <=34)
BILIRUBIN TOTAL: 0.4 mg/dL (ref 0.3–1.2)
BLOOD UREA NITROGEN: 17 mg/dL (ref 9–23)
BUN / CREAT RATIO: 19
CALCIUM: 10 mg/dL (ref 8.7–10.4)
CHLORIDE: 99 mmol/L (ref 98–107)
CO2: 23 mmol/L (ref 20.0–31.0)
CREATININE: 0.9 mg/dL — ABNORMAL HIGH
EGFR CKD-EPI (2021) FEMALE: 87 mL/min/{1.73_m2} (ref >=60)
GLUCOSE RANDOM: 332 mg/dL — ABNORMAL HIGH (ref 70–179)
POTASSIUM: 4.1 mmol/L (ref 3.4–4.8)
PROTEIN TOTAL: 7.6 g/dL (ref 5.7–8.2)
SODIUM: 131 mmol/L — ABNORMAL LOW (ref 135–145)

## 2021-07-09 NOTE — Unmapped (Signed)
Our pharmacy team will call you to provide more information about bosutinib.    Please update [place] when the medication arrives so we know when you started taking it.    We are optimistic you will tolerate the medication well but please let us know if you develop any concerns.    Gregary Signs

## 2021-07-09 NOTE — Unmapped (Signed)
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 Baggett today in clinic to discuss discontinuation of imatinib and initiation of bosutinib, d/t inadequate BCR-ABL response with imatinib therapy. She endorses good tolerability of imatinib since initiation, but does report intermittent nausea (w/out emesis) from imatinib. She is unable to use prescription anti-emetics, as she states these worsen her bipolar disorder. Instead, she uses OTC ginger products (tea/chews) which help in reducing nausea symptoms. She continues to take imatinib and confirms understanding of switching to bosutinib and stopping imatinib once her bosutinib shipment arrives.      On labs, Hgb 12.5, PLT 315, ANC 4.3. CMP notable for SCr 0.9 (baseline), BG 332, on insulin tx follows with endocrine    Oncologic History:  Oncology History   [name] (chronic myelocytic leukemia) (CMS-HCC)   02/19/2021 Initial Diagnosis    [name] (chronic myelocytic leukemia) (CMS-HCC)     04/23/2021 Endocrine/Hormone Therapy    OP LEUPROLIDE (LUPRON) 11.25 MG EVERY 3 MONTHS  Plan Provider: Pernell Dupre, MD         Weight and Vitals:  Wt Readings from Last 3 Encounters:   06/26/21 (!) 150.6 kg (332 lb 1.6 oz)   06/25/21 (!) 148.8 kg (328 lb)   05/14/21 (!) 150.5 kg (331 lb 12.8 oz)     Temp Readings from Last 3 Encounters:   06/25/21 36.8 ??C (98.2 ??F) (Oral)   06/13/21 37 ??C (98.6 ??F) (Oral)   05/14/21 36.4 ??C (97.5 ??F) (Temporal)     BP Readings from Last 3 Encounters:   06/26/21 120/52   06/25/21 112/57   06/13/21 107/53     Pulse Readings from Last 3 Encounters:   06/26/21 69   06/25/21 65   06/13/21 63       Pertinent Labs:  No visits with results within 1 Day(s) from this visit.   Latest known visit with results is:   Lab on 06/25/2021   Component Date Value Ref Range Status   ??? Sodium 06/25/2021 132 (A) 135 - 145 mmol/L Final   ??? Potassium 06/25/2021 4.4  3.4 - 4.8 mmol/L Final   ??? Chloride 06/25/2021 101  98 - 107 mmol/L Final   ??? CO2 06/25/2021 22.8  20.0 - 31.0 mmol/L Final   ??? Anion Gap 06/25/2021 8  5 - 14 mmol/L Final   ??? BUN 06/25/2021 14  9 - 23 mg/dL Final   ??? Creatinine 06/25/2021 0.79  0.60 - 0.80 mg/dL Final   ??? BUN/Creatinine Ratio 06/25/2021 18   Final   ??? eGFR CKD-EPI (2021) Female 06/25/2021 >90  >=60 mL/min/1.28m2 Final    eGFR calculated with CKD-EPI 2021 equation in accordance with [employer] and [employer] of Nephrology Task Force recommendations.   ??? Glucose 06/25/2021 439 (A) 70 - 179 mg/dL Final   ??? Calcium 88/41/6606 9.6  8.7 - 10.4 mg/dL Final   ??? Albumin 30/16/0109 3.9  3.4 - 5.0 g/dL Final   ??? Total Protein 06/25/2021 7.1  5.7 - 8.2 g/dL Final   ??? Total Bilirubin 06/25/2021 0.4  0.3 - 1.2 mg/dL Final   ??? AST 32/35/5732 19  <=34  U/L Final   ??? ALT 06/25/2021 14  10 - 49 U/L Final   ??? Alkaline Phosphatase 06/25/2021 161 (A) 46 - 116 U/L Final   ??? Collection 06/25/2021 Collected   Final   ??? WBC 06/25/2021 7.4  3.6 - 11.2 10*9/L Final   ??? RBC 06/25/2021 4.55  3.95 - 5.13 10*12/L Final   ??? HGB 06/25/2021 12.2  11.3 - 14.9 g/dL Final   ??? HCT 16/08/9603 37.5  34.0 - 44.0 % Final   ??? MCV 06/25/2021 82.5  77.6 - 95.7 fL Final   ??? MCH 06/25/2021 26.8  25.9 - 32.4 pg Final   ??? MCHC 06/25/2021 32.4  32.0 - 36.0 g/dL Final   ??? RDW 54/07/8118 15.7 (A) 12.2 - 15.2 % Final   ??? MPV 06/25/2021 9.7  6.8 - 10.7 fL Final   ??? Platelet 06/25/2021 391  150 - 450 10*9/L Final   ??? Neutrophils % 06/25/2021 52.5  % Final   ??? Lymphocytes % 06/25/2021 37.6  % Final   ??? Monocytes % 06/25/2021 3.7  % Final   ??? Eosinophils % 06/25/2021 3.2  % Final   ??? Basophils % 06/25/2021 3.0  % Final   ??? Absolute Neutrophils 06/25/2021 3.9 1.8 - 7.8 10*9/L Final   ??? Absolute Lymphocytes 06/25/2021 2.8  1.1 - 3.6 10*9/L Final   ??? Absolute Monocytes 06/25/2021 0.3  0.3 - 0.8 10*9/L Final   ??? Absolute Eosinophils 06/25/2021 0.2  0.0 - 0.5 10*9/L Final   ??? Absolute Basophils 06/25/2021 0.2 (A) 0.0 - 0.1 10*9/L Final   ??? Hypochromasia 06/25/2021 Slight (A) Not Present Final   ??? Case Report 06/25/2021    Final                    Value:Molecular Genetics Report                         Case: [id number]                                 Authorizing Provider:  Leta Speller        Collected:           06/25/2021 1445                                     Coombs, MD                                                                   Ordering Location:     [facility] ADULT ON[facility]    Received:            06/25/2021 1536                                     [employer] [place]  Pathologist:           Prudencio Burly, MD                                                      Specimen:    Blood                                                                                     ??? Specimen Type 06/25/2021    Final                    Value:Blood   ??? BCR/ABL1 p210 Assay 06/25/2021 Positive   Final   ??? BCR/ABL1 p210 IS% Ratio 06/25/2021 34.403   Final   ??? BCR/ABL1 p210 Assay Results 06/25/2021    Final                    Value:This result contains rich text formatting which cannot be displayed here.       Allergies:   Allergies   Allergen Reactions   ??? Lisinopril Shortness Of Breath   ??? Naproxen Nausea Only, Palpitations and Other (See Comments)     Chest palpitations and feels like flying   ??? Aripiprazole Other (See Comments)     Inability to urinate     ??? Fluphenazine      mental health problems   ??? Lactase      Other reaction(s): Unknown   ??? Metoclopramide      Mania   ??? Prednisone Other (See Comments)     mania   ??? Reglan [Metoclopramide Hcl] Other (See Comments)     Induces mania   ??? Diphenhydramine Hcl Anxiety   ??? Multihance [Gadobenate Dimeglumine] Nausea And Vomiting   ??? Ondansetron Hcl Anxiety   ??? Promethazine Anxiety     Drug Interactions: None identified; CTM medication profile for addition of QTc prolongers/CYP3A4 inhibitors    Current Medications:  Current Outpatient Medications   Medication Sig Dispense Refill   ??? albuterol HFA 90 mcg/actuation inhaler Inhale 2 puffs every six (6) hours as needed. 8 g 11   ??? atenoloL (TENORMIN) 100 MG tablet Take 1 tablet (100 mg total) by mouth in the morning. 90 tablet 3   ??? blood sugar diagnostic (ACCU-CHEK GUIDE TEST STRIPS) Strp Check sugars before meals three times for insulin dependent type two diabetes. 100 each 11   ??? blood-glucose meter kit Use as instructed - pt prefers a larger monitor glucometer is available 1 each 0   ??? bosutinib 400 mg Tab Take 1 tablet (400 mg) by mouth daily. 30 tablet 0   ??? cetirizine (ZYRTEC) 10 MG tablet Take 1 tablet (10 mg total) by mouth in the morning. 90 tablet 3   ??? divalproex (DEPAKOTE) 250 MG DR tablet Take 250 mg by mouth every evening.   1   ??? ferrous sulfate 325 (65 FE) MG tablet Take 1 tablet by mouth daily.     ??? flash glucose sensor (FLASH  GLUCOSE SENSOR) kit by Other route every fourteen (14) days. 1 each 11   ??? furosemide (LASIX) 40 MG tablet Take 1 tablet (40 mg total) by mouth every other day. 45 tablet 3   ??? hydroCHLOROthiazide (HYDRODIURIL) 25 MG tablet Take 1 tablet (25 mg total) by mouth in the morning. 90 tablet 3   ??? hydrOXYzine (ATARAX) 50 MG tablet Take 50 mg by mouth every six (6) hours as needed for anxiety (insomnia).     ??? insulin detemir U-100 (LEVEMIR) 100 unit/mL (3 mL) injection pen Inject 0.9 mL (90 Units total) under the skin Two (2) times a day. 48 mL 0   ??? insulin lispro (HUMALOG KWIKPEN INSULIN) 100 unit/mL injection pen Check blood sugars before meals and bedtime.  Take sliding scale insulin as directed: for blood sugars < 150 = no insulin; blood sugars 151-200 = take 2 units; blood sugars 201-250 = 4 units; blood sugars 251-300 = 6 units; blood sugars 301-350 = 8 units; blood sugars 351-400 = take 10 units; blood sugars > 400 = take 12 units and recheck blood sugar in 1 hour and reapply sliding scale insulin (max dose insulin = 36 units).  If blood sugars remain elevated > 400 after taking max dose of insulin, go to nearest ER to be evaluated. 3 mL 12   ??? lamoTRIgine (LAMICTAL) 200 MG tablet Take 200 mg by mouth Two (2) times a day.     ??? lancets (ACCU-CHEK SOFTCLIX LANCETS) Misc Check sugar three times per day before meals for insulin dependent type two diabetes.  E11.65 100 each 11   ??? metFORMIN (GLUCOPHAGE) 1000 MG tablet Take 1 tablet (1,000 mg total) by mouth in the morning and 1 tablet (1,000 mg total) in the evening. Take with meals. 180 tablet 3   ??? norethindrone (AYGESTIN) 5 mg tablet Take 1 tablet (5 mg total) by mouth in the morning. 30 tablet 11   ??? pantoprazole (PROTONIX) 40 MG tablet Take 1 tablet (40 mg total) by mouth in the morning. 90 tablet 3   ??? pen needle, diabetic (NOVOFINE 32) 32 gauge x 1/4 (6 mm) Ndle 1 each by Miscellaneous route daily. 100 each 11   ??? pregabalin (LYRICA) 75 MG capsule TAKE 1 CAPSULE BY MOUTH EVERY MORNING and 2 EVERY EVENING 270 capsule 3   ??? traZODone (DESYREL) 100 MG tablet Take 200 mg by mouth nightly.      ??? valsartan (DIOVAN) 160 MG tablet TAKE ONE TABLET BY MOUTH EVERY DAY 30 tablet 5   ??? ziprasidone (GEODON) 80 MG capsule Take 80 mg by mouth 2 (two) times a day with meals.        No current facility-administered medications for this visit.       Adherence: No barriers identified; reports no missed doses of imatinib    Assessment: Ms.Shelnutt is a 30 y.o. female with [name] being treated currently with imatinib, with CHR, but inadequate BCR-ABL response after 3 months of imatinib therapy (02/28/21 35.7 > 06/25/21 34.4), despite perfect adherence. Will start second-generation bosutinib in hopes of improved response. Bosutinib in fill process at [facility] and will be deliver to patient within next 1-2 days.      Plan:   - Start bosutinib 400 mg PO every day, with plans to titrate to 500mg  PO every day pending tolerability after 2-4 weeks  - Stop imatinib 400 mg PO every day upon receipt of bosutinib delivery   - Lupron dose due at next clinic visit  on 07/25/21  - Continue Aygestin for bone support with longterm lupron use (chosen to aid in fertility prolongation). Continue Ca/VitD  - F/U with Dr. Vertell Limber on 07/25/21 to assess bosutinib tolerability, labs, etc   - Repeat BCR-ABL due 09/2021    F/u:  Future Appointments   Date Time Provider Department Center   07/09/2021 10:30 AM ADULT ONC LAB [facility]   07/09/2021 11:30 AM Sean Marrian Salvage, AGNP [id number]   07/09/2021 12:00 PM ONCHEM LEUKEMIA PHARMACIST [id number]   07/15/2021  3:30 PM Tarri Abernethy, FNP UNCDIABENDET [facility]   07/18/2021  3:00 PM Aggie Moats, MD [facility]   07/25/2021  1:40 PM Nurum Joeseph Amor, MD INTMED[facility]   07/25/2021  2:45 PM ADULT ONC LAB [facility]   07/25/2021  3:30 PM Avie Arenas, MD [id number]   09/04/2021 11:30 AM Hilda Blades, MD UNCPULSPCLET [facility]   09/23/2021  3:20 PM Vikki Valentina Lucks Metheny, ANP UNCGIMEDET [facility]       I spent 15 minutes with Ms.Martinez in direct patient care.    Sandre Kitty, PharmD  PGY-2 Oncology Pharmacy Resident     Manfred Arch, PharmD, BCOP, CPP  Pager: [phone]
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
## 2021-07-10 NOTE — Unmapped (Signed)
Addended by: Christie Beckers on: 07/10/2021 10:33 AM     Modules accepted: Orders

## 2021-07-10 NOTE — Unmapped (Addendum)
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  I am speaking to the patient.    Was a Nurse, learning disability used for this call? No    Verified patient's date of birth / HIPAA.    Specialty medication(s) to be sent: Hematology/Oncology: Bosulif 400 mg tablets    Non-specialty medications/supplies to be sent: N/A    Medications not needed at this time: N/A     The patient declined counseling on medication administration, missed dose instructions, goals of therapy, side effects and monitoring parameters, warnings and precautions, drug/food interactions and storage, handling precautions, and disposal because they were counseled in clinic. The information in the declined sections below are for informational purposes only and was not discussed with patient.     Bosulif (bosutinib)    Medication & Administration     Dosage: Take 1 tablet (400 mg) by mouth daily    Administration: Administer with food. Swallow tablet whole, do not cut, crush, or break.    Adherence/Missed dose instructions: If a dose is missed beyond 12 hours, skip the dose and resume the usual dose the following day    Goals of Therapy     Prevent disease progression    Side Effects & Monitoring Parameters     ??? Abdominal pain  ??? Nausea, vomiting  ??? Diarrhea  ??? Common cold symptoms  ??? Stuffy nose, sore throat, lack of appetite  ??? Dizziness  ??? Fatigue  ??? Headache  ??? Joint pain  ??? Back pain    The following side effects should be reported to the provider:  ??? Signs/symptoms of infection  ??? Signs/symptoms of liver problems, like dark urine, fatigue, lack of appetits, nausea, abdominal pain, light colored stools, vomiting, or yellow skin or eyes  ??? Signs/sympotoms of kidney problems, like unable to pass urine, blood in the urine, change in amount of urine passed, or weight gain  ??? Passing a lot of urine  ??? Trouble breathing  ??? Weight gain  ??? Chest pain  ??? Swelling  ??? Severe loss of strength and energy  ??? Unusual bruising or bleeding  ??? Signs of a significant reaction like wheezing, chest tirhgness, fever, itching, bad cough, blue skin color, seizures, or swelling of face, lips, or throat    Monitoring Parameters:  ??? CBC with differential and platelets  ??? Hepatic enzymes  ??? Renal function  ??? Diarrhea episodes  ??? Fluid/edema status  ??? Adherence   ??? Pregnancy status    Contraindications, Warnings, & Precautions     ??? Bone marrow suppression  ??? Fluid retention/edema  ??? GI toxicity: diarrhea, nausea, vomiting, and abdominal pain  ??? Hepatotoxicity  ??? Hypersensitivity  ??? Pancreatitis  ??? QT Prolongation  ??? Renal toxicity   ??? Use caution in patients with hepatic or renal impairment  ??? Pregnancy/lactation - may cause fetal harm if administered during pregnancy. Breastfeeding is not recommended during therapy and for at least 2 weeks after last dose    Drug/Food Interactions     ??? Medication list reviewed in Epic. The patient was instructed to inform the care team before taking any new medications or supplements. No drug interactions identified.   ??? Separate from antacid and H2 blocker doses by at least 2 hours  ??? Avoid proton pump inhibitors  ??? Avoid grapefruit and grapefruit juice  ??? Avoid St John's Wort  Storage, Handling Precautions, & Disposal     ??? Store at room temperature   ??? This drug is considered hazardous and should be handled as little as possible.  If someone else helps with medication administration, they should wear gloves.  ??? Keep out of reach of others including children and pets. Do not throw away or flush unused medication down the toilet or sink.     Current Medications (including OTC/herbals), Comorbidities and Allergies     Current Outpatient Medications   Medication Sig Dispense Refill   ??? albuterol HFA 90 mcg/actuation inhaler Inhale 2 puffs every six (6) hours as needed. 8 g 11   ??? atenoloL (TENORMIN) 100 MG tablet Take 1 tablet (100 mg total) by mouth in the morning. 90 tablet 3   ??? blood sugar diagnostic (ACCU-CHEK GUIDE TEST STRIPS) Strp Check sugars before meals three times for insulin dependent type two diabetes. 100 each 11   ??? blood-glucose meter kit Use as instructed - pt prefers a larger monitor glucometer is available 1 each 0   ??? bosutinib 400 mg Tab Take 1 tablet (400 mg) by mouth daily. (Patient not taking: Reported on 07/09/2021) 30 tablet 0   ??? calcium carbonate/vitamin D3 (CALTRATE 600 + D ORAL) Take 1 Dose by mouth.     ??? cetirizine (ZYRTEC) 10 MG tablet Take 1 tablet (10 mg total) by mouth in the morning. 90 tablet 3   ??? divalproex (DEPAKOTE) 250 MG DR tablet Take 250 mg by mouth every evening.   1   ??? ferrous sulfate 325 (65 FE) MG tablet Take 1 tablet by mouth daily.     ??? flash glucose sensor (FLASH GLUCOSE SENSOR) kit by Other route every fourteen (14) days. 1 each 11   ??? furosemide (LASIX) 40 MG tablet Take 1 tablet (40 mg total) by mouth every other day. 45 tablet 3   ??? hydroCHLOROthiazide (HYDRODIURIL) 25 MG tablet Take 1 tablet (25 mg total) by mouth in the morning. 90 tablet 3   ??? hydrOXYzine (ATARAX) 50 MG tablet Take 50 mg by mouth every six (6) hours as needed for anxiety (insomnia).     ??? insulin detemir U-100 (LEVEMIR) 100 unit/mL (3 mL) injection pen Inject 0.9 mL (90 Units total) under the skin Two (2) times a day. 48 mL 0   ??? insulin lispro (HUMALOG KWIKPEN INSULIN) 100 unit/mL injection pen Check blood sugars before meals and bedtime.  Take sliding scale insulin as directed: for blood sugars < 150 = no insulin; blood sugars 151-200 = take 2 units; blood sugars 201-250 = 4 units; blood sugars 251-300 = 6 units; blood sugars 301-350 = 8 units; blood sugars 351-400 = take 10 units; blood sugars > 400 = take 12 units and recheck blood sugar in 1 hour and reapply sliding scale insulin (max dose insulin = 36 units).  If blood sugars remain elevated > 400 after taking max dose of insulin, go to nearest ER to be evaluated. 3 mL 12   ??? lamoTRIgine (LAMICTAL) 200 MG tablet Take 200 mg by mouth Two (2) times a day.     ??? lancets (ACCU-CHEK SOFTCLIX LANCETS) Misc Check sugar three times per day before meals for insulin dependent type two diabetes.  E11.65 100 each 11   ??? metFORMIN (GLUCOPHAGE) 1000 MG tablet Take 1 tablet (1,000 mg total) by mouth in the morning and 1 tablet (1,000 mg total) in the evening. Take with meals. 180 tablet 3   ??? norethindrone (  AYGESTIN) 5 mg tablet Take 1 tablet (5 mg total) by mouth in the morning. 30 tablet 11   ??? pantoprazole (PROTONIX) 40 MG tablet Take 1 tablet (40 mg total) by mouth in the morning. 90 tablet 3   ??? pen needle, diabetic (NOVOFINE 32) 32 gauge x 1/4 (6 mm) Ndle 1 each by Miscellaneous route daily. 100 each 11   ??? pregabalin (LYRICA) 75 MG capsule TAKE 1 CAPSULE BY MOUTH EVERY MORNING and 2 EVERY EVENING 270 capsule 3   ??? traZODone (DESYREL) 100 MG tablet Take 200 mg by mouth nightly.      ??? valsartan (DIOVAN) 160 MG tablet TAKE ONE TABLET BY MOUTH EVERY DAY 30 tablet 5   ??? ziprasidone (GEODON) 80 MG capsule Take 80 mg by mouth 2 (two) times a day with meals.        No current facility-administered medications for this visit.       Allergies   Allergen Reactions   ??? Lisinopril Shortness Of Breath   ??? Naproxen Nausea Only, Palpitations and Other (See Comments)     Chest palpitations and feels like flying   ??? Aripiprazole Other (See Comments)     Inability to urinate     ??? Fluphenazine      mental health problems   ??? Lactase      Other reaction(s): Unknown   ??? Metoclopramide      Mania   ??? Prednisone Other (See Comments)     mania   ??? Reglan [Metoclopramide Hcl] Other (See Comments)     Induces mania   ??? Diphenhydramine Hcl Anxiety   ??? Multihance [Gadobenate Dimeglumine] Nausea And Vomiting   ??? Ondansetron Hcl Anxiety   ??? Promethazine Anxiety       Patient Active Problem List   Diagnosis   ??? Esophageal reflux   ??? Essential (primary) hypertension   ??? Morbid obesity (CMS-HCC)   ??? Obstructive sleep apnea   ??? Polycystic ovarian syndrome   ??? Bipolar I disorder: With psychotic features, Current or most recent episode depressed, with mixed features (CMS-HCC)   ??? Posttraumatic stress disorder   ??? Decreased hearing of both ears   ??? Seborrheic dermatitis of scalp   ??? Pelvic mass   ??? Morbid obesity with body mass index (BMI) of 50.0 to 59.9 in adult (CMS-HCC)   ??? Diarrhea   ??? Therapeutic drug monitoring   ??? H/O non anemic vitamin B12 deficiency   ??? Schizo affective schizophrenia (CMS-HCC)   ??? Anxiety   ??? Vaginal candida   ??? BMI 45.0-49.9, adult (CMS-HCC)   ??? Urinary frequency   ??? Thyromegaly   ??? Elevated platelet count   ??? Leukocytosis   ??? Type 2 diabetes mellitus without complication, with long-term current use of insulin (CMS-HCC)   ??? [name] (chronic myelocytic leukemia) (CMS-HCC)   ??? Fatty liver   ??? Focal nodular hyperplasia of liver   ??? Type 2 diabetes mellitus with hyperglycemia, with long-term current use of insulin (CMS-HCC)   ??? Toenail deformity   ??? Intestinal malabsorption       Reviewed and up to date in Epic.    Appropriateness of Therapy     Acute infections noted within Epic:  No active infections  Patient reported infection: None    Is medication and dose appropriate based on diagnosis and infection status? Yes    Prescription has been clinically reviewed: Yes    Baseline Quality of Life Assessment      How  many days over the past month did your [name]  keep you from your normal activities? For example, brushing your teeth or getting up in the morning. Patient declined to answer    Financial Information     Medication Assistance provided: None Required    Anticipated copay of $4/30 day supply reviewed with patient. Verified delivery address.    Delivery Information     Scheduled delivery date: 07/11/21    Expected start date: 07/11/21    Medication will be delivered via Same Day Courier to the prescription address in [facility].  This shipment will not require a signature.      Explained the services we provide at [facility] and that each month we would call to set up refills.  Stressed importance of returning phone calls so that we could ensure they receive their medications in time each month.  Informed patient that we should be setting up refills 7-10 days prior to when they will run out of medication.  A pharmacist will reach out to perform a clinical assessment periodically.  Informed patient that a welcome packet, containing information about our pharmacy and other support services, a Notice of Privacy Practices, and a drug information handout will be sent.      The patient or caregiver noted above participated in the development of this care plan and knows that they can request review of or adjustments to the care plan at any time.      Patient or caregiver verbalized understanding of the above information as well as how to contact the pharmacy at [phone] option 4 with any questions/concerns.  The pharmacy is open Monday through Friday 8:30am-4:30pm.  A pharmacist is available 24/7 via pager to answer any clinical questions they may have.    Patient Specific Needs     - Does the patient have any physical, cognitive, or cultural barriers? No    - Does the patient have adequate living arrangements? (i.e. the ability to store and take their medication appropriately) Yes    - Did you identify any home environmental safety or security hazards? No    - Patient prefers to have medications discussed with  Patient     - Is the patient or caregiver able to read and understand education materials at a high school level or above? No    - Patient's primary language is  English     - Is the patient high risk? Yes, patient is taking oral chemotherapy. Appropriateness of therapy as been assessed    - Does the patient require physician intervention or other additional services (i.e. dietary/nutrition, smoking cessation, social work)? No      Nonie Hoyer, PharmD    PGY1 Community-based Pharmacy Resident  [facility]    I reviewed this patient case and all documentation provided by the learner and was readily available for consultation during their interaction with the patient.  I agree with the assessment and plan listed below.    Breck Coons Shared [facility] Specialty Pharmacist

## 2021-07-15 ENCOUNTER — Ambulatory Visit: Admit: 2021-07-15 | Discharge: 2021-07-16 | Payer: MEDICAID

## 2021-07-15 DIAGNOSIS — C9210 Chronic myeloid leukemia, BCR/ABL-positive, not having achieved remission: Principal | ICD-10-CM

## 2021-07-15 DIAGNOSIS — Z794 Long term (current) use of insulin: Principal | ICD-10-CM

## 2021-07-15 DIAGNOSIS — E119 Type 2 diabetes mellitus without complications: Principal | ICD-10-CM

## 2021-07-15 MED ORDER — TRESIBA FLEXTOUCH U-200 INSULIN 200 UNIT/ML (3 ML) SUBCUTANEOUS PEN
3 refills | 0 days | Status: CP
Start: 2021-07-15 — End: ?

## 2021-07-15 MED ORDER — KETONE URINE TEST STRIPS
ORAL_STRIP | 2 refills | 0.00000 days | Status: CP
Start: 2021-07-15 — End: ?

## 2021-07-15 MED ORDER — LIRAGLUTIDE 0.6 MG/0.1 ML (18 MG/3 ML) SUBCUTANEOUS PEN INJECTOR
Freq: Every day | SUBCUTANEOUS | 12 refills | 90 days | Status: CP
Start: 2021-07-15 — End: 2022-07-15

## 2021-07-15 MED ORDER — PEN NEEDLE, DIABETIC 32 GAUGE X 1/4" (6 MM)
Freq: Every day | 11 refills | 0.00000 days | Status: CP
Start: 2021-07-15 — End: ?

## 2021-07-15 NOTE — Unmapped (Signed)
Meter  downloaded. POC glucose and A1C done today. PP 1:00 pm. 373 mg/dL. Gave patient water.      Jenna Mosley  Date of birth: 10-23-1991  Date of diagnosis:   Exported from [facility]: Jul 15, 2021    Reporting Period: Aug 14 - Jul 13, 2021    Avg. Daily Readings In Range (mg/dL) from 7 readings  >295     57% (0.3 readings/day)  180-250  29% (0.1 readings/day)  70-180   14% (0.1 readings/day)  54-70    0% (0 readings/day)  <54      0% (0 readings/day)    Avg. Glucose (BGM): 274 mg/dL    Avg. Daily Insulin Ratio  Basal  --  Bolus  --    Avg. Daily Carbs: --    Std. Deviation (BGM): 72 mg/dL    CV (BGM): 28%    BG Extents (BGM) (mg/dL)  Min BG  413  Max BG  378    Avg BG readings / day  1  Meter                  7  Manual                 0  Below 54 mg/dL         0  Above 244 mg/dL        4    Total basal events  0  Temp Basals         0  Suspends            0      Jenna Mosley  Date of birth: 1990/11/27  Date of diagnosis:   Exported from [facility] [facility]: Jul 15, 2021    Reporting Period: Jul 31 - Jul 13, 2021    Avg. Daily Readings In Range (mg/dL) from 9 readings  >010     56% (0.2 readings/day)  180-250  33% (0.1 readings/day)  70-180   11% (0.04 readings/day)  54-70    0% (0 readings/day)  <54      0% (0 readings/day)    Avg. Glucose (BGM): 265 mg/dL    Avg. Daily Insulin Ratio  Basal  --  Bolus  --    Std. Deviation (BGM): 67 mg/dL    CV (BGM): 27%    BG Extents (BGM) (mg/dL)  Min BG  253  Max BG  378

## 2021-07-15 NOTE — Unmapped (Addendum)
Victoza - take 0.6mg  once a day for 7 days  Then increase to 1.2mg  once a day for 7 days  Then go to 1.8mg  once a day forever    TRESIBA WILL REPLACE LEVEMIR.  Dont take them both.  If insurance pays for Evaristo Bury, take 180u once a day.  Once a week, increase dose by 10u until fasting sugars are under 160.    If insurance doesn't pay for Evaristo Bury, keep taking Levemir.  Take 90u twice a day at first.  Once a week, increase dose by 5u twice a day until fasting sugars are under 160.    Send me the most recent week of sugars in 3 weeks.  Test before meals and at bedtime.  DO NOT TEST IF YOU ALREADY ATE.  When you send me a message, write down for example:  Monday breakfast: 200  Dinner: 265  Bedtime: 302  Let me know how much insulin you're taking too    Stop humalog for now
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
## 2021-07-18 ENCOUNTER — Ambulatory Visit
Admit: 2021-07-18 | Discharge: 2021-07-19 | Payer: MEDICAID | Attending: Obstetrics & Gynecology | Primary: Obstetrics & Gynecology

## 2021-07-18 DIAGNOSIS — C9210 Chronic myeloid leukemia, BCR/ABL-positive, not having achieved remission: Principal | ICD-10-CM

## 2021-07-18 DIAGNOSIS — Z3169 Encounter for other general counseling and advice on procreation: Principal | ICD-10-CM

## 2021-07-18 NOTE — Unmapped (Signed)
Stay on the Lupron until the [facility] is doing well.  Work on your diabetes and weight when you get the chance.

## 2021-07-19 NOTE — Unmapped (Signed)
Prior Authorization for Jenna Mosley U-200  Approved        Confirmation #:[id number]  Prior Approval [phone]  Status:APPROVED  Effective 07/19/21-07/19/22

## 2021-07-23 DIAGNOSIS — I10 Essential (primary) hypertension: Principal | ICD-10-CM

## 2021-07-23 MED ORDER — HYDROXYZINE PAMOATE 50 MG CAPSULE
ORAL_CAPSULE | 3 refills | 0 days | Status: CP
Start: 2021-07-23 — End: ?

## 2021-07-23 MED ORDER — VALSARTAN 160 MG TABLET
ORAL_TABLET | Freq: Every day | ORAL | 5 refills | 0.00000 days | Status: CP
Start: 2021-07-23 — End: ?

## 2021-07-23 MED ORDER — LAMOTRIGINE 200 MG TABLET
ORAL_TABLET | 3 refills | 0 days | Status: CP
Start: 2021-07-23 — End: ?

## 2021-07-23 MED ORDER — TRAZODONE 100 MG TABLET
ORAL_TABLET | 3 refills | 0 days | Status: CP
Start: 2021-07-23 — End: ?

## 2021-07-23 MED ORDER — DIVALPROEX ER 500 MG TABLET,EXTENDED RELEASE 24 HR
ORAL_TABLET | 3 refills | 0 days | Status: CP
Start: 2021-07-23 — End: ?

## 2021-07-23 NOTE — Unmapped (Signed)
Patient is requesting the following refill  Requested Prescriptions     Pending Prescriptions Disp Refills   ??? divalproex ER (DEPAKOTE ER) 500 MG extended released 24 hr tablet [Pharmacy Med Name: DIVALPROEX SODIUM ER 500 MG TAB] 30 tablet 0     Sig: TAKE 1 TABLET BY MOUTH AT BEDTIME   ??? lamoTRIgine (LAMICTAL) 200 MG tablet [Pharmacy Med Name: LAMOTRIGINE 200 MG TAB] 60 tablet 0     Sig: TAKE 1 TABLET BY MOUTH TWICE DAILY   ??? hydrOXYzine (VISTARIL) 50 MG capsule [Pharmacy Med Name: HYDROXYZINE PAMOATE 50 MG CAP] 30 capsule 0     Sig: TAKE 1 CAPSULE BY MOUTH AT BEDTIME AS NEEDED TO IMPROVE SLEEP   ??? traZODone (DESYREL) 100 MG tablet [Pharmacy Med Name: TRAZODONE HCL 100 MG TAB] 60 tablet 0     Sig: TAKE 2 TABLETS BY MOUTH AT BEDTIME       Order pended. Please advise. Thanks    Last OV: 06/26/2021   Next OV: 07/25/2021
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
## 2021-07-25 ENCOUNTER — Institutional Professional Consult (permissible substitution): Admit: 2021-07-25 | Discharge: 2021-07-25 | Payer: MEDICAID

## 2021-07-25 ENCOUNTER — Ambulatory Visit
Admit: 2021-07-25 | Discharge: 2021-07-25 | Payer: MEDICAID | Attending: Hematology & Oncology | Primary: Hematology & Oncology

## 2021-07-25 ENCOUNTER — Ambulatory Visit: Admit: 2021-07-25 | Discharge: 2021-07-25 | Payer: MEDICAID

## 2021-07-25 ENCOUNTER — Ambulatory Visit
Admit: 2021-07-25 | Discharge: 2021-07-25 | Payer: MEDICAID | Attending: Geriatric Medicine | Primary: Geriatric Medicine

## 2021-07-25 DIAGNOSIS — R300 Dysuria: Principal | ICD-10-CM

## 2021-07-25 DIAGNOSIS — Z794 Long term (current) use of insulin: Principal | ICD-10-CM

## 2021-07-25 DIAGNOSIS — C9210 Chronic myeloid leukemia, BCR/ABL-positive, not having achieved remission: Principal | ICD-10-CM

## 2021-07-25 DIAGNOSIS — E1140 Type 2 diabetes mellitus with diabetic neuropathy, unspecified: Principal | ICD-10-CM

## 2021-07-25 DIAGNOSIS — I10 Essential (primary) hypertension: Principal | ICD-10-CM

## 2021-07-25 DIAGNOSIS — K5903 Drug induced constipation: Principal | ICD-10-CM

## 2021-07-25 DIAGNOSIS — E119 Type 2 diabetes mellitus without complications: Principal | ICD-10-CM

## 2021-07-25 DIAGNOSIS — Z6841 Body Mass Index (BMI) 40.0 and over, adult: Principal | ICD-10-CM

## 2021-07-25 DIAGNOSIS — F315 Bipolar disorder, current episode depressed, severe, with psychotic features: Principal | ICD-10-CM

## 2021-07-25 LAB — COMPREHENSIVE METABOLIC PANEL
ALBUMIN: 4.2 g/dL (ref 3.4–5.0)
ALKALINE PHOSPHATASE: 157 U/L — ABNORMAL HIGH (ref 46–116)
ALT (SGPT): 11 U/L (ref 10–49)
ANION GAP: 7 mmol/L (ref 5–14)
AST (SGOT): 18 U/L (ref <=34)
BILIRUBIN TOTAL: 0.4 mg/dL (ref 0.3–1.2)
BLOOD UREA NITROGEN: 16 mg/dL (ref 9–23)
BUN / CREAT RATIO: 15
CALCIUM: 10.2 mg/dL (ref 8.7–10.4)
CHLORIDE: 102 mmol/L (ref 98–107)
CO2: 22 mmol/L (ref 20.0–31.0)
CREATININE: 1.05 mg/dL — ABNORMAL HIGH
EGFR CKD-EPI (2021) FEMALE: 73 mL/min/{1.73_m2} (ref >=60)
GLUCOSE RANDOM: 397 mg/dL — ABNORMAL HIGH (ref 70–179)
POTASSIUM: 4.4 mmol/L (ref 3.4–4.8)
PROTEIN TOTAL: 7.6 g/dL (ref 5.7–8.2)
SODIUM: 131 mmol/L — ABNORMAL LOW (ref 135–145)

## 2021-07-25 LAB — CBC W/ AUTO DIFF
BASOPHILS ABSOLUTE COUNT: 0 10*9/L (ref 0.0–0.1)
BASOPHILS RELATIVE PERCENT: 0.3 %
EOSINOPHILS ABSOLUTE COUNT: 0.3 10*9/L (ref 0.0–0.5)
EOSINOPHILS RELATIVE PERCENT: 3 %
HEMATOCRIT: 37.9 % (ref 34.0–44.0)
HEMOGLOBIN: 12.5 g/dL (ref 11.3–14.9)
LYMPHOCYTES ABSOLUTE COUNT: 3.1 10*9/L (ref 1.1–3.6)
LYMPHOCYTES RELATIVE PERCENT: 36.3 %
MEAN CORPUSCULAR HEMOGLOBIN CONC: 32.9 g/dL (ref 32.0–36.0)
MEAN CORPUSCULAR HEMOGLOBIN: 26.8 pg (ref 25.9–32.4)
MEAN CORPUSCULAR VOLUME: 81.6 fL (ref 77.6–95.7)
MEAN PLATELET VOLUME: 8.9 fL (ref 6.8–10.7)
MONOCYTES ABSOLUTE COUNT: 0.4 10*9/L (ref 0.3–0.8)
MONOCYTES RELATIVE PERCENT: 4.4 %
NEUTROPHILS ABSOLUTE COUNT: 4.8 10*9/L (ref 1.8–7.8)
NEUTROPHILS RELATIVE PERCENT: 56 %
PLATELET COUNT: 314 10*9/L (ref 150–450)
RED BLOOD CELL COUNT: 4.65 10*12/L (ref 3.95–5.13)
RED CELL DISTRIBUTION WIDTH: 15.7 % — ABNORMAL HIGH (ref 12.2–15.2)
WBC ADJUSTED: 8.6 10*9/L (ref 3.6–11.2)

## 2021-07-25 LAB — ALBUMIN / CREATININE URINE RATIO
ALBUMIN QUANT URINE: 4.8 mg/dL
ALBUMIN/CREATININE RATIO: 37.5 ug/mg — ABNORMAL HIGH (ref 0.0–30.0)
CREATININE, URINE: 128 mg/dL

## 2021-07-25 MED ADMIN — leuprolide (LUPRON) injection 11.25 mg: 11.25 mg | INTRAMUSCULAR | @ 19:00:00 | Stop: 2021-07-25

## 2021-07-25 NOTE — Unmapped (Signed)
BMI down to 45.  5 pound weight loss since last seen.  Some is likely due to hyperglycemia but also possibly secondary to side effects from new medication for [name].  We will continue to monitor.  Would like to see patient lose weight in general however need to make sure she maintains muscle mass by eating a diet with healthy lean proteins.

## 2021-07-25 NOTE — Unmapped (Signed)
>>  ASSESSMENT AND PLAN FOR DIABETES MELLITUS WITHOUT COMPLICATION (CMS-HCC) WRITTEN ON 07/25/2021  4:58 PM BY ERDEM, Mauricio Po, MD    Appreciate input from endocrine.  Anxiously await patient approval for medications.  Discussed the importance of sliding scale insulin and getting blood sugars under control.  Patient to schedule retinal eye exam at [facility].  Patient to take better care of her feet and examine her feet daily given peripheral neuropathy.

## 2021-07-25 NOTE — Unmapped (Signed)
Not typically side effect of bosutinib.  Possible due to autonomic dysfunction.  Patient may use half to a whole scoop of MiraLAX as needed.  Increase fluid intake.  Increase fiber in diet.

## 2021-07-25 NOTE — Unmapped (Signed)
Plan as per hematology oncology

## 2021-07-25 NOTE — Unmapped (Signed)
Appreciate input from endocrine.  Anxiously await patient approval for medications.  Discussed the importance of sliding scale insulin and getting blood sugars under control.  Patient to schedule retinal eye exam at [facility].  Patient to take better care of her feet and examine her feet daily given peripheral neuropathy.

## 2021-07-25 NOTE — Unmapped (Signed)
Urine negative for UTI.  Back pain unclear etiology.  We will send urine for culture.
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
## 2021-07-25 NOTE — Unmapped (Addendum)
We are going to try two more weeks of bosutinib and try to address your side effects. If in two weeks you are feeling better and tolerating it well we will increase your dose in 2 weeks.  You will return to clinic in 2 weeks for follow up with our nurse practitioner and pharmacist.    If you have questions or concerns at night or on the weekend, call the hospital operator at [phone] and ask to speak to the hematology/oncology fellow on call.     If you have questions or concerns on a week day during business hours, you may call the Nurse call line at [phone].    Appointment on 07/25/2021   Component Date Value Ref Range Status    WBC 07/25/2021 8.6  3.6 - 11.2 10*9/L Final    RBC 07/25/2021 4.65  3.95 - 5.13 10*12/L Final    HGB 07/25/2021 12.5  11.3 - 14.9 g/dL Final    HCT 64/40/3474 37.9  34.0 - 44.0 % Final    MCV 07/25/2021 81.6  77.6 - 95.7 fL Final    MCH 07/25/2021 26.8  25.9 - 32.4 pg Final    MCHC 07/25/2021 32.9  32.0 - 36.0 g/dL Final    RDW 25/95/6387 15.7 (A) 12.2 - 15.2 % Final    MPV 07/25/2021 8.9  6.8 - 10.7 fL Final    Platelet 07/25/2021 314  150 - 450 10*9/L Final    Neutrophils % 07/25/2021 56.0  % Final    Lymphocytes % 07/25/2021 36.3  % Final    Monocytes % 07/25/2021 4.4  % Final    Eosinophils % 07/25/2021 3.0  % Final    Basophils % 07/25/2021 0.3  % Final    Absolute Neutrophils 07/25/2021 4.8  1.8 - 7.8 10*9/L Final    Absolute Lymphocytes 07/25/2021 3.1  1.1 - 3.6 10*9/L Final    Absolute Monocytes 07/25/2021 0.4  0.3 - 0.8 10*9/L Final    Absolute Eosinophils 07/25/2021 0.3  0.0 - 0.5 10*9/L Final    Absolute Basophils 07/25/2021 0.0  0.0 - 0.1 10*9/L Final    Hypochromasia 07/25/2021 Slight (A) Not Present Final    Sodium 07/25/2021 131 (A) 135 - 145 mmol/L Final    Potassium 07/25/2021 4.4  3.4 - 4.8 mmol/L Final    Chloride 07/25/2021 102  98 - 107 mmol/L Final    CO2 07/25/2021 22.0  20.0 - 31.0 mmol/L Final    Anion Gap 07/25/2021 7  5 - 14 mmol/L Final    BUN 07/25/2021 16  9 - 23 mg/dL Final    Creatinine 56/43/3295 1.05 (A) 0.60 - 0.80 mg/dL Final    BUN/Creatinine Ratio 07/25/2021 15   Final    eGFR CKD-EPI (2021) Female 07/25/2021 73  >=60 mL/min/1.37m2 Final    eGFR calculated with CKD-EPI 2021 equation in accordance with [employer] and [employer] of Nephrology Task Force recommendations.    Glucose 07/25/2021 397 (A) 70 - 179 mg/dL Final    Calcium 18/84/1660 10.2  8.7 - 10.4 mg/dL Final    Albumin 63/11/6008 4.2  3.4 - 5.0 g/dL Final    Total Protein 07/25/2021 7.6  5.7 - 8.2 g/dL Final    Total Bilirubin 07/25/2021 0.4  0.3 - 1.2 mg/dL Final    AST 93/23/5573 18  <=34 U/L Final    ALT 07/25/2021 11  10 - 49 U/L Final    Alkaline Phosphatase 07/25/2021 157 (A) 46 - 116 U/L Final  Office Visit on 07/25/2021   Component Date Value Ref Range Status    Color, UA 07/25/2021 Yellow   Final    Clarity, UA 07/25/2021 Clear   Final    Glucose, UA 07/25/2021 2+  Negative Final    Bilirubin, UA 07/25/2021 Negative  Negative Final    Ketones, POC 07/25/2021 Negative  Negative Final    Spec Grav, UA 07/25/2021 1.015  1.005 - 1.030 Final    Blood, UA 07/25/2021 Negative  Negative Final    pH, UA 07/25/2021 5.5  5.0 - 9.0 Final    Protein, UA 07/25/2021 1+  Negative Final    Urobilinogen, UA 07/25/2021 0.2 E.U./dL  Negative (0.2 mg/dL) Final    Leukocytes, UA 07/25/2021 Negative  Negative Final    Nitrite, UA 07/25/2021 Negative  Negative Final    STRIP LOT NUMBER 07/25/2021 104,029   Final    STRIP LOT EXPIRATION 07/25/2021 08/16/2021   Final

## 2021-07-25 NOTE — Unmapped (Deleted)
I saw and evaluated the patient, and participated in the key portions of the service.  This is a highly complex patient which requires my evaluation and assessment. I reviewed the housestaff note below and agree with the plan. Additional findings are as follows:     Jenna Mosley is a 30 y.o. 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 Mutational analysis from 06/25/21 was negative. She started imatinib on approximately 03/26/2020 but her BCR-ABL did not improve. She switched to bosutinib. She presents today for follow up.      They have concerns about her tolerating, given history of fluid retention and family history of heart failure. I discussed that we have reviewed her underlying health issues and feel that bosutinib would be the best next step. She should notify us if she develops any new symptoms or worsening fluid retention (currently well controlled) and we will monitor her for tolerance. I also encouraged her to work on maximizing her overall health, including blood glucose management and increasing her activity.    She presents today doing ***. She is tolerating bosutinib ***.    COVID ppx: ***    ECOG PS = ***    Plan and Recommendations:  1) ***  2)  ______________________________________________________________________    Documentation assistance was provided by Tracey Harries, Scribe, on July 24, 2021 at 6:25 PM for Jimmye Norman, MD    {*** NOTE TO PROVIDER: PLEASE ADD ATTESTATION NOTING Rachel Bo WITH SCRIBE DOCUMENTATION}    Oncology History Overview Note   Diagnosis: [name]    Presentation:    Presenting WBC Count:    Bone Marrow Biopsy:  Bone marrow, right iliac, aspiration and biopsy (02/28/21)  -  Hypercellular bone marrow (>95%) involved by chronic myeloid leukemia, BCR/ABL-1-positive, chronic phase (1% blasts by manual aspirate differential)  -  No significant marrow fibrosis    Flow Cytometry:    Cytogenetics: Abnormal Karyotype. 46,XX,t(9;22)(q34;q11.2)[20]    Molecular Mutations:    Induction therapy: Initiated imatinib on 03/27/21.    BCR/ABL p210 02/28/21: 35.703  BCR/ABL p210 06/25/21: 34.403     [name] (chronic myelocytic leukemia) (CMS-HCC)   02/19/2021 Initial Diagnosis    [name] (chronic myelocytic leukemia) (CMS-HCC)     04/23/2021 Endocrine/Hormone Therapy    OP LEUPROLIDE (LUPRON) 11.25 MG EVERY 3 MONTHS  Plan Provider: Pernell Dupre, MD       Cindra Presume, MD

## 2021-07-25 NOTE — Unmapped (Signed)
Still marked depressive symptoms however patient feels that overall she is doing much better.  Seems like she is getting along well with her mother.  Continue current management.  Patient has follow-up with psychiatry and therapist.

## 2021-07-25 NOTE — Unmapped (Signed)
Patient with numbness bilateral feet, poor sensation on monofilament test.  No foot deformities

## 2021-07-25 NOTE — Unmapped (Signed)
taking valsartan 160mg  daily, atenolol 100mg  daily, hydrochlorothiazide 25mg  daily, and furosemide 40 mg every other day.
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
## 2021-07-25 NOTE — Unmapped (Signed)
Administered Lupron to left upper ventral gluteal. Gauze and bandaid applied. Patient tolerated well.

## 2021-08-05 NOTE — Unmapped (Signed)
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 She reports she is feeling very fatigued - sleeping 18 hours a day. She is so tired she doesn't feel like eating. Does also have some nausea when she eats. Using ginger chews for nausea. Also having headaches - has been trying APAP + caffeine for these. Overall, she thinks things are improving day by day but she is having a tough time. Having a hard time staying hydrated. Mouth sores clearing up.     Labs: WBC 8.6; ANC 4.8; Hgb 12.5; PLT 314; CMP with creatinine 1.05 (basline 0.9) and glucose 397 (did eat before lab-draw and on insulin tx follows with endocrine).     Oncologic History:  Oncology History Overview Note   Diagnosis: [name]    Bone Marrow Biopsy:  Bone marrow, right iliac, aspiration and biopsy (02/28/21)  -  Hypercellular bone marrow (>95%) involved by chronic myeloid leukemia, BCR/ABL-1-positive, chronic phase (1% blasts by manual aspirate differential)  -  No significant marrow fibrosis    Cytogenetics: Abnormal Karyotype. 46,XX,t(9;22)(q34;q11.2)[20]    Treatment: imatinib  - 03/27/21    BCR/ABL p210 06/25/21: 34.403%  BCR-ABL1 Mutation Analysis: Negative    Switched to bosutinib on 07/12/21.      [name] (chronic myelocytic leukemia) (CMS-HCC)   02/19/2021 Initial Diagnosis    [name] (chronic myelocytic leukemia) (CMS-HCC)     04/23/2021 Endocrine/Hormone Therapy    OP LEUPROLIDE (LUPRON) 11.25 MG EVERY 3 MONTHS  Plan Provider: Pernell Dupre, MD         Weight and Vitals:  Wt Readings from Last 3 Encounters:   07/25/21 (!) 150.6 kg (332 lb)   07/25/21 (!) 148.1 kg (326 lb 8 oz)   07/18/21 (!) 150.6 kg (332 lb 1.6 oz)     Temp Readings from Last 3 Encounters:   07/25/21 36.8 ??C (98.3 ??F) (Oral)   07/09/21 36.6 ??C (97.9 ??F) (Temporal)   06/25/21 36.8 ??C (98.2 ??F) (Oral)     BP Readings from Last 3 Encounters:   07/25/21 117/63   07/25/21 116/54   07/18/21 110/64     Pulse Readings from Last 3 Encounters:   07/25/21 82   07/25/21 77   07/18/21 66       Pertinent Labs:  Appointment on 07/25/2021   Component Date Value Ref Range Status   ??? WBC 07/25/2021 8.6  3.6 - 11.2 10*9/L Final   ??? RBC 07/25/2021 4.65  3.95 - 5.13 10*12/L Final   ??? HGB 07/25/2021 12.5  11.3 - 14.9 g/dL Final   ??? HCT 93/71/6967 37.9  34.0 - 44.0 % Final   ??? MCV 07/25/2021 81.6  77.6 - 95.7 fL Final   ??? MCH 07/25/2021 26.8  25.9 - 32.4 pg Final   ??? MCHC 07/25/2021 32.9  32.0 - 36.0 g/dL Final   ??? RDW 89/38/1017 15.7 (A) 12.2 - 15.2 % Final   ??? MPV 07/25/2021 8.9  6.8 - 10.7 fL Final   ??? Platelet 07/25/2021 314  150 - 450 10*9/L Final   ??? Neutrophils % 07/25/2021 56.0  % Final   ??? Lymphocytes % 07/25/2021 36.3  % Final   ???  Monocytes % 07/25/2021 4.4  % Final   ??? Eosinophils % 07/25/2021 3.0  % Final   ??? Basophils % 07/25/2021 0.3  % Final   ??? Absolute Neutrophils 07/25/2021 4.8  1.8 - 7.8 10*9/L Final   ??? Absolute Lymphocytes 07/25/2021 3.1  1.1 - 3.6 10*9/L Final   ??? Absolute Monocytes 07/25/2021 0.4  0.3 - 0.8 10*9/L Final   ??? Absolute Eosinophils 07/25/2021 0.3  0.0 - 0.5 10*9/L Final   ??? Absolute Basophils 07/25/2021 0.0  0.0 - 0.1 10*9/L Final   ??? Hypochromasia 07/25/2021 Slight (A) Not Present Final   ??? Sodium 07/25/2021 131 (A) 135 - 145 mmol/L Final   ??? Potassium 07/25/2021 4.4  3.4 - 4.8 mmol/L Final   ??? Chloride 07/25/2021 102  98 - 107 mmol/L Final   ??? CO2 07/25/2021 22.0  20.0 - 31.0 mmol/L Final   ??? Anion Gap 07/25/2021 7  5 - 14 mmol/L Final   ??? BUN 07/25/2021 16  9 - 23 mg/dL Final   ??? Creatinine 07/25/2021 1.05 (A) 0.60 - 0.80 mg/dL Final   ??? BUN/Creatinine Ratio 07/25/2021 15   Final   ??? eGFR CKD-EPI (2021) Female 07/25/2021 73  >=60 mL/min/1.66m2 Final    eGFR calculated with CKD-EPI 2021 equation in accordance with [employer] and [employer] of Nephrology Task Force recommendations.   ??? Glucose 07/25/2021 397 (A) 70 - 179 mg/dL Final   ??? Calcium 16/08/9603 10.2  8.7 - 10.4 mg/dL Final   ??? Albumin 54/07/8118 4.2  3.4 - 5.0 g/dL Final   ??? Total Protein 07/25/2021 7.6  5.7 - 8.2 g/dL Final   ??? Total Bilirubin 07/25/2021 0.4  0.3 - 1.2 mg/dL Final   ??? AST 14/78/2956 18  <=34 U/L Final   ??? ALT 07/25/2021 11  10 - 49 U/L Final   ??? Alkaline Phosphatase 07/25/2021 157 (A) 46 - 116 U/L Final   Office Visit on 07/25/2021   Component Date Value Ref Range Status   ??? Color, UA 07/25/2021 Yellow   Final   ??? Clarity, UA 07/25/2021 Clear   Final   ??? Glucose, UA 07/25/2021 2+  Negative Final   ??? Bilirubin, UA 07/25/2021 Negative  Negative Final   ??? Ketones, POC 07/25/2021 Negative  Negative Final   ??? Spec Grav, UA 07/25/2021 1.015  1.005 - 1.030 Final   ??? Blood, UA 07/25/2021 Negative  Negative Final   ??? pH, UA 07/25/2021 5.5  5.0 - 9.0 Final   ??? Protein, UA 07/25/2021 1+  Negative Final   ??? Urobilinogen, UA 07/25/2021 0.2 E.U./dL  Negative (0.2 mg/dL) Final   ??? Leukocytes, UA 07/25/2021 Negative  Negative Final   ??? Nitrite, UA 07/25/2021 Negative  Negative Final   ??? STRIP LOT NUMBER 07/25/2021 104,029   Final   ??? STRIP LOT EXPIRATION 07/25/2021 08/16/2021   Final   ??? Urine Culture, Comprehensive 07/25/2021 Mixed Urogenital Flora   Final   ??? Creat U 07/25/2021 128.0  Undefined mg/dL Final   ??? Albumin Quantitative, Urine 07/25/2021 4.8  Undefined mg/dL Final   ??? Albumin/Creatinine Ratio 07/25/2021 37.5 (A) 0.0 - 30.0 ug/mg Final       Allergies:   Allergies   Allergen Reactions   ??? Lisinopril Shortness Of Breath   ??? Naproxen Nausea Only, Palpitations and Other (See Comments)     Chest palpitations and feels like flying   ??? Aripiprazole Other (See Comments)     Inability to urinate     ???  Fluphenazine      mental health problems   ??? Lactase      Other reaction(s): Unknown   ??? Metoclopramide      Mania   ??? Prednisone Other (See Comments)     mania   ??? Reglan [Metoclopramide Hcl] Other (See Comments)     Induces mania   ??? Diphenhydramine Hcl Anxiety   ??? Multihance [Gadobenate Dimeglumine] Nausea And Vomiting   ??? Ondansetron Hcl Anxiety   ??? Promethazine Anxiety     Drug Interactions: None identified; CTM medication profile for addition of QTc prolongers/CYP3A4 inhibitors    Current Medications:  Current Outpatient Medications   Medication Sig Dispense Refill   ??? acetone, urine, test (KETONE URINE TEST) Strp Dispense 100.  Use as needed. 100 strip 2   ??? albuterol HFA 90 mcg/actuation inhaler Inhale 2 puffs every six (6) hours as needed. 8 g 11   ??? atenoloL (TENORMIN) 100 MG tablet Take 1 tablet (100 mg total) by mouth in the morning. 90 tablet 3   ??? blood sugar diagnostic (ACCU-CHEK GUIDE TEST STRIPS) Strp Check sugars before meals three times for insulin dependent type two diabetes. 100 each 11   ??? blood-glucose meter kit Use as instructed - pt prefers a larger monitor glucometer is available 1 each 0   ??? bosutinib 500 mg Tab Take 1 tablet (500 mg total) by mouth daily. 30 tablet 5   ??? calcium carbonate/vitamin D3 (CALTRATE 600 + D ORAL) Take 1 Dose by mouth.     ??? cetirizine (ZYRTEC) 10 MG tablet Take 1 tablet (10 mg total) by mouth in the morning. 90 tablet 3   ??? divalproex (DEPAKOTE) 250 MG DR tablet Take 250 mg by mouth every evening.   1   ??? divalproex ER (DEPAKOTE ER) 500 MG extended released 24 hr tablet TAKE 1 TABLET BY MOUTH AT BEDTIME 90 tablet 3   ??? ferrous sulfate 325 (65 FE) MG tablet Take 1 tablet by mouth daily.     ??? flash glucose sensor (FLASH GLUCOSE SENSOR) kit by Other route every fourteen (14) days. (Patient not taking: Reported on 07/25/2021) 1 each 11   ??? furosemide (LASIX) 40 MG tablet Take 1 tablet (40 mg total) by mouth every other day. 45 tablet 3   ??? hydroCHLOROthiazide (HYDRODIURIL) 25 MG tablet Take 1 tablet (25 mg total) by mouth in the morning. 90 tablet 3   ??? hydrOXYzine (ATARAX) 50 MG tablet Take 50 mg by mouth every six (6) hours as needed for anxiety (insomnia).     ??? hydrOXYzine (VISTARIL) 50 MG capsule TAKE 1 CAPSULE BY MOUTH AT BEDTIME AS NEEDED TO IMPROVE SLEEP 90 capsule 3   ??? insulin degludec (TRESIBA FLEXTOUCH U-200) 200 unit/mL (3 mL) InPn Use daily as directed by MD, up to 120 units SQ daily 54 mL 3   ??? insulin detemir U-100 (LEVEMIR) 100 unit/mL (3 mL) injection pen Inject 0.9 mL (90 Units total) under the skin Two (2) times a day. 48 mL 0   ??? insulin lispro (HUMALOG KWIKPEN INSULIN) 100 unit/mL injection pen Check blood sugars before meals and bedtime.  Take sliding scale insulin as directed: for blood sugars < 150 = no insulin; blood sugars 151-200 = take 2 units; blood sugars 201-250 = 4 units; blood sugars 251-300 = 6 units; blood sugars 301-350 = 8 units; blood sugars 351-400 = take 10 units; blood sugars > 400 = take 12 units and recheck blood sugar in 1 hour  and reapply sliding scale insulin (max dose insulin = 36 units).  If blood sugars remain elevated > 400 after taking max dose of insulin, go to nearest ER to be evaluated. 3 mL 12   ??? lamoTRIgine (LAMICTAL) 200 MG tablet TAKE 1 TABLET BY MOUTH TWICE DAILY 180 tablet 3   ??? lancets (ACCU-CHEK SOFTCLIX LANCETS) Misc Check sugar three times per day before meals for insulin dependent type two diabetes.  E11.65 100 each 11   ??? liraglutide (VICTOZA) injection pen Inject 0.3 mL (1.8 mg total) under the skin daily. 27 mL 12   ??? metFORMIN (GLUCOPHAGE) 1000 MG tablet Take 1 tablet (1,000 mg total) by mouth in the morning and 1 tablet (1,000 mg total) in the evening. Take with meals. 180 tablet 3   ??? norethindrone (AYGESTIN) 5 mg tablet Take 1 tablet (5 mg total) by mouth in the morning. 30 tablet 11   ??? pantoprazole (PROTONIX) 40 MG tablet Take 1 tablet (40 mg total) by mouth in the morning. 90 tablet 3   ??? pen needle, diabetic (NOVOFINE 32) 32 gauge x 1/4 (6 mm) Ndle 1 each by Miscellaneous route daily. 100 each 11   ??? pregabalin (LYRICA) 75 MG capsule TAKE 1 CAPSULE BY MOUTH EVERY MORNING and 2 EVERY EVENING 270 capsule 3   ??? traZODone (DESYREL) 100 MG tablet TAKE 2 TABLETS BY MOUTH AT BEDTIME 180 tablet 3   ??? valsartan (DIOVAN) 160 MG tablet Take 1 tablet (160 mg total) by mouth daily. 90 tablet 3   ??? ziprasidone (GEODON) 80 MG capsule Take 80 mg by mouth 2 (two) times a day with meals.        No current facility-administered medications for this visit.       Adherence: No barriers identified; reports no missed doses of bosutinib    Assessment: Ms.Herman is a 30 y.o. female with [name] being treated currently with imatinib, with CHR, but inadequate BCR-ABL response after 3 months of imatinib therapy (02/28/21 35.7 > 06/25/21 34.4), despite perfect adherence. Started second-generation bosutinib on 07/11/21. Jenna Mosley is experiencing grade 1 fatigue, nausea, and headaches with start of bosutinib - will proceed with supportive care as below and assess for improved tolerance in the coming weeks. If tolerated, may increase dose to 500 mg dose.    Plan:   1) [name]  - Continue bosutinib 400 mg PO every day, with plans to titrate to 500mg  PO every day pending tolerability after 2-4 additional weeks  - Repeat BCR-ABL due 09/2021    2) Menstrual suppression/ contraception  - Lupron dose due today 07/25/21, then next 10/23/21  - Continue Aygestin for bone support with longterm lupron use (chosen to aid in fertility prolongation). Continue Ca/VitD    3) Headaches  -Start ibuprofen 200 mg PRN headaches + continue caffeine   -Note intolerance to naproxen (nausea, palpitations); also on divalproex, lamotrigine, ziprasidone, pregabalin, trazodone, hydroxyzine - will need to screen for interactions prior to initiation of new therapy for headaches     4) Nausea  -Continue ginger chews PRN  -Note intolerances to zofran, promethazine, and metoclopramide (anxiety/mania)    F/u:  Future Appointments   Date Time Provider Department Center   08/08/2021  2:30 PM ADULT ONC LAB [facility]   08/08/2021  3:30 PM Avie Arenas, MD [id number]   08/08/2021  4:00 PM ONCHEM LEUKEMIA PHARMACIST [id number]   09/04/2021 11:30 AM Hilda Blades, MD UNCPULSPCLET [facility]   09/12/2021  2:00  PM Mercie Eon, MD OPHTHTNELS [facility]   09/26/2021  3:20 PM Vikki [address] [place], ANP Ranae Pila [facility]   10/15/2021  2:50 PM Lindalou Hose Dulcy Fanny, MD UNCDIABENDET [facility]   11/26/2021  2:40 PM Nurum Joeseph Amor, MD INTMEDWC [facility]   01/14/2022  4:00 PM Tarri Abernethy, FNP UNCDIABENDET [facility]       I spent 30 minutes with Ms.Katzenstein in direct patient care.    Ronnald Collum, PharmD, BCOP, CPP  Hematology/Oncology Clinical Pharmacist  Pager [phone]

## 2021-08-06 DIAGNOSIS — C9210 Chronic myeloid leukemia, BCR/ABL-positive, not having achieved remission: Principal | ICD-10-CM

## 2021-08-06 MED ORDER — BOSUTINIB 500 MG TABLET
ORAL_TABLET | Freq: Every day | ORAL | 5 refills | 30 days | Status: CP
Start: 2021-08-06 — End: ?
  Filled 2021-08-09: qty 30, 30d supply, fill #0

## 2021-08-06 NOTE — Unmapped (Signed)
Clinical Assessment Needed For: Dose Change  Medication: Bosulif 500mg   Last Fill Date/Day Supply: 07-11-21 / 30  Copay $4  Was previous dose already scheduled to fill: No    Notes to Pharmacist:
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
## 2021-08-08 ENCOUNTER — Other Ambulatory Visit: Admit: 2021-08-08 | Discharge: 2021-08-08 | Payer: MEDICAID

## 2021-08-08 ENCOUNTER — Ambulatory Visit: Admit: 2021-08-08 | Discharge: 2021-08-08 | Payer: MEDICAID

## 2021-08-08 ENCOUNTER — Ambulatory Visit
Admit: 2021-08-08 | Discharge: 2021-08-08 | Payer: MEDICAID | Attending: Hematology & Oncology | Primary: Hematology & Oncology

## 2021-08-08 DIAGNOSIS — C9210 Chronic myeloid leukemia, BCR/ABL-positive, not having achieved remission: Principal | ICD-10-CM

## 2021-08-08 LAB — CBC W/ AUTO DIFF
BASOPHILS ABSOLUTE COUNT: 0.1 10*9/L (ref 0.0–0.1)
BASOPHILS RELATIVE PERCENT: 0.5 %
EOSINOPHILS ABSOLUTE COUNT: 0.3 10*9/L (ref 0.0–0.5)
EOSINOPHILS RELATIVE PERCENT: 3.3 %
HEMATOCRIT: 39 % (ref 34.0–44.0)
HEMOGLOBIN: 12.8 g/dL (ref 11.3–14.9)
LYMPHOCYTES ABSOLUTE COUNT: 3.9 10*9/L — ABNORMAL HIGH (ref 1.1–3.6)
LYMPHOCYTES RELATIVE PERCENT: 42 %
MEAN CORPUSCULAR HEMOGLOBIN CONC: 32.8 g/dL (ref 32.0–36.0)
MEAN CORPUSCULAR HEMOGLOBIN: 26.9 pg (ref 25.9–32.4)
MEAN CORPUSCULAR VOLUME: 82.2 fL (ref 77.6–95.7)
MEAN PLATELET VOLUME: 9 fL (ref 6.8–10.7)
MONOCYTES ABSOLUTE COUNT: 0.5 10*9/L (ref 0.3–0.8)
MONOCYTES RELATIVE PERCENT: 5 %
NEUTROPHILS ABSOLUTE COUNT: 4.5 10*9/L (ref 1.8–7.8)
NEUTROPHILS RELATIVE PERCENT: 49.2 %
PLATELET COUNT: 208 10*9/L (ref 150–450)
RED BLOOD CELL COUNT: 4.75 10*12/L (ref 3.95–5.13)
RED CELL DISTRIBUTION WIDTH: 16.1 % — ABNORMAL HIGH (ref 12.2–15.2)
WBC ADJUSTED: 9.2 10*9/L (ref 3.6–11.2)

## 2021-08-08 LAB — COMPREHENSIVE METABOLIC PANEL
ALBUMIN: 4.1 g/dL (ref 3.4–5.0)
ALKALINE PHOSPHATASE: 153 U/L — ABNORMAL HIGH (ref 46–116)
ALT (SGPT): 12 U/L (ref 10–49)
ANION GAP: 10 mmol/L (ref 5–14)
AST (SGOT): 17 U/L (ref <=34)
BILIRUBIN TOTAL: 0.3 mg/dL (ref 0.3–1.2)
BLOOD UREA NITROGEN: 16 mg/dL (ref 9–23)
BUN / CREAT RATIO: 16
CALCIUM: 9.7 mg/dL (ref 8.7–10.4)
CHLORIDE: 100 mmol/L (ref 98–107)
CO2: 21 mmol/L (ref 20.0–31.0)
CREATININE: 0.97 mg/dL — ABNORMAL HIGH
EGFR CKD-EPI (2021) FEMALE: 80 mL/min/{1.73_m2} (ref >=60)
GLUCOSE RANDOM: 431 mg/dL (ref 70–179)
POTASSIUM: 4.5 mmol/L (ref 3.4–4.8)
PROTEIN TOTAL: 7.5 g/dL (ref 5.7–8.2)
SODIUM: 131 mmol/L — ABNORMAL LOW (ref 135–145)

## 2021-08-08 NOTE — Unmapped (Addendum)
Increase bosutinib to 500mg  daily.  You can get your dental work done.  You will return to clinic in 2 weeks for follow up with our nurse practitioner and pharmacist team.    If you have questions or concerns at night or on the weekend, call the hospital operator at [phone] and ask to speak to the hematology/oncology fellow on call.     If you have questions or concerns on a week day during business hours, you may call the Nurse call line at [phone].    Lab on 08/08/2021   Component Date Value Ref Range Status    Sodium 08/08/2021 131 (A) 135 - 145 mmol/L Final    Potassium 08/08/2021 4.5  3.4 - 4.8 mmol/L Final    Chloride 08/08/2021 100  98 - 107 mmol/L Final    CO2 08/08/2021 21.0  20.0 - 31.0 mmol/L Final    Anion Gap 08/08/2021 10  5 - 14 mmol/L Final    BUN 08/08/2021 16  9 - 23 mg/dL Final    Creatinine 29/56/2130 0.97 (A) 0.60 - 0.80 mg/dL Final    BUN/Creatinine Ratio 08/08/2021 16   Final    eGFR CKD-EPI (2021) Female 08/08/2021 80  >=60 mL/min/1.23m2 Final    eGFR calculated with CKD-EPI 2021 equation in accordance with [employer] and [employer] of Nephrology Task Force recommendations.    Glucose 08/08/2021 431 (A) 70 - 179 mg/dL Final    Calcium 86/57/8469 9.7  8.7 - 10.4 mg/dL Final    Albumin 62/95/2841 4.1  3.4 - 5.0 g/dL Final    Total Protein 08/08/2021 7.5  5.7 - 8.2 g/dL Final    Total Bilirubin 08/08/2021 0.3  0.3 - 1.2 mg/dL Final    AST 32/44/0102 17  <=34 U/L Final    ALT 08/08/2021 12  10 - 49 U/L Final    Alkaline Phosphatase 08/08/2021 153 (A) 46 - 116 U/L Final    WBC 08/08/2021 9.2  3.6 - 11.2 10*9/L Final    RBC 08/08/2021 4.75  3.95 - 5.13 10*12/L Final    HGB 08/08/2021 12.8  11.3 - 14.9 g/dL Final    HCT 72/53/6644 39.0  34.0 - 44.0 % Final    MCV 08/08/2021 82.2  77.6 - 95.7 fL Final    MCH 08/08/2021 26.9  25.9 - 32.4 pg Final    MCHC 08/08/2021 32.8  32.0 - 36.0 g/dL Final    RDW 03/47/4259 16.1 (A) 12.2 - 15.2 % Final    MPV 08/08/2021 9.0  6.8 - 10.7 fL Final    Platelet 08/08/2021 208  150 - 450 10*9/L Final    Neutrophils % 08/08/2021 49.2  % Final    Lymphocytes % 08/08/2021 42.0  % Final    Monocytes % 08/08/2021 5.0  % Final    Eosinophils % 08/08/2021 3.3  % Final    Basophils % 08/08/2021 0.5  % Final    Absolute Neutrophils 08/08/2021 4.5  1.8 - 7.8 10*9/L Final    Absolute Lymphocytes 08/08/2021 3.9 (A) 1.1 - 3.6 10*9/L Final    Absolute Monocytes 08/08/2021 0.5  0.3 - 0.8 10*9/L Final    Absolute Eosinophils 08/08/2021 0.3  0.0 - 0.5 10*9/L Final    Absolute Basophils 08/08/2021 0.1  0.0 - 0.1 10*9/L Final    Anisocytosis 08/08/2021 Slight (A) Not Present Final    Hypochromasia 08/08/2021 Slight (A) Not Present Final

## 2021-08-09 NOTE — Unmapped (Signed)
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   Medication Access: $4/30 days     Interval History: Lanisa presents for further follow-up on bosutinib and related symptoms. When last seen she was struggling with nausea, mouth sores, fatigue, headaches. She has a complex medical history including most notably her uncontrolled diabetes. Her symptoms today are much improved since last visit. She main complaint is fatigue, saying she'll need a full day of sleeping and then feel better for subsequent days. Nausea is improved/controlled with ginger chews since we're unable to add traditional antiemetics to her regimen due to previous intolerances and DDI with anti-psychotics. HA's are improved. Denies diarrhea, has constipation but with her hx of IBS and severe diarrhea she is pleased to be constipated over having diarrhea.    Labs: WBC 9.2; ANC 4.5; Hgb 12.8; PLT 208; CMP notable for stable SCr at 0.97, but a significantly elevated BG of 0.97  BCR-ABL from 8/9 35% (unchanged from dx), prompting switch at that time from imatinib to bosutinib    Oncologic History:  Oncology History Overview Note   Diagnosis: [name]    Bone Marrow Biopsy:  Bone marrow, right iliac, aspiration and biopsy (02/28/21)  -  Hypercellular bone marrow (>95%) involved by chronic myeloid leukemia, BCR/ABL-1-positive, chronic phase (1% blasts by manual aspirate differential)  -  No significant marrow fibrosis    Cytogenetics: Abnormal Karyotype. 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 Initial Diagnosis    [name] (chronic myelocytic leukemia) (CMS-HCC)     04/23/2021 Endocrine/Hormone Therapy    OP LEUPROLIDE (LUPRON) 11.25 MG EVERY 3 MONTHS  Plan Provider: Pernell Dupre, MD         Weight and Vitals:  Wt Readings from Last 3 Encounters:   08/08/21 (!) 149.3 kg (329 lb 1.6 oz)   07/25/21 (!) 150.6 kg (332 lb)   07/25/21 (!) 148.1 kg (326 lb 8 oz)     Temp Readings from Last 3 Encounters:   08/08/21 36.3 ??C (97.3 ??F) (Temporal)   07/25/21 36.8 ??C (98.3 ??F) (Oral)   07/09/21 36.6 ??C (97.9 ??F) (Temporal)     BP Readings from Last 3 Encounters:   08/08/21 114/72   07/25/21 117/63   07/25/21 116/54     Pulse Readings from Last 3 Encounters:   08/08/21 79   07/25/21 82   07/25/21 77       Pertinent Labs:  Lab on 08/08/2021   Component Date Value Ref Range Status   ??? Sodium 08/08/2021 131 (A) 135 - 145 mmol/L Final   ??? Potassium 08/08/2021 4.5  3.4 - 4.8 mmol/L Final   ??? Chloride 08/08/2021 100  98 - 107 mmol/L Final   ??? CO2 08/08/2021 21.0  20.0 - 31.0 mmol/L Final   ??? Anion Gap 08/08/2021 10  5 - 14 mmol/L Final   ??? BUN 08/08/2021 16  9 - 23 mg/dL Final   ??? Creatinine 08/08/2021 0.97 (A) 0.60 - 0.80 mg/dL Final   ??? BUN/Creatinine Ratio 08/08/2021  16   Final   ??? eGFR CKD-EPI (2021) Female 08/08/2021 80  >=60 mL/min/1.36m2 Final    eGFR calculated with CKD-EPI 2021 equation in accordance with [employer] and [employer] of Nephrology Task Force recommendations.   ??? Glucose 08/08/2021 431 (A) 70 - 179 mg/dL Final   ??? Calcium 16/08/9603 9.7  8.7 - 10.4 mg/dL Final   ??? Albumin 54/07/8118 4.1  3.4 - 5.0 g/dL Final   ??? Total Protein 08/08/2021 7.5  5.7 - 8.2 g/dL Final   ??? Total Bilirubin 08/08/2021 0.3  0.3 - 1.2 mg/dL Final   ??? AST 14/78/2956 17  <=34 U/L Final   ??? ALT 08/08/2021 12  10 - 49 U/L Final   ??? Alkaline Phosphatase 08/08/2021 153 (A) 46 - 116 U/L Final   ??? WBC 08/08/2021 9.2  3.6 - 11.2 10*9/L Final   ??? RBC 08/08/2021 4.75  3.95 - 5.13 10*12/L Final   ??? HGB 08/08/2021 12.8  11.3 - 14.9 g/dL Final   ??? HCT 21/30/8657 39.0  34.0 - 44.0 % Final   ??? MCV 08/08/2021 82.2  77.6 - 95.7 fL Final   ??? MCH 08/08/2021 26.9  25.9 - 32.4 pg Final   ??? MCHC 08/08/2021 32.8  32.0 - 36.0 g/dL Final   ??? RDW 84/69/6295 16.1 (A) 12.2 - 15.2 % Final   ??? MPV 08/08/2021 9.0  6.8 - 10.7 fL Final   ??? Platelet 08/08/2021 208  150 - 450 10*9/L Final   ??? Neutrophils % 08/08/2021 49.2  % Final   ??? Lymphocytes % 08/08/2021 42.0  % Final   ??? Monocytes % 08/08/2021 5.0  % Final   ??? Eosinophils % 08/08/2021 3.3  % Final   ??? Basophils % 08/08/2021 0.5  % Final   ??? Absolute Neutrophils 08/08/2021 4.5  1.8 - 7.8 10*9/L Final   ??? Absolute Lymphocytes 08/08/2021 3.9 (A) 1.1 - 3.6 10*9/L Final   ??? Absolute Monocytes 08/08/2021 0.5  0.3 - 0.8 10*9/L Final   ??? Absolute Eosinophils 08/08/2021 0.3  0.0 - 0.5 10*9/L Final   ??? Absolute Basophils 08/08/2021 0.1  0.0 - 0.1 10*9/L Final   ??? Anisocytosis 08/08/2021 Slight (A) Not Present Final   ??? Hypochromasia 08/08/2021 Slight (A) Not Present Final       Allergies:   Allergies   Allergen Reactions   ??? Lisinopril Shortness Of Breath   ??? Naproxen Nausea Only, Palpitations and Other (See Comments)     Chest palpitations and feels like flying   ??? Aripiprazole Other (See Comments)     Inability to urinate     ??? Fluphenazine      mental health problems   ??? Lactase      Other reaction(s): Unknown   ??? Metoclopramide      Mania   ??? Prednisone Other (See Comments)     mania   ??? Reglan [Metoclopramide Hcl] Other (See Comments)     Induces mania   ??? Diphenhydramine Hcl Anxiety   ??? Multihance [Gadobenate Dimeglumine] Nausea And Vomiting   ??? Ondansetron Hcl Anxiety   ??? Promethazine Anxiety     Drug Interactions: None identified; CTM medication profile for addition of QTc prolongers/CYP3A4 inhibitors    Current Medications:  Current Outpatient Medications   Medication Sig Dispense Refill   ??? acetone, urine, test (KETONE URINE TEST) Strp Dispense 100.  Use as needed. 100 strip 2 ??? albuterol HFA 90 mcg/actuation inhaler Inhale 2 puffs every six (6)  hours as needed. 8 g 11   ??? atenoloL (TENORMIN) 100 MG tablet Take 1 tablet (100 mg total) by mouth in the morning. 90 tablet 3   ??? blood sugar diagnostic (ACCU-CHEK GUIDE TEST STRIPS) Strp Check sugars before meals three times for insulin dependent type two diabetes. 100 each 11   ??? blood-glucose meter kit Use as instructed - pt prefers a larger monitor glucometer is available 1 each 0   ??? bosutinib 500 mg Tab Take 1 tablet (500 mg total) by mouth daily. 30 tablet 5   ??? calcium carbonate/vitamin D3 (CALTRATE 600 + D ORAL) Take 1 Dose by mouth.     ??? cetirizine (ZYRTEC) 10 MG tablet Take 1 tablet (10 mg total) by mouth in the morning. 90 tablet 3   ??? divalproex ER (DEPAKOTE ER) 500 MG extended released 24 hr tablet TAKE 1 TABLET BY MOUTH AT BEDTIME 90 tablet 3   ??? ferrous sulfate 325 (65 FE) MG tablet Take 1 tablet by mouth daily.     ??? flash glucose sensor (FLASH GLUCOSE SENSOR) kit by Other route every fourteen (14) days. 1 each 11   ??? furosemide (LASIX) 40 MG tablet Take 1 tablet (40 mg total) by mouth every other day. 45 tablet 3   ??? hydroCHLOROthiazide (HYDRODIURIL) 25 MG tablet Take 1 tablet (25 mg total) by mouth in the morning. 90 tablet 3   ??? hydrOXYzine (VISTARIL) 50 MG capsule TAKE 1 CAPSULE BY MOUTH AT BEDTIME AS NEEDED TO IMPROVE SLEEP 90 capsule 3   ??? insulin degludec (TRESIBA FLEXTOUCH U-200) 200 unit/mL (3 mL) InPn Use daily as directed by MD, up to 120 units SQ daily (Patient taking differently: Inject 180 Units under the skin daily.) 54 mL 3   ??? lamoTRIgine (LAMICTAL) 200 MG tablet TAKE 1 TABLET BY MOUTH TWICE DAILY 180 tablet 3   ??? lancets (ACCU-CHEK SOFTCLIX LANCETS) Misc Check sugar three times per day before meals for insulin dependent type two diabetes.  E11.65 100 each 11   ??? liraglutide (VICTOZA) injection pen Inject 0.3 mL (1.8 mg total) under the skin daily. 27 mL 12   ??? metFORMIN (GLUCOPHAGE) 1000 MG tablet Take 1 tablet (1,000 mg total) by mouth in the morning and 1 tablet (1,000 mg total) in the evening. Take with meals. 180 tablet 3   ??? norethindrone (AYGESTIN) 5 mg tablet Take 1 tablet (5 mg total) by mouth in the morning. 30 tablet 11   ??? pantoprazole (PROTONIX) 40 MG tablet Take 1 tablet (40 mg total) by mouth in the morning. 90 tablet 3   ??? pen needle, diabetic (NOVOFINE 32) 32 gauge x 1/4 (6 mm) Ndle 1 each by Miscellaneous route daily. 100 each 11   ??? pregabalin (LYRICA) 75 MG capsule TAKE 1 CAPSULE BY MOUTH EVERY MORNING and 2 EVERY EVENING 270 capsule 3   ??? traZODone (DESYREL) 100 MG tablet TAKE 2 TABLETS BY MOUTH AT BEDTIME 180 tablet 3   ??? valsartan (DIOVAN) 160 MG tablet Take 1 tablet (160 mg total) by mouth daily. 90 tablet 3   ??? ziprasidone (GEODON) 80 MG capsule Take 80 mg by mouth 2 (two) times a day with meals.      ??? insulin lispro (HUMALOG KWIKPEN INSULIN) 100 unit/mL injection pen Check blood sugars before meals and bedtime.  Take sliding scale insulin as directed: for blood sugars < 150 = no insulin; blood sugars 151-200 = take 2 units; blood sugars 201-250 = 4 units; blood sugars  251-300 = 6 units; blood sugars 301-350 = 8 units; blood sugars 351-400 = take 10 units; blood sugars > 400 = take 12 units and recheck blood sugar in 1 hour and reapply sliding scale insulin (max dose insulin = 36 units).  If blood sugars remain elevated > 400 after taking max dose of insulin, go to nearest ER to be evaluated. 3 mL 12     Current Facility-Administered Medications   Medication Dose Route Frequency Provider Last Rate Last Admin   ??? influenza vaccine quad (FLUARIX, FLULAVAL, FLUZONE) (6 MOS & UP) 2022-23 ADS Med                Adherence: No barriers identified; reports no missed doses of bosutinib    Assessment: Ms.Schweickert is a 30 y.o. 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 Rx being delivered from [facility]  - RTC in 2 weeks for labs/CPP check in  - Repeat BCR-ABL due 09/2021    2) Menstrual suppression/ contraception  - Lupron dose due today 07/25/21, then next 10/17/21  - Continue Aygestin for bone support with longterm lupron use (chosen to aid in fertility prolongation). Continue Ca/VitD    3) Headaches  -Continue managing HA's at home, ibuprofen previously recommended and ok to continue if using  -Note intolerance to naproxen (nausea, palpitations); also on divalproex, lamotrigine, ziprasidone, pregabalin, trazodone, hydroxyzine - will need to screen for interactions prior to initiation of new therapy for headaches     4) Nausea  -Continue ginger chews PRN  -Note intolerances to zofran, promethazine, and metoclopramide (anxiety/mania)    5) DM  - Noted high BG. Actively following with endocrinology and they are making changes to regimen currently. Discussed concern for high BG, although she describes this as consistent with her home readings She should go to ED if she develops symptoms of ketosis (has urine test strips at home)    F/u:  Future Appointments   Date Time Provider Department Center   09/04/2021 11:30 AM Hilda Blades, MD UNCPULSPCLET [facility]   09/12/2021  2:00 PM Mercie Eon, MD OPHTHTNELS [facility]   09/26/2021  3:20 PM Murriel Hopper Byram, ANP UNCGIMEDET [facility]   10/15/2021  2:50 PM Fredricka Bonine, MD UNCDIABENDET [facility]   11/26/2021  2:40 PM Nurum Joeseph Amor, MD INTMEDWC [facility]   01/14/2022  4:00 PM Tarri Abernethy, FNP UNCDIABENDET [facility]       I spent 20 minutes with Jenna Mosley in direct patient care.      Manfred Arch, PharmD, BCOP, CPP  Pager: [phone]

## 2021-08-09 NOTE — Unmapped (Signed)
[facility] Clinical Assessment & Refill Coordination Note    Jenna Mosley, DOB: 08-06-1991  Phone: [phone] (home)     All above HIPAA information was verified with patient.     Was a Nurse, learning disability used for this call? No    Specialty Medication(s):   Hematology/Oncology: Bosulif     Current Outpatient Medications   Medication Sig Dispense Refill   ??? acetone, urine, test (KETONE URINE TEST) Strp Dispense 100.  Use as needed. 100 strip 2   ??? albuterol HFA 90 mcg/actuation inhaler Inhale 2 puffs every six (6) hours as needed. 8 g 11   ??? atenoloL (TENORMIN) 100 MG tablet Take 1 tablet (100 mg total) by mouth in the morning. 90 tablet 3   ??? blood sugar diagnostic (ACCU-CHEK GUIDE TEST STRIPS) Strp Check sugars before meals three times for insulin dependent type two diabetes. 100 each 11   ??? blood-glucose meter kit Use as instructed - pt prefers a larger monitor glucometer is available 1 each 0   ??? bosutinib 500 mg Tab Take 1 tablet (500 mg total) by mouth daily. 30 tablet 5   ??? calcium carbonate/vitamin D3 (CALTRATE 600 + D ORAL) Take 1 Dose by mouth.     ??? cetirizine (ZYRTEC) 10 MG tablet Take 1 tablet (10 mg total) by mouth in the morning. 90 tablet 3   ??? divalproex ER (DEPAKOTE ER) 500 MG extended released 24 hr tablet TAKE 1 TABLET BY MOUTH AT BEDTIME 90 tablet 3   ??? ferrous sulfate 325 (65 FE) MG tablet Take 1 tablet by mouth daily.     ??? flash glucose sensor (FLASH GLUCOSE SENSOR) kit by Other route every fourteen (14) days. 1 each 11   ??? furosemide (LASIX) 40 MG tablet Take 1 tablet (40 mg total) by mouth every other day. 45 tablet 3   ??? hydroCHLOROthiazide (HYDRODIURIL) 25 MG tablet Take 1 tablet (25 mg total) by mouth in the morning. 90 tablet 3   ??? hydrOXYzine (VISTARIL) 50 MG capsule TAKE 1 CAPSULE BY MOUTH AT BEDTIME AS NEEDED TO IMPROVE SLEEP 90 capsule 3   ??? insulin degludec (TRESIBA FLEXTOUCH U-200) 200 unit/mL (3 mL) InPn Use daily as directed by MD, up to 120 units SQ daily (Patient taking differently: Inject 180 Units under the skin daily.) 54 mL 3   ??? insulin lispro (HUMALOG KWIKPEN INSULIN) 100 unit/mL injection pen Check blood sugars before meals and bedtime.  Take sliding scale insulin as directed: for blood sugars < 150 = no insulin; blood sugars 151-200 = take 2 units; blood sugars 201-250 = 4 units; blood sugars 251-300 = 6 units; blood sugars 301-350 = 8 units; blood sugars 351-400 = take 10 units; blood sugars > 400 = take 12 units and recheck blood sugar in 1 hour and reapply sliding scale insulin (max dose insulin = 36 units).  If blood sugars remain elevated > 400 after taking max dose of insulin, go to nearest ER to be evaluated. 3 mL 12   ??? lamoTRIgine (LAMICTAL) 200 MG tablet TAKE 1 TABLET BY MOUTH TWICE DAILY 180 tablet 3   ??? lancets (ACCU-CHEK SOFTCLIX LANCETS) Misc Check sugar three times per day before meals for insulin dependent type two diabetes.  E11.65 100 each 11   ??? liraglutide (VICTOZA) injection pen Inject 0.3 mL (1.8 mg total) under the skin daily. 27 mL 12   ??? metFORMIN (GLUCOPHAGE) 1000 MG tablet Take 1 tablet (1,000 mg total) by  mouth in the morning and 1 tablet (1,000 mg total) in the evening. Take with meals. 180 tablet 3   ??? norethindrone (AYGESTIN) 5 mg tablet Take 1 tablet (5 mg total) by mouth in the morning. 30 tablet 11   ??? pantoprazole (PROTONIX) 40 MG tablet Take 1 tablet (40 mg total) by mouth in the morning. 90 tablet 3   ??? pen needle, diabetic (NOVOFINE 32) 32 gauge x 1/4 (6 mm) Ndle 1 each by Miscellaneous route daily. 100 each 11   ??? pregabalin (LYRICA) 75 MG capsule TAKE 1 CAPSULE BY MOUTH EVERY MORNING and 2 EVERY EVENING 270 capsule 3   ??? traZODone (DESYREL) 100 MG tablet TAKE 2 TABLETS BY MOUTH AT BEDTIME 180 tablet 3   ??? valsartan (DIOVAN) 160 MG tablet Take 1 tablet (160 mg total) by mouth daily. 90 tablet 3   ??? ziprasidone (GEODON) 80 MG capsule Take 80 mg by mouth 2 (two) times a day with meals.        No current facility-administered medications for this visit.     Facility-Administered Medications Ordered in Other Visits   Medication Dose Route Frequency Provider Last Rate Last Admin   ??? influenza vaccine quad (FLUARIX, FLULAVAL, FLUZONE) (6 MOS & UP) 2022-23 ADS Med                 Changes to medications: [name] reports no changes at this time.    Allergies   Allergen Reactions   ??? Lisinopril Shortness Of Breath   ??? Naproxen Nausea Only, Palpitations and Other (See Comments)     Chest palpitations and feels like flying   ??? Aripiprazole Other (See Comments)     Inability to urinate     ??? Fluphenazine      mental health problems   ??? Lactase      Other reaction(s): Unknown   ??? Metoclopramide      Mania   ??? Prednisone Other (See Comments)     mania   ??? Reglan [Metoclopramide Hcl] Other (See Comments)     Induces mania   ??? Diphenhydramine Hcl Anxiety   ??? Multihance [Gadobenate Dimeglumine] Nausea And Vomiting   ??? Ondansetron Hcl Anxiety   ??? Promethazine Anxiety       Changes to allergies: No    SPECIALTY MEDICATION ADHERENCE     Bosutinib 500 mg: 0 days of medicine on hand   bosutinib 400 mg: 3 days of medicine on hand (runs out on Sunday 08/11/21)    Medication Adherence    Patient reported X missed doses in the last month: 0  Specialty Medication: Bosulif 500 mg daily  Patient is on additional specialty medications: No  Informant: patient  Confirmed plan for next specialty medication refill: delivery by pharmacy  Refills needed for supportive medications: not needed          Specialty medication(s) dose(s) confirmed: Dose increased to bosutinib 500 mg once daily     Are there any concerns with adherence? No    Adherence counseling provided? Not needed    CLINICAL MANAGEMENT AND INTERVENTION      Clinical Benefit Assessment:    Do you feel the medicine is effective or helping your condition? Yes    Clinical Benefit counseling provided? Not needed    Adverse Effects Assessment:    Are you experiencing any side effects? No    Are you experiencing difficulty administering your medicine? No    Quality of Life Assessment:    Quality  of Life    Rheumatology  Oncology  1. What impact has your specialty medication had on the reduction of your daily pain or discomfort level?: Tremendous  2. On a scale of 1-10, how would you rate your ability to manage side effects associated with your specialty medication? (1=no issues, 10 = unable to take medication due to side effects): 2  Dermatology  Cystic Fibrosis          How many days over the past month did your [name] - chronic myelocytic leukemia  keep you from your normal activities? For example, brushing your teeth or getting up in the morning. 0    Have you discussed this with your provider? Not needed    Acute Infection Status:    Acute infections noted within Epic:  No active infections  Patient reported infection: None    Therapy Appropriateness:    Is therapy appropriate and patient progressing towards therapeutic goals? Yes, therapy is appropriate and should be continued    DISEASE/MEDICATION-SPECIFIC INFORMATION      N/A    PATIENT SPECIFIC NEEDS     - Does the patient have any physical, cognitive, or cultural barriers? No    - Is the patient high risk? Yes, patient is taking oral chemotherapy. Appropriateness of therapy as been assessed    - Does the patient require a Care Management Plan? No     - Does the patient require physician intervention or other additional services (i.e. nutrition, smoking cessation, social work)? No      SHIPPING     Specialty Medication(s) to be Shipped:   Hematology/Oncology: Bosulif    Other medication(s) to be shipped: No additional medications requested for fill at this time     Changes to insurance: No    Delivery Scheduled: Yes, Expected medication delivery date: 08/09/21.     Medication will be delivered via Same Day Courier to the confirmed prescription address in [facility].    The patient will receive a drug information handout for each medication shipped and additional FDA Medication Guides as required.  Verified that patient has previously received a Conservation officer, historic buildings and a Surveyor, mining.    The patient or caregiver noted above participated in the development of this care plan and knows that they can request review of or adjustments to the care plan at any time.      All of the patient's questions and concerns have been addressed.    Breck Coons [facility] Specialty Pharmacist

## 2021-08-12 NOTE — Unmapped (Signed)
Hi,     Denise contacted the [employer] regarding the following:    States that when leaving from appt on 09/22 was told to schedule a 2 week return appt with labs.    Please contact Denise at [phone].    Thanks in advance,    Iona Hansen  [facility]   [phone]

## 2021-08-12 NOTE — Unmapped (Signed)
Spoke with Angelique Blonder about scheduling a follow up for 10/6

## 2021-08-22 ENCOUNTER — Ambulatory Visit: Admit: 2021-08-22 | Discharge: 2021-08-22 | Payer: MEDICAID

## 2021-08-22 ENCOUNTER — Other Ambulatory Visit: Admit: 2021-08-22 | Discharge: 2021-08-22 | Payer: MEDICAID

## 2021-08-22 DIAGNOSIS — C9210 Chronic myeloid leukemia, BCR/ABL-positive, not having achieved remission: Principal | ICD-10-CM

## 2021-08-22 LAB — COMPREHENSIVE METABOLIC PANEL
ALBUMIN: 3.9 g/dL (ref 3.4–5.0)
ALKALINE PHOSPHATASE: 167 U/L — ABNORMAL HIGH (ref 46–116)
ALT (SGPT): 13 U/L (ref 10–49)
ANION GAP: 9 mmol/L (ref 5–14)
AST (SGOT): 18 U/L (ref <=34)
BILIRUBIN TOTAL: 0.3 mg/dL (ref 0.3–1.2)
BLOOD UREA NITROGEN: 17 mg/dL (ref 9–23)
BUN / CREAT RATIO: 17
CALCIUM: 9.8 mg/dL (ref 8.7–10.4)
CHLORIDE: 100 mmol/L (ref 98–107)
CO2: 24 mmol/L (ref 20.0–31.0)
CREATININE: 1.01 mg/dL — ABNORMAL HIGH
EGFR CKD-EPI (2021) FEMALE: 76 mL/min/{1.73_m2} (ref >=60)
GLUCOSE RANDOM: 402 mg/dL (ref 70–179)
POTASSIUM: 4.3 mmol/L (ref 3.4–4.8)
PROTEIN TOTAL: 7.2 g/dL (ref 5.7–8.2)
SODIUM: 133 mmol/L — ABNORMAL LOW (ref 135–145)

## 2021-08-22 LAB — CBC W/ AUTO DIFF
BASOPHILS ABSOLUTE COUNT: 0.1 10*9/L (ref 0.0–0.1)
BASOPHILS RELATIVE PERCENT: 1.4 %
EOSINOPHILS ABSOLUTE COUNT: 0.2 10*9/L (ref 0.0–0.5)
EOSINOPHILS RELATIVE PERCENT: 2.3 %
HEMATOCRIT: 39.6 % (ref 34.0–44.0)
HEMOGLOBIN: 13.1 g/dL (ref 11.3–14.9)
LYMPHOCYTES ABSOLUTE COUNT: 4 10*9/L — ABNORMAL HIGH (ref 1.1–3.6)
LYMPHOCYTES RELATIVE PERCENT: 47.8 %
MEAN CORPUSCULAR HEMOGLOBIN CONC: 33.1 g/dL (ref 32.0–36.0)
MEAN CORPUSCULAR HEMOGLOBIN: 27.4 pg (ref 25.9–32.4)
MEAN CORPUSCULAR VOLUME: 82.8 fL (ref 77.6–95.7)
MEAN PLATELET VOLUME: 8.8 fL (ref 6.8–10.7)
MONOCYTES ABSOLUTE COUNT: 0.4 10*9/L (ref 0.3–0.8)
MONOCYTES RELATIVE PERCENT: 4.8 %
NEUTROPHILS ABSOLUTE COUNT: 3.6 10*9/L (ref 1.8–7.8)
NEUTROPHILS RELATIVE PERCENT: 43.7 %
PLATELET COUNT: 234 10*9/L (ref 150–450)
RED BLOOD CELL COUNT: 4.79 10*12/L (ref 3.95–5.13)
RED CELL DISTRIBUTION WIDTH: 16.1 % — ABNORMAL HIGH (ref 12.2–15.2)
WBC ADJUSTED: 8.3 10*9/L (ref 3.6–11.2)

## 2021-08-23 NOTE — Unmapped (Signed)
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     For oral chemotherapy: bosutinib  Pharmacy: [facility]   Medication Access: $4/30 days     Interval History: Jenna Mosley presents for further follow-up on bosutinib and related symptoms. At last visit, bosutinib dose was increased to 500 mg daily dose from 400 mg daily dose due to improved tolerance from when she started. Today she reports she is feeling much better. Energy is better, appetite is better. Only having occasional headaches but nowhere near like she was having before when she started bosutinib. She feels like she is awake throughout the day and able to stay active without needing to nap or rest. When she first started higher dose of bosutinib she did notice a few more symptoms (fatigue and headaches) but this only lasted 3-4 days and now she is feeling good about two weeks into higher dose. Some occasional nausea but using ginger chews. No diarrhea or constipation currently. No SOB or dizziness. No new rash or skin changes though she did have hives (thinks it was stress-related) that resolved a few days ago. No new pain. Reports sugars have been better at home.    Labs: WBC 8.3; ANC 3.6; Hgb 13.1; PLT 234; CMP notable for stable SCr at 1.01, but a significantly elevated BG of 402    Oncologic History:  Oncology History Overview Note   Diagnosis: [name]    Bone Marrow Biopsy:  Bone marrow, right iliac, aspiration and biopsy (02/28/21)  -  Hypercellular bone marrow (>95%) involved by chronic myeloid leukemia, BCR/ABL-1-positive, chronic phase (1% blasts by manual aspirate differential)  -  No significant marrow fibrosis    Cytogenetics: Abnormal Karyotype. 46,XX,t(9;22)(q34;q11.2)[20]    Treatment: imatinib  - 03/27/21    BCR/ABL p210 06/25/21: 34.403%  BCR-ABL1 Mutation Analysis: Negative    Switched to bosutinib on 07/12/21    Bosutinib 500 mg daily- 08/09/2021     [name] (chronic myelocytic leukemia) (CMS-HCC)   02/19/2021 Initial Diagnosis    [name] (chronic myelocytic leukemia) (CMS-HCC)     04/23/2021 Endocrine/Hormone Therapy    OP LEUPROLIDE (LUPRON) 11.25 MG EVERY 3 MONTHS  Plan Provider: Pernell Dupre, MD         Weight and Vitals:  Wt Readings from Last 3 Encounters:   08/22/21 (!) 150 kg (330 lb 12.8 oz)   08/08/21 (!) 149.3 kg (329 lb 1.6 oz)   07/25/21 (!) 150.6 kg (332 lb)     Temp Readings from Last 3 Encounters:   08/22/21 37 ??C (98.6 ??F) (Temporal)   08/08/21 36.3 ??C (97.3 ??F) (Temporal)   07/25/21 36.8 ??C (98.3 ??F) (Oral)     BP Readings from Last 3 Encounters:   08/22/21 127/72   08/08/21 114/72   07/25/21 117/63     Pulse Readings from Last 3 Encounters:   08/22/21 82   08/08/21 79   07/25/21 82       Pertinent Labs:  Lab on 08/22/2021   Component Date Value Ref Range Status   ??? Sodium 08/22/2021 133 (A) 135 - 145 mmol/L Final   ??? Potassium 08/22/2021 4.3  3.4 - 4.8 mmol/L Final   ??? Chloride 08/22/2021 100  98 - 107 mmol/L Final   ???  CO2 08/22/2021 24.0  20.0 - 31.0 mmol/L Final   ??? Anion Gap 08/22/2021 9  5 - 14 mmol/L Final   ??? BUN 08/22/2021 17  9 - 23 mg/dL Final   ??? Creatinine 08/22/2021 1.01 (A) 0.60 - 0.80 mg/dL Final   ??? BUN/Creatinine Ratio 08/22/2021 17   Final   ??? eGFR CKD-EPI (2021) Female 08/22/2021 76  >=60 mL/min/1.69m2 Final    eGFR calculated with CKD-EPI 2021 equation in accordance with [employer] and [employer] of Nephrology Task Force recommendations.   ??? Glucose 08/22/2021 402 (A) 70 - 179 mg/dL Final   ??? Calcium 29/56/2130 9.8  8.7 - 10.4 mg/dL Final   ??? Albumin 86/57/8469 3.9  3.4 - 5.0 g/dL Final   ??? Total Protein 08/22/2021 7.2  5.7 - 8.2 g/dL Final   ??? Total Bilirubin 08/22/2021 0.3  0.3 - 1.2 mg/dL Final   ??? AST 62/95/2841 18  <=34 U/L Final   ??? ALT 08/22/2021 13  10 - 49 U/L Final   ??? Alkaline Phosphatase 08/22/2021 167 (A) 46 - 116 U/L Final   ??? WBC 08/22/2021 8.3  3.6 - 11.2 10*9/L Final   ??? RBC 08/22/2021 4.79  3.95 - 5.13 10*12/L Final   ??? HGB 08/22/2021 13.1  11.3 - 14.9 g/dL Final   ??? HCT 32/44/0102 39.6  34.0 - 44.0 % Final   ??? MCV 08/22/2021 82.8  77.6 - 95.7 fL Final   ??? MCH 08/22/2021 27.4  25.9 - 32.4 pg Final   ??? MCHC 08/22/2021 33.1  32.0 - 36.0 g/dL Final   ??? RDW 72/53/6644 16.1 (A) 12.2 - 15.2 % Final   ??? MPV 08/22/2021 8.8  6.8 - 10.7 fL Final   ??? Platelet 08/22/2021 234  150 - 450 10*9/L Final   ??? Neutrophils % 08/22/2021 43.7  % Final   ??? Lymphocytes % 08/22/2021 47.8  % Final   ??? Monocytes % 08/22/2021 4.8  % Final   ??? Eosinophils % 08/22/2021 2.3  % Final   ??? Basophils % 08/22/2021 1.4  % Final   ??? Absolute Neutrophils 08/22/2021 3.6  1.8 - 7.8 10*9/L Final   ??? Absolute Lymphocytes 08/22/2021 4.0 (A) 1.1 - 3.6 10*9/L Final   ??? Absolute Monocytes 08/22/2021 0.4  0.3 - 0.8 10*9/L Final   ??? Absolute Eosinophils 08/22/2021 0.2  0.0 - 0.5 10*9/L Final   ??? Absolute Basophils 08/22/2021 0.1  0.0 - 0.1 10*9/L Final   ??? Anisocytosis 08/22/2021 Slight (A) Not Present Final       Allergies:   Allergies   Allergen Reactions   ??? Lisinopril Shortness Of Breath   ??? Naproxen Nausea Only, Palpitations and Other (See Comments)     Chest palpitations and feels like flying   ??? Aripiprazole Other (See Comments)     Inability to urinate     ??? Fluphenazine      mental health problems   ??? Lactase      Other reaction(s): Unknown   ??? Metoclopramide      Mania   ??? Prednisone Other (See Comments)     mania   ??? Reglan [Metoclopramide Hcl] Other (See Comments)     Induces mania   ??? Diphenhydramine Hcl Anxiety   ??? Multihance [Gadobenate Dimeglumine] Nausea And Vomiting   ??? Ondansetron Hcl Anxiety   ??? Promethazine Anxiety     Drug Interactions: None identified; CTM medication profile for addition of QTc prolongers/CYP3A4 inhibitors    Current  Medications:  Current Outpatient Medications Medication Sig Dispense Refill   ??? acetone, urine, test (KETONE URINE TEST) Strp Dispense 100.  Use as needed. 100 strip 2   ??? albuterol HFA 90 mcg/actuation inhaler Inhale 2 puffs every six (6) hours as needed. 8 g 11   ??? atenoloL (TENORMIN) 100 MG tablet Take 1 tablet (100 mg total) by mouth in the morning. 90 tablet 3   ??? blood sugar diagnostic (ACCU-CHEK GUIDE TEST STRIPS) Strp Check sugars before meals three times for insulin dependent type two diabetes. 100 each 11   ??? blood-glucose meter kit Use as instructed - pt prefers a larger monitor glucometer is available 1 each 0   ??? bosutinib 500 mg Tab Take 1 tablet (500 mg total) by mouth daily. 30 tablet 5   ??? calcium carbonate/vitamin D3 (CALTRATE 600 + D ORAL) Take 1 Dose by mouth.     ??? cetirizine (ZYRTEC) 10 MG tablet Take 1 tablet (10 mg total) by mouth in the morning. 90 tablet 3   ??? divalproex ER (DEPAKOTE ER) 500 MG extended released 24 hr tablet TAKE 1 TABLET BY MOUTH AT BEDTIME 90 tablet 3   ??? ferrous sulfate 325 (65 FE) MG tablet Take 1 tablet by mouth daily.     ??? flash glucose sensor (FLASH GLUCOSE SENSOR) kit by Other route every fourteen (14) days. 1 each 11   ??? furosemide (LASIX) 40 MG tablet Take 1 tablet (40 mg total) by mouth every other day. 45 tablet 3   ??? hydroCHLOROthiazide (HYDRODIURIL) 25 MG tablet Take 1 tablet (25 mg total) by mouth in the morning. 90 tablet 3   ??? hydrOXYzine (VISTARIL) 50 MG capsule TAKE 1 CAPSULE BY MOUTH AT BEDTIME AS NEEDED TO IMPROVE SLEEP 90 capsule 3   ??? insulin degludec (TRESIBA FLEXTOUCH U-200) 200 unit/mL (3 mL) InPn Use daily as directed by MD, up to 120 units SQ daily (Patient taking differently: Inject 180 Units under the skin daily.) 54 mL 3   ??? insulin lispro (HUMALOG KWIKPEN INSULIN) 100 unit/mL injection pen Check blood sugars before meals and bedtime.  Take sliding scale insulin as directed: for blood sugars < 150 = no insulin; blood sugars 151-200 = take 2 units; blood sugars 201-250 = 4 units; blood sugars 251-300 = 6 units; blood sugars 301-350 = 8 units; blood sugars 351-400 = take 10 units; blood sugars > 400 = take 12 units and recheck blood sugar in 1 hour and reapply sliding scale insulin (max dose insulin = 36 units).  If blood sugars remain elevated > 400 after taking max dose of insulin, go to nearest ER to be evaluated. 3 mL 12   ??? lamoTRIgine (LAMICTAL) 200 MG tablet TAKE 1 TABLET BY MOUTH TWICE DAILY 180 tablet 3   ??? lancets (ACCU-CHEK SOFTCLIX LANCETS) Misc Check sugar three times per day before meals for insulin dependent type two diabetes.  E11.65 100 each 11   ??? liraglutide (VICTOZA) injection pen Inject 0.3 mL (1.8 mg total) under the skin daily. 27 mL 12   ??? metFORMIN (GLUCOPHAGE) 1000 MG tablet Take 1 tablet (1,000 mg total) by mouth in the morning and 1 tablet (1,000 mg total) in the evening. Take with meals. 180 tablet 3   ??? norethindrone (AYGESTIN) 5 mg tablet Take 1 tablet (5 mg total) by mouth in the morning. 30 tablet 11   ??? pen needle, diabetic (NOVOFINE 32) 32 gauge x 1/4 (6 mm) Ndle 1 each by Miscellaneous route  daily. 100 each 11   ??? pregabalin (LYRICA) 75 MG capsule TAKE 1 CAPSULE BY MOUTH EVERY MORNING and 2 EVERY EVENING 270 capsule 3   ??? traZODone (DESYREL) 100 MG tablet TAKE 2 TABLETS BY MOUTH AT BEDTIME 180 tablet 3   ??? valsartan (DIOVAN) 160 MG tablet Take 1 tablet (160 mg total) by mouth daily. 90 tablet 3   ??? ziprasidone (GEODON) 80 MG capsule Take 80 mg by mouth 2 (two) times a day with meals.        No current facility-administered medications for this visit.       Adherence: No barriers identified; reports no missed doses of bosutinib    Assessment: Jenna Mosley is a 30 y.o. 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 Will bring her back in 4 weeks and repeat BCR-ABL then (though notably that will be ~2 mo after start of bosutinib and 6 weeks into start of bosutinib 500 mg dose).     Plan:   1) [name]  - Continue bosutinib 500 mg daily   - RTC in 4 weeks for labs + APP visit  - Repeat BCR-ABL due 09/2021    2) Menstrual suppression/ contraception  - Lupron dose next due 10/17/21  - Continue Aygestin for bone support with longterm lupron use (chosen to aid in fertility prolongation). Continue Ca/VitD    3) Headaches  -Continue managing HA's at home, ibuprofen previously recommended and ok to continue if using  -Note intolerance to naproxen (nausea, palpitations); also on divalproex, lamotrigine, ziprasidone, pregabalin, trazodone, hydroxyzine - will need to screen for interactions prior to initiation of new therapy for headaches     4) Nausea  -Continue ginger chews PRN  -Note intolerances to zofran, promethazine, and metoclopramide (anxiety/mania)    5) DM  - Noted high BG. Actively following with endocrinology and they are making changes to regimen currently. Discussed concern for high BG, although she describes this as consistent with her home readings She should go to ED if she develops symptoms of ketosis (has urine test strips at home)    F/u:  Future Appointments   Date Time Provider Department Center   09/04/2021 11:30 AM Hilda Blades, MD [facility]ULSPCLET [facility]   09/12/2021  2:00 PM Mercie Eon, MD OPHTHTNELS [facility]   09/26/2021  3:20 PM Murriel Hopper Alda, ANP UNCGIMEDET [facility]   10/15/2021  2:50 PM Fredricka Bonine, MD [facility]CD[facility]END[facility]   11/26/2021  2:40 PM Nurum Joeseph Amor, MD INTMEDWC [facility]   01/14/2022  4:00 PM Tarri Abernethy, FNP [facility]END[facility]       I spent 20 minutes with Jenna Mosley in direct patient care.    Ronnald Collum, PharmD, BCOP, CPP  Hematology/Oncology Clinical Pharmacist  Pager [phone]

## 2021-08-26 NOTE — Unmapped (Signed)
[facility] Refill Coordination Note    Specialty Medication(s) to be Shipped:   Hematology/Oncology: Bosulif    Other medication(s) to be shipped: No additional medications requested for fill at this time     Jenna Mosley, DOB: Oct 09, 1991  Phone: [phone] (home)       All above HIPAA information was verified with patient.     Was a Nurse, learning disability used for this call? No    Completed refill call assessment today to schedule patient's medication shipment from the [facility] [phone]).  All relevant notes have been reviewed.     Specialty medication(s) and dose(s) confirmed: Regimen is correct and unchanged.   Changes to medications: Bettey reports no changes at this time.  Changes to insurance: No  New side effects reported not previously addressed with a pharmacist or physician: None reported  Questions for the pharmacist: No    Confirmed patient received a Conservation officer, historic buildings and a Surveyor, mining with first shipment. The patient will receive a drug information handout for each medication shipped and additional FDA Medication Guides as required.       DISEASE/MEDICATION-SPECIFIC INFORMATION        N/A    SPECIALTY MEDICATION ADHERENCE     Medication Adherence    Patient reported X missed doses in the last month: 2  Specialty Medication: Bosulif 500mg   Patient is on additional specialty medications: No  Informant: patient              Were doses missed due to medication being on hold? No    Bosulif 500 mg: 14 days of medicine on hand       REFERRAL TO PHARMACIST     Referral to the pharmacist: Not needed      [facility]     Shipping address confirmed in Epic.     Delivery Scheduled: Yes, Expected medication delivery date: 09/10/21.     Medication will be delivered via Next Day Courier to the prescription address in Epic [place].    [name] [name]   [facility] Specialty Technician

## 2021-08-27 DIAGNOSIS — Z794 Long term (current) use of insulin: Principal | ICD-10-CM

## 2021-08-27 DIAGNOSIS — E119 Type 2 diabetes mellitus without complications: Principal | ICD-10-CM

## 2021-08-27 MED ORDER — TECHLITE PEN NEEDLE 32 GAUGE X 1/4" (6 MM)
11 refills | 0 days
Start: 2021-08-27 — End: ?

## 2021-09-04 ENCOUNTER — Ambulatory Visit: Admit: 2021-09-04 | Discharge: 2021-09-05 | Payer: MEDICAID

## 2021-09-04 DIAGNOSIS — Z794 Long term (current) use of insulin: Principal | ICD-10-CM

## 2021-09-04 DIAGNOSIS — J984 Other disorders of lung: Principal | ICD-10-CM

## 2021-09-04 DIAGNOSIS — E119 Type 2 diabetes mellitus without complications: Principal | ICD-10-CM

## 2021-09-04 LAB — COMPREHENSIVE METABOLIC PANEL
ALBUMIN: 3.7 g/dL (ref 3.4–5.0)
ALKALINE PHOSPHATASE: 123 U/L — ABNORMAL HIGH (ref 46–116)
ALT (SGPT): 12 U/L (ref 10–49)
ANION GAP: 8 mmol/L (ref 5–14)
AST (SGOT): 19 U/L (ref <=34)
BILIRUBIN TOTAL: 0.3 mg/dL (ref 0.3–1.2)
BLOOD UREA NITROGEN: 12 mg/dL (ref 9–23)
BUN / CREAT RATIO: 14
CALCIUM: 9.9 mg/dL (ref 8.7–10.4)
CHLORIDE: 105 mmol/L (ref 98–107)
CO2: 28 mmol/L (ref 20.0–31.0)
CREATININE: 0.84 mg/dL — ABNORMAL HIGH
EGFR CKD-EPI (2021) FEMALE: >90 mL/min/{1.73_m2} (ref >=60)
GLUCOSE RANDOM: 161 mg/dL — ABNORMAL HIGH (ref 70–99)
POTASSIUM: 4.5 mmol/L (ref 3.4–4.8)
PROTEIN TOTAL: 7.2 g/dL (ref 5.7–8.2)
SODIUM: 141 mmol/L (ref 135–145)

## 2021-09-04 NOTE — Unmapped (Signed)
Please take your medications as prescribed.  Thank you for allowing me to be a part of your care.  Please call the clinic with any questions.    Hilda Blades, MD  Pulmonary and Critical Care Medicine  [address]  [id number]  [place], [place] [address]    Thank you for your visit to the [facility].  You may receive a survey from [facility] regarding your visit today, and we are eager to use this feedback to improve your experience.  Thank you for taking the time to fill it out.    Between appointments, you can reach [place] at these numbers:    For appointments or the Pulmonary Nurse: [phone]  Fax: [phone]    For urgent issues after hours:  Hospital Operator: [phone], ask for Pulmonary Fellow on call
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
## 2021-09-04 NOTE — Unmapped (Signed)
I saw and evaluated the patient, participating in the key portions of the service.  I reviewed the resident???s note.  I agree with the resident???s findings and plan.     Carmelia Roller, MD

## 2021-09-06 NOTE — Unmapped (Signed)
Jenna Mosley 's Bosulif shipment will be sent out  as a result of per pt request     Patient requested and communicated the delivery change. We will reschedule the medication for the delivery date that the patient agreed upon.  We via same day courier. 10/24

## 2021-09-09 MED FILL — BOSULIF 500 MG TABLET: ORAL | 30 days supply | Qty: 30 | Fill #1

## 2021-09-11 DIAGNOSIS — E119 Type 2 diabetes mellitus without complications: Principal | ICD-10-CM

## 2021-09-11 DIAGNOSIS — Z794 Long term (current) use of insulin: Principal | ICD-10-CM

## 2021-09-11 MED ORDER — PEN NEEDLE, DIABETIC 32 GAUGE X 1/4" (6 MM)
Freq: Every day | 4 refills | 0 days | Status: CP
Start: 2021-09-11 — End: 2021-12-10

## 2021-09-11 MED ORDER — INSULIN DEGLUDEC (U-200) 200 UNIT/ML (3 ML) SUBCUTANEOUS PEN
Freq: Every day | SUBCUTANEOUS | 3 refills | 90 days | Status: CP
Start: 2021-09-11 — End: 2021-12-10

## 2021-09-12 ENCOUNTER — Ambulatory Visit: Admit: 2021-09-12 | Discharge: 2021-09-13 | Payer: MEDICAID

## 2021-09-12 DIAGNOSIS — Z794 Long term (current) use of insulin: Principal | ICD-10-CM

## 2021-09-12 DIAGNOSIS — E119 Type 2 diabetes mellitus without complications: Principal | ICD-10-CM

## 2021-09-12 DIAGNOSIS — E1140 Type 2 diabetes mellitus with diabetic neuropathy, unspecified: Principal | ICD-10-CM

## 2021-09-12 DIAGNOSIS — E113293 Type 2 diabetes mellitus with mild nonproliferative diabetic retinopathy without macular edema, bilateral: Principal | ICD-10-CM

## 2021-09-12 MED ORDER — INSULIN DEGLUDEC (U-200) 200 UNIT/ML (3 ML) SUBCUTANEOUS PEN
Freq: Every day | SUBCUTANEOUS | 3 refills | 90 days
Start: 2021-09-12 — End: 2021-12-11

## 2021-09-12 NOTE — Unmapped (Signed)
Jenna Mosley is a 30 y.o. 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     # Diabetes with mild non-proliferative diabetic retinopathy, OU  - Dx'd ~2010  - Recently diagnosed with leukemia, but told it was present back in 2018. Says recent exacerbation of blood sugars with treatment of leukemia.  - currently on metformin (Glucophage) and oral hypoglycemics    Lab Results   Component Value Date    A1C 9.3 (H) 05/14/2021       Plan:  - discussed importance of BG, BP and cholesterol control  - goal Hgb A1c <7.0    RTC: 9 months for DM exam in [facility] (V/T/D)    Patient was discussed with Dr. Unknown Foley, MD  Ophthalmology PGY-2

## 2021-09-13 MED ORDER — INSULIN DEGLUDEC (U-200) 200 UNIT/ML (3 ML) SUBCUTANEOUS PEN
Freq: Every day | SUBCUTANEOUS | 3 refills | 90 days | Status: CP
Start: 2021-09-13 — End: 2021-12-12

## 2021-09-13 MED ORDER — GLIPIZIDE ER 10 MG TABLET, EXTENDED RELEASE 24 HR
ORAL_TABLET | Freq: Every day | ORAL | 3 refills | 90.00000 days | Status: CP
Start: 2021-09-13 — End: 2022-09-13

## 2021-09-13 NOTE — Unmapped (Signed)
Attestation     I discussed the findings, assessment, and plan with the resident and agree with the findings and plan as documented in the resident's note.    Britt Bottom

## 2021-09-14 NOTE — Unmapped (Signed)
Received after-hours call from [facility] to confirm Tresiba dose received by pharmacy this evening for 320 units daily. Reviewed recent MyChart exchange between patient and [name] where patient confirms she is taking 320 units of Tresiba every day, has uptitrated to this dose per Jamie's last note/instructions. Confirmed with pharmacy, they were appreciative of the confirmation.    Rosiland Oz, MD, PhD  PGY-3, Division of Endocrinology  Phone: [phone]  Fax: [phone]

## 2021-09-17 MED ORDER — GLIPIZIDE ER 10 MG TABLET, EXTENDED RELEASE 24 HR
ORAL_TABLET | Freq: Every day | ORAL | 3 refills | 90.00000 days | Status: CP
Start: 2021-09-17 — End: 2022-09-17

## 2021-09-19 NOTE — Unmapped (Signed)
Prior Authorization for Jenna Mosley Approved      Confirmation #:[id number]    Prior Approval #:[id number]    Effective: 09/19/2021 - 09/19/2022

## 2021-09-24 ENCOUNTER — Ambulatory Visit: Admit: 2021-09-24 | Discharge: 2021-09-25 | Payer: MEDICAID | Attending: Dermatology | Primary: Dermatology

## 2021-09-24 DIAGNOSIS — L219 Seborrheic dermatitis, unspecified: Principal | ICD-10-CM

## 2021-09-24 DIAGNOSIS — L408 Other psoriasis: Principal | ICD-10-CM

## 2021-09-24 MED ORDER — CLOBETASOL 0.05 % TOPICAL OINTMENT
Freq: Two times a day (BID) | TOPICAL | 1 refills | 0.00000 days | Status: CN
Start: 2021-09-24 — End: 2022-09-24

## 2021-09-24 MED ORDER — FLUOCINONIDE 0.05 % TOPICAL SOLUTION
Freq: Every day | TOPICAL | 0.00000 days | Status: CN
Start: 2021-09-24 — End: 2022-09-24

## 2021-09-24 MED ORDER — TRIAMCINOLONE ACETONIDE 0.1 % TOPICAL OINTMENT
Freq: Two times a day (BID) | TOPICAL | 3 refills | 0.00000 days | Status: CP
Start: 2021-09-24 — End: 2022-09-24

## 2021-09-24 MED ORDER — CLOBETASOL 0.05 % SCALP SOLUTION
Freq: Two times a day (BID) | TOPICAL | 4 refills | 0.00000 days | Status: CP
Start: 2021-09-24 — End: 2022-09-24

## 2021-09-24 MED ORDER — HYDROCORTISONE 2.5 % TOPICAL OINTMENT
Freq: Two times a day (BID) | TOPICAL | 3 refills | 0.00000 days | Status: CP
Start: 2021-09-24 — End: 2022-09-24

## 2021-09-24 NOTE — Unmapped (Signed)
For your psoriasis:   - Please start the triamcinolone to your abdomen and ears twice a day until clear and no longer itchy, then stop. Avoid your face.  - Start the hydrocortisone to the rash on your face twice a day until clear, then stop     For your scalp:  - Please use clobetasol solution 1-2 times daily as needed until no longer itchy or flaky, then stop    For the body:   - Moisturize several times a day with an ointment (plain petroleum jelly/Vaseline, Aquaphor, coconut oil) or heavy cream (Vanicream, CereVe, Cetaphil, Eucerin)

## 2021-09-24 NOTE — Unmapped (Signed)
Dermatology Note     Assessment and Plan:      Scaly plaque, possible psoriasis, abdomen  - Educated, discussed chronic nature and waxing/waning  - Discussed different treatments including topical corticosteroids  - Topical corticosteroid SER including atrophy, striae, dyschromia; fine to apply to affected areas, not for normal skin  - Start triamcinolone 0.01% ointment twice daily until smooth, then stop. Discussed this cream is not to be used on face.   - Start hydrocortisone twice a day to your rash on the face    Seborrheic dermatitis, scalp  - Start clobetasol 0.05% external solution daily as needed to scalp     Keratosis pilaris, upper arms and chest  - Reviewed benign nature of lesions on exam today  - Discussed today is a good day, but if it were to become more severe, could apply triamcinolone     Cherry angiomas, abdomen  - Reviewed benign nature of lesions on exam today  - Patient agrees to message me if any become bothersome.     Acne, acne excoriee, resolved  - She was prescribed azelaic acid 20% cream to apply to affected areas on her face twice daily. Prescription was not filled because it is not covered by patient's insurance.   - Reviewed the use of benzoyl peroxide spot treatment as needed.    - Discussed the option of using noncomedogenic moisturizers and recommendations provided.    The patient was advised to call for an appointment should any new, changing, or symptomatic lesions develop.     RTC: Return if symptoms worsen or fail to improve. or sooner as needed   _________________________________________________________________      Chief Complaint     Rashes    HPI     Jenna Mosley is a 30 y.o. female who presents as a returning patient (last seen by Dr. Domenic Schwab on 01/02/2020) to [facility] for a lesion of concern and a rash. At last visit, patient was to start azelaic acid 20% for her acne excoriee.     Patient reports her rash is improved, but still present on her face, chest and arms. It has been waxing and waning. Denies pruritus of the body, but endorses forehead pruritus. She also reports a history of seborrheic dermatitis near her nose and ears. Mother reports anxiety and associated hives. She is on chemotherapy (Bosilif). She also has frequent yeast infections in her belly button. Usually it clears within two days when treated with over the counter topicals and vinegar, but is pruritic, scaly, bleeding, and flaring today. Endorses history of psoriasis. Reports dry, flaky, scalp that has been present since childhood.     The patient denies any other new or changing lesions or areas of concern.     Pertinent Past Medical History     No history of skin cancer    Family History:   Negative for melanoma    Past Medical History, Family History, Social History, Medication List, Allergies, and Problem List were reviewed in the rooming section of Epic.     ROS: Other than symptoms mentioned in the HPI, no fevers, chills, or other skin complaints    Physical Examination     GENERAL: Well-appearing female in no acute distress, resting comfortably.  NEURO: Alert and oriented, answers questions appropriately  PSYCH: Normal mood and affect  SKIN (Sun exposed Exam): Per patient request, examination of the face, neck, scalp, bilateral upper extremities, palms, and fingernails was performed, including abdomen  - Angioma(s): Scattered red  vascular papule(s) on the scattered diffusely  - Well-demarcated pink plaques around umbilicus  - diffuse scale on forehead and alar creases  - mild follicular prominence on upper arms and upper chest    All areas not commented on are within normal limits or unremarkable    Scribe's Attestation: Jenna Canterbury, MD obtained and performed the history, physical exam and medical decision making elements that were entered into the chart. Signed by Jenna Mosley, Scribe, on September 24, 2021 at 1:43 PM.    ----------------------------------------------------------------------------------------------------------------------  October 02, 2021 2:09 PM. Documentation assistance provided by the Scribe. I was present during the time the encounter was recorded. The information recorded by the Scribe was done at my direction and has been reviewed and validated by me.    Jenna Post, MD  ----------------------------------------------------------------------------------------------------------------------    (Approved Template 07/30/2020)

## 2021-09-26 ENCOUNTER — Ambulatory Visit: Admit: 2021-09-26 | Discharge: 2021-09-27 | Payer: MEDICAID

## 2021-09-26 ENCOUNTER — Other Ambulatory Visit: Admit: 2021-09-26 | Discharge: 2021-09-27 | Payer: MEDICAID

## 2021-09-26 DIAGNOSIS — C9210 Chronic myeloid leukemia, BCR/ABL-positive, not having achieved remission: Principal | ICD-10-CM

## 2021-09-26 LAB — CBC W/ AUTO DIFF
BASOPHILS ABSOLUTE COUNT: 0.1 10*9/L (ref 0.0–0.1)
BASOPHILS RELATIVE PERCENT: 0.6 %
EOSINOPHILS ABSOLUTE COUNT: 0.2 10*9/L (ref 0.0–0.5)
EOSINOPHILS RELATIVE PERCENT: 2.3 %
HEMATOCRIT: 38.5 % (ref 34.0–44.0)
HEMOGLOBIN: 13 g/dL (ref 11.3–14.9)
LYMPHOCYTES ABSOLUTE COUNT: 3.6 10*9/L (ref 1.1–3.6)
LYMPHOCYTES RELATIVE PERCENT: 43.7 %
MEAN CORPUSCULAR HEMOGLOBIN CONC: 33.7 g/dL (ref 32.0–36.0)
MEAN CORPUSCULAR HEMOGLOBIN: 28.4 pg (ref 25.9–32.4)
MEAN CORPUSCULAR VOLUME: 84.1 fL (ref 77.6–95.7)
MEAN PLATELET VOLUME: 9.1 fL (ref 6.8–10.7)
MONOCYTES ABSOLUTE COUNT: 0.3 10*9/L (ref 0.3–0.8)
MONOCYTES RELATIVE PERCENT: 3.9 %
NEUTROPHILS ABSOLUTE COUNT: 4.1 10*9/L (ref 1.8–7.8)
NEUTROPHILS RELATIVE PERCENT: 49.5 %
PLATELET COUNT: 261 10*9/L (ref 150–450)
RED BLOOD CELL COUNT: 4.58 10*12/L (ref 3.95–5.13)
RED CELL DISTRIBUTION WIDTH: 14.6 % (ref 12.2–15.2)
WBC ADJUSTED: 8.3 10*9/L (ref 3.6–11.2)

## 2021-09-26 LAB — COMPREHENSIVE METABOLIC PANEL
ALBUMIN: 4 g/dL (ref 3.4–5.0)
ALKALINE PHOSPHATASE: 151 U/L — ABNORMAL HIGH (ref 46–116)
ALT (SGPT): 13 U/L (ref 10–49)
ANION GAP: 10 mmol/L (ref 5–14)
AST (SGOT): 22 U/L (ref <=34)
BILIRUBIN TOTAL: 0.4 mg/dL (ref 0.3–1.2)
BLOOD UREA NITROGEN: 13 mg/dL (ref 9–23)
BUN / CREAT RATIO: 16
CALCIUM: 10 mg/dL (ref 8.7–10.4)
CHLORIDE: 102 mmol/L (ref 98–107)
CO2: 26 mmol/L (ref 20.0–31.0)
CREATININE: 0.82 mg/dL — ABNORMAL HIGH
EGFR CKD-EPI (2021) FEMALE: >90 mL/min/{1.73_m2} (ref >=60)
GLUCOSE RANDOM: 309 mg/dL — ABNORMAL HIGH (ref 70–179)
POTASSIUM: 4.4 mmol/L (ref 3.4–4.8)
PROTEIN TOTAL: 7.7 g/dL (ref 5.7–8.2)
SODIUM: 138 mmol/L (ref 135–145)
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
## 2021-09-26 NOTE — Unmapped (Signed)
I am going to plan on good results from your BCR/ABL.  We will not get these results back for several days.  We will call you with the results.     I am going to plan on seeing you back in 3 months.  If we need to see you before then we will let you know.     Lab on 09/26/2021   Component Date Value Ref Range Status    Sodium 09/26/2021 138  135 - 145 mmol/L Final    Potassium 09/26/2021 4.4  3.4 - 4.8 mmol/L Final    Chloride 09/26/2021 102  98 - 107 mmol/L Final    CO2 09/26/2021 26.0  20.0 - 31.0 mmol/L Final    Anion Gap 09/26/2021 10  5 - 14 mmol/L Final    BUN 09/26/2021 13  9 - 23 mg/dL Final    Creatinine 08/65/7846 0.82 (A) 0.60 - 0.80 mg/dL Final    BUN/Creatinine Ratio 09/26/2021 16   Final    eGFR CKD-EPI (2021) Female 09/26/2021 >90  >=60 mL/min/1.76m2 Final    eGFR calculated with CKD-EPI 2021 equation in accordance with [employer] and [employer] of Nephrology Task Force recommendations.    Glucose 09/26/2021 309 (A) 70 - 179 mg/dL Final    Calcium 96/29/5284 10.0  8.7 - 10.4 mg/dL Final    Albumin 13/24/4010 4.0  3.4 - 5.0 g/dL Final    Total Protein 09/26/2021 7.7  5.7 - 8.2 g/dL Final    Total Bilirubin 09/26/2021 0.4  0.3 - 1.2 mg/dL Final    AST 27/25/3664 22  <=34 U/L Final    ALT 09/26/2021 13  10 - 49 U/L Final    Alkaline Phosphatase 09/26/2021 151 (A) 46 - 116 U/L Final    Collection 09/26/2021 Collected   Final    WBC 09/26/2021 8.3  3.6 - 11.2 10*9/L Final    RBC 09/26/2021 4.58  3.95 - 5.13 10*12/L Final    HGB 09/26/2021 13.0  11.3 - 14.9 g/dL Final    HCT 40/34/7425 38.5  34.0 - 44.0 % Final    MCV 09/26/2021 84.1  77.6 - 95.7 fL Final    MCH 09/26/2021 28.4  25.9 - 32.4 pg Final    MCHC 09/26/2021 33.7  32.0 - 36.0 g/dL Final    RDW 95/63/8756 14.6  12.2 - 15.2 % Final    MPV 09/26/2021 9.1  6.8 - 10.7 fL Final    Platelet 09/26/2021 261  150 - 450 10*9/L Final    Neutrophils % 09/26/2021 49.5  % Final    Lymphocytes % 09/26/2021 43.7  % Final    Monocytes % 09/26/2021 3.9  % Final    Eosinophils % 09/26/2021 2.3  % Final    Basophils % 09/26/2021 0.6  % Final    Absolute Neutrophils 09/26/2021 4.1  1.8 - 7.8 10*9/L Final    Absolute Lymphocytes 09/26/2021 3.6  1.1 - 3.6 10*9/L Final    Absolute Monocytes 09/26/2021 0.3  0.3 - 0.8 10*9/L Final    Absolute Eosinophils 09/26/2021 0.2  0.0 - 0.5 10*9/L Final    Absolute Basophils 09/26/2021 0.1  0.0 - 0.1 10*9/L Final

## 2021-10-02 NOTE — Unmapped (Signed)
[facility] Refill Coordination Note    Specialty Medication(s) to be Shipped:   Hematology/Oncology: Bosulif    Other medication(s) to be shipped: No additional medications requested for fill at this time     Jenna Mosley, DOB: [birth year]  Phone: [phone] (home)       All above HIPAA information was verified with patient.     Was a Nurse, learning disability used for this call? No    Completed refill call assessment today to schedule patient's medication shipment from the [facility] [phone]).  All relevant notes have been reviewed.     Specialty medication(s) and dose(s) confirmed: Regimen is correct and unchanged.   Changes to medications: Jenna Mosley reports stopping the following medications: Lasix  Changes to insurance: No  New side effects reported not previously addressed with a pharmacist or physician: None reported  Questions for the pharmacist: No    Confirmed patient received a Conservation officer, historic buildings and a Surveyor, mining with first shipment. The patient will receive a drug information handout for each medication shipped and additional FDA Medication Guides as required.       DISEASE/MEDICATION-SPECIFIC INFORMATION        N/A    SPECIALTY MEDICATION ADHERENCE     Medication Adherence    Patient reported X missed doses in the last month: 2  Specialty Medication: Bosulif 500mg   Patient is on additional specialty medications: No  Informant: patient              Were doses missed due to medication being on hold? No    Bosulif  500 mg: 9 days of medicine on hand       REFERRAL TO PHARMACIST     Referral to the pharmacist: Not needed      [facility]     Shipping address confirmed in Epic.     Delivery Scheduled: Yes, Expected medication delivery date: 10/08/21.     Medication will be delivered via Next Day Courier to the prescription address in Epic [place].    Jenna Mosley   [facility] Specialty Technician

## 2021-10-07 MED FILL — BOSULIF 500 MG TABLET: ORAL | 30 days supply | Qty: 30 | Fill #2

## 2021-10-08 ENCOUNTER — Other Ambulatory Visit: Payer: Self-pay

## 2021-10-08 ENCOUNTER — Encounter: Payer: Self-pay | Admitting: Psychiatry

## 2021-10-08 ENCOUNTER — Ambulatory Visit (INDEPENDENT_AMBULATORY_CARE_PROVIDER_SITE_OTHER): Payer: No Typology Code available for payment source | Admitting: Psychiatry

## 2021-10-08 VITALS — BP 133/80 | HR 82 | Temp 98.1°F | Ht 72.84 in | Wt 343.4 lb

## 2021-10-08 DIAGNOSIS — F419 Anxiety disorder, unspecified: Secondary | ICD-10-CM | POA: Diagnosis not present

## 2021-10-08 DIAGNOSIS — F3174 Bipolar disorder, in full remission, most recent episode manic: Secondary | ICD-10-CM

## 2021-10-08 DIAGNOSIS — Z79899 Other long term (current) drug therapy: Secondary | ICD-10-CM | POA: Insufficient documentation

## 2021-10-08 DIAGNOSIS — F4310 Post-traumatic stress disorder, unspecified: Secondary | ICD-10-CM | POA: Insufficient documentation

## 2021-10-08 DIAGNOSIS — Z9189 Other specified personal risk factors, not elsewhere classified: Secondary | ICD-10-CM

## 2021-10-08 NOTE — Patient Instructions (Signed)
Call [phone] for EKG.

## 2021-10-08 NOTE — Progress Notes (Deleted)
error 
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
## 2021-10-15 ENCOUNTER — Ambulatory Visit: Admit: 2021-10-15 | Discharge: 2021-10-16 | Payer: MEDICAID

## 2021-10-15 DIAGNOSIS — E782 Mixed hyperlipidemia: Principal | ICD-10-CM

## 2021-10-15 DIAGNOSIS — E119 Type 2 diabetes mellitus without complications: Principal | ICD-10-CM

## 2021-10-15 DIAGNOSIS — Z794 Long term (current) use of insulin: Principal | ICD-10-CM

## 2021-10-15 DIAGNOSIS — I10 Essential (primary) hypertension: Principal | ICD-10-CM

## 2021-10-15 DIAGNOSIS — E1165 Type 2 diabetes mellitus with hyperglycemia: Principal | ICD-10-CM

## 2021-10-15 LAB — C-PEPTIDE: C-PEPTIDE: 14.28 ng/mL — ABNORMAL HIGH (ref 0.48–5.05)

## 2021-10-15 MED ORDER — DEXCOM G6 RECEIVER MISC
3 refills | 0 days | Status: CP
Start: 2021-10-15 — End: ?

## 2021-10-15 MED ORDER — PEN NEEDLE, DIABETIC 32 GAUGE X 1/4" (6 MM)
Freq: Every day | 4 refills | 0 days | Status: CP
Start: 2021-10-15 — End: 2022-01-13

## 2021-10-15 MED ORDER — HUMULIN R U-500 (CONC) INSULIN KWIKPEN 500 UNIT/ML (3 ML) SUBCUTANEOUS
Freq: Two times a day (BID) | SUBCUTANEOUS | 3 refills | 90 days | Status: CP
Start: 2021-10-15 — End: 2022-10-15

## 2021-10-15 MED ORDER — DEXCOM G6 TRANSMITTER DEVICE
3 refills | 0 days | Status: CP
Start: 2021-10-15 — End: ?

## 2021-10-15 MED ORDER — DEXCOM G6 SENSOR DEVICE
11 refills | 0 days | Status: CP
Start: 2021-10-15 — End: ?

## 2021-10-15 MED ORDER — ACCU-CHEK GUIDE TEST STRIPS
11 refills | 0 days | Status: CP
Start: 2021-10-15 — End: ?

## 2021-10-15 MED ORDER — METFORMIN 1,000 MG TABLET
ORAL_TABLET | Freq: Two times a day (BID) | ORAL | 3 refills | 90 days | Status: CP
Start: 2021-10-15 — End: ?

## 2021-10-15 MED ORDER — LIRAGLUTIDE 0.6 MG/0.1 ML (18 MG/3 ML) SUBCUTANEOUS PEN INJECTOR
Freq: Every day | SUBCUTANEOUS | 12 refills | 90 days | Status: CP
Start: 2021-10-15 — End: 2022-10-15

## 2021-10-15 NOTE — Unmapped (Signed)
It was a pleasure seeing you today at our [facility]ocrinology clinic.   If any testing was ordered today, most of the time the results will appear in your MyChart account even before I see them. Once all your results are available, I will contact you within 1-2 weeks with an explanation. Please contact me if you do not hear from me by then.   Please make sure you have access to MyChart since this is how I will communicate with you.  If you do not have MyChart, I will send you a letter which may take 1-2 weeks to arrive.      Remember to call us with any urgent issues. Please allow a few days for mychart messages to be read and answered.     If you are seen for diabetes, please bring your glucometer and all devices to every visit.   All refill requests must be submitted 14 days in advance to allow enough time to be processed.     Start U500 insulin inject 200 units with breakfast and 200 units with dinner. If after 3 days, glucose before meals remain > 150, you can start increasing insulin by 10 units every 3 days until sugars before meals remain between 100-150.   Stop glipizide  Continue metformin 1g bid  Victoza 1.8 mg every day  Will prescribe Dexcom G6, pending insurance coverage.
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
## 2021-10-16 MED ORDER — FUROSEMIDE 20 MG TABLET
ORAL_TABLET | Freq: Every day | ORAL | 11 refills | 30.00000 days | Status: CP
Start: 2021-10-16 — End: 2022-10-16

## 2021-10-16 NOTE — Unmapped (Signed)
Applied Dexcom pro to patient and set up unblinded app access. Instructed on care and upkeep, pt will message clinic at the end of 10 days so we can pull data.

## 2021-10-16 NOTE — Unmapped (Signed)
Pulmonary Phone Note:    Called patient to discuss weight gain and shortness of breath. She does not endorse any leg swelling or orthopnea, but does endorse abdominal distension. Given her 14-lb weight gain since her visit (after stopping furosemide) and associated dyspnea with any activity, re-initiation of furosemide is reasonable.    Plan  - Restart furosemide 20mg  by mouth daily  - Patient instructed to seek short-term follow-up at PCP's office locally  - Patient to present to emergency department for worsening/progressive symptoms, hypoxemia, or other concerning symptoms.      Hilda Blades, MD  [facility] Pulmonary & Critical Care Fellow, PGY-4  10/16/2021

## 2021-10-16 NOTE — Unmapped (Unsigned)
ACCU guide me Meter downloaded. POC glucose and A1C done today. PP 328. 1245 mg/dL.

## 2021-10-17 NOTE — Unmapped (Signed)
Patient called and left a message.  Recently seen by pulmonology and was put back on Lasix due to weight gain, SOB and chest heaviness.  Patient was advised to see PCP by Monday, Dec 5th. No available appointments until Dec 5th. Nurse scheduled patient to see Dr. Theadora Rama for Dec 5th.  Please advise.

## 2021-10-18 ENCOUNTER — Other Ambulatory Visit: Payer: Self-pay

## 2021-10-18 ENCOUNTER — Ambulatory Visit
Admission: RE | Admit: 2021-10-18 | Discharge: 2021-10-18 | Disposition: A | Discharge status: Home / Self Care | Payer: Medicaid Other | Source: Ambulatory Visit | Attending: Child and Adolescent Psychiatry | Admitting: Child and Adolescent Psychiatry

## 2021-10-18 ENCOUNTER — Other Ambulatory Visit
Admission: RE | Admit: 2021-10-18 | Discharge: 2021-10-18 | Disposition: A | Discharge status: Home / Self Care | Payer: Medicaid Other | Source: Ambulatory Visit | Attending: Child and Adolescent Psychiatry | Admitting: Child and Adolescent Psychiatry

## 2021-10-18 DIAGNOSIS — Z79899 Other long term (current) drug therapy: Secondary | ICD-10-CM | POA: Insufficient documentation

## 2021-10-18 DIAGNOSIS — F419 Anxiety disorder, unspecified: Secondary | ICD-10-CM | POA: Insufficient documentation

## 2021-10-18 DIAGNOSIS — Z9189 Other specified personal risk factors, not elsewhere classified: Secondary | ICD-10-CM | POA: Diagnosis not present

## 2021-10-18 DIAGNOSIS — F4310 Post-traumatic stress disorder, unspecified: Secondary | ICD-10-CM | POA: Diagnosis not present

## 2021-10-18 DIAGNOSIS — Z5181 Encounter for therapeutic drug level monitoring: Secondary | ICD-10-CM | POA: Diagnosis not present

## 2021-10-18 DIAGNOSIS — F3174 Bipolar disorder, in full remission, most recent episode manic: Secondary | ICD-10-CM | POA: Diagnosis present

## 2021-10-18 LAB — LIPID PANEL
Cholesterol: 165 mg/dL (ref 0–200)
HDL: 36 mg/dL — ABNORMAL LOW (ref >40)
LDL Cholesterol: 98 mg/dL (ref 0–99)
Total CHOL/HDL Ratio: 4.6 RATIO
Triglycerides: 156 mg/dL — ABNORMAL HIGH (ref <150)
VLDL: 31 mg/dL (ref 0–40)

## 2021-10-18 LAB — COMPREHENSIVE METABOLIC PANEL
ALT: 14 U/L (ref 0–44)
AST: 23 U/L (ref 15–41)
Albumin: 3.7 g/dL (ref 3.5–5.0)
Alkaline Phosphatase: 129 U/L — ABNORMAL HIGH (ref 38–126)
Anion gap: 8 (ref 5–15)
BUN: 14 mg/dL (ref 6–20)
CO2: 24 mmol/L (ref 22–32)
Calcium: 9.6 mg/dL (ref 8.9–10.3)
Chloride: 105 mmol/L (ref 98–111)
Creatinine, Ser: 0.85 mg/dL (ref 0.44–1.00)
GFR, Estimated: >60 mL/min (ref >60)
Glucose, Bld: 186 mg/dL — ABNORMAL HIGH (ref 70–99)
Potassium: 4.1 mmol/L (ref 3.5–5.1)
Sodium: 137 mmol/L (ref 135–145)
Total Bilirubin: 0.8 mg/dL (ref 0.3–1.2)
Total Protein: 7.4 g/dL (ref 6.5–8.1)

## 2021-10-18 LAB — BILIRUBIN, DIRECT: Bilirubin, Direct: <0.1 mg/dL (ref 0.0–0.2)

## 2021-10-18 LAB — VALPROIC ACID LEVEL: Valproic Acid Lvl: 13 ug/mL — ABNORMAL LOW (ref 50.0–100.0)

## 2021-10-18 LAB — TSH: TSH: 0.504 u[IU]/mL (ref 0.350–4.500)

## 2021-10-18 LAB — PLATELET COUNT: Platelets: 266 10*3/uL (ref 150–400)

## 2021-10-19 LAB — ANTI-ISLET CELL ANTIBODY: ISLET CELL ANTIBODY: 0 nmol/L

## 2021-10-21 ENCOUNTER — Ambulatory Visit: Admit: 2021-10-21 | Discharge: 2021-10-22 | Payer: MEDICAID

## 2021-10-21 ENCOUNTER — Ambulatory Visit: Admit: 2021-10-21 | Discharge: 2021-10-22 | Payer: MEDICAID | Attending: Clinical | Primary: Clinical

## 2021-10-21 ENCOUNTER — Ambulatory Visit: Admit: 2021-10-21 | Discharge: 2021-10-22 | Payer: MEDICAID | Attending: Internal Medicine | Primary: Internal Medicine

## 2021-10-21 LAB — B-TYPE NATRIURETIC PEPTIDE: B-TYPE NATRIURETIC PEPTIDE: 8 pg/mL (ref <=100)

## 2021-10-21 LAB — BASIC METABOLIC PANEL
ANION GAP: 12 mmol/L (ref 5–14)
BLOOD UREA NITROGEN: 20 mg/dL (ref 9–23)
BUN / CREAT RATIO: 16
CALCIUM: 10 mg/dL (ref 8.7–10.4)
CHLORIDE: 105 mmol/L (ref 98–107)
CO2: 24 mmol/L (ref 20.0–31.0)
CREATININE: 1.26 mg/dL — ABNORMAL HIGH
EGFR CKD-EPI (2021) FEMALE: 58 mL/min/{1.73_m2} — ABNORMAL LOW (ref >=60)
GLUCOSE RANDOM: 172 mg/dL (ref 70–179)
POTASSIUM: 4.6 mmol/L (ref 3.4–4.8)
SODIUM: 141 mmol/L (ref 135–145)

## 2021-10-21 LAB — INSULIN-LIKE GROWTH FACTOR 1 (IGF-1)
IGF-1: 62 ng/mL
Z-SCORE: -1.96 {STDV}

## 2021-10-21 NOTE — Unmapped (Signed)
Patient ID: Jenna Mosley is a 30 y.o. female who presents for new concerns of SOB and restarting Lasix.    Informant: Patient is accompanied by mother.    Assessment/Plan:      SOB  1 month of SOB, 15 lb weight gain, and chest pressure after stopping Lasix. After 4 days of restarting Lasix symptoms have improved some. Lungs clear on exam without focal lung findings. No LE edema and no fluid wave on abd exam. Had previous normal Echo in 2020, but poor views. Given recent normal EKG and improvement of symptoms with lasix, symptoms less likely to be due to CAD. Also less likely to be pulmonary embolism given no LE edema and improvement of symptoms on Lasix. No focal lung findings on exam, so less likely to be a pleural effusion. Could consider pericarditis, valvular disease, myopathy, or other cardiogenic causes of pulmonary edema. No fever or chills so less likely to be due to infectious etiology. Hemoglobin in November was 13.0 and no tachycardia today, so symptoms unlikely to be due to anemia. Patient is also on extensive medication regiment, and her medications such as lamotrigine could be cause of her hypervolemic symptoms. Will repeat Echo given BMI now lower. Will also order BNP to assess for baseline cardiac strain. Pt previously had Pro-BNP of 499.0 in 2020 during SOB work-up that resolved in 2 weeks of lasix. BMP today to assess electrolytes and Cr after restarting Lasix. CXR today to r/o pleural effusion and to assess any pulmonary edema.  -BNP, BMP, and CXR ordered  -Echo, future  -Follow-up with pulmonology for planned spirometry and nitrogen washout lung volumes    Shizo affective Schizophrenia  Reviewed recent labs with patient. Patient currently on Depakote, lamotrigine, and geodem. Follows with [facility]. VPA level at 13, but will defer to pt's psychiatrist for titration. Otherwise labs were within normal limits.  -Follow-up with Psychiatry    Preventive services addressed today  Preventive services are currently up to date  Covid vaccine/booster: outside records requested    -- Patient verbalized an understanding of today's assessment and recommendations, as well as the purpose of ongoing medications.    No follow-ups on file.    Subjective:     HPI  SOB  Patient recently trialed off of Lasix after pulmonology appointment on 09/04/21. Pt then developed SOB, chest heaviness, and gained 15 lbs in weight. Has dyspnea on exertion that has worsened. SOB while walking now. SOB worse while lying down and lying forward. Did not have LE edema, but did report some abd distention while off Lasix. Pt reports having a normal EKG on 10/18/21 with most recent lab work with her psychiatrist. Pt started taking Lasix 4 days ago. Now taking Lasix 20mg  daily. Previously on Lasix 40 mg every other day. Patient reports symptoms are improving with Lasix and has since lost 5 lbs. No fever, chills, cough, hemoptysis, night sweats, recent tobacco or e-cigarette use, or recent illness. Denies any snoring. Recent sleep study did not showed evidence of sleep apnea. Has baseline chest pain which she attributes to anxiety. Otherwise no chest pain. Additionally, reports having prior asthma that was associated with her GERD. Symptoms have resolved after starting Protonix.    Per chart review, patient was hypoxemic to the mid 80s with 20-30 lb weight gain 07/2019. She was admitted and SpO2 rebounded to 90's after being placed on 2L [place]. CT scan of the chest showed bilateral groundglass opacities, greater in the upper lobes than  lower which warranted. Was started on diuretics and weaned off O2. Hypersensitivity pneumonitis panel was negative. IgE as elevated to 554. Rheum work up showed neg ENA screen and neg ANCA. Repeat CT scan prior discharge showed improvement in the groundglass opacities, suggestive of pulmonary edema as cause of hypoxemia. Pro-BNP was 499.0 but normalized after 2 weeks of Lasix. Echo was normal, but limited with poor views. Had since been on Lasix 40 mg, every other day.    Schizoaffective Schizophrenia  Currently on Depakote ER 500 mg daily, Lamotrigine 200 mg BID, and Geodem 80 mg daily. Patient had recent labs and EKG on 10/18/21 with her psychiatrist as part of her routine labs. CMP showed glucose of 186 mg/dl and alkaline phos was 295, but otherwise normal. D. Bili was <0.1. VPA level was 13. Platelet count was 266. Triglycerides were 156, HDL 36, Total cholesterol was 165, LDL was 98. TSH was 0.504. Reports EKG was normal.    Toe Nails  Patient reports toe nails turned black and fell off several months ago. Have since regrown. Concerned about yellow discoloration and reports they have become more brittle.    ROS  As per HPI.    Outpatient Medications Prior to Visit   Medication Sig Dispense Refill   ??? acetone, urine, test (KETONE URINE TEST) Strp Dispense 100.  Use as needed. (Patient taking differently: Dispense 100.  Use as needed.) 100 strip 2   ??? atenoloL (TENORMIN) 100 MG tablet Take 1 tablet (100 mg total) by mouth in the morning. 90 tablet 3   ??? blood sugar diagnostic (ACCU-CHEK GUIDE TEST STRIPS) Strp Check sugars before meals three times for insulin dependent type two diabetes. 100 each 11   ??? blood-glucose meter kit Use as instructed - pt prefers a larger monitor glucometer is available 1 each 0   ??? blood-glucose meter,continuous (DEXCOM G6 RECEIVER) Misc Dispense DexCom G6 Receiver 1 each 3   ??? blood-glucose sensor (DEXCOM G6 SENSOR) Devi Dispense DexCom G6 sensors; Use 1 sensor q 10 days 3 each 11   ??? blood-glucose transmitter (DEXCOM G6 TRANSMITTER) Devi DexCom G6 transmitter; Use 1 every 90 days 1 each 3   ??? bosutinib 500 mg Tab Take 1 tablet (500 mg total) by mouth daily. 30 tablet 5   ??? calcium carbonate-vitamin D3 600 mg-20 mcg (800 unit) Tab Take 1 mg by mouth Two (2) times a day (at 8am and 12:00).     ??? cetirizine (ZYRTEC) 10 MG tablet Take 1 tablet (10 mg total) by mouth in the morning. 90 tablet 3   ??? clobetasoL (TEMOVATE) 0.05 % external solution Apply topically Two (2) times a day. For scaling or itching in scalp as needed 50 mL 4   ??? divalproex ER (DEPAKOTE ER) 500 MG extended released 24 hr tablet TAKE 1 TABLET BY MOUTH AT BEDTIME 90 tablet 3   ??? ferrous sulfate 325 (65 FE) MG tablet Take 1 tablet by mouth daily.     ??? flash glucose sensor (FLASH GLUCOSE SENSOR) kit by Other route every fourteen (14) days. 1 each 11   ??? furosemide (LASIX) 20 MG tablet Take 1 tablet (20 mg total) by mouth daily. 30 tablet 11   ??? hydroCHLOROthiazide (HYDRODIURIL) 25 MG tablet Take 1 tablet (25 mg total) by mouth in the morning. 90 tablet 3   ??? hydrocortisone 2.5 % ointment Apply 1 application topically Two (2) times a day. To face until clear, then stop. 30 g 3   ??? hydrOXYzine (  VISTARIL) 50 MG capsule TAKE 1 CAPSULE BY MOUTH AT BEDTIME AS NEEDED TO IMPROVE SLEEP 90 capsule 3   ??? insulin regular hum U-500 conc (HUMULIN R U-500, CONC, KWIKPEN) 500 unit/mL (3 mL) CONCENTRATED injection Inject 250 Units under the skin Two (2) times a day (30 minutes before a meal). Start with 200 units twice a day with breakfast and dinner. Increase by 10 units every 3 days until pre-meal sugars remain between 100-150. Max of 500 units a day. Give 3 months supply 90 mL 3   ??? lamoTRIgine (LAMICTAL) 200 MG tablet TAKE 1 TABLET BY MOUTH TWICE DAILY 180 tablet 3   ??? lancets (ACCU-CHEK SOFTCLIX LANCETS) Misc Check sugar three times per day before meals for insulin dependent type two diabetes.  E11.65 100 each 11   ??? liraglutide (VICTOZA) injection pen Inject 0.3 mL (1.8 mg total) under the skin daily. 27 mL 12   ??? metFORMIN (GLUCOPHAGE) 1000 MG tablet Take 1 tablet (1,000 mg total) by mouth in the morning and 1 tablet (1,000 mg total) in the evening. Take with meals. 180 tablet 3   ??? norethindrone (AYGESTIN) 5 mg tablet Take 1 tablet (5 mg total) by mouth in the morning. 30 tablet 11   ??? pen needle, diabetic (NOVOFINE 32) 32 gauge x 1/4 (6 mm) Ndle Use 3 times daily as directed. 300 each 4   ??? pregabalin (LYRICA) 75 MG capsule TAKE 1 CAPSULE BY MOUTH EVERY MORNING and 2 EVERY EVENING 270 capsule 3   ??? traZODone (DESYREL) 100 MG tablet TAKE 2 TABLETS BY MOUTH AT BEDTIME 180 tablet 3   ??? triamcinolone (KENALOG) 0.1 % ointment Apply topically Two (2) times a day. To your abdomen until clear, then stop. AVOID FACE 80 g 3   ??? valsartan (DIOVAN) 160 MG tablet Take 1 tablet (160 mg total) by mouth daily. 90 tablet 3   ??? ziprasidone (GEODON) 80 MG capsule Take 80 mg by mouth 2 (two) times a day with meals.      ??? hydroCHLOROthiazide (HYDRODIURIL) 25 MG tablet 1 tablet every morning.     ??? insulin lispro (HUMALOG KWIKPEN INSULIN) 100 unit/mL injection pen Check blood sugars before meals and bedtime.  Take sliding scale insulin as directed: for blood sugars < 150 = no insulin; blood sugars 151-200 = take 2 units; blood sugars 201-250 = 4 units; blood sugars 251-300 = 6 units; blood sugars 301-350 = 8 units; blood sugars 351-400 = take 10 units; blood sugars > 400 = take 12 units and recheck blood sugar in 1 hour and reapply sliding scale insulin (max dose insulin = 36 units).  If blood sugars remain elevated > 400 after taking max dose of insulin, go to nearest ER to be evaluated. (Patient not taking: Reported on 10/21/2021) 3 mL 12   ??? albuterol HFA 90 mcg/actuation inhaler Inhale 2 puffs every six (6) hours as needed. 8 g 11   ??? calcium carbonate/vitamin D3 (CALTRATE 600 + D ORAL) Take 1 Dose by mouth.       No facility-administered medications prior to visit.       The following portions of the patient's history were reviewed and updated as appropriate: current medications, past medical history and problem list.    Objective:       Vital Signs  BP 108/58 (BP Site: L Arm, BP Position: Sitting, BP Cuff Size: Large)  - Pulse 83  - Ht 182.9 cm (6' 0.01)  - Wt (!) 156.7 kg (  345 lb 8 oz)  - SpO2 98%  - Breastfeeding No  - BMI 46.85 kg/m?? Exam  General: NAD, well-appearing, pleasant 31 yo.   HEEENT: Anicteric sclerae. NCAT.  RESP: Relaxed respiratory effort. Clear to auscultation without wheezes or crackles throughout.  CV: Regular rate and rhythm. Normal S1 and S2. No murmurs or gallops.    ABD: soft, non-distended, and tympanic. Mildly tender to palpation in the RUQ and LUQ. Neg murphy sign. No fluid wave.  EXT: No lower extremity edema. Posterior tibial pulses are 1+ and symmetric. Distal small yellow-discoloration of distal nail on 1st toes bilaterally. Otherwise nail with appropriate pink-pale color and without hematoma. Healing nicely.      MSK: No focal muscle tenderness.  SKIN: Appropriately warm and moist.  NEURO: Stable gait and coordination.    Note - This record has been created using [employer]. Chart creation errors have been sought, but may not always have been located. Such creation errors do not reflect on the standard of medical care.    I attest that I, Dewaine Conger, evaluated this patient and created this note for Durenda Hurt, MD .    Dewaine Conger  MS3, [id number] [facility]  October 21, 2021    I attest that I have reviewed the student note and that the components of the history of the present illness, the physical exam, and the assessment and plan documented were performed by me or were performed in my presence by the student where I verified the documentation and performed (or re-performed) the exam and medical decision making.     Durenda Hurt, MD  October 21, 2021 4:20 PM
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
## 2021-10-21 NOTE — Progress Notes (Signed)
Dr. Eappen's pt

## 2021-10-24 ENCOUNTER — Institutional Professional Consult (permissible substitution): Admit: 2021-10-24 | Discharge: 2021-10-25 | Payer: MEDICAID

## 2021-10-24 MED ADMIN — leuprolide (LUPRON) 11.25 mg injection: INTRAMUSCULAR | @ 19:00:00 | Stop: 2021-10-24

## 2021-10-24 MED ADMIN — leuprolide (LUPRON) injection 11.25 mg: 11.25 mg | INTRAMUSCULAR | @ 19:00:00 | Stop: 2021-10-24

## 2021-10-24 NOTE — Unmapped (Signed)
1335 Administered Lupron and patient tolerated well. Applied band-aid .

## 2021-10-24 NOTE — Progress Notes (Signed)
Bipolar 1 disorder, manic, full remission (HCC)  F31.74 TSH      Valproic acid level      Platelet count     2. PTSD (post-traumatic stress disorder)  F43.10       3. Anxiety disorder, unspecified type  F41.9       4. At risk for prolonged QT interval syndrome  Z91.89 EKG 12-Lead     5. High risk medication use  Z79

## 2021-10-28 ENCOUNTER — Telehealth: Admit: 2021-10-28 | Discharge: 2021-10-29 | Payer: MEDICAID

## 2021-10-28 NOTE — Unmapped (Signed)
I will send you a MyChart message if Dr. Koren Shiver would like to adjust your medications based on Dexcom data.     Pick up Dexcom from pharmacy when able.    Contact Dexcom support if you experience sensor failure or removal before 10 day period expires; also to request free overpatches from Dexcom:   -in G6 mobile app go to settings--->contact    Send MyChart message or call RN clinic line with any questions or concerns.

## 2021-10-28 NOTE — Unmapped (Signed)
Nurse Telephone Visit: Audio Only    Time spent with patient: 30 minutes  Time spent pre/post charting: 20 minutes    Patient referred by Dr. Koren Shiver for [facility] data review and to determine if patient would like to continue with CGM therapy.    Patient states she really likes Dexcom but is concerned about Medicaid coverage. Educated patient the Medicaid usually covers Dexcom once a Prior Authorization is completed which I will do today. Patient stating she wants her prescription sent to [facility] in [place].  Patient stating she had the Dexcom G6 mobile app downloaded on her phone and is connected to clinic Dexcom Clarity account.    Dexcom Clarity Data reviewed with patient:    Very High: 29%  High: 34%  TIR: 36%  Low: <1%  Very low: <1%  Average BG: 213  Average AM fasting BG:150-180    Patterns: Post prandial excursion after breakfast, remains >180 until around 0300.    Educated patient that in range is defined as BG 70-180 and that goal is to have TIR at least 70%.      Currently taking:  u500: 110 units BID    Patient states she starts experiencing hypoglycemic symptoms when BG around 130. Educated patient that the goal is to gradually lower BG so that fasting AM BG <140 in order to minimize hypoglycemic symptoms until she adjusts to having more TIR and lower BGs. Patient verbalized understanding.    PA submitted for Dexcom G6 sensors, transmitter, and receiver via [employer], approved, see photos below. Informed patient that the PA was approved and she would be able to pick up prescriptions when she is able.    Patient educated on sensor insertion, sensor and transmitter life, sensor overpatches, how to contact Dexcom through G6 mobile app to request overpatches and new sensors should one fail or fall off before 10 day period expires. Patient educated on skin irritation troubleshooting. All questions answered, patient verbalized understanding. Patient instructed to call RN clinic line or send MyChart message with further questions or issues.

## 2021-10-28 NOTE — Unmapped (Signed)
[facility] Refill Coordination Note    Specialty Medication(s) to be Shipped:   Hematology/Oncology: Bosulif    Other medication(s) to be shipped: No additional medications requested for fill at this time     Jenna Mosley, DOB: Aug 28, 1991  Phone: [phone] (home)       All above HIPAA information was verified with patient's family member, Jenna Mosley.     Was a Nurse, learning disability used for this call? No    Completed refill call assessment today to schedule patient's medication shipment from the [facility] [phone]).  All relevant notes have been reviewed.     Specialty medication(s) and dose(s) confirmed: Regimen is correct and unchanged.   Changes to medications: Jenna Mosley reports starting the following medications: Lasix 20mg  1QD and Humulin U-500 110uBID stopped [place]  Changes to insurance: No  New side effects reported not previously addressed with a pharmacist or physician: None reported  Questions for the pharmacist: No    Confirmed patient received a Conservation officer, historic buildings and a Surveyor, mining with first shipment. The patient will receive a drug information handout for each medication shipped and additional FDA Medication Guides as required.       DISEASE/MEDICATION-SPECIFIC INFORMATION        N/A    SPECIALTY MEDICATION ADHERENCE     Medication Adherence    Patient reported X missed doses in the last month: 1  Specialty Medication: Bosulif 500mg   Patient is on additional specialty medications: No  Informant: mother              Were doses missed due to medication being on hold? No    Bosulif 500 mg: 14 days of medicine on hand       REFERRAL TO PHARMACIST     Referral to the pharmacist: Not needed      [facility]     Shipping address confirmed in Epic.     Delivery Scheduled: Yes, Expected medication delivery date: 11/07/21.     Medication will be delivered via Next Day Courier to the prescription address in Epic [place].    Jenna Mosley   [facility] Specialty Technician

## 2021-10-31 ENCOUNTER — Other Ambulatory Visit: Payer: Self-pay

## 2021-10-31 ENCOUNTER — Ambulatory Visit (INDEPENDENT_AMBULATORY_CARE_PROVIDER_SITE_OTHER): Payer: No Typology Code available for payment source | Admitting: Psychiatry

## 2021-10-31 ENCOUNTER — Encounter: Payer: Self-pay | Admitting: Psychiatry

## 2021-10-31 ENCOUNTER — Telehealth: Payer: Self-pay | Admitting: Psychiatry

## 2021-10-31 VITALS — BP 136/81 | HR 86 | Temp 98.7°F | Wt 348.8 lb

## 2021-10-31 DIAGNOSIS — F3174 Bipolar disorder, in full remission, most recent episode manic: Secondary | ICD-10-CM | POA: Diagnosis not present

## 2021-10-31 DIAGNOSIS — Z9189 Other specified personal risk factors, not elsewhere classified: Secondary | ICD-10-CM | POA: Diagnosis not present

## 2021-10-31 DIAGNOSIS — F419 Anxiety disorder, unspecified: Secondary | ICD-10-CM | POA: Diagnosis not present

## 2021-10-31 DIAGNOSIS — Z79899 Other long term (current) drug therapy: Secondary | ICD-10-CM

## 2021-10-31 DIAGNOSIS — F4310 Post-traumatic stress disorder, unspecified: Secondary | ICD-10-CM | POA: Diagnosis not present

## 2021-10-31 MED ORDER — DIVALPROEX SODIUM ER 500 MG PO TB24
500.0000 mg | ORAL_TABLET | Freq: Every day | ORAL | 1 refills | Status: DC
Start: 2021-10-31 — End: 2021-12-31

## 2021-10-31 MED ORDER — ZIPRASIDONE HCL 80 MG PO CAPS: ORAL_CAPSULE | ORAL | 1 refills | Status: DC

## 2021-10-31 MED ORDER — TRAZODONE HCL 100 MG PO TABS: 200.0000 mg | ORAL_TABLET | Freq: Every day | ORAL | 1 refills | Status: DC

## 2021-10-31 MED ORDER — LAMOTRIGINE 200 MG PO TABS: 200.0000 mg | ORAL_TABLET | Freq: Two times a day (BID) | ORAL | 1 refills | Status: DC

## 2021-10-31 NOTE — Telephone Encounter (Signed)
Patient / mother called in to see if the records had been sent over from previous psy

## 2021-10-31 NOTE — Progress Notes (Signed)
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  Patient today returns reporting that she has not been able to get the medical records as requested last visit.  She reports she was told that she will have to pay out of pocket for the records to be mailed to [place].  Patient reports she did have a discussion with staff here before she came into the session and they are trying to obtain her medication list.  Patient reports overall she has been doing okay.  She reports although she is depressed and anxious she is currently at a better place with the current medications.  Patient denies any side effects to her medications.  Patient reports sleep is restless at times however overall she has been doing well.  She is not interested in increasing the dosage of her Depakote and wants to stay on this medication regimen at this time.  She denies any suicidality or homicidality or perceptual disturbances.  Patient reports she is planning to go on a date this weekend and looks forward to that.  Patient continues to have good support system from her mother.  Visit Diagnosis:    ICD-10-CM   1. Bipolar 1 disorder, manic, full remission (HCC)  F31.74 ziprasidone (GEODON) 80 MG capsule    traZODone (DESYREL) 100 MG tablet    lamoTRIgine (LAMICTAL) 200 MG tablet    divalproex (DEPAKOTE ER) 500 MG 24 hr tablet    2. PTSD (post-traumatic stress disorder)  F43.10 traZODone (DESYREL) 100 MG tablet    lamoTRIgine (LAMICTAL) 200 MG tablet    3. Anxiety disorder, unspecified type  F41.9     4. At risk for prolonged QT interval syndrome   Z91.89     5. High risk medication use  Z79.899       Past Psychiatric History: I have reviewed past psychiatric history from progress note on 10/08/2021.  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  Family History:  Family History  Problem Relation Age of Onset   Depression Mother    Anxiety disorder Mother    Alcohol abuse Father    Bipolar disorder Brother    Depression Brother    Anxiety disorder Brother     Social History: Reviewed social history from progress note on 10/08/2021. Social History   Socioeconomic History   Marital status: Single    Spouse name: Not on file   Number of children: 0   Years of education: 12   Highest education level: High school graduate  Occupational History   Not on file  Tobacco Use   Smoking status: Former    Packs/day: 0.50    Years: 10.00    Pack  years: 5.00    Types: Cigarettes    Quit date: 2014    Years since quitting: 8.9   Smokeless tobacco: Never  Vaping Use   Vaping Use: Never used  Substance and Sexual Activity   Alcohol use: Not on file   Drug use: Never   Sexual activity: Not Currently  Other Topics Concern   Not on file  Social History Narrative   Not on file   Social Determinants of Health   Financial Resource Strain: Not on file  Food Insecurity: Not on file  Transportation Needs: Not on file  Physical Activity: Not on file  Stress: Not on file  Social Connections: Not on file    Allergies:  Allergies  Allergen Reactions   Naproxen Palpitations   Aripiprazole      Urinary retention  Urinary retention     Diphenhydramine Hcl     Mania   Fluphenazine    Gadobenate    Lisinopril     SOB, chest pain SOB, chest pain    Metoclopramide Other (See Comments)    Mania Mania    Ondansetron Other (See Comments)    Mania  Mania     Oxycodone-Acetaminophen     hives   Prednisone    Promethazine Other (See Comments)    Mania Mania     Metabolic Disorder Labs: No results found for: HGBA1C, MPG No results found for: PROLACTIN Lab Results  Component Value Date   CHOL 165 10/18/2021   TRIG 156 (H) 10/18/2021   HDL 36 (L) 10/18/2021   CHOLHDL 4.6 10/18/2021   VLDL 31 10/18/2021   LDLCALC 98 10/18/2021   Lab Results  Component Value Date   TSH 0.504 10/18/2021   TSH 2.60 08/10/2013    Therapeutic Level Labs: No results found for: LITHIUM Lab Results  Component Value Date   VALPROATE 13 (L) 10/18/2021   No components found for:  CBMZ  Current Medications: Current Outpatient Medications  Medication Sig Dispense Refill   atenolol (TENORMIN) 100 MG tablet Take 100 mg by mouth daily.     bosutinib (BOSULIF) 500 MG tablet Take 500 mg by mouth daily with breakfast. Take with food.     CALTRATE 600+D3 600-20 MG-MCG TABS Take 1 tablet by mouth 2 (two) times daily.     cetirizine (ZYRTEC) 10 MG tablet Take 10 mg by mouth every morning.     clobetasol (TEMOVATE) 0.05 % external solution Apply topically 2 (two) times daily as needed.     ferrous sulfate 325 (65 FE) MG EC tablet Take by mouth.     furosemide (LASIX) 20 MG tablet Take 20 mg by mouth daily.     glipiZIDE (GLUCOTROL XL) 10 MG 24 hr tablet Take 20 mg by mouth daily.     hydrochlorothiazide (HYDRODIURIL) 25 MG tablet Take by mouth.     hydrocortisone 2.5 % ointment Apply 1 application topically 2 (two) times daily.     hydrOXYzine (VISTARIL) 50 MG capsule Take 50 mg by mouth at bedtime as needed.     metFORMIN (GLUCOPHAGE) 1000 MG tablet Take by mouth.     norethindrone  (AYGESTIN) 5 MG tablet Take 5 mg by mouth every morning.     pregabalin (LYRICA) 75 MG capsule TAKE 1 CAPSULE BY MOUTH EVERY MORNING and 2 EVERY EVENING     TRESIBA FLEXTOUCH 200 UNIT/ML FlexTouch Pen Inject into the skin.     triamcinolone ointment (KENALOG) 0.1 % Apply topically.     valsartan (  DIOVAN) 160 MG tablet Take by mouth.     VICTOZA 18 MG/3ML SOPN SMARTSIG:0.3 Milliliter(s) SUB-Q Daily     divalproex (DEPAKOTE ER) 500 MG 24 hr tablet Take 1 tablet (500 mg total) by mouth at bedtime. 30 tablet 1   lamoTRIgine (LAMICTAL) 200 MG tablet Take 1 tablet (200 mg total) by mouth 2 (two) times daily. 60 tablet 1   traZODone (DESYREL) 100 MG tablet Take 2 tablets (200 mg total) by mouth at bedtime. 60 tablet 1   ziprasidone (GEODON) 80 MG capsule TAKE 1 CAPSULE BY MOUTH TWICE DAILY with food 60 capsule 1   No current facility-administered medications for this visit.     Musculoskeletal: Strength & Muscle Tone: within normal limits Gait & Station: normal Patient leans: N/A  Psychiatric Specialty Exam: Review of Systems  Constitutional:  Positive for fatigue (reports likely due to [name]).  Psychiatric/Behavioral:  Positive for sleep disturbance. The patient is nervous/anxious.   All other systems reviewed and are negative.  Blood pressure 136/81, pulse 86, temperature 98.7 F (37.1 C), temperature source Temporal, weight (!) 348 lb 12.8 oz (158.2 kg).Body mass index is 46.23 kg/m.  General Appearance: Casual  Eye Contact:  Fair  Speech:  Clear and Coherent  Volume:  Normal  Mood:  Anxious, coping well  Affect:  Congruent  Thought Process:  Goal Directed and Descriptions of Associations: Intact  Orientation:  Full (Time, Place, and Person)  Thought Content: Logical   Suicidal Thoughts:  No  Homicidal Thoughts:  No  Memory:  Immediate;   Fair Recent;   Fair Remote;   limited  Judgement:  Fair  Insight:  Fair  Psychomotor Activity:  Normal  Concentration:  Concentration: Fair  and Attention Span: Fair  Recall:  [employer] of Knowledge: Fair  Language: Fair  Akathisia:  No  Handed:  Right  AIMS (if indicated): done, 0  Assets:  Communication Skills Desire for Improvement Housing Social Support Transportation  ADL's:  Intact  Cognition: WNL  Sleep:   Restless   Screenings: Camera operator Row Office Visit from 10/31/2021 in [place] Visit from 10/08/2021 in [place]  PHQ-2 Total Score 2 3  PHQ-9 Total Score 8 13      [place] Visit from 10/31/2021 in [place] Visit from 10/08/2021 in [place] No Risk Low Risk        Assessment and Plan: Cynthia Lang is a 30 year old Caucasian female, single, on disability, lives in [place] with her mother was evaluated in office today.  Patient with multiple medical problems including [name]-leukemia, currently undergoing treatment at [facility], reports mood symptoms are currently stable on the current medication regimen and is not interested in any medication changes today.  Plan Bipolar disorder unspecified likely in remission Geodon 80 mg p.o. twice daily with a meal Lamotrigine 200 mg p.o. twice daily Depakote ER 500 mg at bedtime. Discussed with patient drug to drug interaction with lamotrigine and Depakote.  She is not interested in making any changes with her medications today.  PTSD-improving Continue psychotherapy sessions-patient reports she follows up with a therapist in [place] - Ms.Gigi Ritchie  Anxiety disorder unspecified-rule out generalized anxiety disorder-improving Continue CBT Hydroxyzine 50 mg at bedtime as needed We will consider adding an SSRI/SNRI.  High risk medication use-reviewed and discussed following labs dated 10/18/2021-TSH-within normal limits, lipid panel-triglycerides elevated at 156, HDL-low at 36  otherwise within normal limits, CMP-alkaline phosphatase elevated at 129-otherwise within normal limits, valproic acid level-low at 30, platelet count-within normal limits.  Reviewed EKG-dated 10/18/2021-normal sinus rhythm.   While in session since patient's medical records were still pending, writer [facility], verified her medications in order to provide her with refills today.  We will continue to attempt to request medical records.  Follow-up in clinic in 2 months or sooner in person.  This note was generated in part or whole with voice recognition software. Voice recognition is usually quite accurate but there are transcription errors that can and very often do occur. I apologize for any typographical errors that were not detected and corrected.     Ursula Alert, MD 11/01/2021, 1:28 PM

## 2021-11-06 MED FILL — BOSULIF 500 MG TABLET: ORAL | 30 days supply | Qty: 30 | Fill #3
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
## 2021-11-26 ENCOUNTER — Ambulatory Visit
Admit: 2021-11-26 | Discharge: 2021-11-27 | Payer: MEDICAID | Attending: Geriatric Medicine | Primary: Geriatric Medicine

## 2021-11-26 DIAGNOSIS — N920 Excessive and frequent menstruation with regular cycle: Principal | ICD-10-CM

## 2021-11-26 DIAGNOSIS — F3174 Bipolar disorder, in full remission, most recent episode manic: Principal | ICD-10-CM

## 2021-11-26 DIAGNOSIS — E119 Type 2 diabetes mellitus without complications: Principal | ICD-10-CM

## 2021-11-26 DIAGNOSIS — Z6841 Body Mass Index (BMI) 40.0 and over, adult: Principal | ICD-10-CM

## 2021-11-26 DIAGNOSIS — C9210 Chronic myeloid leukemia, BCR/ABL-positive, not having achieved remission: Principal | ICD-10-CM

## 2021-11-26 NOTE — Unmapped (Signed)
[facility] Refill Coordination Note    Specialty Medication(s) to be Shipped:   Hematology/Oncology: Bosulif    Other medication(s) to be shipped: No additional medications requested for fill at this time     Jenna Mosley, DOB: 24-Mar-1991  Phone: [phone] (home)       All above HIPAA information was verified with patient.     Was a Nurse, learning disability used for this call? No    Completed refill call assessment today to schedule patient's medication shipment from the [facility] [phone]).  All relevant notes have been reviewed.     Specialty medication(s) and dose(s) confirmed: Regimen is correct and unchanged.   Changes to medications: [name] reports starting the following medications: Furosemide started again and Humulin R u500   Changes to insurance: No  New side effects reported not previously addressed with a pharmacist or physician: None reported  Questions for the pharmacist: No    Confirmed patient received a Conservation officer, historic buildings and a Surveyor, mining with first shipment. The patient will receive a drug information handout for each medication shipped and additional FDA Medication Guides as required.       DISEASE/MEDICATION-SPECIFIC INFORMATION        N/A    SPECIALTY MEDICATION ADHERENCE     Medication Adherence    Patient reported X missed doses in the last month: 2  Specialty Medication: Bosulif 500mg   Patient is on additional specialty medications: No  Informant: patient              Were doses missed due to medication being on hold? No    Bosulif 500 mg: 13 days of medicine on hand       REFERRAL TO PHARMACIST     Referral to the pharmacist: Not needed      [facility]     Shipping address confirmed in Epic.     Delivery Scheduled: Yes, Expected medication delivery date: 12/05/21.     Medication will be delivered via Next Day Courier to the prescription address in Epic [place].    Jenna Mosley   [facility] Specialty Technician

## 2021-11-27 NOTE — Unmapped (Signed)
PHQ-9 is actually improved.  Continue current management as per psychiatry.

## 2021-11-27 NOTE — Unmapped (Signed)
Patient's Dexcom was continually going off in the clinic.  Her sugars are obviously running very very high.  She is on very high doses of insulin which are likely causing some of her weight gain.  Explained to patient that she needs to cut back on intake of calories and sugars to allow insulin dose to be cut back such that her weight will not go up.  Patient does admit to dietary indiscretions.  Would like to see her occupy herself meaningfully in the day such that she does not go to food due to boredom.  Would like to see patient eat 3 meals per day and cut back on snacking throughout the day.  Cut out liquid calories.  Incorporate exercise as tolerated however patient is aware that exercise alone will not manage her blood sugars.  She really needs to cut back on her calorie intake throughout the day.

## 2021-11-27 NOTE — Unmapped (Signed)
>>  ASSESSMENT AND PLAN FOR DIABETES MELLITUS WITHOUT COMPLICATION (CMS-HCC) WRITTEN ON 11/26/2021  5:47 PM BY ERDEM, Mauricio Po, MD    Patient's Dexcom was continually going off in the clinic.  Her sugars are obviously running very very high.  She is on very high doses of insulin which are likely causing some of her weight gain.  Explained to patient that she needs to cut back on intake of calories and sugars to allow insulin dose to be cut back such that her weight will not go up.  Patient does admit to dietary indiscretions.  Would like to see her occupy herself meaningfully in the day such that she does not go to food due to boredom.  Would like to see patient eat 3 meals per day and cut back on snacking throughout the day.  Cut out liquid calories.  Incorporate exercise as tolerated however patient is aware that exercise alone will not manage her blood sugars.  She really needs to cut back on her calorie intake throughout the day.

## 2021-11-27 NOTE — Unmapped (Signed)
Patient currently on Lupron to inhibit ovulation.  Concerned about menstrual spotting.  Explained to patient that she likely still not having ovulatory cycles because she is spotting every 2 weeks and not having menstrual periods.  Continue current regimen as per GYN.  Consider endovaginal ultrasound if symptoms continue.

## 2021-11-27 NOTE — Unmapped (Signed)
Last set of labs show good response to treatment.  Follow-up with oncology.

## 2021-12-03 MED FILL — BOSULIF 500 MG TABLET: ORAL | 30 days supply | Qty: 30 | Fill #4

## 2021-12-04 ENCOUNTER — Telehealth
Admit: 2021-12-04 | Discharge: 2021-12-05 | Payer: MEDICAID | Attending: Student in an Organized Health Care Education/Training Program | Primary: Student in an Organized Health Care Education/Training Program

## 2021-12-04 DIAGNOSIS — Z713 Dietary counseling and surveillance: Principal | ICD-10-CM

## 2021-12-04 DIAGNOSIS — Z6841 Body Mass Index (BMI) 40.0 and over, adult: Principal | ICD-10-CM

## 2021-12-04 NOTE — Unmapped (Signed)
[facility]  Behavioral Weight Management Progress Note     Appointment Format: Telehealth   This medical encounter was conducted virtually using [email or web address]  TeleHealth protocols.        The patient reports they are currently: at home. I spent 30 minutes on the real-time audio and video with the patient on the date of service. I spent an additional 30 minutes on pre- and post-visit activities on the date of service.     The patient was physically located in [place] or a state in which I am permitted to provide care. The patient and/or parent/guardian understood that s/he may incur co-pays and cost sharing, and agreed to the telemedicine visit. The visit was reasonable and appropriate under the circumstances given the patient's presentation at the time.    The patient and/or parent/guardian has been advised of the potential risks and limitations of this mode of treatment (including, but not limited to, the absence of in-person examination) and has agreed to be treated using telemedicine. The patient's/patient's family's questions regarding telemedicine have been answered.     If the visit was completed in an ambulatory setting, the patient and/or parent/guardian has also been advised to contact their provider???s office for worsening conditions, and seek emergency medical treatment and/or call 911 if the patient deems either necessary.            Intensive Behavioral Therapy for Weight Management     Jenna Mosley is a 31 y.o. female that presents for a behavioral therapy for weight management today in regards to the following diagnoses and comorbidies:  No diagnosis found.    Weight and Health Markers    As of this encounter, BMI is 47.77 kg/m^2    Weight trend:         11/26/21 (!) 159.8 kg (352 lb 4.8 oz)   10/21/21 (!) 156.7 kg (345 lb 8 oz)   10/15/21 (!) 158.9 kg (350 lb 6.4 oz)         ASSESS    PATIENT GOALS  No flowsheet data found.   - lose weight  - learn to keep motivated to stay away from carbs, and not be a graze eater     In your own words, why are you interested in weight management at this time? Lose 60 lbs, to be healthier and have clothes fit better, I want children, I have cancer and once treated - I'd like to start thinking about starting a family    DISORDERED EATING  SCOFF Questionnaire for Eating Disorders  Y/N   Do you make yourself sick because you feel uncomfortably full?   -if yes: specify if vomiting occurs  No   Do you worry that you have lost control over how much you eat? Yes   Have you recently lost more than one stone (14 lb [6.4 kg]) in a three-month period?  N/A- see question about unhealthy weight loss methods     Do you think you are too fat, even though others say you are too thin? N/A   Would you say that food dominates your life? Yes   One point for every yes answer; a score 2 indicates a likely case of eating disorder (sensitivity: 100 percent; specificity: 87.5 percent).     SCOFF Total Yes: 2 (positive screen)        Do you try to manage your weight by vomiting, using laxatives, diuretics  (including detox teas), or excessive exercise? 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  Last episode of binge eating 2011/2012.    WEIGHT HISTORY: & DIET HISTORY:  No flowsheet data found.                                                   12/04/21  Did you have weight loss surgery in the past? No, was supposed to have weight loss surgery, but kept going into renal failure, couldn't keep fluids down (on 2wk liquid diet), postponed surgery.  Went into renal failure twice and went into sepsis 1 month after renal failure, then diagnosed with cancer [name]    What was your highest adult weight? (lbs) 515 lbs (380 lbs before surgery attempt)   What was your lowest adult weight on a diet or weight loss program? (lbs) 250     >>>>>>>>>>>>>>>>>>>>>Will need to continue Assessment Review at subsequent visit.  Will refer patient to LCSW for Disordered eating.<<<<<<<<<<<<<<<<<<<<<<<<<<<<    ADVISE   Provider gave information about the [facility] Multi-disciplinary weight management collaborative and treatment options.  Provider reflected key points from patient assessment and gave menu of options for behavioral weight management support (individual sessions, Appetite Awareness Training Group, and Healthy Plate group) with recommendations for treatment.   Provider gave resources for access to food and connection to mental health services as needed.     AGREE  Patient and provider collaboratively developed the following goals:    1. Start tracking food (what eaten, when) and hunger/fullness until next visit    Treatment plan & goals:  pursue 5-10% weight loss via changes in eating habits, exercise, sleep, and stress manangement, stabilize eating patterns (eat 3-5 times/day, eating when hungry and stopping when comfortably full) and stabilize eating patterns (eat 3-5 times/day, eating when hungry and stopping when comfortably full) before explicitly pursuing weight loss     Assessment date: 12/04/2021   Assessment weight:      ASSIST  Connection to services: None at this visit    Resources given: Healthy Plate Overview, SMART goals worksheet, Intake and appetite tracking Handout, Program Guiding Principles, Patient Contract      ARRANGE/Next Steps   1) Please continue encouraging this Pt to reach their goals (see list above).   2) Patient provided with instructions on how to reach this provider during times that the office is reducing face-to-face visits due to COVID-19.   3) Patient elected to participate in: Individual counseling  4) Patient to follow up with LCSW            [facility]  [address] [place], [place] [address] ??? Telephone [phone] ??? Fax [phone]  [email or web address]

## 2021-12-04 NOTE — Unmapped (Signed)
I called patient 

## 2021-12-14 ENCOUNTER — Emergency Department: Admit: 2021-12-14 | Discharge: 2021-12-14 | Disposition: A | Discharge status: Home / Self Care | Payer: MEDICAID

## 2021-12-14 ENCOUNTER — Ambulatory Visit: Admit: 2021-12-14 | Discharge: 2021-12-14 | Disposition: A | Discharge status: Home / Self Care | Payer: MEDICAID

## 2021-12-14 DIAGNOSIS — K529 Noninfective gastroenteritis and colitis, unspecified: Principal | ICD-10-CM

## 2021-12-14 LAB — CBC W/ AUTO DIFF
BASOPHILS ABSOLUTE COUNT: 0 10*9/L (ref 0.0–0.1)
BASOPHILS RELATIVE PERCENT: 0.5 %
EOSINOPHILS ABSOLUTE COUNT: 0.2 10*9/L (ref 0.0–0.5)
EOSINOPHILS RELATIVE PERCENT: 2.2 %
HEMATOCRIT: 34.1 % (ref 34.0–44.0)
HEMOGLOBIN: 11.3 g/dL (ref 11.3–14.9)
LYMPHOCYTES ABSOLUTE COUNT: 2.2 10*9/L (ref 1.1–3.6)
LYMPHOCYTES RELATIVE PERCENT: 23.9 %
MEAN CORPUSCULAR HEMOGLOBIN CONC: 33.1 g/dL (ref 32.0–36.0)
MEAN CORPUSCULAR HEMOGLOBIN: 28.1 pg (ref 25.9–32.4)
MEAN CORPUSCULAR VOLUME: 84.7 fL (ref 77.6–95.7)
MEAN PLATELET VOLUME: 8.2 fL (ref 6.8–10.7)
MONOCYTES ABSOLUTE COUNT: 0.8 10*9/L (ref 0.3–0.8)
MONOCYTES RELATIVE PERCENT: 9 %
NEUTROPHILS ABSOLUTE COUNT: 5.9 10*9/L (ref 1.8–7.8)
NEUTROPHILS RELATIVE PERCENT: 64.4 %
NUCLEATED RED BLOOD CELLS: 0 /100{WBCs} (ref <=4)
PLATELET COUNT: 247 10*9/L (ref 150–450)
RED BLOOD CELL COUNT: 4.03 10*12/L (ref 3.95–5.13)
RED CELL DISTRIBUTION WIDTH: 14.3 % (ref 12.2–15.2)
WBC ADJUSTED: 9.1 10*9/L (ref 3.6–11.2)

## 2021-12-14 LAB — COMPREHENSIVE METABOLIC PANEL
ALBUMIN: 3.8 g/dL (ref 3.4–5.0)
ALKALINE PHOSPHATASE: 146 U/L — ABNORMAL HIGH (ref 46–116)
ALT (SGPT): 12 U/L (ref 10–49)
ANION GAP: 10 mmol/L (ref 5–14)
AST (SGOT): 19 U/L (ref <=34)
BILIRUBIN TOTAL: 0.3 mg/dL (ref 0.3–1.2)
BLOOD UREA NITROGEN: 16 mg/dL (ref 9–23)
BUN / CREAT RATIO: 16
CALCIUM: 10 mg/dL (ref 8.7–10.4)
CHLORIDE: 109 mmol/L — ABNORMAL HIGH (ref 98–107)
CO2: 17.5 mmol/L — ABNORMAL LOW (ref 20.0–31.0)
CREATININE: 0.99 mg/dL — ABNORMAL HIGH
EGFR CKD-EPI (2021) FEMALE: 78 mL/min/{1.73_m2} (ref >=60)
GLUCOSE RANDOM: 201 mg/dL — ABNORMAL HIGH (ref 70–179)
POTASSIUM: 4.9 mmol/L — ABNORMAL HIGH (ref 3.4–4.8)
PROTEIN TOTAL: 7.1 g/dL (ref 5.7–8.2)
SODIUM: 136 mmol/L (ref 135–145)

## 2021-12-14 LAB — URINALYSIS WITH MICROSCOPY WITH CULTURE REFLEX
BACTERIA: NONE SEEN /HPF
BILIRUBIN UA: NEGATIVE
BLOOD UA: NEGATIVE
GLUCOSE UA: NEGATIVE
KETONES UA: NEGATIVE
LEUKOCYTE ESTERASE UA: NEGATIVE
NITRITE UA: NEGATIVE
PH UA: 5.5 (ref 5.0–9.0)
PROTEIN UA: NEGATIVE
RBC UA: 1 /HPF (ref <=4)
SPECIFIC GRAVITY UA: 1.01 (ref 1.003–1.030)
SQUAMOUS EPITHELIAL: 1 /HPF (ref 0–5)
UROBILINOGEN UA: <2
WBC UA: 1 /HPF (ref 0–5)

## 2021-12-14 LAB — LIPASE: LIPASE: 57 U/L — ABNORMAL HIGH (ref 12–53)

## 2021-12-14 LAB — PREGNANCY, URINE: PREGNANCY TEST URINE: NEGATIVE

## 2021-12-14 LAB — GREEN LITHIUM HEPARIN EXTRA TUBE: EXTRA TUBE GREEN LI HEP: 0

## 2021-12-14 MED ORDER — DICYCLOMINE 20 MG TABLET
ORAL_TABLET | Freq: Four times a day (QID) | ORAL | 0 refills | 5 days | Status: CP | PRN
Start: 2021-12-14 — End: 2021-12-24

## 2021-12-14 MED ADMIN — sodium chloride 0.9% (NS) bolus 1,000 mL: 1000 mL | INTRAVENOUS | @ 16:00:00 | Stop: 2021-12-14

## 2021-12-14 MED ADMIN — iohexoL (OMNIPAQUE) 350 mg iodine/mL solution 100 mL: 100 mL | INTRAVENOUS | @ 18:00:00 | Stop: 2021-12-14

## 2021-12-14 MED ADMIN — dicyclomine (BENTYL) capsule 20 mg: 20 mg | ORAL | @ 16:00:00 | Stop: 2021-12-14
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
## 2021-12-14 NOTE — Unmapped (Signed)
Patient presents here with c/o lower abdominal pain associated with N/D/ fever/ chills since few days. States that it started on Tuesday and got better and then last night she started with fever and chills. H/o leukemia. Denies any urinary symptoms

## 2021-12-16 ENCOUNTER — Telehealth: Admit: 2021-12-16 | Discharge: 2021-12-17 | Payer: MEDICAID | Attending: Clinical | Primary: Clinical

## 2021-12-16 DIAGNOSIS — K760 Fatty (change of) liver, not elsewhere classified: Principal | ICD-10-CM

## 2021-12-16 DIAGNOSIS — Z6841 Body Mass Index (BMI) 40.0 and over, adult: Principal | ICD-10-CM

## 2021-12-17 NOTE — Unmapped (Addendum)
Jenna Mosley, great to see you back again! Happy to support your healthy living and weight loss efforts.  You are doing a great job of finding a healthy recipe resources online and I love your willingness and persistence to try healthy vegetables such as Brussels sprouts, kale, and beets. Amazing. You are on the right path.     1.  Please follow-up and continue with [facility] family medicine behavioral weight loss management program    2.  Our next scheduled session is for  2/27 at 4 PM. Let's keep exploring the link between your thought patterns and behaviors related to home-cooking. Your Action Plan is to (1) find a healthy recipe you want to try and (2) have all the ingredients ready.     See you then!    If you have any medical questions or emergencies, please contact our liver nurse:   Theophilus Kinds @ [phone]  Silvestre Moment @ [phone]    Please call 911 or go to your nearest emergency room if you need urgent medical or psychiatric care    You can also communicate with your liver team using MyChart. If you need help with log on or password, please contact [phone].       Cecil Cranker. Sander Radon, PhD  Clinical Health Psychologist  Professor of Medicine  [facility]   Email: [email or web address]  Office Phone: [phone] (only checked 2x/week)  Liver Program: [phone]    MyChart messaging is best way to send messages.
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
## 2021-12-24 NOTE — Unmapped (Signed)
[facility] Refill Coordination Note    Specialty Medication(s) to be Shipped:   Hematology/Oncology: Bosulif    Other medication(s) to be shipped: No additional medications requested for fill at this time     Jenna Mosley, DOB: September 05, 1991  Phone: [phone] (home)       All above HIPAA information was verified with patient.     Was a Nurse, learning disability used for this call? No    Completed refill call assessment today to schedule patient's medication shipment from the [facility] [phone]).  All relevant notes have been reviewed.     Specialty medication(s) and dose(s) confirmed: Regimen is correct and unchanged.   Changes to medications: Jenna Mosley reports no changes at this time.  Changes to insurance: No  New side effects reported not previously addressed with a pharmacist or physician: None reported  Questions for the pharmacist: No    Confirmed patient received a Conservation officer, historic buildings and a Surveyor, mining with first shipment. The patient will receive a drug information handout for each medication shipped and additional FDA Medication Guides as required.       DISEASE/MEDICATION-SPECIFIC INFORMATION        N/A    SPECIALTY MEDICATION ADHERENCE     Medication Adherence    Patient reported X missed doses in the last month: 0  Specialty Medication: Bosulif 500mg   Patient is on additional specialty medications: No  Informant: patient              Were doses missed due to medication being on hold? No    Bosulif 500 mg: 14 days of medicine on hand       REFERRAL TO PHARMACIST     Referral to the pharmacist: Not needed      [facility]     Shipping address confirmed in Epic.     Delivery Scheduled: Yes, Expected medication delivery date: 01/02/22.     Medication will be delivered via Next Day Courier to the prescription address in Epic [place].    [name] [name]   [facility] Specialty Technician
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
## 2021-12-26 ENCOUNTER — Ambulatory Visit: Payer: No Typology Code available for payment source | Admitting: Psychiatry

## 2021-12-26 ENCOUNTER — Other Ambulatory Visit: Admit: 2021-12-26 | Discharge: 2021-12-26 | Payer: MEDICAID

## 2021-12-26 ENCOUNTER — Ambulatory Visit
Admit: 2021-12-26 | Discharge: 2021-12-26 | Payer: MEDICAID | Attending: Hematology & Oncology | Primary: Hematology & Oncology

## 2021-12-26 DIAGNOSIS — C9210 Chronic myeloid leukemia, BCR/ABL-positive, not having achieved remission: Principal | ICD-10-CM

## 2021-12-26 LAB — CBC W/ AUTO DIFF
BASOPHILS ABSOLUTE COUNT: 0 10*9/L (ref 0.0–0.1)
BASOPHILS RELATIVE PERCENT: 0.4 %
EOSINOPHILS ABSOLUTE COUNT: 0.1 10*9/L (ref 0.0–0.5)
EOSINOPHILS RELATIVE PERCENT: 1.9 %
HEMATOCRIT: 34.1 % (ref 34.0–44.0)
HEMOGLOBIN: 11.2 g/dL — ABNORMAL LOW (ref 11.3–14.9)
LYMPHOCYTES ABSOLUTE COUNT: 2.4 10*9/L (ref 1.1–3.6)
LYMPHOCYTES RELATIVE PERCENT: 34.4 %
MEAN CORPUSCULAR HEMOGLOBIN CONC: 32.7 g/dL (ref 32.0–36.0)
MEAN CORPUSCULAR HEMOGLOBIN: 27.5 pg (ref 25.9–32.4)
MEAN CORPUSCULAR VOLUME: 84.1 fL (ref 77.6–95.7)
MEAN PLATELET VOLUME: 8 fL (ref 6.8–10.7)
MONOCYTES ABSOLUTE COUNT: 0.3 10*9/L (ref 0.3–0.8)
MONOCYTES RELATIVE PERCENT: 4.8 %
NEUTROPHILS ABSOLUTE COUNT: 4.2 10*9/L (ref 1.8–7.8)
NEUTROPHILS RELATIVE PERCENT: 58.5 %
PLATELET COUNT: 281 10*9/L (ref 150–450)
RED BLOOD CELL COUNT: 4.06 10*12/L (ref 3.95–5.13)
RED CELL DISTRIBUTION WIDTH: 14.3 % (ref 12.2–15.2)
WBC ADJUSTED: 7.1 10*9/L (ref 3.6–11.2)

## 2021-12-26 LAB — COMPREHENSIVE METABOLIC PANEL
ALBUMIN: 3.5 g/dL (ref 3.4–5.0)
ALKALINE PHOSPHATASE: 150 U/L — ABNORMAL HIGH (ref 46–116)
ALT (SGPT): 15 U/L (ref 10–49)
ANION GAP: 11 mmol/L (ref 5–14)
AST (SGOT): 21 U/L (ref <=34)
BILIRUBIN TOTAL: 0.3 mg/dL (ref 0.3–1.2)
BLOOD UREA NITROGEN: 20 mg/dL (ref 9–23)
BUN / CREAT RATIO: 14
CALCIUM: 9.4 mg/dL (ref 8.7–10.4)
CHLORIDE: 108 mmol/L — ABNORMAL HIGH (ref 98–107)
CO2: 22 mmol/L (ref 20.0–31.0)
CREATININE: 1.43 mg/dL — ABNORMAL HIGH
EGFR CKD-EPI (2021) FEMALE: 50 mL/min/{1.73_m2} — ABNORMAL LOW (ref >=60)
GLUCOSE RANDOM: 230 mg/dL — ABNORMAL HIGH (ref 70–179)
POTASSIUM: 4.8 mmol/L (ref 3.4–4.8)
PROTEIN TOTAL: 7.2 g/dL (ref 5.7–8.2)
SODIUM: 141 mmol/L (ref 135–145)

## 2021-12-26 NOTE — Unmapped (Addendum)
If you have questions or concerns at night or on the weekend, call the hospital operator at [phone] and ask to speak to the hematology/oncology fellow on call.     If you have questions or concerns on a week day during business hours, you may call the Nurse call line at [phone].    Lab on 12/26/2021   Component Date Value Ref Range Status    Sodium 12/26/2021 141  135 - 145 mmol/L Final    Potassium 12/26/2021 4.8  3.4 - 4.8 mmol/L Final    Chloride 12/26/2021 108 (H)  98 - 107 mmol/L Final    CO2 12/26/2021 22.0  20.0 - 31.0 mmol/L Final    Anion Gap 12/26/2021 11  5 - 14 mmol/L Final    BUN 12/26/2021 20  9 - 23 mg/dL Final    Creatinine 28/41/3244 1.43 (H)  0.60 - 0.80 mg/dL Final    BUN/Creatinine Ratio 12/26/2021 14   Final    eGFR CKD-EPI (2021) Female 12/26/2021 50 (L)  >=60 mL/min/1.45m2 Final    eGFR calculated with CKD-EPI 2021 equation in accordance with [employer] and [employer] of Nephrology Task Force recommendations.    Glucose 12/26/2021 230 (H)  70 - 179 mg/dL Final    Calcium 11/19/7251 9.4  8.7 - 10.4 mg/dL Final    Albumin 66/44/0347 3.5  3.4 - 5.0 g/dL Final    Total Protein 12/26/2021 7.2  5.7 - 8.2 g/dL Final    Total Bilirubin 12/26/2021 0.3  0.3 - 1.2 mg/dL Final    AST 42/59/5638 21  <=34 U/L Final    ALT 12/26/2021 15  10 - 49 U/L Final    Alkaline Phosphatase 12/26/2021 150 (H)  46 - 116 U/L Final    Collection 12/26/2021 Collected   Final    WBC 12/26/2021 7.1  3.6 - 11.2 10*9/L Final    RBC 12/26/2021 4.06  3.95 - 5.13 10*12/L Final    HGB 12/26/2021 11.2 (L)  11.3 - 14.9 g/dL Final    HCT 75/64/3329 34.1  34.0 - 44.0 % Final    MCV 12/26/2021 84.1  77.6 - 95.7 fL Final    MCH 12/26/2021 27.5  25.9 - 32.4 pg Final    MCHC 12/26/2021 32.7  32.0 - 36.0 g/dL Final    RDW 51/88/4166 14.3  12.2 - 15.2 % Final    MPV 12/26/2021 8.0  6.8 - 10.7 fL Final    Platelet 12/26/2021 281  150 - 450 10*9/L Final    Neutrophils % 12/26/2021 58.5  % Final    Lymphocytes % 12/26/2021 34.4  % Final    Monocytes % 12/26/2021 4.8  % Final    Eosinophils % 12/26/2021 1.9  % Final    Basophils % 12/26/2021 0.4  % Final    Absolute Neutrophils 12/26/2021 4.2  1.8 - 7.8 10*9/L Final    Absolute Lymphocytes 12/26/2021 2.4  1.1 - 3.6 10*9/L Final    Absolute Monocytes 12/26/2021 0.3  0.3 - 0.8 10*9/L Final    Absolute Eosinophils 12/26/2021 0.1  0.0 - 0.5 10*9/L Final    Absolute Basophils 12/26/2021 0.0  0.0 - 0.1 10*9/L Final

## 2021-12-26 NOTE — Unmapped (Signed)
I saw and evaluated the patient, and participated in the key portions of the service. This is a highly complex patient which requires my evaluation and assessment. I reviewed the housestaff note below and agree with the plan. Additional findings are as follows:    Jenna Mosley is a 31 y.o. 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 She started imatinib on 03/27/2021 but her BCR-ABL did not show improvement after 3 months and remained >10%. She switched to bosutinib 400 mg daily on 07/12/21 which was dose increased to 500mg  daily on 08/08/21. She presents today for follow up.    Jenna Mosley is doing well today and has been tolerating bosutinib well without toxicities or complications. She has diarrhea at baseline and surprisingly reports resolved diarrhea while being on bosutinib. BCR-ABL is pending. Her CBC today is stable. However, her CMP shows a creatinine of 1.43, ALP of 150, and a glucose of 230. She is following with endocrinology for diabetes management. She was concerned about recent menstrual spotting as she is currently on Lupron and should not be menstruating. I recommended following up with pharmacy and/or her PCP. She mentioned she will contact our pharmacy team.    Otherwise, she should continue bosutinib 500mg  daily as long as she continues to meet treatment goals through her BCR-ABL results. We will plan to continue following up every 3 months with a clinic visit and monthly BCR-ABL testing.    Covid ppx: 2 known covid vaccinations.    ECOG PS: 1    Plan and Recommendations  - continue bosutinib 500mg  daily  - f/u BCR/ABL PCR pending from today  - RTC in 3 months for follow up and repeat BCR/ABL testing.  _______________________________________________________________    Documentation assistance was provided by Roosevelt Locks, Scribe, on December 26, 2021 at 2:00 PM for Jimmye Norman, MD.    Documentation assistance provided by Medical Scribe, Roosevelt Locks, who was present during the entirety of the visit. I reviewed the note below and validated all of the information provided to ensure accuracy and completeness.     Cindra Presume, MD      History of Present Illness:  Oncology History Overview Note   Diagnosis: [name]    Bone Marrow Biopsy:  Bone marrow, right iliac, aspiration and biopsy (02/28/21)  -  Hypercellular bone marrow (>95%) involved by chronic myeloid leukemia, BCR/ABL-1-positive, chronic phase (1% blasts by manual aspirate differential)  -  No significant marrow fibrosis    Cytogenetics: Abnormal Karyotype. 46,XX,t(9;22)(q34;q11.2)[20]    Treatment: imatinib  - 03/27/21    BCR/ABL p210 06/25/21: 34.403%  BCR-ABL1 Mutation Analysis: Negative    Switched to bosutinib on 07/12/21    Bosutinib 500 mg daily- 08/09/2021    09/26/21 BCR-ABL1 PCR: 0.862%    12/26/21 BCR-ABL: pending     [name] (chronic myelocytic leukemia) (CMS-HCC)   02/19/2021 Initial Diagnosis    [name] (chronic myelocytic leukemia) (CMS-HCC)     04/23/2021 Endocrine/Hormone Therapy    OP LEUPROLIDE (LUPRON) 11.25 MG EVERY 3 MONTHS  Plan Provider: Pernell Dupre, MD       Cindra Presume, MD

## 2021-12-26 NOTE — Unmapped (Signed)
Phlebotomy drawn by Rowe Pavy RN. 23g LFA.  Labs drawn and sent for analysis.

## 2021-12-31 ENCOUNTER — Ambulatory Visit (INDEPENDENT_AMBULATORY_CARE_PROVIDER_SITE_OTHER): Payer: No Typology Code available for payment source | Admitting: Psychiatry

## 2021-12-31 ENCOUNTER — Other Ambulatory Visit: Payer: Self-pay

## 2021-12-31 ENCOUNTER — Encounter: Payer: Self-pay | Admitting: Psychiatry

## 2021-12-31 VITALS — BP 126/84 | HR 96 | Temp 98.5°F | Wt 344.8 lb

## 2021-12-31 DIAGNOSIS — F3174 Bipolar disorder, in full remission, most recent episode manic: Secondary | ICD-10-CM

## 2021-12-31 DIAGNOSIS — F411 Generalized anxiety disorder: Secondary | ICD-10-CM | POA: Insufficient documentation

## 2021-12-31 DIAGNOSIS — F4310 Post-traumatic stress disorder, unspecified: Secondary | ICD-10-CM

## 2021-12-31 MED ORDER — DIVALPROEX SODIUM ER 500 MG PO TB24: 500.0000 mg | ORAL_TABLET | Freq: Every day | ORAL | 3 refills | Status: DC

## 2021-12-31 MED ORDER — LAMOTRIGINE 200 MG PO TABS: 200.0000 mg | ORAL_TABLET | Freq: Two times a day (BID) | ORAL | 3 refills | Status: DC

## 2021-12-31 MED ORDER — ZIPRASIDONE HCL 80 MG PO CAPS: ORAL_CAPSULE | ORAL | 3 refills | Status: DC

## 2021-12-31 MED ORDER — BUSPIRONE HCL 10 MG PO TABS: 10.0000 mg | ORAL_TABLET | Freq: Two times a day (BID) | ORAL | 1 refills | Status: DC

## 2021-12-31 MED ORDER — TRAZODONE HCL 100 MG PO TABS: 200.0000 mg | ORAL_TABLET | Freq: Every day | ORAL | 3 refills | Status: DC

## 2021-12-31 NOTE — Progress Notes (Signed)
[facility] MD OP Progress Note  12/31/2021 3:33 PM Cynthia Lang  MRN:  [id number]  Chief Complaint:  Chief Complaint  Patient presents with   Follow-up 31 year old Caucasian female who has a history of bipolar disorder, PTSD, anxiety disorder, presented for medication management.   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  Patient today reports she does not feel depressed however continues to be anxious.  She constantly worries about different things, has trouble relaxing, is often restless, has catastrophic thinking.  This has been going on since the past several months.  She is currently on multiple medications however reports she continues to worry.  She is following up with a therapist and reports therapy sessions are helpful.  She was referred for peer support by her therapist, she however reports she was not interested in that.  She does not really appear to support is going to help her.  Patient reports sleep at night is good.  She however reports due to her multiple medical problems she feels tired during the day and and does take naps during the day.  Patient denies any suicidality, homicidality or perceptual disturbances.  Currently compliant on medications.  Denies side effects.  Patient denies any other concerns today.  Visit Diagnosis:    ICD-10-CM   1. Bipolar 1 disorder, manic, full remission (HCC)  F31.74 ziprasidone (GEODON) 80 MG capsule    traZODone (DESYREL) 100 MG tablet    lamoTRIgine (LAMICTAL) 200 MG tablet    divalproex (DEPAKOTE ER) 500 MG 24 hr tablet    2. PTSD (post-traumatic stress disorder)  F43.10 traZODone (DESYREL) 100 MG tablet    lamoTRIgine (LAMICTAL) 200 MG tablet    busPIRone (BUSPAR) 10 MG tablet    3. GAD (generalized  anxiety disorder)  F41.1 busPIRone (BUSPAR) 10 MG tablet      Past Psychiatric History: I have reviewed past psychiatric history from progress note on 10/08/2021.  Past Medical History:  Past Medical History:  Diagnosis Date   Depression    Diabetes mellitus, type II ([place])    Hypertension    Leukemia, chronic myeloid ([place]) 02/20/2021   PCOS (polycystic ovarian syndrome)    PTSD (post-traumatic stress disorder)     Past Surgical History:  Procedure Laterality Date   TONSILLECTOMY AND ADENOIDECTOMY     WISDOM TOOTH EXTRACTION      Family Psychiatric History: Reviewed family psychiatric history from progress note on 10/08/2021.  Family History:  Family History  Problem Relation Age of Onset   Depression Mother    Anxiety disorder Mother    Alcohol abuse Father    Bipolar disorder Brother    Depression Brother    Anxiety disorder Brother     Social History: Reviewed social history from progress note on 10/08/2021. Social History   Socioeconomic History   Marital status: Single    Spouse name: Not on file   Number of children: 0   Years of education: 12   Highest education level: High school graduate  Occupational History   Not on file  Tobacco Use   Smoking status: Former    Packs/day: 0.50    Years: 10.00    Pack years: 5.00    Types: Cigarettes    Quit date: 2014    Years since quitting: 9.1  Smokeless tobacco: Never  Vaping Use   Vaping Use: Never used  Substance and Sexual Activity   Alcohol use: Not on file   Drug use: Never   Sexual activity: Not Currently  Other Topics Concern   Not on file  Social History Narrative   Not on file   Social Determinants of Health   Financial Resource Strain: Not on file  Food Insecurity: Not on file  Transportation Needs: Not on file  Physical Activity: Not on file  Stress: Not on file  Social Connections: Not on file    Allergies:  Allergies  Allergen Reactions   Naproxen Palpitations   Aripiprazole      Urinary retention  Urinary retention     Diphenhydramine Hcl     Mania   Fluphenazine    Gadobenate    Lisinopril     SOB, chest pain SOB, chest pain    Metoclopramide Other (See Comments)    Mania Mania    Ondansetron Other (See Comments)    Mania  Mania     Oxycodone-Acetaminophen     hives   Prednisone    Promethazine Other (See Comments)    Mania Mania     Metabolic Disorder Labs: No results found for: HGBA1C, MPG No results found for: PROLACTIN Lab Results  Component Value Date   CHOL 165 10/18/2021   TRIG 156 (H) 10/18/2021   HDL 36 (L) 10/18/2021   CHOLHDL 4.6 10/18/2021   VLDL 31 10/18/2021   LDLCALC 98 10/18/2021   Lab Results  Component Value Date   TSH 0.504 10/18/2021   TSH 2.60 08/10/2013    Therapeutic Level Labs: No results found for: LITHIUM Lab Results  Component Value Date   VALPROATE 13 (L) 10/18/2021   No components found for:  CBMZ  Current Medications: Current Outpatient Medications  Medication Sig Dispense Refill   atenolol (TENORMIN) 100 MG tablet Take 100 mg by mouth daily.     bosutinib (BOSULIF) 500 MG tablet Take 500 mg by mouth daily with breakfast. Take with food.     busPIRone (BUSPAR) 10 MG tablet Take 1 tablet (10 mg total) by mouth 2 (two) times daily. Take 1 tablet daily AM and 1 tablet daily at 2 PM 60 tablet 1   CALTRATE 600+D3 600-20 MG-MCG TABS Take 1 tablet by mouth 2 (two) times daily.     cetirizine (ZYRTEC) 10 MG tablet Take 10 mg by mouth every morning.     clobetasol (TEMOVATE) 0.05 % external solution Apply topically 2 (two) times daily as needed.     ferrous sulfate 325 (65 FE) MG EC tablet Take by mouth.     furosemide (LASIX) 20 MG tablet Take 20 mg by mouth daily.     glipiZIDE (GLUCOTROL XL) 10 MG 24 hr tablet Take 20 mg by mouth daily.     HUMULIN R U-500 KWIKPEN 500 UNIT/ML KwikPen Inject into the skin.     hydrochlorothiazide (HYDRODIURIL) 25 MG tablet Take by mouth.     hydrocortisone 2.5 %  ointment Apply 1 application topically 2 (two) times daily.     hydrOXYzine (VISTARIL) 50 MG capsule Take 50 mg by mouth at bedtime as needed.     metFORMIN (GLUCOPHAGE) 1000 MG tablet Take by mouth.     norethindrone (AYGESTIN) 5 MG tablet Take 5 mg by mouth every morning.     NOVOFINE PEN NEEDLE 32G X 6 MM MISC Inject into the skin 3 (three) times daily.  pantoprazole (PROTONIX) 40 MG tablet Take 40 mg by mouth every morning.     pregabalin (LYRICA) 75 MG capsule TAKE 1 CAPSULE BY MOUTH EVERY MORNING and 2 EVERY EVENING     TRESIBA FLEXTOUCH 200 UNIT/ML FlexTouch Pen Inject into the skin.     triamcinolone ointment (KENALOG) 0.1 % Apply topically.     valsartan (DIOVAN) 160 MG tablet Take by mouth.     VICTOZA 18 MG/3ML SOPN SMARTSIG:0.3 Milliliter(s) SUB-Q Daily     ACCU-CHEK GUIDE test strip CHECK SUGARS BEFORE MEALS 3 TIMES DAILY FOR INSULIN DEPENDANT TYPE TWO DIABETES.     Continuous Blood Gluc Sensor (DEXCOM G6 SENSOR) MISC SMARTSIG:1 Topical Every 10 Days (Patient not taking: Reported on 12/31/2021)     divalproex (DEPAKOTE ER) 500 MG 24 hr tablet Take 1 tablet (500 mg total) by mouth at bedtime. 30 tablet 3   lamoTRIgine (LAMICTAL) 200 MG tablet Take 1 tablet (200 mg total) by mouth 2 (two) times daily. 60 tablet 3   loratadine (CLARITIN) 10 MG tablet Take by mouth. (Patient not taking: Reported on 12/31/2021)     traZODone (DESYREL) 100 MG tablet Take 2 tablets (200 mg total) by mouth at bedtime. 60 tablet 3   ziprasidone (GEODON) 80 MG capsule TAKE 1 CAPSULE BY MOUTH TWICE DAILY with food 60 capsule 3   No current facility-administered medications for this visit.     Musculoskeletal: Strength & Muscle Tone: within normal limits Gait & Station: normal Patient leans: N/A  Psychiatric Specialty Exam: Review of Systems  Constitutional:  Positive for fatigue.  Psychiatric/Behavioral:  The patient is nervous/anxious.   All other systems reviewed and are negative.  Blood  pressure 126/84, pulse 96, temperature 98.5 F (36.9 C), temperature source Temporal, weight (!) 344 lb 12.8 oz (156.4 kg).Body mass index is 45.7 kg/m.  General Appearance: Casual  Eye Contact:  Fair  Speech:  Normal Rate  Volume:  Normal  Mood:  Anxious  Affect:  Congruent  Thought Process:  Goal Directed and Descriptions of Associations: Intact  Orientation:  Full (Time, Place, and Person)  Thought Content: Logical   Suicidal Thoughts:  No  Homicidal Thoughts:  No  Memory:  Immediate;   Fair Recent;   Fair Remote;   Fair  Judgement:  Fair  Insight:  Fair  Psychomotor Activity:  Normal  Concentration:  Concentration: Fair and Attention Span: Fair  Recall:  [employer] of Knowledge: Fair  Language: Fair  Akathisia:  No  Handed:  Right  AIMS (if indicated): done,0  Assets:  Communication Skills Desire for Improvement Housing Social Support  ADL's:  Intact  Cognition: WNL  Sleep:  Fair excessive at times   Screenings: [facility] Office Visit from 12/31/2021 in [facility]  Total GAD-7 Score 12      PHQ2-9    [place] Visit from 12/31/2021 in [facility] Visit from 10/31/2021 in [facility] Visit from 10/08/2021 in [facility]  PHQ-2 Total Score 0 2 3  PHQ-9 Total Score -- 8 [place] Visit from 12/31/2021 in [facility] Visit from 10/31/2021 in [facility] Office Visit from 10/08/2021 in [facility] No Risk No Risk Low Risk        Assessment and Plan: Cynthia Lang is a 31 year old Caucasian female, single, on  disability, lives in [place] with her mother was evaluated in office today.  Patient continues to struggle with anxiety, will benefit from the following  plan.  Plan  Bipolar disorder in remission Geodon 80 mg p.o. twice daily. Lamotrigine 200 mg p.o. twice daily Depakote ER 500 mg at bedtime Depakote level on 10/18/2021-13-subtherapeutic.  Patient on multiple mood stabilizers, will not make medication dose readjustment.  PTSD-improving Continue psychotherapy sessions with therapist-Ms. Gigi Ritchie   GAD-unstable Start BuSpar 10 mg p.o. twice daily Continue CBT  Follow-up in clinic in 4 weeks or sooner in person.   Collaboration of Care: Collaboration of Care: Referral or follow-up with counselor/therapist [facility] as noted above Patient/Guardian was advised Release of Information must be obtained prior to any record release in order to collaborate their care with an outside provider. Patient/Guardian was advised if they have not already done so to contact the registration department to sign all necessary forms in order for [place] to release information regarding their care.  Consent: Patient/Guardian gives verbal consent for treatment and assignment of benefits for services provided during this telehealth visit. Patient/Guardian expressed understanding and agreed to proceed.   This note was generated in part or whole with voice recognition software. Voice recognition is usually quite accurate but there are transcription errors that can and very often do occur. I apologize for any typographical errors that were not detected and corrected.      Ursula Alert, MD 01/01/2022, 8:22 AM

## 2021-12-31 NOTE — Patient Instructions (Signed)
Buspirone Tablets What is this medication? BUSPIRONE (byoo SPYE rone) treats anxiety. It works by balancing the levels of dopamine and serotonin in your brain, hormones that help regulate mood. This medicine may be used for other purposes; ask your health care provider or pharmacist if you have questions. COMMON BRAND NAME(S): BuSpar, Buspar Dividose What should I tell my care team before I take this medication? They need to know if you have any of these conditions: Kidney or liver disease An unusual or allergic reaction to buspirone, other medications, foods, dyes, or preservatives Pregnant or trying to get pregnant Breast-feeding How should I use this medication? Take this medication by mouth with a glass of water. Follow the directions on the prescription label. You may take this medication with or without food. To ensure that this medication always works the same way for you, you should take it either always with or always without food. Take your doses at regular intervals. Do not take your medication more often than directed. Do not stop taking except on the advice of your care team. Talk to your care team about the use of this medication in children. Special care may be needed. Overdosage: If you think you have taken too much of this medicine contact a poison control center or emergency room at once. NOTE: This medicine is only for you. Do not share this medicine with others. What if I miss a dose? If you miss a dose, take it as soon as you can. If it is almost time for your next dose, take only that dose. Do not take double or extra doses. What may interact with this medication? Do not take this medication with any of the following: Linezolid MAOIs like Carbex, Eldepryl, Marplan, Nardil, and Parnate Methylene blue Procarbazine This medication may also interact with the following: Diazepam Digoxin Diltiazem Erythromycin Grapefruit juice Haloperidol Medications for mental  depression or mood problems Medications for seizures like carbamazepine, phenobarbital and phenytoin Nefazodone Other medications for anxiety Rifampin Ritonavir Some antifungal medications like itraconazole, ketoconazole, and voriconazole Verapamil Warfarin This list may not describe all possible interactions. Give your health care provider a list of all the medicines, herbs, non-prescription drugs, or dietary supplements you use. Also tell them if you smoke, drink alcohol, or use illegal drugs. Some items may interact with your medicine. What should I watch for while using this medication? Visit your care team for regular checks on your progress. It may take 1 to 2 weeks before your anxiety gets better. You may get drowsy or dizzy. Do not drive, use machinery, or do anything that needs mental alertness until you know how this medication affects you. Do not stand or sit up quickly, especially if you are an older patient. This reduces the risk of dizzy or fainting spells. Alcohol can make you more drowsy and dizzy. Avoid alcoholic drinks. What side effects may I notice from receiving this medication? Side effects that you should report to your care team as soon as possible: Allergic reactions--skin rash, itching, hives, swelling of the face, lips, tongue, or throat Irritability, confusion, fast or irregular heartbeat, muscle stiffness, twitching muscles, sweating, high fever, seizure, chills, vomiting, diarrhea, which may be signs of serotonin syndrome Side effects that usually do not require medical attention (report to your care team if they continue or are bothersome): Anxiety or nervousness Dizziness Drowsiness Headache Nausea Trouble sleeping This list may not describe all possible side effects. Call your doctor for medical advice about side effects. You may report  side effects to FDA at 1-800-FDA-1088. Where should I keep my medication? Keep out of the reach of children. Store at room  temperature below 30 degrees C (86 degrees F). Protect from light. Keep container tightly closed. Throw away any unused medication after the expiration date. NOTE: This sheet is a summary. It may not cover all possible information. If you have questions about this medicine, talk to your doctor, pharmacist, or health care provider.  2022 Elsevier/Gold Standard (2021-01-31 00:00:00)

## 2022-01-01 MED FILL — BOSULIF 500 MG TABLET: ORAL | 30 days supply | Qty: 30 | Fill #5

## 2022-01-01 NOTE — Unmapped (Signed)
Jenna Mosley is a 31 yo F with [name] seen in clinic with Dr. Vertell Limber. I spoke to her regarding menstrual bleeding. She reported spotting at recent clinic visit but reports since then she has not had any further spotting. Just spotting and no menstrual period. She will continue with Lupron  q 21mo and Track menstrual bleeding and norethindrone daily and log menstrual cycle and will keep team posted of any bleeding. Reached out to PCP to ask if she can receive Lupron there but unable to do so. Will schedule next Lupron with [place].     Ronnald Collum, PharmD, BCOP, CPP  Hematology/Oncology Clinical Pharmacist  Pager [phone]

## 2022-01-03 DIAGNOSIS — F315 Bipolar disorder, current episode depressed, severe, with psychotic features: Principal | ICD-10-CM

## 2022-01-07 NOTE — Unmapped (Addendum)
-----   Message from Seabron Spates, RN sent at 01/07/2022 10:22 AM EST -----  Regarding: RE: Lupron  Hi Dr. Chanetta Marshall,    Sorry I missed the initial message. We have never worked with this type of injection, so I think the short answer is no. The clinical team would need additional training and we would need to figure out the protocol for the patient-supplied medication. If you want to consider this going forward, we will need to discuss with the provider team and the clinical education team before implementation.    ----- Message -----  From: Kemiya Batdorf Joeseph Amor, MD  Sent: 01/06/2022   8:20 AM EST  To: Seabron Spates, RN, Juleen Starr, LPN  Subject: FW: Lupron                                       Can we give lupron injections in the clinic?      Willia Craze, MD MPH??  [facility]  [facility]  [address] [place], [place] [address]  p [phone]    ----- Message -----  From: Hinton Lovely, CPP  Sent: 01/01/2022   2:02 PM EST  To: Leodis Liverpool, #  Subject: Lupron                                           Hi Dr. Chanetta Marshall,     This is one of our mutual patients with [name] who has been receiving lupron for fertility prolongation (and she is on norethindrone for bone support).     We were wondering if you would be comfortable having your clinic administer her lupron injections? She is on 3 mo injection, next due ~3/2. It would be a bit easier for her to get it done with you but let us know if any issues!     Thanks,   [employer]

## 2022-01-09 NOTE — Unmapped (Signed)
Immediately after or during the visit, I reviewed with the resident the medical history and the resident???s findings on physical examination.  I discussed with the resident the patient???s diagnosis and concur with the treatment plan as documented in the resident note. Evette Cristal, MD
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
## 2022-01-14 ENCOUNTER — Ambulatory Visit: Admit: 2022-01-14 | Discharge: 2022-01-15 | Payer: MEDICAID

## 2022-01-14 DIAGNOSIS — E1165 Type 2 diabetes mellitus with hyperglycemia: Principal | ICD-10-CM

## 2022-01-14 DIAGNOSIS — E782 Mixed hyperlipidemia: Principal | ICD-10-CM

## 2022-01-14 DIAGNOSIS — Z794 Long term (current) use of insulin: Principal | ICD-10-CM

## 2022-01-14 DIAGNOSIS — E119 Type 2 diabetes mellitus without complications: Principal | ICD-10-CM

## 2022-01-14 MED ORDER — HUMULIN R U-500 (CONC) INSULIN KWIKPEN 500 UNIT/ML (3 ML) SUBCUTANEOUS
Freq: Two times a day (BID) | SUBCUTANEOUS | 3 refills | 90 days | Status: CP
Start: 2022-01-14 — End: 2023-01-14

## 2022-01-14 MED ORDER — LIRAGLUTIDE 0.6 MG/0.1 ML (18 MG/3 ML) SUBCUTANEOUS PEN INJECTOR
Freq: Every day | SUBCUTANEOUS | 3 refills | 90 days | Status: CP
Start: 2022-01-14 — End: 2023-01-14

## 2022-01-14 MED ORDER — METFORMIN 1,000 MG TABLET
ORAL_TABLET | Freq: Two times a day (BID) | ORAL | 3 refills | 90 days | Status: CP
Start: 2022-01-14 — End: 2023-01-14

## 2022-01-14 NOTE — Unmapped (Signed)
Meter and pump downloaded. POC glucose and A1C done today. PP 2pm. 146 mg/dL.
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
## 2022-01-14 NOTE — Unmapped (Signed)
Continue Humulin R U 500 130 units every morning and 130 units before dinner. If you have more frequent nocturnal/fasting low sugar please dinner dose by 10 units until hypoglycemia episodes stop.   Continue victoza and metformin same dose.

## 2022-01-15 ENCOUNTER — Ambulatory Visit: Admit: 2022-01-15 | Payer: MEDICAID
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
## 2022-01-16 ENCOUNTER — Institutional Professional Consult (permissible substitution): Admit: 2022-01-16 | Discharge: 2022-01-17 | Payer: MEDICAID

## 2022-01-16 DIAGNOSIS — C9210 Chronic myeloid leukemia, BCR/ABL-positive, not having achieved remission: Principal | ICD-10-CM

## 2022-01-16 MED ADMIN — leuprolide (LUPRON) injection 11.25 mg: 11.25 mg | INTRAMUSCULAR | @ 18:00:00 | Stop: 2022-01-16

## 2022-01-16 MED ADMIN — leuprolide (LUPRON) 11.25 mg injection: INTRAMUSCULAR | @ 18:00:00 | Stop: 2022-01-16

## 2022-01-16 NOTE — Unmapped (Addendum)
Administered Lupron to left upper ventral gluteal. Gauze and bandaid applied. Patient tolerated well.  Patient is requesting to receive her injections at [facility] because it is closer to her.

## 2022-01-24 DIAGNOSIS — C9210 Chronic myeloid leukemia, BCR/ABL-positive, not having achieved remission: Principal | ICD-10-CM

## 2022-01-24 MED ORDER — BOSUTINIB 500 MG TABLET
ORAL_TABLET | Freq: Every day | ORAL | 5 refills | 30 days | Status: CP
Start: 2022-01-24 — End: ?
  Filled 2022-01-30: qty 30, 30d supply, fill #0

## 2022-01-24 NOTE — Unmapped (Signed)
[facility] Clinical Assessment & Refill Coordination Note    Jenna Mosley, DOB: 1991/08/12  Phone: [phone] (home)     All above HIPAA information was verified with patient.     Was a Nurse, learning disability used for this call? No    Specialty Medication(s):   Hematology/Oncology: Bosulif     Current Outpatient Medications   Medication Sig Dispense Refill   ??? acetone, urine, test (KETONE URINE TEST) Strp Dispense 100.  Use as needed. (Patient taking differently: Dispense 100.  Use as needed.) 100 strip 2   ??? atenoloL (TENORMIN) 100 MG tablet Take 1 tablet (100 mg total) by mouth in the morning. 90 tablet 3   ??? blood sugar diagnostic (ACCU-CHEK GUIDE TEST STRIPS) Strp Check sugars before meals three times for insulin dependent type two diabetes. 100 each 11   ??? blood-glucose meter kit Use as instructed - pt prefers a larger monitor glucometer is available 1 each 0   ??? blood-glucose meter,continuous (DEXCOM G6 RECEIVER) Misc Dispense DexCom G6 Receiver 1 each 3   ??? blood-glucose sensor (DEXCOM G6 SENSOR) Devi Dispense DexCom G6 sensors; Use 1 sensor q 10 days 3 each 11   ??? blood-glucose transmitter (DEXCOM G6 TRANSMITTER) Devi DexCom G6 transmitter; Use 1 every 90 days 1 each 3   ??? bosutinib 500 mg Tab Take 1 tablet (500 mg total) by mouth daily. 30 tablet 5   ??? calcium carbonate-vitamin D3 600 mg-20 mcg (800 unit) Tab Take 1 mg by mouth Two (2) times a day (at 8am and 12:00).     ??? cetirizine (ZYRTEC) 10 MG tablet Take 1 tablet (10 mg total) by mouth in the morning. 90 tablet 3   ??? clobetasoL (TEMOVATE) 0.05 % external solution Apply topically Two (2) times a day. For scaling or itching in scalp as needed 50 mL 4   ??? divalproex ER (DEPAKOTE ER) 500 MG extended released 24 hr tablet TAKE 1 TABLET BY MOUTH AT BEDTIME 90 tablet 3   ??? ferrous sulfate 325 (65 FE) MG tablet Take 1 tablet by mouth daily.     ??? furosemide (LASIX) 20 MG tablet Take 1 tablet (20 mg total) by mouth daily. 30 tablet 11   ??? hydroCHLOROthiazide (HYDRODIURIL) 25 MG tablet Take 1 tablet (25 mg total) by mouth in the morning. 90 tablet 3   ??? hydrocortisone 2.5 % ointment Apply 1 application topically Two (2) times a day. To face until clear, then stop. 30 g 3   ??? hydrOXYzine (VISTARIL) 50 MG capsule TAKE 1 CAPSULE BY MOUTH AT BEDTIME AS NEEDED TO IMPROVE SLEEP 90 capsule 3   ??? insulin regular hum U-500 conc (HUMULIN R U-500, CONC, KWIKPEN) 500 unit/mL (3 mL) CONCENTRATED injection Inject 130 Units under the skin Two (2) times a day (30 minutes before a meal). 46.8 mL 3   ??? lamoTRIgine (LAMICTAL) 200 MG tablet TAKE 1 TABLET BY MOUTH TWICE DAILY 180 tablet 3   ??? lancets (ACCU-CHEK SOFTCLIX LANCETS) Misc Check sugar three times per day before meals for insulin dependent type two diabetes.  E11.65 100 each 11   ??? liraglutide (VICTOZA) injection pen Inject 0.3 mL (1.8 mg total) under the skin daily. 27 mL 3   ??? metFORMIN (GLUCOPHAGE) 1000 MG tablet Take 1 tablet (1,000 mg total) by mouth in the morning and 1 tablet (1,000 mg total) in the evening. Take with meals. 180 tablet 3   ??? norethindrone (AYGESTIN) 5 mg tablet Take 1  tablet (5 mg total) by mouth in the morning. 30 tablet 11   ??? pregabalin (LYRICA) 75 MG capsule TAKE 1 CAPSULE BY MOUTH EVERY MORNING and 2 EVERY EVENING 270 capsule 3   ??? traZODone (DESYREL) 100 MG tablet TAKE 2 TABLETS BY MOUTH AT BEDTIME 180 tablet 3   ??? triamcinolone (KENALOG) 0.1 % ointment Apply topically Two (2) times a day. To your abdomen until clear, then stop. AVOID FACE 80 g 3   ??? valsartan (DIOVAN) 160 MG tablet Take 1 tablet (160 mg total) by mouth daily. 90 tablet 3   ??? ziprasidone (GEODON) 80 MG capsule Take 80 mg by mouth 2 (two) times a day with meals.        No current facility-administered medications for this visit.        Changes to medications: Valari reports no changes at this time.    Allergies   Allergen Reactions   ??? Lisinopril Shortness Of Breath   ??? Naproxen Nausea Only, Palpitations and Other (See Comments)     Chest palpitations and feels like flying   ??? Aripiprazole Other (See Comments)     Inability to urinate     ??? Fluphenazine      mental health problems   ??? Lactase      Other reaction(s): Unknown   ??? Metoclopramide      Mania   ??? Prednisone Other (See Comments)     mania   ??? Reglan [Metoclopramide Hcl] Other (See Comments)     Induces mania   ??? Diphenhydramine Hcl Anxiety   ??? Multihance [Gadobenate Dimeglumine] Nausea And Vomiting   ??? Ondansetron Hcl Anxiety   ??? Promethazine Anxiety       Changes to allergies: No    SPECIALTY MEDICATION ADHERENCE     Bosulif  500 mg: 12 days of medicine on hand     Medication Adherence    Patient reported X missed doses in the last month: >5  Specialty Medication: Bosulif 500 mg - 1 tab daily  Patient is on additional specialty medications: No  Informant: patient  Reasons for non-adherence: psychosocial   Other non-adherence reason: going through a phase of depression   Confirmed plan for next specialty medication refill: delivery by pharmacy  Refills needed for supportive medications: not needed          Specialty medication(s) dose(s) confirmed: Regimen is correct and unchanged.     Are there any concerns with adherence? Yicel reported going through a patch of depression and missed 5 dose.  It was difficult getting out of bed and remembering to take medication.  She reports being better now and hasn't missed a dose in 2 weeks.  She endorses seeing a psychiatrist.  She was enouraged to reach out for assistance if needed.    Adherence counseling provided? See note above    CLINICAL MANAGEMENT AND INTERVENTION      Clinical Benefit Assessment:    Do you feel the medicine is effective or helping your condition? Yes    Clinical Benefit counseling provided? Not needed    Adverse Effects Assessment:    Are you experiencing any side effects? No    Are you experiencing difficulty administering your medicine? 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 For example, brushing your teeth or getting up in the morning. 0    Have you discussed this with your provider? Not needed    Acute Infection Status:    Acute infections noted within Epic:  No active infections  Patient reported infection: None    Therapy Appropriateness:    Is therapy appropriate and patient progressing towards therapeutic goals? Yes, therapy is appropriate and should be continued    DISEASE/MEDICATION-SPECIFIC INFORMATION      N/A    PATIENT SPECIFIC NEEDS     - Does the patient have any physical, cognitive, or cultural barriers? No    - Is the patient high risk? Yes, patient is taking oral chemotherapy. Appropriateness of therapy as been assessed    - Does the patient require a Care Management Plan? No     SOCIAL DETERMINANTS OF HEALTH     At the [facility], we have learned that life circumstances - like trouble affording food, housing, utilities, or transportation can affect the health of many of our patients.   That is why we wanted to ask: are you currently experiencing any life circumstances that are negatively impacting your health and/or quality of life? No    Social Determinants of Health     Food Insecurity: Not on file   Tobacco Use: Medium Risk   ??? Smoking Tobacco Use: Former   ??? Smokeless Tobacco Use: Never   ??? Passive Exposure: Not on file   Transportation Needs: Not on file   Alcohol Use: Not At Risk   ??? How often do you have a drink containing alcohol?: Never   ??? How many drinks containing alcohol do you have on a typical day when you are drinking?: Not on file   ??? How often do you have 5 or more drinks on one occasion?: Not on file   Housing/Utilities: Unknown   ??? Within the past 12 months, have you ever stayed: outside, in a car, in a tent, in an overnight shelter, or temporarily in someone else's home (i.e. couch-surfing)?: No   ??? Are you worried about losing your housing?: Not on file   ??? Within the past 12 months, have you been unable to get utilities (heat, electricity) when it was really needed?: Not on file   Substance Use: Not on file   Financial Resource Strain: Not on file   Physical Activity: Not on file   Health Literacy: Medium Risk   ??? : Sometimes   Stress: Not on file   Intimate Partner Violence: Not on file   Depression: Not at risk   ??? PHQ-2 Score: 2   Social Connections: Not on file       Would you be willing to receive help with any of the needs that you have identified today? Not applicable       SHIPPING     Specialty Medication(s) to be Shipped:   Hematology/Oncology: Bosulif    Other medication(s) to be shipped: No additional medications requested for fill at this time     Changes to insurance: No    Delivery Scheduled: Yes, Expected medication delivery date: 01/31/22.     Medication will be delivered via Next Day Courier to the confirmed prescription address in [facility].    The patient will receive a drug information handout for each medication shipped and additional FDA Medication Guides as required.  Verified that patient has previously received a Conservation officer, historic buildings and a Technical sales engineer  Practices.    The patient or caregiver noted above participated in the development of this care plan and knows that they can request review of or adjustments to the care plan at any time.      All of the patient's questions and concerns have been addressed.    Breck Coons [facility] Specialty Pharmacist

## 2022-01-29 ENCOUNTER — Ambulatory Visit: Payer: No Typology Code available for payment source | Admitting: Psychiatry

## 2022-01-31 ENCOUNTER — Encounter: Payer: Self-pay | Admitting: Psychiatry

## 2022-01-31 ENCOUNTER — Other Ambulatory Visit: Payer: Self-pay

## 2022-01-31 ENCOUNTER — Telehealth (INDEPENDENT_AMBULATORY_CARE_PROVIDER_SITE_OTHER): Payer: No Typology Code available for payment source | Admitting: Psychiatry

## 2022-01-31 DIAGNOSIS — F3174 Bipolar disorder, in full remission, most recent episode manic: Secondary | ICD-10-CM | POA: Diagnosis not present

## 2022-01-31 DIAGNOSIS — F4310 Post-traumatic stress disorder, unspecified: Secondary | ICD-10-CM

## 2022-01-31 DIAGNOSIS — F411 Generalized anxiety disorder: Secondary | ICD-10-CM

## 2022-01-31 MED ORDER — HYDROXYZINE PAMOATE 50 MG PO CAPS: 50.0000 mg | ORAL_CAPSULE | Freq: Every evening | ORAL | 2 refills | Status: DC | PRN

## 2022-01-31 NOTE — Progress Notes (Signed)
Virtual Visit via Video Lang ? ?I connected with [name] on 01/31/22 at 11:20 AM EDT by a video enabled telemedicine application and verified that I am speaking with the correct person using two identifiers. ? ?Location ?Provider Location : [facility] ?Patient Location : Home ? ?Participants: Patient , Provider ? ?  ?I discussed the limitations of evaluation and management by telemedicine and the availability of in person appointments. The patient expressed understanding and agreed to proceed. ?  ?I discussed the assessment and treatment plan with the patient. The patient was provided an opportunity to ask questions and all were answered. The patient agreed with the plan and demonstrated an understanding of the instructions. ?  ?The patient was advised to call back or seek an in-person evaluation if the symptoms worsen or if the condition fails to improve as anticipated. ? ? ?Cynthia Lang ? ?01/31/2022 12:44 PM ?Cynthia Lang  ?MRN:  [id number] ? ?Chief Complaint:  ?Chief Complaint  ?Patient presents with  ? Follow-up: 31 year old Caucasian female with history of bipolar disorder, PTSD, presented for medication management.  ? 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 ? ?Patient today reports she did not take the BuSpar ( recently prescribed) for the anxiety symptoms.  She reports she was worried about adding one more medication.  She reports she is able to cope with her anxiety symptoms most of the time.  She is interested in pursuing psychotherapy to help with the same.  Does not want to add more medications. ? ?Denies any significant depressive symptoms at this time. ? ?Reports sleep is overall okay. ? ?Patient is compliant on her medications like Depakote,  Lamictal, Geodon.  Denies side effects. ? ?Patient continues to have good support system from her mother. ? ?Patient denies any suicidality, homicidality or perceptual disturbances. ? ?Patient denies any other concerns today. ? ?Visit Diagnosis:  ?  ICD-10-CM   ?1. Bipolar 1 disorder, manic, full remission ([place])  F31.74   ?  ?2. PTSD (post-traumatic stress disorder)  F43.10   ?  ?3. GAD (generalized anxiety disorder)  F41.1 hydrOXYzine (VISTARIL) 50 MG capsule  ?  ? ? ?Past Psychiatric History: Reviewed past psychiatric history from progress Lang on 10/08/2021. ? ?Past Medical History:  ?Past Medical History:  ?Diagnosis Date  ? Depression   ? Diabetes mellitus, type II ([place])   ? Hypertension   ? 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 PCOS (polycystic ovarian syndrome)   ? PTSD (post-traumatic stress disorder)   ?  ?Past Surgical History:  ?Procedure Laterality Date  ? TONSILLECTOMY AND ADENOIDECTOMY    ? WISDOM TOOTH EXTRACTION    ? ? ?Family Psychiatric History: Reviewed family psychiatric history from progress Lang on 10/08/2021. ? ?Family History:  ?Family History  ?Problem Relation Age of Onset  ? Depression Mother   ? Anxiety disorder Mother   ? Alcohol abuse Father   ? Bipolar disorder Brother   ? Depression Brother   ? Anxiety disorder Brother   ? ? ?Social History: Reviewed social history from progress Lang on 10/08/2021. ?Social History  ? ?Socioeconomic History  ? Marital status: Single  ?  Spouse name: Not on file  ? Number of children: 0  ? Years of education: 55  ? Highest education level: High  school graduate  ?Occupational History  ? Not on file  ?Tobacco Use  ? Smoking status: Former  ?  Packs/day: 0.50  ?  Years: 10.00  ?  Pack years: 5.00  ?  Types: Cigarettes  ?  Quit date: 2014  ?  Years since quitting: 9.2  ? Smokeless tobacco: Never  ?Vaping Use  ? Vaping Use: Never used  ?Substance and Sexual Activity  ? Alcohol use: Not on file  ? Drug use: Never  ? Sexual activity: Not Currently   ?Other Topics Concern  ? Not on file  ?Social History Narrative  ? Not on file  ? ?Social Determinants of Health  ? ?Financial Resource Strain: Not on file  ?Food Insecurity: Not on file  ?Transportation Needs: Not on file  ?Physical Activity: Not on file  ?Stress: Not on file  ?Social Connections: Not on file  ? ? ?Allergies:  ?Allergies  ?Allergen Reactions  ? Naproxen Palpitations  ? Aripiprazole   ?  Urinary retention  ?Urinary retention  ?  ? Diphenhydramine Hcl   ?  Mania  ? Fluphenazine   ? Gadobenate   ? Lisinopril   ?  SOB, chest pain ?SOB, chest pain ?  ? Metoclopramide Other (See Comments)  ?  Mania ?Mania ?  ? Ondansetron Other (See Comments)  ?  Mania  ?Mania  ?  ? Oxycodone-Acetaminophen   ?  hives  ? Prednisone   ? Promethazine Other (See Comments)  ?  Mania ?Mania ?  ? ? ?Metabolic Disorder Labs: ?No results found for: HGBA1C, MPG ?No results found for: PROLACTIN ?Lab Results  ?Component Value Date  ? CHOL 165 10/18/2021  ? TRIG 156 (H) 10/18/2021  ? HDL 36 (L) 10/18/2021  ? CHOLHDL 4.6 10/18/2021  ? VLDL 31 10/18/2021  ? [employer] 98 10/18/2021  ? ?Lab Results  ?Component Value Date  ? TSH 0.504 10/18/2021  ? TSH 2.60 08/10/2013  ? ? ?Therapeutic Level Labs: ?No results found for: LITHIUM ?Lab Results  ?Component Value Date  ? VALPROATE 13 (L) 10/18/2021  ? ?No components found for:  CBMZ ? ?Current Medications: ?Current Outpatient Medications  ?Medication Sig Dispense Refill  ? ACCU-CHEK GUIDE test strip CHECK SUGARS BEFORE MEALS 3 TIMES DAILY FOR INSULIN DEPENDANT TYPE TWO DIABETES.    ? atenolol (TENORMIN) 100 MG tablet Take 100 mg by mouth daily.    ? bosutinib (BOSULIF) 500 MG tablet Take 500 mg by mouth daily with breakfast. Take with food.    ? CALTRATE 600+D3 600-20 MG-MCG TABS Take 1 tablet by mouth 2 (two) times daily.    ? cetirizine (ZYRTEC) 10 MG tablet Take 10 mg by mouth every morning.    ? clobetasol (TEMOVATE) 0.05 % external solution Apply topically 2 (two) times daily as needed.     ? Continuous Blood Gluc Sensor (DEXCOM G6 SENSOR) MISC SMARTSIG:1 Topical Every 10 Days (Patient not taking: Reported on 12/31/2021)    ? divalproex (DEPAKOTE ER) 500 MG 24 hr tablet Take 1 tablet (500 mg total) by mouth at bedtime. 30 tablet 3  ? ferrous sulfate 325 (65 FE) MG EC tablet Take by mouth.    ? furosemide (LASIX) 20 MG tablet Take 20 mg by mouth daily.    ? glipiZIDE (GLUCOTROL XL) 10 MG 24 hr tablet Take 20 mg by mouth daily.    ? HUMULIN R U-500 KWIKPEN 500 UNIT/ML KwikPen Inject into the skin.    ? hydrochlorothiazide (HYDRODIURIL) 25 MG tablet Take  by mouth.    ? hydrocortisone 2.5 % ointment Apply 1 application topically 2 (two) times daily.    ? hydrOXYzine (VISTARIL) 50 MG capsule Take 1 capsule (50 mg total) by mouth at bedtime as needed for anxiety. And sleep 30 capsule 2  ? lamoTRIgine (LAMICTAL) 200 MG tablet Take 1 tablet (200 mg total) by mouth 2 (two) times daily. 60 tablet 3  ? loratadine (CLARITIN) 10 MG tablet Take by mouth. (Patient not taking: Reported on 12/31/2021)    ? metFORMIN (GLUCOPHAGE) 1000 MG tablet Take by mouth.    ? norethindrone (AYGESTIN) 5 MG tablet Take 5 mg by mouth every morning.    ? NOVOFINE PEN NEEDLE 32G X 6 MM MISC Inject into the skin 3 (three) times daily.    ? pantoprazole (PROTONIX) 40 MG tablet Take 40 mg by mouth every morning.    ? pregabalin (LYRICA) 75 MG capsule TAKE 1 CAPSULE BY MOUTH EVERY MORNING and 2 EVERY EVENING    ? traZODone (DESYREL) 100 MG tablet Take 2 tablets (200 mg total) by mouth at bedtime. 60 tablet 3  ? TRESIBA FLEXTOUCH 200 UNIT/ML FlexTouch Pen Inject into the skin.    ? triamcinolone ointment (KENALOG) 0.1 % Apply topically.    ? valsartan (DIOVAN) 160 MG tablet Take by mouth.    ? VICTOZA 18 MG/3ML SOPN SMARTSIG:0.3 Milliliter(s) SUB-Q Daily    ? ziprasidone (GEODON) 80 MG capsule TAKE 1 CAPSULE BY MOUTH TWICE DAILY with food 60 capsule 3  ? ?No current facility-administered medications for this visit.   ? ? ? ?Musculoskeletal: ?Strength & Muscle Tone:  UTA ?Gait & Station:  Seated ?Patient leans: N/A ? ?Psychiatric Specialty Exam: ?Review of Systems  ?Psychiatric/Behavioral:  The patient is nervous/anxious.   ?All other systems reviewed

## 2022-03-04 NOTE — Unmapped (Signed)
[facility] Refill Coordination Note    Specialty Medication(s) to be Shipped:   Hematology/Oncology: Bosulif    Other medication(s) to be shipped: No additional medications requested for fill at this time     Jenna Mosley, DOB: 1991/10/16  Phone: [phone] (home)       All above HIPAA information was verified with patient.     Was a Nurse, learning disability used for this call? No    Completed refill call assessment today to schedule patient's medication shipment from the [facility] [phone]).  All relevant notes have been reviewed.     Specialty medication(s) and dose(s) confirmed: Regimen is correct and unchanged.   Changes to medications: Runa reports no changes at this time.  Changes to insurance: No  New side effects reported not previously addressed with a pharmacist or physician: None reported  Questions for the pharmacist: No    Confirmed patient received a Conservation officer, historic buildings and a Surveyor, mining with first shipment. The patient will receive a drug information handout for each medication shipped and additional FDA Medication Guides as required.       DISEASE/MEDICATION-SPECIFIC INFORMATION        N/A    SPECIALTY MEDICATION ADHERENCE     Medication Adherence    Patient reported X missed doses in the last month: 0  Specialty Medication: Bosulif  500 mg  Patient is on additional specialty medications: No  Patient is on more than two specialty medications: No  Any gaps in refill history greater than 2 weeks in the last 3 months: no  Demonstrates understanding of importance of adherence: yes  Informant: patient              Were doses missed due to medication being on hold? No    Bosulif 500mg : Patient has 10 days of medication on hand    REFERRAL TO PHARMACIST     Referral to the pharmacist: Not needed      [facility]     Shipping address confirmed in Epic.     Delivery Scheduled: Yes, Expected medication delivery date: 4/21.     Medication will be delivered via Next Day Courier to the prescription address in Epic WAM.    Olga Millers   [facility] Specialty Technician

## 2022-03-06 MED FILL — BOSULIF 500 MG TABLET: ORAL | 30 days supply | Qty: 30 | Fill #1

## 2022-03-10 NOTE — Unmapped (Unsigned)
Need to prechart for this patient.

## 2022-03-18 ENCOUNTER — Other Ambulatory Visit: Payer: Self-pay | Admitting: Psychiatry

## 2022-03-18 DIAGNOSIS — F3174 Bipolar disorder, in full remission, most recent episode manic: Secondary | ICD-10-CM

## 2022-03-18 MED ORDER — PREGABALIN 75 MG CAPSULE
ORAL_CAPSULE | 0 refills | 0 days | Status: CP
Start: 2022-03-18 — End: ?

## 2022-03-18 MED ORDER — CETIRIZINE 10 MG TABLET
ORAL_TABLET | 3 refills | 0 days | Status: CP
Start: 2022-03-18 — End: ?

## 2022-03-20 MED ORDER — NORETHINDRONE ACETATE 5 MG TABLET
ORAL_TABLET | 11 refills | 0 days
Start: 2022-03-20 — End: ?

## 2022-03-21 MED ORDER — NORETHINDRONE ACETATE 5 MG TABLET
ORAL_TABLET | 11 refills | 0 days | Status: CP
Start: 2022-03-21 — End: ?

## 2022-03-21 NOTE — Unmapped (Signed)
Please refill if appropriate.    Most recent clinic visit: 12/26/2021    Next clinic visit: 03/27/2022

## 2022-03-25 NOTE — Unmapped (Addendum)
Called to schedule as requested, left msg      ----- Message from Deedra Ehrich, LCSW sent at 03/22/2022  4:18 PM EDT -----  Regarding: AAT Pt to be scheduled with me - previously met with Maralyn Sago  Thanks!  Darl Pikes

## 2022-03-27 ENCOUNTER — Ambulatory Visit: Admit: 2022-03-27 | Discharge: 2022-03-27 | Payer: MEDICAID

## 2022-03-27 ENCOUNTER — Other Ambulatory Visit: Admit: 2022-03-27 | Discharge: 2022-03-27 | Payer: MEDICAID

## 2022-03-27 ENCOUNTER — Encounter
Admit: 2022-03-27 | Discharge: 2022-03-27 | Payer: MEDICAID | Attending: Hematology & Oncology | Primary: Hematology & Oncology

## 2022-03-27 DIAGNOSIS — C9210 Chronic myeloid leukemia, BCR/ABL-positive, not having achieved remission: Principal | ICD-10-CM

## 2022-03-27 LAB — CBC W/ AUTO DIFF
BASOPHILS ABSOLUTE COUNT: 0.1 10*9/L (ref 0.0–0.1)
BASOPHILS RELATIVE PERCENT: 0.7 %
EOSINOPHILS ABSOLUTE COUNT: 0.2 10*9/L (ref 0.0–0.5)
EOSINOPHILS RELATIVE PERCENT: 2.4 %
HEMATOCRIT: 36.2 % (ref 34.0–44.0)
HEMOGLOBIN: 12 g/dL (ref 11.3–14.9)
LYMPHOCYTES ABSOLUTE COUNT: 3.4 10*9/L (ref 1.1–3.6)
LYMPHOCYTES RELATIVE PERCENT: 41.3 %
MEAN CORPUSCULAR HEMOGLOBIN CONC: 33.3 g/dL (ref 32.0–36.0)
MEAN CORPUSCULAR HEMOGLOBIN: 28 pg (ref 25.9–32.4)
MEAN CORPUSCULAR VOLUME: 84 fL (ref 77.6–95.7)
MEAN PLATELET VOLUME: 8.8 fL (ref 6.8–10.7)
MONOCYTES ABSOLUTE COUNT: 0.4 10*9/L (ref 0.3–0.8)
MONOCYTES RELATIVE PERCENT: 4.4 %
NEUTROPHILS ABSOLUTE COUNT: 4.2 10*9/L (ref 1.8–7.8)
NEUTROPHILS RELATIVE PERCENT: 51.2 %
PLATELET COUNT: 264 10*9/L (ref 150–450)
RED BLOOD CELL COUNT: 4.31 10*12/L (ref 3.95–5.13)
RED CELL DISTRIBUTION WIDTH: 14.4 % (ref 12.2–15.2)
WBC ADJUSTED: 8.2 10*9/L (ref 3.6–11.2)

## 2022-03-27 LAB — COMPREHENSIVE METABOLIC PANEL
ALBUMIN: 3.7 g/dL (ref 3.4–5.0)
ALKALINE PHOSPHATASE: 158 U/L — ABNORMAL HIGH (ref 46–116)
ALT (SGPT): 19 U/L (ref 10–49)
ANION GAP: 12 mmol/L (ref 5–14)
AST (SGOT): 31 U/L (ref <=34)
BILIRUBIN TOTAL: 0.3 mg/dL (ref 0.3–1.2)
BLOOD UREA NITROGEN: 20 mg/dL (ref 9–23)
BUN / CREAT RATIO: 21
CALCIUM: 9.7 mg/dL (ref 8.7–10.4)
CHLORIDE: 105 mmol/L (ref 98–107)
CO2: 20 mmol/L (ref 20.0–31.0)
CREATININE: 0.96 mg/dL — ABNORMAL HIGH
EGFR CKD-EPI (2021) FEMALE: 81 mL/min/{1.73_m2} (ref >=60)
GLUCOSE RANDOM: 271 mg/dL — ABNORMAL HIGH (ref 70–179)
POTASSIUM: 4.8 mmol/L (ref 3.4–4.8)
PROTEIN TOTAL: 7.5 g/dL (ref 5.7–8.2)
SODIUM: 137 mmol/L (ref 135–145)
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
## 2022-03-27 NOTE — Unmapped (Addendum)
I would recommend you use salicylic acid from the ordinary for acne.     Keep your appt with the dermatologist on June 7th at 3:15.    I will send you a message with the results of your BCR-ABL once it comes back.     I will plan on seeing you back in 3 months    Appointment on 03/27/2022   Component Date Value Ref Range Status    Sodium 03/27/2022 137  135 - 145 mmol/L Final    Potassium 03/27/2022 4.8  3.4 - 4.8 mmol/L Final    Chloride 03/27/2022 105  98 - 107 mmol/L Final    CO2 03/27/2022 20.0  20.0 - 31.0 mmol/L Final    Anion Gap 03/27/2022 12  5 - 14 mmol/L Final    BUN 03/27/2022 20  9 - 23 mg/dL Final    Creatinine 16/08/9603 0.96 (H)  0.60 - 0.80 mg/dL Final    BUN/Creatinine Ratio 03/27/2022 21   Final    eGFR CKD-EPI (2021) Female 03/27/2022 81  >=60 mL/min/1.32m2 Final    eGFR calculated with CKD-EPI 2021 equation in accordance with [employer] and [employer] of Nephrology Task Force recommendations.    Glucose 03/27/2022 271 (H)  70 - 179 mg/dL Final    Calcium 54/07/8118 9.7  8.7 - 10.4 mg/dL Final    Albumin 14/78/2956 3.7  3.4 - 5.0 g/dL Final    Total Protein 03/27/2022 7.5  5.7 - 8.2 g/dL Final    Total Bilirubin 03/27/2022 0.3  0.3 - 1.2 mg/dL Final    AST 21/30/8657 31  <=34 U/L Final    ALT 03/27/2022 19  10 - 49 U/L Final    Alkaline Phosphatase 03/27/2022 158 (H)  46 - 116 U/L Final    Collection 03/27/2022 Collected   Final    WBC 03/27/2022 8.2  3.6 - 11.2 10*9/L Final    RBC 03/27/2022 4.31  3.95 - 5.13 10*12/L Final    HGB 03/27/2022 12.0  11.3 - 14.9 g/dL Final    HCT 84/69/6295 36.2  34.0 - 44.0 % Final    MCV 03/27/2022 84.0  77.6 - 95.7 fL Final    MCH 03/27/2022 28.0  25.9 - 32.4 pg Final    MCHC 03/27/2022 33.3  32.0 - 36.0 g/dL Final    RDW 28/41/3244 14.4  12.2 - 15.2 % Final    MPV 03/27/2022 8.8  6.8 - 10.7 fL Final    Platelet 03/27/2022 264  150 - 450 10*9/L Final    Neutrophils % 03/27/2022 51.2  % Final    Lymphocytes % 03/27/2022 41.3  % Final Monocytes % 03/27/2022 4.4  % Final    Eosinophils % 03/27/2022 2.4  % Final    Basophils % 03/27/2022 0.7  % Final    Absolute Neutrophils 03/27/2022 4.2  1.8 - 7.8 10*9/L Final    Absolute Lymphocytes 03/27/2022 3.4  1.1 - 3.6 10*9/L Final    Absolute Monocytes 03/27/2022 0.4  0.3 - 0.8 10*9/L Final    Absolute Eosinophils 03/27/2022 0.2  0.0 - 0.5 10*9/L Final    Absolute Basophils 03/27/2022 0.1  0.0 - 0.1 10*9/L Final
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
## 2022-04-02 NOTE — Unmapped (Signed)
[facility] Refill Coordination Note    Specialty Medication(s) to be Shipped:   Hematology/Oncology: Bosulif    Other medication(s) to be shipped: No additional medications requested for fill at this time     Mayvis Agudelo, DOB: [birth year]  Phone: [phone] (home)       All above HIPAA information was verified with patient.     Was a Nurse, learning disability used for this call? No    Completed refill call assessment today to schedule patient's medication shipment from the [facility] [phone]).  All relevant notes have been reviewed.     Specialty medication(s) and dose(s) confirmed: Regimen is correct and unchanged.   Changes to medications: Janeece reports no changes at this time.  Changes to insurance: No  New side effects reported not previously addressed with a pharmacist or physician: None reported  Questions for the pharmacist: No    Confirmed patient received a Conservation officer, historic buildings and a Surveyor, mining with first shipment. The patient will receive a drug information handout for each medication shipped and additional FDA Medication Guides as required.       DISEASE/MEDICATION-SPECIFIC INFORMATION        N/A    SPECIALTY MEDICATION ADHERENCE     Medication Adherence    Patient reported X missed doses in the last month: 0  Specialty Medication: Bosulif 500 mg  Patient is on additional specialty medications: No              Were doses missed due to medication being on hold? No    Bosulif 500mg   : 11 days of medicine on hand       REFERRAL TO PHARMACIST     Referral to the pharmacist: Not needed      [facility]     Shipping address confirmed in Epic.     Delivery Scheduled: Yes, Expected medication delivery date: 5/23.     Medication will be delivered via Same Day Courier to the prescription address in Epic WAM.    Westley Gambles   [facility] Specialty Technician

## 2022-04-03 ENCOUNTER — Encounter: Payer: Self-pay | Admitting: Psychiatry

## 2022-04-03 ENCOUNTER — Telehealth (INDEPENDENT_AMBULATORY_CARE_PROVIDER_SITE_OTHER): Payer: No Typology Code available for payment source | Admitting: Psychiatry

## 2022-04-03 DIAGNOSIS — F411 Generalized anxiety disorder: Secondary | ICD-10-CM | POA: Diagnosis not present

## 2022-04-03 DIAGNOSIS — F3174 Bipolar disorder, in full remission, most recent episode manic: Secondary | ICD-10-CM | POA: Diagnosis not present

## 2022-04-03 DIAGNOSIS — F4310 Post-traumatic stress disorder, unspecified: Secondary | ICD-10-CM | POA: Diagnosis not present

## 2022-04-03 MED ORDER — HYDROXYZINE HCL 50 MG PO TABS: 50.0000 mg | ORAL_TABLET | Freq: Every evening | ORAL | 2 refills | Status: DC | PRN

## 2022-04-03 NOTE — Progress Notes (Signed)
Virtual Visit via Video Note  I connected with Cynthia Lang on 04/03/22 at  4:00 PM EDT by a video enabled telemedicine application and verified that I am speaking with the correct person using two identifiers.  Location Provider Location : [facility] Patient Location : Home  Participants: Patient , Provider    I discussed the limitations of evaluation and management by telemedicine and the availability of in person appointments. The patient expressed understanding and agreed to proceed.  I discussed the assessment and treatment plan with the patient. The patient was provided an opportunity to ask questions and all were answered. The patient agreed with the plan and demonstrated an understanding of the instructions.   The patient was advised to call back or seek an in-person evaluation if the symptoms worsen or if the condition fails to improve as anticipated.   [facility] MD OP Progress Note  04/03/2022 6:50 PM Cynthia Lang  MRN:  [id number]  Chief Complaint:  Chief Complaint  Patient presents with   Follow-up: 31 year old Caucasian female with history of bipolar disorder, PTSD, presented for medication management.   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, diabetes mellitus type 2, hypertension was evaluated by telemedicine today.  Patient reports she is currently going through psychosocial stressors of relocating to a new home.  Patient reports she currently lives in bad neighborhood and hence is trying to move.  That does make her anxious.  She however overall has been coping okay with the anxiety although it does have an impact on her sleep.  The past few nights sleep has been restless.  The hydroxyzine does help to some extent.  She is interested in dosage increase.  She also takes trazodone.  Patient calls herself a Research officer, trade union,  patient reports she worries all the time however works on her coping strategies and that has been beneficial.  Patient denies any suicidality, homicidality or perceptual disturbances.  Patient denies any side effects to medications.  Reports she is compliant on her medications.  Patient denies any other concerns today.  Visit Diagnosis:    ICD-10-CM   1. Bipolar 1 disorder, manic, full remission ([place])  F31.74     2. PTSD (post-traumatic stress disorder)  F43.10 hydrOXYzine (ATARAX) 50 MG tablet    3. GAD (generalized anxiety disorder)  F41.1 hydrOXYzine (ATARAX) 50 MG tablet      Past Psychiatric History: Reviewed past psychiatric history from progress note on 10/08/2021.  Past Medical History:  Past Medical History:  Diagnosis Date   Depression    Diabetes mellitus, type II ([place])    Hypertension    Leukemia, chronic myeloid ([place]) 02/20/2021   PCOS (polycystic ovarian syndrome)    PTSD (post-traumatic stress disorder)     Past Surgical History:  Procedure Laterality Date   TONSILLECTOMY AND ADENOIDECTOMY     WISDOM TOOTH EXTRACTION      Family Psychiatric History: Reviewed family psychiatric history from progress note on 10/08/2021.  Family History:  Family History  Problem Relation Age of Onset   Depression Mother    Anxiety disorder Mother    Alcohol abuse Father    Bipolar disorder Brother    Depression Brother    Anxiety disorder Brother     Social History: Reviewed social history from progress note on 10/08/2021. Social History   Socioeconomic History   Marital status: Single  Spouse name: Not on file   Number of children: 0   Years of education: 87   Highest education level: High school graduate  Occupational History   Not on file  Tobacco Use   Smoking status: Former    Packs/day: 0.50    Years: 10.00    Pack years: 5.00    Types: Cigarettes    Quit date: 2014    Years since quitting: 9.3   Smokeless tobacco: Never  Vaping Use   Vaping  Use: Never used  Substance and Sexual Activity   Alcohol use: Not on file   Drug use: Never   Sexual activity: Not Currently  Other Topics Concern   Not on file  Social History Narrative   Not on file   Social Determinants of Health   Financial Resource Strain: Not on file  Food Insecurity: Not on file  Transportation Needs: Not on file  Physical Activity: Not on file  Stress: Not on file  Social Connections: Not on file    Allergies:  Allergies  Allergen Reactions   Naproxen Palpitations   Aripiprazole     Urinary retention  Urinary retention     Diphenhydramine Hcl     Mania   Fluphenazine    Gadobenate    Lisinopril     SOB, chest pain SOB, chest pain    Metoclopramide Other (See Comments)    Mania Mania    Ondansetron Other (See Comments)    Mania  Mania     Oxycodone-Acetaminophen     hives   Prednisone    Promethazine Other (See Comments)    Mania Mania     Metabolic Disorder Labs: No results found for: HGBA1C, MPG No results found for: PROLACTIN Lab Results  Component Value Date   CHOL 165 10/18/2021   TRIG 156 (H) 10/18/2021   HDL 36 (L) 10/18/2021   CHOLHDL 4.6 10/18/2021   VLDL 31 10/18/2021   LDLCALC 98 10/18/2021   Lab Results  Component Value Date   TSH 0.504 10/18/2021   TSH 2.60 08/10/2013    Therapeutic Level Labs: No results found for: LITHIUM Lab Results  Component Value Date   VALPROATE 13 (L) 10/18/2021   No components found for:  CBMZ  Current Medications: Current Outpatient Medications  Medication Sig Dispense Refill   hydrOXYzine (ATARAX) 50 MG tablet Take 1-1.5 tablets (50-75 mg total) by mouth at bedtime as needed. For anxiety, sleep 45 tablet 2   ACCU-CHEK GUIDE test strip CHECK SUGARS BEFORE MEALS 3 TIMES DAILY FOR INSULIN DEPENDANT TYPE TWO DIABETES.     atenolol (TENORMIN) 100 MG tablet Take 100 mg by mouth daily.     bosutinib (BOSULIF) 500 MG tablet Take 500 mg by mouth daily with breakfast. Take with  food.     CALTRATE 600+D3 600-20 MG-MCG TABS Take 1 tablet by mouth 2 (two) times daily.     cetirizine (ZYRTEC) 10 MG tablet Take 10 mg by mouth every morning.     clobetasol (TEMOVATE) 0.05 % external solution Apply topically 2 (two) times daily as needed.     Continuous Blood Gluc Sensor (DEXCOM G6 SENSOR) MISC SMARTSIG:1 Topical Every 10 Days (Patient not taking: Reported on 12/31/2021)     divalproex (DEPAKOTE ER) 500 MG 24 hr tablet Take 1 tablet (500 mg total) by mouth at bedtime. 30 tablet 3   ferrous sulfate 325 (65 FE) MG EC tablet Take by mouth.     furosemide (LASIX) 20 MG tablet Take  20 mg by mouth daily.     glipiZIDE (GLUCOTROL XL) 10 MG 24 hr tablet Take 20 mg by mouth daily.     HUMULIN R U-500 KWIKPEN 500 UNIT/ML KwikPen Inject into the skin.     hydrochlorothiazide (HYDRODIURIL) 25 MG tablet Take by mouth.     hydrocortisone 2.5 % ointment Apply 1 application topically 2 (two) times daily.     lamoTRIgine (LAMICTAL) 200 MG tablet Take 1 tablet (200 mg total) by mouth 2 (two) times daily. 60 tablet 3   loratadine (CLARITIN) 10 MG tablet Take by mouth. (Patient not taking: Reported on 12/31/2021)     metFORMIN (GLUCOPHAGE) 1000 MG tablet Take by mouth.     norethindrone (AYGESTIN) 5 MG tablet Take 5 mg by mouth every morning.     NOVOFINE PEN NEEDLE 32G X 6 MM MISC Inject into the skin 3 (three) times daily.     pantoprazole (PROTONIX) 40 MG tablet Take 40 mg by mouth every morning.     pregabalin (LYRICA) 75 MG capsule TAKE 1 CAPSULE BY MOUTH EVERY MORNING and 2 EVERY EVENING     traZODone (DESYREL) 100 MG tablet Take 2 tablets (200 mg total) by mouth at bedtime. 60 tablet 3   TRESIBA FLEXTOUCH 200 UNIT/ML FlexTouch Pen Inject into the skin.     triamcinolone ointment (KENALOG) 0.1 % Apply topically.     valsartan (DIOVAN) 160 MG tablet Take by mouth.     VICTOZA 18 MG/3ML SOPN SMARTSIG:0.3 Milliliter(s) SUB-Q Daily     ziprasidone (GEODON) 80 MG capsule TAKE 1 CAPSULE BY  MOUTH TWICE DAILY WITHFOOD. 60 capsule 3   No current facility-administered medications for this visit.     Musculoskeletal: Strength & Muscle Tone:  UTA Gait & Station:  Seated Patient leans: N/A  Psychiatric Specialty Exam: Review of Systems  Psychiatric/Behavioral:  Positive for sleep disturbance.   All other systems reviewed and are negative.  There were no vitals taken for this visit.There is no height or weight on file to calculate BMI.  General Appearance: Casual  Eye Contact:  Fair  Speech:  Clear and Coherent  Volume:  Normal  Mood:  Euthymic  Affect:  Congruent  Thought Process:  Goal Directed and Descriptions of Associations: Intact  Orientation:  Full (Time, Place, and Person)  Thought Content: Logical   Suicidal Thoughts:  No  Homicidal Thoughts:  No  Memory:  Immediate;   Fair Recent;   Fair Remote;   Fair  Judgement:  Fair  Insight:  Fair  Psychomotor Activity:  Normal  Concentration:  Concentration: Fair and Attention Span: Fair  Recall:  [employer] of Knowledge: Fair  Language: Fair  Akathisia:  No  Handed:  Right  AIMS (if indicated): done  Assets:  Communication Skills Desire for Improvement Housing Social Support  ADL's:  Intact  Cognition: WNL  Sleep:   restless   Screenings: AIMS    Flowsheet Row Video Visit from 04/03/2022 in [facility] Video Visit from 01/31/2022 in [facility] Total Score 0 0      GAD-7    Flowsheet Row Video Visit from 04/03/2022 in [facility] Video Visit from 01/31/2022 in [facility] Office Visit from 12/31/2021 in [facility]  Total GAD-7 Score '11 11 12      '$ PHQ2-9    Flowsheet Row Video Visit from 04/03/2022 in [facility] Visit from 12/31/2021 in [facility] Office Visit from 10/31/2021 in [facility] Office Visit from 10/08/2021 in [facility]  PHQ-2 Total Score 0 0 2 3  PHQ-9 Total Score -- -- 8 13      Flowsheet Row Video Visit from 04/03/2022 in [facility] Office Visit from 12/31/2021 in [facility] Office Visit from 10/31/2021 in [facility] Low Risk No Risk No Risk        Assessment and Plan: Cynthia Lang is a 31 year old Caucasian female, single, on disability, lives in [place] with her mother, was evaluated by telemedicine today.  Patient with psychosocial stressors of upcoming relocation, is struggling with sleep likely due to anxiety.  Patient will benefit from the following plan.  Plan  Bipolar disorder in remission Geodon 80 mg p.o. twice daily Lamotrigine 200 mg p.o. twice daily Depakote ER 500 mg at bedtime Depakote level-10/18/2021-13-subtherapeutic  PTSD-improving Continue psychotherapy sessions with Ms.Gigie Ritchie. Increase hydroxyzine to 50-75 mg p.o. nightly as needed for sleep.  GAD-unstable Continue CBT Increase hydroxyzine to 50 to 75 mg p.o. nightly as needed for anxiety and sleep. We will consider adding an SSRI in the future as needed.  Follow-up in clinic in 2 to 3 months or sooner if needed.      Collaboration of Care: Collaboration of Care: Referral or follow-up with counselor/therapist AEB encouraged to continue to follow up with therapist.  Patient/Guardian was advised Release of Information must be obtained prior to any record release in order to collaborate their care with an outside provider. Patient/Guardian was advised if they have not already done so to contact the registration department to sign all necessary forms in order for [place] to release information regarding their care.   Consent: Patient/Guardian gives verbal consent for treatment and assignment of  benefits for services provided during this visit. Patient/Guardian expressed understanding and agreed to proceed.   This note was generated in part or whole with voice recognition software. Voice recognition is usually quite accurate but there are transcription errors that can and very often do occur. I apologize for any typographical errors that were not detected and corrected.      Ursula Alert, MD 04/03/2022, 6:50 PM

## 2022-04-07 MED FILL — BOSULIF 500 MG TABLET: ORAL | 30 days supply | Qty: 30 | Fill #2

## 2022-04-17 DIAGNOSIS — I10 Essential (primary) hypertension: Principal | ICD-10-CM

## 2022-04-17 MED ORDER — HYDROCHLOROTHIAZIDE 25 MG TABLET
ORAL_TABLET | 3 refills | 0 days
Start: 2022-04-17 — End: ?

## 2022-04-17 MED ORDER — PANTOPRAZOLE 40 MG TABLET,DELAYED RELEASE
ORAL_TABLET | 3 refills | 0 days
Start: 2022-04-17 — End: ?

## 2022-04-18 MED ORDER — PANTOPRAZOLE 40 MG TABLET,DELAYED RELEASE
ORAL_TABLET | 3 refills | 0 days | Status: CP
Start: 2022-04-18 — End: ?

## 2022-04-18 MED ORDER — HYDROCHLOROTHIAZIDE 25 MG TABLET
ORAL_TABLET | 3 refills | 0 days | Status: CP
Start: 2022-04-18 — End: ?

## 2022-04-18 NOTE — Unmapped (Signed)
Patient is requesting the following refill  Requested Prescriptions     Pending Prescriptions Disp Refills    hydroCHLOROthiazide (HYDRODIURIL) 25 MG tablet [Pharmacy Med Name: HYDROCHLOROTHIAZIDE 25 MG TAB] 90 tablet 3     Sig: TAKE 1 TABLET BY MOUTH IN THE MORNING.       Order refilled per protocol.    Last OV: 11/26/2021   Next OV: 04/17/2022

## 2022-04-18 NOTE — Unmapped (Signed)
Patient is requesting the following refill  Requested Prescriptions     Pending Prescriptions Disp Refills    pantoprazole (PROTONIX) 40 MG tablet [Pharmacy Med Name: PANTOPRAZOLE SODIUM 40 MG DR TAB] 90 tablet 3     Sig: TAKE 1 TABLET BY MOUTH IN THE MORNING.       Order refilled per protocol.    Last OV: 11/26/2021   Next OV: Visit date not found

## 2022-04-22 ENCOUNTER — Institutional Professional Consult (permissible substitution): Admit: 2022-04-22 | Discharge: 2022-04-22 | Payer: MEDICAID

## 2022-04-22 ENCOUNTER — Ambulatory Visit: Admit: 2022-04-22 | Discharge: 2022-04-22 | Payer: MEDICAID

## 2022-04-22 DIAGNOSIS — C9210 Chronic myeloid leukemia, BCR/ABL-positive, not having achieved remission: Principal | ICD-10-CM

## 2022-04-22 LAB — COMPREHENSIVE METABOLIC PANEL
ALBUMIN: 3.8 g/dL (ref 3.4–5.0)
ALKALINE PHOSPHATASE: 140 U/L — ABNORMAL HIGH (ref 46–116)
ALT (SGPT): 8 U/L — ABNORMAL LOW (ref 10–49)
ANION GAP: 10 mmol/L (ref 5–14)
AST (SGOT): 23 U/L (ref <=34)
BILIRUBIN TOTAL: 0.3 mg/dL (ref 0.3–1.2)
BLOOD UREA NITROGEN: 13 mg/dL (ref 9–23)
BUN / CREAT RATIO: 14
CALCIUM: 10 mg/dL (ref 8.7–10.4)
CHLORIDE: 106 mmol/L (ref 98–107)
CO2: 23.2 mmol/L (ref 20.0–31.0)
CREATININE: 0.95 mg/dL — ABNORMAL HIGH
EGFR CKD-EPI (2021) FEMALE: 82 mL/min/{1.73_m2} (ref >=60)
GLUCOSE RANDOM: 141 mg/dL (ref 70–179)
POTASSIUM: 4.2 mmol/L (ref 3.4–4.8)
PROTEIN TOTAL: 7.4 g/dL (ref 5.7–8.2)
SODIUM: 139 mmol/L (ref 135–145)

## 2022-04-22 LAB — CBC W/ AUTO DIFF
BASOPHILS ABSOLUTE COUNT: 0.1 10*9/L (ref 0.0–0.1)
BASOPHILS RELATIVE PERCENT: 1.4 %
EOSINOPHILS ABSOLUTE COUNT: 0.1 10*9/L (ref 0.0–0.5)
EOSINOPHILS RELATIVE PERCENT: 1.5 %
HEMATOCRIT: 35.4 % (ref 34.0–44.0)
HEMOGLOBIN: 11.8 g/dL (ref 11.3–14.9)
LYMPHOCYTES ABSOLUTE COUNT: 3.2 10*9/L (ref 1.1–3.6)
LYMPHOCYTES RELATIVE PERCENT: 36.6 %
MEAN CORPUSCULAR HEMOGLOBIN CONC: 33.4 g/dL (ref 32.0–36.0)
MEAN CORPUSCULAR HEMOGLOBIN: 27.7 pg (ref 25.9–32.4)
MEAN CORPUSCULAR VOLUME: 82.9 fL (ref 77.6–95.7)
MEAN PLATELET VOLUME: 8 fL (ref 6.8–10.7)
MONOCYTES ABSOLUTE COUNT: 0.4 10*9/L (ref 0.3–0.8)
MONOCYTES RELATIVE PERCENT: 4.7 %
NEUTROPHILS ABSOLUTE COUNT: 4.9 10*9/L (ref 1.8–7.8)
NEUTROPHILS RELATIVE PERCENT: 55.8 %
NUCLEATED RED BLOOD CELLS: 0 /100{WBCs} (ref <=4)
PLATELET COUNT: 288 10*9/L (ref 150–450)
RED BLOOD CELL COUNT: 4.26 10*12/L (ref 3.95–5.13)
RED CELL DISTRIBUTION WIDTH: 14.3 % (ref 12.2–15.2)
WBC ADJUSTED: 8.7 10*9/L (ref 3.6–11.2)

## 2022-04-22 MED ADMIN — leuprolide (LUPRON) injection 11.25 mg: 11.25 mg | INTRAMUSCULAR | @ 18:00:00 | Stop: 2022-04-22

## 2022-04-22 NOTE — Unmapped (Signed)
Aditi presents to [facility] clinic today for Lupron injection. Lupron given as directed per therapy plan. Pt tolerated injection well. [employer] has no further needs or concerns at this time and is stopping at check out to schedule next Lupron injection.

## 2022-04-23 ENCOUNTER — Ambulatory Visit: Admit: 2022-04-23 | Discharge: 2022-04-24 | Payer: MEDICAID

## 2022-04-23 DIAGNOSIS — L700 Acne vulgaris: Principal | ICD-10-CM

## 2022-04-23 MED ORDER — DOXYCYCLINE HYCLATE 50 MG CAPSULE
ORAL_CAPSULE | Freq: Two times a day (BID) | ORAL | 2 refills | 30.00000 days | Status: CP
Start: 2022-04-23 — End: 2022-07-22

## 2022-04-23 MED ORDER — CLINDAMYCIN PHOSPHATE 1 % TOPICAL SWAB
11 refills | 0.00000 days | Status: CP
Start: 2022-04-23 — End: ?

## 2022-04-23 MED ORDER — TRETINOIN 0.025 % TOPICAL CREAM
11 refills | 0 days | Status: CP
Start: 2022-04-23 — End: ?

## 2022-04-23 NOTE — Unmapped (Signed)
[facility] releases most results to you as soon as they are available. Therefore, you may see some results before we do. Please give us 3 business days to review the tests and contact you by phone or through MyChart. If you are concerned that some results may be upsetting or confusing, you may wish to wait until we contact you before looking at the report in MyChart.   If you have an urgent question, you can send us a message or call our clinic. Otherwise, we prefer that you wait 3 business days for us to contact you.    Dermatology Clinical Team

## 2022-04-23 NOTE — Unmapped (Signed)
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 If your face is not too dry, increase by one additional night each week. Work up to every night. If your face becomes dry and irritated, take one or two nights off.  - start doxycycline (VIBRAMYCIN) 50 MG capsule; Take 1 capsule (50 mg total) by mouth Two (2) times a day. Take with food, stay upright for at least 30 min, avoid taking with calcium supplements or high-calcium foods. Protect skin from the sun with SPF 50 sunscreen or protective clothing.  - start clindamycin (CLEOCIN T) 1 % Swab; Apply to acne bumps twice per day until they resolve    The patient was advised to call for an appointment should any new, changing, or symptomatic lesions develop.     RTC: Return in about 3 months (around 07/24/2022). or sooner as needed   _________________________________________________________________      Chief Complaint     Chief Complaint   Patient presents with   ??? Acne     Face sores,  both  legs top  thigh pain 0        HPI     Jenna Mosley is a 31 y.o. female who presents as a returning patient (last seen 09/24/2021) to Dermatology for  acne. Patient describes cystic lesions that rupture. Bumpy lesions are bothersome and patient picks at them. On birth control and leuprolide. No plans for pregnancy. Azelaic acid was over $700 when previously prescribed.    The patient denies any other new or changing lesions or areas of concern.     Pertinent Past Medical History     Diagnosis of [name] on bosutinib  On leuprolide injections    Past Medical History, Family History, Social History, Medication List, Allergies, and Problem List were reviewed in the rooming section of Epic.     ROS: Other than symptoms mentioned in the HPI, no fevers, chills, or other skin complaints    Physical Examination     GENERAL: Well-appearing female in no acute distress, resting comfortably.  NEURO: Alert and oriented, answers questions appropriately  SKIN (Focal Skin Exam): Per patient request, examination of face was performed    Scattered acneiform inflammatory papules on mid to lower face, upper neck, and upper arms    All areas not commented on are within normal limits or unremarkable      (Approved Template 07/30/2020)

## 2022-04-28 NOTE — Unmapped (Signed)
[facility] Refill Coordination Note    Specialty Medication(s) to be Shipped:   Hematology/Oncology: Bosulif    Other medication(s) to be shipped: No additional medications requested for fill at this time     Jenna Mosley, DOB: Sep 01, 1991  Phone: [phone] (home)       All above HIPAA information was verified with patient.     Was a Nurse, learning disability used for this call? No    Completed refill call assessment today to schedule patient's medication shipment from the [facility] [phone]).  All relevant notes have been reviewed.     Specialty medication(s) and dose(s) confirmed: Regimen is correct and unchanged.   Changes to medications: Raisha reports no changes at this time.  Changes to insurance: No  New side effects reported not previously addressed with a pharmacist or physician: None reported  Questions for the pharmacist: No    Confirmed patient received a Conservation officer, historic buildings and a Surveyor, mining with first shipment. The patient will receive a drug information handout for each medication shipped and additional FDA Medication Guides as required.       DISEASE/MEDICATION-SPECIFIC INFORMATION        N/A    SPECIALTY MEDICATION ADHERENCE     Medication Adherence    Patient reported X missed doses in the last month: 1  Specialty Medication: Bosulif 500 mg  Patient is on additional specialty medications: No  Informant: patient          Were doses missed due to medication being on hold? No    Bosulif 500mg : 14 days of medicine on hand       REFERRAL TO PHARMACIST     Referral to the pharmacist: Not needed      [facility]     Shipping address confirmed in Epic.     Delivery Scheduled: Yes, Expected medication delivery date: 05/10/22.     Medication will be delivered via Next Day Courier to the prescription address in Epic [place].    [name] [name]   [facility] Specialty Technician

## 2022-04-30 NOTE — Unmapped (Signed)
I saw and evaluated the patient, participating in the key portions of the service.  I reviewed the resident’s note.  I agree with the resident’s findings and plan.     Gerry Heaphy, MD

## 2022-05-09 MED FILL — BOSULIF 500 MG TABLET: ORAL | 30 days supply | Qty: 30 | Fill #3

## 2022-05-09 NOTE — Unmapped (Signed)
Fax received requesting PA for Retin A, pt has medicaid so this medication does not need a PA since the name brand is covered, called [facility] and they ran medication and they said that the medication is approved. Attempted to notify pt of the same, call not answered, LVM

## 2022-05-14 ENCOUNTER — Telehealth: Admit: 2022-05-14 | Discharge: 2022-05-15 | Payer: MEDICAID | Attending: Clinical | Primary: Clinical

## 2022-05-14 DIAGNOSIS — Z713 Dietary counseling and surveillance: Principal | ICD-10-CM
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
## 2022-06-04 ENCOUNTER — Telehealth: Admit: 2022-06-04 | Discharge: 2022-06-05 | Payer: MEDICAID | Attending: Clinical | Primary: Clinical

## 2022-06-05 NOTE — Unmapped (Signed)
[facility] Refill Coordination Note    Specialty Medication(s) to be Shipped:   Hematology/Oncology: Bosulif    Other medication(s) to be shipped: No additional medications requested for fill at this time     Jenna Mosley, DOB: 1991/10/01  Phone: [phone] (home)       All above HIPAA information was verified with patient.     Was a Nurse, learning disability used for this call? No    Completed refill call assessment today to schedule patient's medication shipment from the [facility] [phone]).  All relevant notes have been reviewed.     Specialty medication(s) and dose(s) confirmed: Regimen is correct and unchanged.   Changes to medications: Melaine reports no changes at this time.  Changes to insurance: No  New side effects reported not previously addressed with a pharmacist or physician: None reported  Questions for the pharmacist: No    Confirmed patient received a Conservation officer, historic buildings and a Surveyor, mining with first shipment. The patient will receive a drug information handout for each medication shipped and additional FDA Medication Guides as required.       DISEASE/MEDICATION-SPECIFIC INFORMATION        N/A    SPECIALTY MEDICATION ADHERENCE     Medication Adherence    Patient reported X missed doses in the last month: 0  Specialty Medication: Bosulif 500 mg  Patient is on additional specialty medications: No  Patient is on more than two specialty medications: No  Any gaps in refill history greater than 2 weeks in the last 3 months: no  Demonstrates understanding of importance of adherence: yes  Informant: patient              Were doses missed due to medication being on hold? No    Bosulif 500mg : Patient has 11 days of medication on hand    REFERRAL TO PHARMACIST     Referral to the pharmacist: Not needed      [facility]     Shipping address confirmed in Epic.     Delivery Scheduled: Yes, Expected medication delivery date: 7/27.     Medication will be delivered via Next Day Courier to the prescription address in Epic WAM.    Olga Millers   [facility] Specialty Technician

## 2022-06-05 NOTE — Unmapped (Addendum)
Session #2  -Patient discussed what was helpful when she lost weight to around 275 and was able to maintain that weight: mindful eating, paying attention to stomach and not getting too full, taking home 1/2 of food from restaurant, putting fork down between bites, and not putting everything on plate at a buffet. Feels like stress of life and depression has gotten in the way as she described using food to comfort. She discussed knowing that she needs to cope in another way.   -Patient feels overwhelmed when having to think about cooking and making good decisions. She described some flashbacks and memories about the past around food. She has reached out to a program that provides counseling and medication management. She is waiting for a return call.   -She attends diabetes nutrition education on 06/18/2022.  - Patient described schedule for eating. She gets up around 10:00 or 11:00 and will eat lunch between 11:00 am and 12:00 pm. She will then eat dinner around 5:00 or 6:00 pm. She described eating fast and being hungry. She described feeling her blood sugar drop one night and having to eat some cereal to bring it back up. She feels like if she has a routine with eating that she may not be as hungry in the evening and her blood sugar will be more stable.     Client created the following goal: Eat a snack in the afternoon everyday.

## 2022-06-11 MED FILL — BOSULIF 500 MG TABLET: ORAL | 30 days supply | Qty: 30 | Fill #4

## 2022-06-18 ENCOUNTER — Institutional Professional Consult (permissible substitution): Admit: 2022-06-18 | Discharge: 2022-06-19 | Payer: MEDICAID

## 2022-06-18 DIAGNOSIS — I10 Essential (primary) hypertension: Principal | ICD-10-CM

## 2022-06-18 DIAGNOSIS — E1165 Type 2 diabetes mellitus with hyperglycemia: Principal | ICD-10-CM

## 2022-06-18 DIAGNOSIS — Z794 Long term (current) use of insulin: Principal | ICD-10-CM

## 2022-06-18 MED ORDER — VALSARTAN 160 MG TABLET
ORAL_TABLET | 3 refills | 0 days | Status: CP
Start: 2022-06-18 — End: ?

## 2022-06-18 MED ORDER — PREGABALIN 75 MG CAPSULE
ORAL_CAPSULE | 0 refills | 0 days | Status: CP
Start: 2022-06-18 — End: ?

## 2022-06-18 NOTE — Unmapped (Signed)
Patient is requesting the following refill  Requested Prescriptions     Pending Prescriptions Disp Refills    pregabalin (LYRICA) 75 MG capsule [Pharmacy Med Name: PREGABALIN 75 MG CAP] 270 capsule 0     Sig: TAKE 1 CAPSULE BY MOUTH IN THE MORNING AND 2 CAPSULES BY MOUTH IN THE EVENING.    valsartan (DIOVAN) 160 MG tablet [Pharmacy Med Name: VALSARTAN 160 MG TAB] 90 tablet 3     Sig: TAKE 1 TABLET BY MOUTH DAILY       Order pended. Please advise. Thanks    Last OV: 11/26/2021   Next OV: 07/04/2022

## 2022-06-18 NOTE — Unmapped (Signed)
Take PM insulin at dinner instead if bedtime to avoid hypoglycemia per Dr. Koren Shiver  Get new Dexcom G6 CGM Transmitter and start Dexcom sensor again.  Schedule follow up with Dr. Eulas Post
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
## 2022-06-24 NOTE — Unmapped (Addendum)
Session #3:  Goals for this session:  1. Eat an afternoon snack 4x per week.    2. Do something pleasurable at least 2 days per week.     Reviewed goal from last session:   Goal: Eat a snack in the afternoon every day     Vance stated that she has been having a snack typically 3x per week and that she has found it decreases her food cravings in the evening and that she tends to not eat as much. Lissa said that she has been cooking a little bit more, which she is expressed being proud of. She finds that if she cooks she will sometimes eat some of that for her snack.     Elenora met with the dietician for diabetes education. She said that she does have a fairly good understanding of what to eat for her diabetes, however portion control is the challenge.    Daylyn stated that she has been dealing with increased stress due to moving and depression.     Diannia said that her Mom and her have been eating out a lot due to Mom's work schedule and being busy with preparing to move. Johnie said that she will do things when they go out to help with portion control such as putting half the food in a to go box and not eating much of the food at the table (e.g. chips) before the food comes.\  Stated that even if healthy feels she eats too much.  Stated that she doesn't typically eat until uncomfortably unless when she goes out to eat.  She explained that the Indicator that she ate too much is how much she has compared to someone else.  She said that her Dad and others used to comment on how much she ate  Esp. With eating disorder people would say stuff and it was hurtful. Holland has hx of binge eating disorder. Reports that she used to eat her feelings.    Joyelle expressed that she has been finding it hard to be motivated due to depression the last few months. Nivia denied any SI. Dalyah reports that she does have an appointment with a mental health provide on August 17th. Previously Leidi had been meeting with a counselor, however that discontinued due to the counselor's time constraints. Ardys said that she is motivated to get up and take the dogs out. Discussed incorporating a pleasurable activity into her day. Mahreen said that she does like to organize and clean. Given they are preparing to move this is something that she can do. Maddelynn said that she also volunteers at the [employer] where her mom works.  Kameryn said that she was doing well and then a few months ago she started feeling more depressed. She will be in her own head a lot about things from the past. She said she finds it challenging to spend time with her Dad b/c of how he treated her growing up; there is a lot of unspoken things. Cathyrn said that she takes medication for bipolar disorder.    Luevenia stated that there is increased stress due to moving. She said that her brother is moving out of the house that was their grandparent's house and in with their da. Shilpa and her mom are moving into the former grandparent's house. Latyra is glad to be leaving their apartment as she doesn't feel it is a safe neighborhood.     Leslee discussed some about how she had worked hard  to have the bariatric surgery, however was unable to do her cancer. Annaya said that her (Mom and her were exercising before.

## 2022-06-25 ENCOUNTER — Other Ambulatory Visit: Payer: Self-pay | Admitting: Psychiatry

## 2022-06-25 ENCOUNTER — Telehealth: Admit: 2022-06-25 | Discharge: 2022-06-26 | Payer: MEDICAID | Attending: Clinical | Primary: Clinical

## 2022-06-25 DIAGNOSIS — F4310 Post-traumatic stress disorder, unspecified: Secondary | ICD-10-CM

## 2022-06-25 DIAGNOSIS — Z6841 Body Mass Index (BMI) 40.0 and over, adult: Principal | ICD-10-CM

## 2022-06-25 DIAGNOSIS — Z724 Inappropriate diet and eating habits: Principal | ICD-10-CM

## 2022-06-25 DIAGNOSIS — F411 Generalized anxiety disorder: Secondary | ICD-10-CM

## 2022-06-25 NOTE — Unmapped (Signed)
[facility]  Behavioral Weight Management Progress Note       Intensive Behavioral Therapy for Weight Management     Jenna Mosley is a 31 y.o. female that presents for a visit today for behavioral weight management counseling.     Problem List        Other    Class 3 severe obesity with body mass index (BMI) of 45.0 to 49.9 in adult (CMS-HCC) - Primary    Inappropriate diet and eating habits     Appetite Awareness Training Treatment plan: stabilize eating patterns (eat 3-5 times/day, eating when hungry and stopping when comfortably full) before explicitly pursuing weight loss     Assessment date: 05/14/2022   Assessment weight: 358 lbs  SCOFF score:  2            Weight and BMI   As of this encounter, BMI is: 48.62.    Weight trend:         03/27/22 (!) 162.7 kg (358 lb 9.6 oz)   01/14/22 (!) 155.6 kg (343 lb)   12/26/21 (!) 158.1 kg (348 lb 8.8 oz)         ASSESS  Inappropriate diet and eating habits  Session #3:  Goals for this session:  1. Eat an afternoon snack 4x per week.    2. Do something pleasurable at least 2 days per week.     Reviewed goal from last session:   Goal: Eat a snack in the afternoon every day     Jenna Mosley stated that she has been having a snack typically 3x per week and that she has found it decreases her food cravings in the evening and that she tends to not eat as much. Jenna Mosley said that she has been cooking a little bit more, which she is expressed being proud of. She finds that if she cooks she will sometimes eat some of that for her snack.     Jenna Mosley met with the dietician for diabetes education. She said that she does have a fairly good understanding of what to eat for her diabetes, however portion control is the challenge.    Jenna Mosley stated that she has been dealing with increased stress due to moving and depression.     Jenna Mosley said that her Mom and her have been eating out a lot due to Mom's work schedule and being busy with preparing to move. Jenna Mosley said that she will do things when they go out to help with portion control such as putting half the food in a to go box and not eating much of the food at the table (e.g. chips) before the food comes.\  Stated that even if healthy feels she eats too much.  Stated that she doesn't typically eat until uncomfortably unless when she goes out to eat.  She explained that the Indicator that she ate too much is how much she has compared to someone else.  She said that her Dad and others used to comment on how much she ate  Esp. With eating disorder people would say stuff and it was hurtful. Jenna Mosley has hx of binge eating disorder. Reports that she used to eat her feelings.    Jenna Mosley expressed that she has been finding it hard to be motivated due to depression the last few months. Jenna Mosley denied any SI. Jenna Mosley reports that she does have an appointment with a mental health provide on August 17th. Previously Jenna Mosley had been meeting with a  counselor, however that discontinued due to the counselor's time constraints. Jenna Mosley said that she is motivated to get up and take the dogs out. Discussed incorporating a pleasurable activity into her day. Jenna Mosley said that she does like to organize and clean. Given they are preparing to move this is something that she can do. 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  Jenna Mosley said that she was doing well and then a few months ago she started feeling more depressed. She will be in her own head a lot about things from the past. She said she finds it challenging to spend time with her Dad b/c of how he treated her growing up; there is a lot of unspoken things. Jenna Mosley said that she takes medication for bipolar disorder.    Jenna Mosley stated that there is increased stress due to moving. She said that her brother is moving out of the house that was their grandparent's house and in with their da. Jenna Mosley and her mom are moving into the former grandparent's house. [name] is glad to be leaving their apartment as she doesn't feel it is a safe neighborhood.     Jenna Mosley discussed some about how she had worked hard to have the bariatric surgery, however was unable to do her cancer. Jenna Mosley said that her (Mom and her were exercising before.         ADVISE   Provider facilitated education and discussion with patient about:    ??? Nutrition: Creating snacks and on-the-go options that pair carbs with protein/healthy fat.     AGREE:   After reviewing patient progress on previous goals and this week???s material, patient and provider collaboratively developed patient goals, see goals update above.     ASSIST  Connection to services: None at this visit    Resources given: see education topics discussed; associated handouts given, if applicable.     ARRANGE/Next Steps  1) Please continue encouraging this Pt to reach their goals (see list above).   2) Next visit scheduled for: 07/16/2022      Appointment Format: Telehealth:     The patient reports they are currently: at home. I spent 30 minutes on the real-time audio and video with the patient on the date of service. I spent an additional 10 minutes on pre- and post-visit activities on the date of service.     The patient was physically located in [place] or a state in which I am permitted to provide care. The patient and/or parent/guardian understood that s/he may incur co-pays and cost sharing, and agreed to the telemedicine visit. The visit was reasonable and appropriate under the circumstances given the patient's presentation at the time.    The patient and/or parent/guardian has been advised of the potential risks and limitations of this mode of treatment (including, but not limited to, the absence of in-person examination) and has agreed to be treated using telemedicine. The patient's/patient's family's questions regarding telemedicine have been answered.     If the visit was completed in an ambulatory setting, the patient and/or parent/guardian has also been advised to contact their provider???s office for worsening conditions, and seek emergency medical treatment and/or call 911 if the patient deems either necessary.        [facility]  [place] [facility] [place] [facility]  [address], [place], [place] [address] ??? Telephone [phone] ??? Fax [phone]  [email or web address]

## 2022-06-30 ENCOUNTER — Telehealth (INDEPENDENT_AMBULATORY_CARE_PROVIDER_SITE_OTHER): Payer: No Typology Code available for payment source | Admitting: Psychiatry

## 2022-06-30 ENCOUNTER — Encounter: Payer: Self-pay | Admitting: Psychiatry

## 2022-06-30 DIAGNOSIS — F3174 Bipolar disorder, in full remission, most recent episode manic: Secondary | ICD-10-CM

## 2022-06-30 DIAGNOSIS — F4310 Post-traumatic stress disorder, unspecified: Secondary | ICD-10-CM

## 2022-06-30 DIAGNOSIS — F411 Generalized anxiety disorder: Secondary | ICD-10-CM

## 2022-06-30 MED ORDER — ZIPRASIDONE HCL 80 MG PO CAPS: ORAL_CAPSULE | ORAL | 3 refills | Status: AC

## 2022-06-30 MED ORDER — LAMOTRIGINE 200 MG PO TABS: 200.0000 mg | ORAL_TABLET | Freq: Two times a day (BID) | ORAL | 3 refills | Status: AC

## 2022-06-30 MED ORDER — TRAZODONE HCL 100 MG PO TABS: 200.0000 mg | ORAL_TABLET | Freq: Every day | ORAL | 3 refills | Status: AC

## 2022-06-30 MED ORDER — DIVALPROEX SODIUM ER 500 MG PO TB24: 500.0000 mg | ORAL_TABLET | Freq: Every day | ORAL | 3 refills | Status: AC

## 2022-06-30 NOTE — Progress Notes (Unsigned)
Virtual Visit via Video Lang  I connected with Cynthia Lang on 06/30/22 at  1:40 PM EDT by a video enabled telemedicine application and verified that I am speaking with the correct person using two identifiers.  Location Provider Location : [facility] Patient Location : Home  Participants: Patient , Provider    I discussed the limitations of evaluation and management by telemedicine and the availability of in person appointments. The patient expressed understanding and agreed to proceed.   I discussed the assessment and treatment plan with the patient. The patient was provided an opportunity to ask questions and all were answered. The patient agreed with the plan and demonstrated an understanding of the instructions.   The patient was advised to call back or seek an in-person evaluation if the symptoms worsen or if the condition fails to improve as anticipated.   Cynthia Lang  07/01/2022 8:54 PM Cynthia Lang  MRN:  [id number]  Chief Complaint:  Chief Complaint  Patient presents with   Follow-up: 31 year old Caucasian female with history of bipolar disorder, PTSD presented for medication management.   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  Patient today reports she and her family are planning to move to their ancestral home at [facility].  Patient reports her mother's brothers offered they could live there as long as she can maintain the place and this will be rent-free.  She is excited about that since that will help them a lot.  Her mother is also going to have a gastric bypass surgery soon.  Patient reports she has been a bit more anxious than usual due to all the packing and moving.  She however has been coping okay.  Currently does not  have a psychotherapist however is going to establish care with [facility].  Patient reports sleep is improved on the higher dosage of hydroxyzine.  Currently compliant on the Geodon, Lamictal, Depakote.  Denies side effects.  Patient denies any suicidality, homicidality or perceptual disturbances.  Patient denies any other concerns today.  Visit Diagnosis:    ICD-10-CM   1. Bipolar 1 disorder, manic, full remission (HCC)  F31.74 ziprasidone (GEODON) 80 MG capsule    traZODone (DESYREL) 100 MG tablet    lamoTRIgine (LAMICTAL) 200 MG tablet    divalproex (DEPAKOTE ER) 500 MG 24 hr tablet    2. PTSD (post-traumatic stress disorder)  F43.10 traZODone (DESYREL) 100 MG tablet    lamoTRIgine (LAMICTAL) 200 MG tablet    3. GAD (generalized anxiety disorder)  F41.1       Past Psychiatric History: Reviewed past psychiatric history from progress Lang on 10/08/2021.  Past Medical History:  Past Medical History:  Diagnosis Date   Depression    Diabetes mellitus, type II ([place])    Hypertension    Leukemia, chronic myeloid ([place]) 02/20/2021   PCOS (polycystic ovarian syndrome)    PTSD (post-traumatic stress disorder)     Past Surgical History:  Procedure Laterality Date   TONSILLECTOMY AND ADENOIDECTOMY     WISDOM TOOTH EXTRACTION      Family Psychiatric History: Reviewed family psychiatric history from progress Lang on 10/08/2021.  Family History:  Family History  Problem Relation Age of Onset   Depression Mother    Anxiety disorder Mother    Alcohol abuse  Father    Bipolar disorder Brother    Depression Brother    Anxiety disorder Brother     Social History: Reviewed social history from progress Lang on 10/08/2021. Social History   Socioeconomic History   Marital status: Single    Spouse name: Not on file   Number of children: 0   Years of education: 12   Highest education level: High school graduate  Occupational History   Not on file  Tobacco Use   Smoking status:  Former    Packs/day: 0.50    Years: 10.00    Total pack years: 5.00    Types: Cigarettes    Quit date: 2014    Years since quitting: 9.6   Smokeless tobacco: Never  Vaping Use   Vaping Use: Never used  Substance and Sexual Activity   Alcohol use: Not on file   Drug use: Never   Sexual activity: Not Currently  Other Topics Concern   Not on file  Social History Narrative   Not on file   Social Determinants of Health   Financial Resource Strain: Not on file  Food Insecurity: Not on file  Transportation Needs: Not on file  Physical Activity: Not on file  Stress: Not on file  Social Connections: Not on file    Allergies:  Allergies  Allergen Reactions   Naproxen Palpitations   Aripiprazole     Urinary retention  Urinary retention     Diphenhydramine Hcl     Mania   Fluphenazine    Gadobenate    Lisinopril     SOB, chest pain SOB, chest pain    Metoclopramide Other (See Comments)    Mania Mania    Ondansetron Other (See Comments)    Mania  Mania     Oxycodone-Acetaminophen     hives   Prednisone    Promethazine Other (See Comments)    Mania Mania     Metabolic Disorder Labs: No results found for: "HGBA1C", "MPG" No results found for: "PROLACTIN" Lab Results  Component Value Date   CHOL 165 10/18/2021   TRIG 156 (H) 10/18/2021   HDL 36 (L) 10/18/2021   CHOLHDL 4.6 10/18/2021   VLDL 31 10/18/2021   LDLCALC 98 10/18/2021   Lab Results  Component Value Date   TSH 0.504 10/18/2021   TSH 2.60 08/10/2013    Therapeutic Level Labs: No results found for: "LITHIUM" Lab Results  Component Value Date   VALPROATE 13 (L) 10/18/2021   No results found for: "CBMZ"  Current Medications: Current Outpatient Medications  Medication Sig Dispense Refill   ACCU-CHEK GUIDE test strip CHECK SUGARS BEFORE MEALS 3 TIMES DAILY FOR INSULIN DEPENDANT TYPE TWO DIABETES.     atenolol (TENORMIN) 100 MG tablet Take 100 mg by mouth daily.     bosutinib (BOSULIF)  500 MG tablet Take 500 mg by mouth daily with breakfast. Take with food.     CALTRATE 600+D3 600-20 MG-MCG TABS Take 1 tablet by mouth 2 (two) times daily.     cetirizine (ZYRTEC) 10 MG tablet Take 10 mg by mouth every morning.     clindamycin (CLEOCIN T) 1 % SWAB Apply topically 2 (two) times daily.     clobetasol (TEMOVATE) 0.05 % external solution Apply topically 2 (two) times daily as needed.     Continuous Blood Gluc Sensor (DEXCOM G6 SENSOR) MISC      cyclobenzaprine (FLEXERIL) 10 MG tablet Take by mouth.     ferrous sulfate 325 (65 FE)  MG EC tablet Take by mouth.     furosemide (LASIX) 20 MG tablet Take 20 mg by mouth daily.     glipiZIDE (GLUCOTROL XL) 10 MG 24 hr tablet Take 20 mg by mouth daily.     HUMULIN R U-500 KWIKPEN 500 UNIT/ML KwikPen Inject into the skin.     hydrochlorothiazide (HYDRODIURIL) 25 MG tablet Take by mouth.     hydrocortisone 2.5 % ointment Apply 1 application topically 2 (two) times daily.     hydrOXYzine (ATARAX) 50 MG tablet TAKE ONE (1) TO ONE AND ONE-HALF (1+1/2)TABLET BY MOUTH AT BEDTIME AS NEEDED FORANXIETY OR SLEEP 45 tablet 2   metFORMIN (GLUCOPHAGE) 1000 MG tablet Take by mouth.     norethindrone (AYGESTIN) 5 MG tablet Take 5 mg by mouth every morning.     NOVOFINE PEN NEEDLE 32G X 6 MM MISC Inject into the skin 3 (three) times daily.     pantoprazole (PROTONIX) 40 MG tablet Take 40 mg by mouth every morning.     pregabalin (LYRICA) 75 MG capsule TAKE 1 CAPSULE BY MOUTH EVERY MORNING and 2 EVERY EVENING     TRESIBA FLEXTOUCH 200 UNIT/ML FlexTouch Pen Inject into the skin.     triamcinolone ointment (KENALOG) 0.1 % Apply topically.     valsartan (DIOVAN) 160 MG tablet Take by mouth.     VICTOZA 18 MG/3ML SOPN SMARTSIG:0.3 Milliliter(s) SUB-Q Daily     divalproex (DEPAKOTE ER) 500 MG 24 hr tablet Take 1 tablet (500 mg total) by mouth at bedtime. 30 tablet 3   lamoTRIgine (LAMICTAL) 200 MG tablet Take 1 tablet (200 mg total) by mouth 2 (two) times daily.  60 tablet 3   traZODone (DESYREL) 100 MG tablet Take 2 tablets (200 mg total) by mouth at bedtime. 60 tablet 3   ziprasidone (GEODON) 80 MG capsule TAKE 1 CAPSULE BY MOUTH TWICE DAILY WITHFOOD. 60 capsule 3   No current facility-administered medications for this visit.     Musculoskeletal: Strength & Muscle Tone:  UTA Gait & Station:  Seated Patient leans: N/A  Psychiatric Specialty Exam: Review of Systems  Psychiatric/Behavioral:  The patient is nervous/anxious.   All other systems reviewed and are negative.   There were no vitals taken for this visit.There is no height or weight on file to calculate BMI.  General Appearance: Casual  Eye Contact:  Fair  Speech:  Normal Rate  Volume:  Normal  Mood:  Anxious  Affect:  Congruent  Thought Process:  Goal Directed and Descriptions of Associations: Intact  Orientation:  Full (Time, Place, and Person)  Thought Content: Logical   Suicidal Thoughts:  No  Homicidal Thoughts:  No  Memory:  Immediate;   Fair Recent;   Fair Remote;   Fair  Judgement:  Fair  Insight:  Fair  Psychomotor Activity:  Normal  Concentration:  Concentration: Fair and Attention Span: Fair  Recall:  [employer] of Knowledge: Fair  Language: Fair  Akathisia:  No  Handed:  Right  AIMS (if indicated): done  Assets:  Communication Skills Desire for Improvement Housing Social Support  ADL's:  Intact  Cognition: WNL  Sleep:  Fair   Screenings: AIMS    Flowsheet Row Video Visit from 06/30/2022 in [place] Video Visit from 04/03/2022 in [place] Video Visit from 01/31/2022 in [place] Total Score 0 0 0      GAD-7    Flowsheet Row Video Visit from  06/30/2022 in [place] Video Visit from 04/03/2022 in [facility] Video Visit from 01/31/2022 in [place] Office Visit from  12/31/2021 in [place]  Total GAD-7 Score '12 11 11 12      '$ PHQ2-9    Flowsheet Row Video Visit from 06/30/2022 in [place] Video Visit from 04/03/2022 in [place] Office Visit from 12/31/2021 in [place] Office Visit from 10/31/2021 in [place] Office Visit from 10/08/2021 in [place]  PHQ-2 Total Score 0 0 0 2 3  PHQ-9 Total Score -- -- -- 8 13      Flowsheet Row Video Visit from 06/30/2022 in [place] Video Visit from 04/03/2022 in [place] Office Visit from 12/31/2021 in [place] Low Risk Low Risk No Risk        Assessment and Plan: Cynthia Lang is a 31 year old Caucasian female, single, on disability, lives in [place] with her mother was evaluated by telemedicine today.  Patient with psychosocial stressors of relocating to a new home, with anxiety although she is coping well, will benefit from psychotherapy sessions.  Continue medications as noted below.  Plan Bipolar disorder in remission Geodon 80 mg p.o. twice daily Lamotrigine 200 mg p.o. twice daily Depakote ER 500 mg at bedtime Depakote level-10/18/2021-13-subtherapeutic.  PTSD-improving Patient to establish care with a new psychotherapist-[facility] Hydroxyzine 50-75 mg p.o. nightly as needed for sleep  GAD-improving Patient to establish care with therapist. Hydroxyzine 50-75 mg p.o. nightly as needed We will consider adding an SSRI as needed in the future.  Follow-up in clinic in 2 to 3 months or sooner in person.    Collaboration of Care: Collaboration of Care: Referral or follow-up with counselor/therapist AEB encouraged to establish care with a therapist.  Patient/Guardian was advised Release of Information must  be obtained prior to any record release in order to collaborate their care with an outside provider. Patient/Guardian was advised if they have not already done so to contact the registration department to sign all necessary forms in order for [place] to release information regarding their care.   Consent: Patient/Guardian gives verbal consent for treatment and assignment of benefits for services provided during this visit. Patient/Guardian expressed understanding and agreed to proceed.   This Lang was generated in part or whole with voice recognition software. Voice recognition is usually quite accurate but there are transcription errors that can and very often do occur. I apologize for any typographical errors that were not detected and corrected.      Ursula Alert, MD 07/01/2022, 8:54 PM

## 2022-07-02 ENCOUNTER — Other Ambulatory Visit: Admit: 2022-07-02 | Discharge: 2022-07-03 | Payer: MEDICAID

## 2022-07-02 ENCOUNTER — Ambulatory Visit: Admit: 2022-07-02 | Discharge: 2022-07-03 | Payer: MEDICAID

## 2022-07-02 DIAGNOSIS — C9210 Chronic myeloid leukemia, BCR/ABL-positive, not having achieved remission: Principal | ICD-10-CM

## 2022-07-02 LAB — COMPREHENSIVE METABOLIC PANEL
ALBUMIN: 3.7 g/dL (ref 3.4–5.0)
ALKALINE PHOSPHATASE: 157 U/L — ABNORMAL HIGH (ref 46–116)
ALT (SGPT): 13 U/L (ref 10–49)
ANION GAP: 12 mmol/L (ref 5–14)
AST (SGOT): 24 U/L (ref <=34)
BILIRUBIN TOTAL: 0.2 mg/dL — ABNORMAL LOW (ref 0.3–1.2)
BLOOD UREA NITROGEN: 13 mg/dL (ref 9–23)
BUN / CREAT RATIO: 14
CALCIUM: 9.6 mg/dL (ref 8.7–10.4)
CHLORIDE: 102 mmol/L (ref 98–107)
CO2: 20 mmol/L (ref 20.0–31.0)
CREATININE: 0.96 mg/dL — ABNORMAL HIGH
EGFR CKD-EPI (2021) FEMALE: 80 mL/min/{1.73_m2} (ref >=60)
GLUCOSE RANDOM: 282 mg/dL — ABNORMAL HIGH (ref 70–179)
POTASSIUM: 4.5 mmol/L (ref 3.4–4.8)
PROTEIN TOTAL: 7.2 g/dL (ref 5.7–8.2)
SODIUM: 134 mmol/L — ABNORMAL LOW (ref 135–145)

## 2022-07-02 LAB — CBC W/ AUTO DIFF
BASOPHILS ABSOLUTE COUNT: 0.1 10*9/L (ref 0.0–0.1)
BASOPHILS RELATIVE PERCENT: 0.8 %
EOSINOPHILS ABSOLUTE COUNT: 0.1 10*9/L (ref 0.0–0.5)
EOSINOPHILS RELATIVE PERCENT: 1.4 %
HEMATOCRIT: 37 % (ref 34.0–44.0)
HEMOGLOBIN: 12.4 g/dL (ref 11.3–14.9)
LYMPHOCYTES ABSOLUTE COUNT: 3.6 10*9/L (ref 1.1–3.6)
LYMPHOCYTES RELATIVE PERCENT: 39.5 %
MEAN CORPUSCULAR HEMOGLOBIN CONC: 33.5 g/dL (ref 32.0–36.0)
MEAN CORPUSCULAR HEMOGLOBIN: 27.8 pg (ref 25.9–32.4)
MEAN CORPUSCULAR VOLUME: 83 fL (ref 77.6–95.7)
MEAN PLATELET VOLUME: 9.1 fL (ref 6.8–10.7)
MONOCYTES ABSOLUTE COUNT: 0.3 10*9/L (ref 0.3–0.8)
MONOCYTES RELATIVE PERCENT: 3.8 %
NEUTROPHILS ABSOLUTE COUNT: 5 10*9/L (ref 1.8–7.8)
NEUTROPHILS RELATIVE PERCENT: 54.5 %
PLATELET COUNT: 226 10*9/L (ref 150–450)
RED BLOOD CELL COUNT: 4.46 10*12/L (ref 3.95–5.13)
RED CELL DISTRIBUTION WIDTH: 14.9 % (ref 12.2–15.2)
WBC ADJUSTED: 9.1 10*9/L (ref 3.6–11.2)

## 2022-07-02 NOTE — Unmapped (Addendum)
It was great to see you today!    Please continue to take your bosutinib daily.     We will see you back in clinic in 3 months and check your BCR-ABL at that time.     Please let us know if you have any questions or concerns before your next clinic visit.     Appointment on 07/02/2022   Component Date Value Ref Range Status    Sodium 07/02/2022 134 (L)  135 - 145 mmol/L Final    Potassium 07/02/2022 4.5  3.4 - 4.8 mmol/L Final    Chloride 07/02/2022 102  98 - 107 mmol/L Final    CO2 07/02/2022 20.0  20.0 - 31.0 mmol/L Final    Anion Gap 07/02/2022 12  5 - 14 mmol/L Final    BUN 07/02/2022 13  9 - 23 mg/dL Final    Creatinine 16/08/9603 0.96 (H)  0.60 - 0.80 mg/dL Final    BUN/Creatinine Ratio 07/02/2022 14   Final    eGFR CKD-EPI (2021) Female 07/02/2022 80  >=60 mL/min/1.56m2 Final    eGFR calculated with CKD-EPI 2021 equation in accordance with [employer] and [employer] of Nephrology Task Force recommendations.    Glucose 07/02/2022 282 (H)  70 - 179 mg/dL Final    Calcium 54/07/8118 9.6  8.7 - 10.4 mg/dL Final    Albumin 14/78/2956 3.7  3.4 - 5.0 g/dL Final    Total Protein 07/02/2022 7.2  5.7 - 8.2 g/dL Final    Total Bilirubin 07/02/2022 0.2 (L)  0.3 - 1.2 mg/dL Final    AST 21/30/8657 24  <=34 U/L Final    ALT 07/02/2022 13  10 - 49 U/L Final    Alkaline Phosphatase 07/02/2022 157 (H)  46 - 116 U/L Final    WBC 07/02/2022 9.1  3.6 - 11.2 10*9/L Final    RBC 07/02/2022 4.46  3.95 - 5.13 10*12/L Final    HGB 07/02/2022 12.4  11.3 - 14.9 g/dL Final    HCT 84/69/6295 37.0  34.0 - 44.0 % Final    MCV 07/02/2022 83.0  77.6 - 95.7 fL Final    MCH 07/02/2022 27.8  25.9 - 32.4 pg Final    MCHC 07/02/2022 33.5  32.0 - 36.0 g/dL Final    RDW 28/41/3244 14.9  12.2 - 15.2 % Final    MPV 07/02/2022 9.1  6.8 - 10.7 fL Final    Platelet 07/02/2022 226  150 - 450 10*9/L Final    Neutrophils % 07/02/2022 54.5  % Final    Lymphocytes % 07/02/2022 39.5  % Final    Monocytes % 07/02/2022 3.8  % Final Eosinophils % 07/02/2022 1.4  % Final    Basophils % 07/02/2022 0.8  % Final    Absolute Neutrophils 07/02/2022 5.0  1.8 - 7.8 10*9/L Final    Absolute Lymphocytes 07/02/2022 3.6  1.1 - 3.6 10*9/L Final    Absolute Monocytes 07/02/2022 0.3  0.3 - 0.8 10*9/L Final    Absolute Eosinophils 07/02/2022 0.1  0.0 - 0.5 10*9/L Final    Absolute Basophils 07/02/2022 0.1  0.0 - 0.1 10*9/L Final              RED ZONE Means: RED ZONE: Take action now!     You need to be seen right away  Symptoms are at a severe level of discomfort    Call 911 or go to your nearest  Hospital for help     - Bleeding that  will not stop    - Hard to breathe    - New seizure - Chest pain  - Fall or passing out  -Thoughts of hurting    yourself or others      Call 911 if you are going into the RED ZONE                  YELLOW ZONE Means:     Please call with any new or worsening symptom(s), even if not on this list.  Call [phone]  After hours, weekends, and holidays - you will reach a long recording with specific instructions, If not in an emergency such as above, please listen closely all the way to the end and choose the option that relates to your need.   You can be seen by a provider the same day through our Same Day Acute Care for Patients with Cancer program.      YELLOW ZONE: Take action today     Symptoms are new or worsening  You are not within your goal range for:    - Pain    - Shortness of breath    - Bleeding (nose, urine, stool, wound)    - Feeling sick to your stomach and throwing up    - Mouth sores/pain in your mouth or throat    - Hard stool or very loose stools (increase in       ostomy output)    - No urine for 12 hours    - Feeding tube or other catheter/tube issue    - Redness or pain at previous IV or port/catheter site    - Depressed or anxiety   - Swelling (leg, arm, abdomen,     face, neck)  - Skin rash or skin changes  - Wound issues (redness, drainage,    re-opened)  - Confusion  - Vision changes  - Fever >100.4 F or chills  - Worsening cough with mucus that is    green, yellow, or bloody  - Pain or burning when going to the    bathroom  - Home Infusion Pump Issue- call    [phone]         Call your healthcare provider if you are going into the YELLOW ZONE     GREEN ZONE Means:  Your symptoms are under controls  Continue to take your medicine as ordered  Keep all visits to the provider GREEN ZONE: You are in control  No increase or worsening symptoms  Able to take your medicine  Able to drink and eat    - DO NOT use MyChart messages to report red or yellow symptoms. Allow up to 3    business days for a reply.  -MyChart is for non-urgent medication refills, scheduling requests, or other general questions.         [id number] Rev. 05/16/2022  Approved by Oncology Patient Education Committee
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
## 2022-07-10 NOTE — Unmapped (Signed)
[facility] Clinical Assessment & Refill Coordination Note    Jenna Mosley, DOB: July 15, 1991  Phone: [phone] (home)     All above HIPAA information was verified with patient.     Was a Nurse, learning disability used for this call? No    Specialty Medication(s):   Hematology/Oncology: Bosulif     Current Outpatient Medications   Medication Sig Dispense Refill    acetone, urine, test (KETONE URINE TEST) Strp Dispense 100.  Use as needed. (Patient taking differently: Dispense 100.  Use as needed.) 100 strip 2    atenoloL (TENORMIN) 100 MG tablet Take 1 tablet (100 mg total) by mouth in the morning. 90 tablet 3    blood sugar diagnostic (ACCU-CHEK GUIDE TEST STRIPS) Strp Check sugars before meals three times for insulin dependent type two diabetes. 100 each 11    blood-glucose meter kit Use as instructed - pt prefers a larger monitor glucometer is available 1 each 0    blood-glucose meter,continuous (DEXCOM G6 RECEIVER) Misc Dispense DexCom G6 Receiver 1 each 3    blood-glucose sensor (DEXCOM G6 SENSOR) Devi Dispense DexCom G6 sensors; Use 1 sensor q 10 days 3 each 11    blood-glucose transmitter (DEXCOM G6 TRANSMITTER) Devi DexCom G6 transmitter; Use 1 every 90 days 1 each 3    bosutinib 500 mg Tab Take 1 tablet (500 mg total) by mouth daily. 30 tablet 5    calcium carbonate-vitamin D3 600 mg-20 mcg (800 unit) Tab Take 1 mg by mouth Two (2) times a day (at 8am and 12:00).      cetirizine (ZYRTEC) 10 MG tablet TAKE 1 TABLET BY MOUTH IN THE MORNING. 90 tablet 3    clindamycin (CLEOCIN T) 1 % Swab Apply to acne bumps twice per day until they resolve 60 each 11    clobetasoL (TEMOVATE) 0.05 % external solution Apply topically Two (2) times a day. For scaling or itching in scalp as needed 50 mL 4    divalproex ER (DEPAKOTE ER) 500 MG extended released 24 hr tablet TAKE 1 TABLET BY MOUTH AT BEDTIME 90 tablet 3    doxycycline (VIBRAMYCIN) 50 MG capsule Take 1 capsule (50 mg total) by mouth Two (2) times a day. Take with food, stay upright for at least 30 min, avoid taking with calcium supplements or high-calcium foods. Protect skin from the sun with SPF 50 sunscreen or protective clothing. 60 capsule 2    ferrous sulfate 325 (65 FE) MG tablet Take 1 tablet (325 mg total) by mouth in the morning.      furosemide (LASIX) 20 MG tablet Take 1 tablet (20 mg total) by mouth daily. 30 tablet 11    hydroCHLOROthiazide (HYDRODIURIL) 25 MG tablet TAKE 1 TABLET BY MOUTH IN THE MORNING. 90 tablet 3    hydrocortisone 2.5 % ointment Apply 1 application topically Two (2) times a day. To face until clear, then stop. 30 g 3    hydrOXYzine (VISTARIL) 50 MG capsule TAKE 1 CAPSULE BY MOUTH AT BEDTIME AS NEEDED TO IMPROVE SLEEP 90 capsule 3    insulin regular hum U-500 conc (HUMULIN R U-500, CONC, KWIKPEN) 500 unit/mL (3 mL) CONCENTRATED injection Inject 130 Units under the skin Two (2) times a day (30 minutes before a meal). 46.8 mL 3    lamoTRIgine (LAMICTAL) 200 MG tablet TAKE 1 TABLET BY MOUTH TWICE DAILY 180 tablet 3    lancets (ACCU-CHEK SOFTCLIX LANCETS) Misc Check sugar three times per day before meals  for insulin dependent type two diabetes.  E11.65 100 each 11    liraglutide (VICTOZA) injection pen Inject 0.3 mL (1.8 mg total) under the skin daily. 27 mL 3    metFORMIN (GLUCOPHAGE) 1000 MG tablet Take 1 tablet (1,000 mg total) by mouth in the morning and 1 tablet (1,000 mg total) in the evening. Take with meals. 180 tablet 3    norethindrone (AYGESTIN) 5 mg tablet TAKE ONE TABLET EVERY MORNING 30 tablet 11    pantoprazole (PROTONIX) 40 MG tablet TAKE 1 TABLET BY MOUTH IN THE MORNING. 90 tablet 3    pregabalin (LYRICA) 75 MG capsule TAKE 1 CAPSULE BY MOUTH IN THE MORNING AND 2 CAPSULES BY MOUTH IN THE EVENING. 270 capsule 0    traZODone (DESYREL) 100 MG tablet TAKE 2 TABLETS BY MOUTH AT BEDTIME 180 tablet 3    tretinoin (RETIN-A) 0.025 % cream Apply a pea-sized amount to the entire face *except for eyelids* two nights per week (spaced out) for two weeks. If your face is not too dry, increase by one additional night each week. Work up to every night. If your face becomes dry and irritated, take one or two nights off. 20 g 11    triamcinolone (KENALOG) 0.1 % ointment Apply topically Two (2) times a day. To your abdomen until clear, then stop. AVOID FACE 80 g 3    valsartan (DIOVAN) 160 MG tablet TAKE 1 TABLET BY MOUTH DAILY 90 tablet 3    ziprasidone (GEODON) 80 MG capsule Take 1 capsule (80 mg total) by mouth in the morning and 1 capsule (80 mg total) in the evening. Take with meals.       No current facility-administered medications for this visit.        Changes to medications: [name] reports no changes at this time.    Allergies   Allergen Reactions    Lisinopril Shortness Of Breath    Naproxen Nausea Only, Palpitations and Other (See Comments)     Chest palpitations and feels like flying    Aripiprazole Other (See Comments)     Inability to urinate      Fluphenazine      mental health problems    Lactase      Other reaction(s): Unknown    Metoclopramide      Mania    Prednisone Other (See Comments)     mania    Reglan [Metoclopramide Hcl] Other (See Comments)     Induces mania    Diphenhydramine Hcl Anxiety    Multihance [Gadobenate Dimeglumine] Nausea And Vomiting    Ondansetron Hcl Anxiety    Promethazine Anxiety       Changes to allergies: No    SPECIALTY MEDICATION ADHERENCE     Bosulif 500 mg: 7 days of medicine on hand       Medication Adherence    Patient reported X missed doses in the last month: 1  Specialty Medication: Bosulif 500 mg - 1 tab (500mg ) once daily  Patient is on additional specialty medications: No  Informant: patient                  Confirmed plan for next specialty medication refill: delivery by pharmacy  Refills needed for supportive medications: not needed          Specialty medication(s) dose(s) confirmed: Regimen is correct and unchanged.     Are there any concerns with adherence? No    Adherence counseling provided? Not needed  CLINICAL MANAGEMENT AND INTERVENTION      Clinical Benefit Assessment:    Do you feel the medicine is effective or helping your condition? Yes    Clinical Benefit counseling provided? Not needed    Adverse Effects Assessment:    Are you experiencing any side effects? No    Are you experiencing difficulty administering your medicine? No    Quality of Life Assessment:    Quality of Life    Rheumatology  Oncology  1. What impact has your specialty medication had on the reduction of your daily pain or discomfort level?: None  2. On a scale of 1-10, how would you rate your ability to manage side effects associated with your specialty medication? (1=no issues, 10 = unable to take medication due to side effects): 1  Dermatology  Cystic Fibrosis          How many days over the past month did your condition  keep you from your normal activities? For example, brushing your teeth or getting up in the morning. 0    Have you discussed this with your provider? Not needed    Acute Infection Status:    Acute infections noted within Epic:  No active infections  Patient reported infection: None    Therapy Appropriateness:    Is therapy appropriate and patient progressing towards therapeutic goals? Yes, therapy is appropriate and should be continued    DISEASE/MEDICATION-SPECIFIC INFORMATION      N/A    PATIENT SPECIFIC NEEDS     Does the patient have any physical, cognitive, or cultural barriers? No    Is the patient high risk? Yes, patient is taking oral chemotherapy. Appropriateness of therapy as been assessed    Does the patient require a Care Management Plan? No     SOCIAL DETERMINANTS OF HEALTH     At the [facility], we have learned that life circumstances - like trouble affording food, housing, utilities, or transportation can affect the health of many of our patients.   That is why we wanted to ask: are you currently experiencing any life circumstances that are negatively impacting your health and/or quality of life? No    Social Determinants of Health     Financial Resource Strain: Medium Risk (06/25/2022)    Overall Financial Resource Strain (CARDIA)     Difficulty of Paying Living Expenses: Somewhat hard   Internet Connectivity: Not on file   Food Insecurity: Food Insecurity Present (06/25/2022)    Hunger Vital Sign     Worried About Running Out of Food in the Last Year: Often true     Ran Out of Food in the Last Year: Sometimes true   Tobacco Use: Medium Risk (07/02/2022)    Patient History     Smoking Tobacco Use: Former     Smokeless Tobacco Use: Never     Passive Exposure: Not on file   Housing/Utilities: Low Risk  (06/25/2022)    Housing/Utilities     Within the past 12 months, have you ever stayed: outside, in a car, in a tent, in an overnight shelter, or temporarily in someone else's home (i.e. couch-surfing)?: No     Are you worried about losing your housing?: No     Within the past 12 months, have you been unable to get utilities (heat, electricity) when it was really needed?: No   Alcohol Use: Not At Risk (05/14/2021)    Alcohol Use     How often do you have a drink  containing alcohol?: Never     How many drinks containing alcohol do you have on a typical day when you are drinking?: Not on file     How often do you have 5 or more drinks on one occasion?: Not on file   Transportation Needs: No Transportation Needs (06/25/2022)    PRAPARE - Transportation     Lack of Transportation (Medical): No     Lack of Transportation (Non-Medical): No   Substance Use: Not on file   Health Literacy: Medium Risk (05/14/2021)    Health Literacy     : Sometimes   Physical Activity: Not on file   Interpersonal Safety: Not on file   Stress: Not on file   Intimate Partner Violence: Not on file   Depression: Not at risk (04/10/2021)    PHQ-2     PHQ-2 Score: 2   Recent Concern: Depression - At risk (04/02/2021)    PHQ-2     PHQ-2 Score: 3   Social Connections: Not on file       Would you be willing to receive help with any of the needs that you have identified today? Not applicable       SHIPPING     Specialty Medication(s) to be Shipped:   Hematology/Oncology: Bosulif    Other medication(s) to be shipped: No additional medications requested for fill at this time     Changes to insurance: No    Delivery Scheduled: Yes, Expected medication delivery date: 07/15/22.     Medication will be delivered via Same Day Courier to the confirmed prescription address in [facility].    The patient will receive a drug information handout for each medication shipped and additional FDA Medication Guides as required.  Verified that patient has previously received a Conservation officer, historic buildings and a Surveyor, mining.    The patient or caregiver noted above participated in the development of this care plan and knows that they can request review of or adjustments to the care plan at any time.      All of the patient's questions and concerns have been addressed.    Tinea Nobile Vangie Bicker   [facility] Specialty Pharmacist

## 2022-07-15 MED FILL — BOSULIF 500 MG TABLET: ORAL | 30 days supply | Qty: 30 | Fill #5

## 2022-07-16 ENCOUNTER — Ambulatory Visit: Admit: 2022-07-16 | Payer: MEDICAID | Attending: Clinical | Primary: Clinical

## 2022-07-16 NOTE — Unmapped (Addendum)
Attempted to reach the Pt (Ms. Jenna Mosley) via telephone when the Pt did not arrive for the scheduled weight management counseling video visit. Also sent MyChart text message with video link to the patient. Left VM with the Weight Management contact information.    Deedra Ehrich, LCSW, NCTTP  Telephone: [phone]

## 2022-07-16 NOTE — Unmapped (Signed)
Session# 4

## 2022-07-17 MED ORDER — ATENOLOL 100 MG TABLET
ORAL_TABLET | Freq: Every morning | ORAL | 3 refills | 90 days | Status: CP
Start: 2022-07-17 — End: ?

## 2022-07-17 NOTE — Unmapped (Signed)
Patient is requesting the following refill  Requested Prescriptions     Pending Prescriptions Disp Refills    atenoloL (TENORMIN) 100 MG tablet [Pharmacy Med Name: ATENOLOL 100 MG TAB] 90 tablet 3     Sig: TAKE 1 TABLET BY MOUTH IN THE MORNING.       Order pended. Please advise. Thanks    Last OV: 11/26/2021   Next OV: 08/26/2022        To pharmacy: PT NEEDS REFILLS **BUBBLE PACK PT**

## 2022-07-18 MED ORDER — FUROSEMIDE 20 MG TABLET
ORAL_TABLET | Freq: Every day | ORAL | 11 refills | 30 days | Status: CP
Start: 2022-07-18 — End: 2023-07-18

## 2022-07-18 NOTE — Unmapped (Signed)
Requested Prescriptions     Pending Prescriptions Disp Refills    furosemide (LASIX) 20 MG tablet 30 tablet 11     Sig: Take 1 tablet (20 mg total) by mouth daily.

## 2022-07-23 ENCOUNTER — Institutional Professional Consult (permissible substitution): Admit: 2022-07-23 | Discharge: 2022-07-24 | Payer: MEDICAID

## 2022-07-23 ENCOUNTER — Ambulatory Visit: Admit: 2022-07-23 | Discharge: 2022-07-24 | Payer: MEDICAID

## 2022-07-23 DIAGNOSIS — C9210 Chronic myeloid leukemia, BCR/ABL-positive, not having achieved remission: Principal | ICD-10-CM

## 2022-07-23 MED ADMIN — leuprolide (LUPRON) injection 11.25 mg: 11.25 mg | INTRAMUSCULAR | @ 19:00:00 | Stop: 2022-07-23

## 2022-07-23 NOTE — Unmapped (Signed)
Pt to clinic for scheduled Lupron injection- given IM to right gluteal as ordered. Pt tolerated well- discharged off unit ambulatory to home to self care.

## 2022-08-01 DIAGNOSIS — C9210 Chronic myeloid leukemia, BCR/ABL-positive, not having achieved remission: Principal | ICD-10-CM

## 2022-08-01 MED ORDER — BOSULIF 500 MG TABLET
ORAL_TABLET | Freq: Every day | ORAL | 5 refills | 30 days
Start: 2022-08-01 — End: ?

## 2022-08-04 MED ORDER — BOSUTINIB 500 MG TABLET
ORAL_TABLET | Freq: Every day | ORAL | 5 refills | 30 days | Status: CP
Start: 2022-08-04 — End: ?
  Filled 2022-08-13: qty 30, 30d supply, fill #0

## 2022-08-05 ENCOUNTER — Ambulatory Visit: Admit: 2022-08-05 | Payer: MEDICAID

## 2022-08-05 MED ORDER — DEXCOM G6 TRANSMITTER DEVICE
3 refills | 0 days | Status: CP
Start: 2022-08-05 — End: ?
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
## 2022-08-06 NOTE — Unmapped (Signed)
[facility] Refill Coordination Note    Specialty Medication(s) to be Shipped:   Hematology/Oncology: Bosulif    Other medication(s) to be shipped: No additional medications requested for fill at this time     Jenna Mosley, DOB: 02-Aug-1991  Phone: [phone] (home)       All above HIPAA information was verified with patient.     Was a Nurse, learning disability used for this call? No    Completed refill call assessment today to schedule patient's medication shipment from the [facility] [phone]).  All relevant notes have been reviewed.     Specialty medication(s) and dose(s) confirmed: Regimen is correct and unchanged.   Changes to medications: Jenna Mosley reports no changes at this time.  Changes to insurance: No  New side effects reported not previously addressed with a pharmacist or physician: None reported  Questions for the pharmacist: No    Confirmed patient received a Conservation officer, historic buildings and a Surveyor, mining with first shipment. The patient will receive a drug information handout for each medication shipped and additional FDA Medication Guides as required.       DISEASE/MEDICATION-SPECIFIC INFORMATION        N/A    SPECIALTY MEDICATION ADHERENCE     Medication Adherence    Patient reported X missed doses in the last month: 2  Specialty Medication: Bosulif 500 mg  Patient is on additional specialty medications: No  Informant: patient                                Were doses missed due to medication being on hold? No    Bosulif 500 mg: 11 days of medicine on hand     REFERRAL TO PHARMACIST     Referral to the pharmacist: Not needed      [facility]     Shipping address confirmed in Epic.     Delivery Scheduled: Yes, Expected medication delivery date: 08/14/22.     Medication will be delivered via UPS to the temporary address in Epic WAM.    Jenna Mosley   [facility] Specialty Technician

## 2022-08-15 ENCOUNTER — Ambulatory Visit: Admit: 2022-08-15 | Payer: MEDICAID

## 2022-08-26 ENCOUNTER — Ambulatory Visit
Admit: 2022-08-26 | Discharge: 2022-08-27 | Payer: MEDICAID | Attending: Geriatric Medicine | Primary: Geriatric Medicine

## 2022-08-26 DIAGNOSIS — I10 Essential (primary) hypertension: Principal | ICD-10-CM

## 2022-08-26 DIAGNOSIS — E11621 Type 2 diabetes mellitus with foot ulcer: Principal | ICD-10-CM

## 2022-08-26 DIAGNOSIS — G8929 Other chronic pain: Principal | ICD-10-CM

## 2022-08-26 DIAGNOSIS — F319 Bipolar disorder, unspecified: Principal | ICD-10-CM

## 2022-08-26 DIAGNOSIS — C9210 Chronic myeloid leukemia, BCR/ABL-positive, not having achieved remission: Principal | ICD-10-CM

## 2022-08-26 DIAGNOSIS — Z23 Encounter for immunization: Principal | ICD-10-CM

## 2022-08-26 DIAGNOSIS — E785 Hyperlipidemia, unspecified: Principal | ICD-10-CM

## 2022-08-26 DIAGNOSIS — Z6841 Body Mass Index (BMI) 40.0 and over, adult: Principal | ICD-10-CM

## 2022-08-26 DIAGNOSIS — L97515 Non-pressure chronic ulcer of other part of right foot with muscle involvement without evidence of necrosis: Principal | ICD-10-CM

## 2022-08-26 DIAGNOSIS — Z794 Long term (current) use of insulin: Principal | ICD-10-CM

## 2022-08-26 DIAGNOSIS — R7989 Other specified abnormal findings of blood chemistry: Principal | ICD-10-CM

## 2022-08-26 DIAGNOSIS — M5442 Lumbago with sciatica, left side: Principal | ICD-10-CM

## 2022-08-26 DIAGNOSIS — F419 Anxiety disorder, unspecified: Principal | ICD-10-CM

## 2022-08-26 DIAGNOSIS — M5441 Lumbago with sciatica, right side: Principal | ICD-10-CM

## 2022-08-26 DIAGNOSIS — E1140 Type 2 diabetes mellitus with diabetic neuropathy, unspecified: Principal | ICD-10-CM

## 2022-08-26 LAB — HEMOGLOBIN A1C
ESTIMATED AVERAGE GLUCOSE: 229 mg/dL
HEMOGLOBIN A1C: 9.6 % — ABNORMAL HIGH (ref 4.8–5.6)

## 2022-08-26 LAB — LIPID PANEL
CHOLESTEROL/HDL RATIO SCREEN: 5.5 — ABNORMAL HIGH (ref 1.0–4.5)
CHOLESTEROL: 166 mg/dL (ref <=200)
HDL CHOLESTEROL: 30 mg/dL — ABNORMAL LOW (ref 40–60)
LDL CHOLESTEROL CALCULATED: 81 mg/dL (ref 40–99)
NON-HDL CHOLESTEROL: 136 mg/dL — ABNORMAL HIGH (ref 70–130)
TRIGLYCERIDES: 274 mg/dL — ABNORMAL HIGH (ref 0–150)
VLDL CHOLESTEROL CAL: 54.8 mg/dL — ABNORMAL HIGH (ref 8–32)

## 2022-08-26 LAB — ALBUMIN / CREATININE URINE RATIO
ALBUMIN QUANT URINE: 1.2 mg/dL
ALBUMIN/CREATININE RATIO: 42 ug/mg — ABNORMAL HIGH (ref 0.0–30.0)
CREATININE, URINE: 28.6 mg/dL

## 2022-08-26 LAB — TSH: THYROID STIMULATING HORMONE: 2.107 u[IU]/mL (ref 0.550–4.780)

## 2022-08-26 MED ORDER — MUPIROCIN 2 % TOPICAL OINTMENT
Freq: Two times a day (BID) | TOPICAL | 1 refills | 0 days | Status: CP
Start: 2022-08-26 — End: 2022-09-02

## 2022-08-26 MED ORDER — AMOXICILLIN 875 MG-POTASSIUM CLAVULANATE 125 MG TABLET
ORAL_TABLET | Freq: Two times a day (BID) | ORAL | 0 refills | 7 days | Status: CP
Start: 2022-08-26 — End: 2022-09-02
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
## 2022-08-26 NOTE — Unmapped (Signed)
Attempt made to reach patient to determine needed for case management. Left message to call back. Pt scheduled for appt with PCP. Will follow up with PCP to determine needs.

## 2022-08-27 ENCOUNTER — Ambulatory Visit: Admit: 2022-08-27 | Discharge: 2022-08-28 | Payer: MEDICAID

## 2022-08-27 DIAGNOSIS — E782 Mixed hyperlipidemia: Principal | ICD-10-CM

## 2022-08-27 DIAGNOSIS — Z794 Long term (current) use of insulin: Principal | ICD-10-CM

## 2022-08-27 DIAGNOSIS — E1165 Type 2 diabetes mellitus with hyperglycemia: Principal | ICD-10-CM

## 2022-08-27 MED ORDER — MOUNJARO 2.5 MG/0.5 ML SUBCUTANEOUS PEN INJECTOR
SUBCUTANEOUS | 0 refills | 28 days | Status: CP
Start: 2022-08-27 — End: 2022-09-18

## 2022-08-27 NOTE — Unmapped (Signed)
No meter and pump downloaded.      POC glucose done today.    POCG RESULT: 240 MG/DL    PP: 8:11 am     BJYN8G RESULT: 9.6 (08/26/2022)
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
## 2022-08-27 NOTE — Unmapped (Signed)
Last set of labs show good response to treatment.  Follow-up with oncology.

## 2022-08-27 NOTE — Unmapped (Signed)
Dear [name],  Diabetes test shows your A1c is not very well controlled.  Please make sure to go to your endocrine appointment to discuss management of your diabetes. Cholesterol is elevated and you would benefit from starting a statin to control your cholesterol.  I will let you discuss this with the endocrinologist as well.  Blood chemistry and renal function is stable.  Still waiting on some additional labs.    Willia Craze, MD MPH  [facility]  [facility]  [address], [place], [place] [address]  p [phone]

## 2022-08-27 NOTE — Unmapped (Addendum)
Looks like a large blister which has burst.  Need to manage aggressively since has some neuropathy in feet.  Mom will monitor daily.  Rec mupirocin on clean dry feet bid and augmentin.  Cover feet with clean white socks.  Wear protection and do not walk barefoot even indoors.  Consider referral to podiatry for diabetic shoes.

## 2022-08-27 NOTE — Unmapped (Signed)
Continue current meds.  Patient needs follow up with psych and therapist.  Living with mom should help.

## 2022-08-27 NOTE — Unmapped (Signed)
Continue current mood stabilizers.  Patient needs follow up with psych and with therapist.

## 2022-08-27 NOTE — Unmapped (Signed)
Bp under good control taking valsartan 160mg  daily, atenolol 100mg  daily, hydrochlorothiazide 25mg  daily, and furosemide 40 mg every other day.

## 2022-08-27 NOTE — Unmapped (Signed)
BMI back up to 49.  Has been eating out a lot with recent move from apt to grand mother's home (living with mom). We will continue to monitor. Follow up with nutritionist.  Message sent to endocrine to consider adding in mounjaro for mgt of diabetes and weight.  May also help compliance with a once weekly drug.   Would like to see patient lose weight in general however need to make sure she maintains muscle mass by eating a diet with healthy lean proteins.

## 2022-08-27 NOTE — Unmapped (Signed)
Can try heat alternating with ice.  Lidocaine patch, biofreeze or aspercreme.  Possibly related to weight gain.  Will get xr for further evaluation especially given radiation of pain bilaterally.

## 2022-08-27 NOTE — Unmapped (Signed)
Dear [name],  You have degenerative disc disease in your mid to lower spine likely causing your symptoms.  We can start with physical therapy and if that does not help we can refer you to the spine clinic for further evaluation.  Please let me know if you would like a referral to physical therapy.  You can also try heat alternating with back.  Weight loss will also help with your symptoms.    Jenna Craze, MD MPH  [facility]  [facility]  [address], [place], [place] [address]  p [phone]

## 2022-08-28 LAB — VITAMIN D 25 HYDROXY: VITAMIN D, TOTAL (25OH): 30.3 ng/mL (ref 20.0–80.0)

## 2022-08-29 NOTE — Unmapped (Signed)
Prior Authorization for [employer] Approved via [employer]    Confirmation [employer] W  Prior Approval [phone]  Status: APPROVED  Effective: 0/10/2022 - 02/24/2023    Transmitter    Confirmation #:[id number] W  Prior Approval #:[id number]  Status: APPROVED  Effective: 0/10/2022 - 02/24/2023

## 2022-09-04 NOTE — Unmapped (Signed)
[facility] Refill Coordination Note    Specialty Medication(s) to be Shipped:   Hematology/Oncology: Bosulif    Other medication(s) to be shipped: No additional medications requested for fill at this time     Jenna Mosley, DOB: Apr 19, 1991  Phone: [phone] (home)       All above HIPAA information was verified with patient.     Was a Nurse, learning disability used for this call? No    Completed refill call assessment today to schedule patient's medication shipment from the [facility] [phone]).  All relevant notes have been reviewed.     Specialty medication(s) and dose(s) confirmed: Regimen is correct and unchanged.   Changes to medications: Hidie reports no changes at this time.  Changes to insurance: No  New side effects reported not previously addressed with a pharmacist or physician: None reported  Questions for the pharmacist: No    Confirmed patient received a Conservation officer, historic buildings and a Surveyor, mining with first shipment. The patient will receive a drug information handout for each medication shipped and additional FDA Medication Guides as required.       DISEASE/MEDICATION-SPECIFIC INFORMATION        N/A    SPECIALTY MEDICATION ADHERENCE     Medication Adherence    Patient reported X missed doses in the last month: 0  Specialty Medication: Bosulif 500 mg  Patient is on additional specialty medications: No  Informant: patient                          Were doses missed due to medication being on hold? No    Bosulif 500 mg: 12 days of medicine on hand        REFERRAL TO PHARMACIST     Referral to the pharmacist: Not needed      [facility]     Shipping address confirmed in Epic.     Delivery Scheduled: Yes, Expected medication delivery date: 09/10/22.     Medication will be delivered via UPS to the temporary address in Epic WAM.    Willette Pa   [facility] Specialty Technician

## 2022-09-09 MED FILL — BOSULIF 500 MG TABLET: ORAL | 30 days supply | Qty: 30 | Fill #1

## 2022-09-18 ENCOUNTER — Ambulatory Visit
Admit: 2022-09-18 | Discharge: 2022-09-19 | Payer: MEDICAID | Attending: Geriatric Medicine | Primary: Geriatric Medicine

## 2022-09-18 ENCOUNTER — Ambulatory Visit: Admit: 2022-09-18 | Discharge: 2022-09-19 | Payer: MEDICAID

## 2022-09-18 MED ORDER — FUROSEMIDE 40 MG TABLET
ORAL_TABLET | Freq: Every day | ORAL | 3 refills | 90 days | Status: CP
Start: 2022-09-18 — End: 2023-09-18

## 2022-09-18 MED ORDER — PREGABALIN 75 MG CAPSULE
ORAL_CAPSULE | 0 refills | 0 days | Status: CP
Start: 2022-09-18 — End: ?

## 2022-09-18 MED ORDER — BUDESONIDE-FORMOTEROL HFA 80 MCG-4.5 MCG/ACTUATION AEROSOL INHALER
Freq: Two times a day (BID) | RESPIRATORY_TRACT | 5 refills | 36 days | Status: CP
Start: 2022-09-18 — End: 2023-09-18

## 2022-09-18 MED ORDER — LIDOCAINE 5 % TOPICAL PATCH
MEDICATED_PATCH | TRANSDERMAL | 5 refills | 30 days | Status: CP
Start: 2022-09-18 — End: 2023-09-18

## 2022-09-18 MED ORDER — DICLOFENAC 1 % TOPICAL GEL
Freq: Four times a day (QID) | TOPICAL | 1 refills | 19 days | Status: CP
Start: 2022-09-18 — End: 2023-09-18

## 2022-09-18 NOTE — Unmapped (Signed)
Patient ID: Jenna Mosley is a 31 y.o. female who presents for new concerns of shortness of breath.  Informant: Patient is accompanied by mom.  Assessment/Plan:   Shortness of breath  Assessment & Plan:  Patient needs follow-up with pulmonary.  Walk with patient in clinic for about 4 minutes with oxygen maintaining above 93% throughout (started at 96%).  No tachycardia noted.  Likely this is restrictive secondary to weight gain.  Patient has had a dramatic weight gain of about 50 pounds over the past 6 months.  Will increase furosemide in case this may be due to some volume increase to 40 mg daily.  Patient will get BMP checked in a couple of weeks at oncology check potassium.  We will start Symbicort.  Advised patient to cut down on sodium intake.  Advised to increase activity level.  Can exercise with a pulse oximeter which is relatively affordable off [employer] to get some reassurance that patient is maintaining oxygenation.  I thought she did relatively well while ambulating.  We will get a chest x-ray to evaluate for pulmonary edema.    Orders:  -     XR Chest 2 views; Future  -     Comprehensive Metabolic Panel; Future    Chronic midline low back pain without sciatica  Assessment & Plan:  Can try heat alternating with ice.  Lidocaine patch, biofreeze or aspercreme.  Possibly related to weight gain.  Evidence of degenerative changes on x-ray.  Refer to spine clinic.      Orders:  -     Ambulatory referral to Sp[facility] Center; Future    Class 3 severe obesity with body mass index (BMI) of 45.0 to 49.9 in adult, unspecified obesity type, unspecified whether serious comorbidity present (CMS-HCC)  Assessment & Plan:  Weight gain has been dramatic over the past few months.  Possible that some of this is fluid retention however likely it is weight gain.  Discussed possibly adding in naltrexone to decrease cravings.  Patient recently started on with Dara which hopefully should help.  Continue to monitor.        Other orders  -     furosemide; Take 1 tablet (40 mg total) by mouth daily.  -     budesonide-formoteroL; Inhale 1 puff two (2) times a day.  -     lidocaine; Place 1 patch on the skin daily. Apply to affected area for 12 hours only each day (then remove patch)  -     diclofenac sodium; Apply 4 g topically four (4) times a day.       Preventive services addressed today  Preventive services are currently up to date  -- Patient verbalized an understanding of today's assessment and recommendations, as well as the purpose of ongoing medications.  Return in about 2 weeks (around 10/02/2022).   I personally spent 35 minutes face-to-face and non-face-to-face in the care of this patient, which includes all pre, intra, and post visit time on the date of service.  All documented time was specific to the E/M visit and does not include any procedures that may have been performed.    Subjective:   HPI  History of Present Illness  The patient presents for evaluation of multiple medical concerns. She is accompanied by her mother.    She called to schedule an appointment with the pulmonologist, but they can not see her until 12/06/2022. She was given furosemide because she had fluid buildup in her lungs and was hospitalized  for that. She feels short of breath all the time. She feels short of breath after she eats, lays down, exerts herself, or bends over to pick something up off the floor. She gets winded just walking through the house and has to stop sometimes and breathe heavily for a minute. 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 If she lays on her side and pulls her arm straight by her side, she can breathe okay. She elevates several pillows. She was on inhalers, but when she started having clear breathing and was not having issues with feeling like this, they told her she did not have asthma, so she went off the inhalers and felt fine. She thinks her shortness of breath is due to her weight. She has doubled the furosemide. Dr. Smith Robert had her on furosemide every other day and then went off of it, but her breathing problem started coming back, so he put her on it every day. She has a lot of urine output when she takes furosemide. Yesterday, she weighed 382 and today she weighs 378. Her mother states the fluid buildup comes and goes. She has been on Tylenol for a long time and one of the cancer medicines made it a whole lot worse and her feet were swelling, so they took her off that one. She wants to try to get back to exercising because she can not breathe enough exercise. Yesterday, she was shining on some clothes and could not even try clothes at home because she got short of breath and had to sit down. She probably takes a lot of sodium in her diet. 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 She does not drink detest water. She drinks 100 percent diet sodas. Her urine is not brown or dark yellow in color. She occasionally drinks a glass of water if she goes somewhere and does not get soda. 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 She can not eat later because of her jaw. She just started therapy with a therapist.    She has pain in her back. 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 Her mother states a lot of days she sleeps all day. She thought it was a weight issue. Her mother and grandfather had spinal stenosis. Her brother has degenerative disc disease. She has horrible joint issues. She is not ready for the injections. She has been to [facility] in the past for her neck. She had a work injury and a box fell on her when she was 31 years old. She lost almost the whole use of her arms and could not feel her arms. She was told it was because of the way she sits. She was trying to get her into a pain clinic, but they were booked up all the time and she could not get in. She sometimes does not even notice the pain because she has been in pain for so long from the neck injury. Dr. Smith Robert thought she had a herniated disc in her neck. They did scans and told her it was arthritis. She has used lidocaine patches. She has tried Voltaren gel.    She was switched from Victoza to [place]. She has been on the [facility] for 2 weeks. She is starting again on the lowest dosage of Mounjaro.    She is on furosemide.    She called  to schedule an appointment with the pulmonologist, but they can not see her until 12/06/2022.    Her blood pressure today is 130/72. She thinks it is because of the pain.    She has TMJ.    She can not take Wellbutrin.    Supplemental Information  She has TMJ.   Her mother and grandfather had spinal stenosis. Her brother has degenerative disc disease.    Patient has been evaluated for shortness of breath in the past by pulmonary.-Shortness of breath improved with weight loss. Prior pulmonary function testing revealed small airway disease and mild restrictive lung disease.  In the past she had some associated chest pain, but has been evaluated by multiple cardiologists for this.  She she was placed on Symbicort however stopped them because after weight loss she did not derive any benefit from them. She has no evidence of obesity hypoventilation syndrome (pCO2 42 in 05/2021, normal bicarbonate on labs) or sleep apnea on her most recent sleep study.   No reported exertional hypoxemia (and improving exertional dyspnea); sleep studies in the past without significant desaturation (O2 nadir 89% in 2020)  Recommendations were for follow-up one year with spirometry and lung volumes by nitrogen washout.    In the past, the goal is to help her feel better symptomatically so that she can start moving around more and exercising more which will help eventually reduce her weight.  It was thought her weight is one of the other major causes of her restrictive lung physiology causing shortness of breath.Patient was unable to perform lung volumes in the past secondary to her body habitus.   Patient was referred for pulmonary rehab for her restrictive lung physiology but did not go due to limitations with insurance.    ROS  A comprehensive review of systems was conducted with negative results except as noted in the HPI above.            Objective:   Vital Signs  BP 130/72 (BP Site: L Arm, BP Position: Sitting, BP Cuff Size: Large)  - Pulse 70  - Ht 182.9 cm (6' 0.01)  - Wt (!) 171.7 kg (378 lb 9.6 oz)  - SpO2 98%  - Breastfeeding No  - BMI 51.34 kg/m??  Body mass index is 51.34 kg/m??.  Exam  Physical Exam  Constitutional:       Appearance: She is obese.   HENT:      Head: Normocephalic and atraumatic.      Right Ear: Tympanic membrane and ear canal normal.      Left Ear: Tympanic membrane and ear canal normal.   Cardiovascular:      Rate and Rhythm: Normal rate and regular rhythm.   Pulmonary:      Effort: Pulmonary effort is normal. No respiratory distress.      Breath sounds: Normal breath sounds. No wheezing, rhonchi or rales.   Musculoskeletal:      Right lower leg: Edema present.      Left lower leg: Edema present.      Comments: Trace bilateral edema   Lymphadenopathy:      Cervical: No cervical adenopathy.   Skin:     General: Skin is warm.   Neurological:      Mental Status: She is alert.   Psychiatric:         Mood and Affect: Mood normal.         Behavior: Behavior normal.         Thought Content:  Thought content normal.             I've personally reviewed and summarized records in EPIC/Media and via CareEverywhere as well as medication, allergies, past medical, social, and family history.   I have reviewed the patient's medical history in detail and updated the computerized patient record.    Note - This chart has been prepared using the Dragon voice recognition system. Typographical errors may have occurred.  Attempts have been made to correct errors, however, inadvertent errors may persist.

## 2022-09-19 MED ORDER — FUROSEMIDE 20 MG TABLET
ORAL_TABLET | Freq: Every day | ORAL | 3 refills | 90 days | Status: CP
Start: 2022-09-19 — End: 2023-09-19

## 2022-09-19 NOTE — Unmapped (Signed)
Can try heat alternating with ice.  Lidocaine patch, biofreeze or aspercreme.  Possibly related to weight gain.  Evidence of degenerative changes on x-ray.  Refer to spine clinic.

## 2022-09-19 NOTE — Unmapped (Signed)
Patient needs follow-up with pulmonary.  Walk with patient in clinic for about 4 minutes with oxygen maintaining above 93% throughout (started at 96%).  No tachycardia noted.  Likely this is restrictive secondary to weight gain.  Patient has had a dramatic weight gain of about 50 pounds over the past 6 months.  Will increase furosemide in case this may be due to some volume increase to 40 mg daily.  Patient will get BMP checked in a couple of weeks at oncology check potassium.  We will start Symbicort.  Advised patient to cut down on sodium intake.  Advised to increase activity level.  Can exercise with a pulse oximeter which is relatively affordable off [employer] to get some reassurance that patient is maintaining oxygenation.  I thought she did relatively well while ambulating.  We will get a chest x-ray to evaluate for pulmonary edema.

## 2022-09-19 NOTE — Unmapped (Addendum)
Received a call from [facility], they have received two different orders for furosemide for this patient, one for 40mg  and the other for 20mg . Most recent fax appears to be for 20mg  furosemide. The patient is stating they thought they were supposed to be on 40mg  furosemide. Spoke to Dr. Chanetta Marshall, Dr. Chanetta Marshall confirmed that patient is to be on 20mg  furosemide as her chest x-ray came back clear and shows no fluid. Dr Chanetta Marshall would like patient to stay on furosemid 20 and not increase to 40 mg as too much would be bad for her kidneys. Called [facility] and notified them that per Dr. Chanetta Marshall patient is to stay on 20mg  furosemide.

## 2022-09-19 NOTE — Unmapped (Signed)
Weight gain has been dramatic over the past few months.  Possible that some of this is fluid retention however likely it is weight gain.  Discussed possibly adding in naltrexone to decrease cravings.  Patient recently started on with Dara which hopefully should help.  Continue to monitor.

## 2022-09-19 NOTE — Unmapped (Signed)
Your chest x-ray is clear.  There is no fluid.  There is just low lung volumes likely due to the weight in your abdomen.  You had a 50 pound weight gain over the past 6 months which will affect your breathing because it is predominantly in your abdomen.  I am going to change your furosemide back down to 20 because I think it would be dangerous giving you too much as this is likely not a fluid issue but of weight issue.  Hopefully the [facility] will help.  I will send a new prescription to your pharmacy.

## 2022-09-23 ENCOUNTER — Ambulatory Visit: Admit: 2022-09-23 | Discharge: 2022-09-24 | Payer: MEDICAID

## 2022-09-23 DIAGNOSIS — R509 Fever, unspecified: Principal | ICD-10-CM

## 2022-09-23 DIAGNOSIS — J029 Acute pharyngitis, unspecified: Principal | ICD-10-CM

## 2022-09-23 MED ORDER — DOXYCYCLINE HYCLATE 100 MG TABLET
ORAL_TABLET | Freq: Two times a day (BID) | ORAL | 0 refills | 10 days | Status: CP
Start: 2022-09-23 — End: 2022-10-03

## 2022-09-23 NOTE — Unmapped (Signed)
Patient ID: Jenna Mosley is a 31 y.o. female who presents for new concerns of sore throat and fever.    Informant: Patient is accompanied by her mother.    Assessment/Plan:      Sore throat  Symptoms most consistent with sinus infection vs. throat infection, although, differential is wide and given low immunity and current symptoms, possible Influenza or Covid should be tested for as well. POCT Rapid Group A Strep test,Group A Strep Culture, and Rapid Influenza/RSV/Covid PCR,  ordered today.   -- Anti-infection: will provide a wait-and-see prescription for doxycycline 100 mg twice daily x 10 days for sinus infection if strep test comes back negative in office and from the lab. If lab results turn out to be positive, we will switch courses. (risks/benefits discussed).  -- Congestion: aggressive hydration and nasal saline rinse.  -- Fever/aches: use Tylenol or ibuprofen per bottle instructions as needed for comfort.  -- Rest: modify schedule as needed to allow time for recovery  -- Follow up if not improving in 5-7 days.  -- Standard guidance offered for symptom monitoring and home isolation until testing results return.    Preventive services addressed today  We did not review preventive services today    No follow-ups on file.       Subjective:      HPI  Sore throat/fever:   Patient has been experiencing a sore throat, fevers, fatigue, chills, hot flashes, muscle aches, rhinorrhea, facial sinus pressure and postnasal drip since Sunday. She received her Influenza vaccine on 08/26/22 and was last seen by Dr. Chanetta Marshall on 09/18/22 for shortness of breath. Patient states she woke up one day with a sore throat which has not been improving. For relief, she has tried hydrating, drinking both warm and cold liquids, and has tried taking both tylenol and ibuprofen, all without any relief. Her highest fever was 101 yesterday afternoon. She notes feeling grimmy and fatigued for 3 days. Reports increased facial pressure while leaning forward, so she feels that she has a sinus infection. Patient takes Zyrtec daily. 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 Her mother looked at her throat before the appointment and noted white stripes which looked like strep. Patient has leukemia and diabetes and is worried since she has a lower immunity. For leukemia treatment, she is currently taking bosutinib 500 mg once daily. Patient believes she is unable to take Cipro, Bactrim or Keflex since she has gained resistance to them after being treated for chronic UTIs. She also notes Augmetin, which was was given for a toe ulcer, causes diarrhea and nausea. Patient was taking clindamycin after she had a tooth pulled, but is no longer taking it since she did not tolerate it well.       Objective:      Vital Signs  BP 128/70  - Pulse 67  - Temp 36.8 ??C (98.2 ??F) (Oral)  - Resp 18  - Wt (!) 170.3 kg (375 lb 8 oz)  - SpO2 97%  - BMI 50.92 kg/m??      Exam  General: Young woman sitting comfortably in office chair.  EYES:  No scleral injection or discharge.  ENT: External auditory canals and tympanic membranes clear on the left. Partially obstructive cerumen impaction of the right tympanic membrane and external auditory canal. Frontal and maxillary sinus tenderness noted. Oropharynx moist with white lesions/hazing present.  No pharyngeal  exudates.    NECK: Normal size and contour. Thyroid smooth and symmetric without focal nodules.   RESP: Relaxed respiratory effort. Clear to auscultation without wheezes or crackles.   LYMPH: No enlarged cervical or supraclavicular nodes palpated.   CV: Regular rate and rhythm. Normal S1 and S2. No murmurs or gallops.  No lower extremity edema. Posterior tibial pulses are 2+ and symmetric.   NEURO: Stable gait and coordination.    I attest that I, Cherlyn Roberts, personally documented this note while acting as scribe for Vella Kohler, MD.      Cherlyn Roberts, Scribe.  09/23/2022     The documentation recorded by the scribe accurately reflects the service I personally performed and the decisions made by me.     Donell Sievert, MD  September 26, 2022 5:10 PM

## 2022-09-23 NOTE — Unmapped (Addendum)
Symptoms most consistent with sinus infection vs. throat infection, although, differential is wide and given low immunity and current symptoms, possible Influenza or Covid should be tested for as well. POCT Rapid Group A Strep test,Group A Strep Culture, and Rapid Influenza/RSV/Covid PCR,  ordered today.   -- Anti-infection: will provide a wait-and-see prescription for doxycycline 100 mg twice daily x 10 days for sinus infection if strep test comes back negative in office and from the lab. If lab results turn out to be positive, we will switch courses. (risks/benefits discussed).  -- Congestion: aggressive hydration and nasal saline rinse.  -- Fever/aches: use Tylenol or ibuprofen per bottle instructions as needed for comfort.  -- Rest: modify schedule as needed to allow time for recovery  -- Follow up if not improving in 5-7 days.  -- Standard guidance offered for symptom monitoring and home isolation until testing results return.

## 2022-09-24 MED ORDER — MOUNJARO 5 MG/0.5 ML SUBCUTANEOUS PEN INJECTOR
SUBCUTANEOUS | 0 refills | 0 days | Status: CP
Start: 2022-09-24 — End: 2022-10-16

## 2022-09-26 ENCOUNTER — Ambulatory Visit: Admit: 2022-09-26 | Discharge: 2022-09-27 | Payer: MEDICAID

## 2022-09-26 DIAGNOSIS — R062 Wheezing: Principal | ICD-10-CM

## 2022-09-26 MED ORDER — ALBUTEROL SULFATE HFA 90 MCG/ACTUATION AEROSOL INHALER
Freq: Four times a day (QID) | RESPIRATORY_TRACT | 0 refills | 0 days | Status: CP | PRN
Start: 2022-09-26 — End: 2023-09-26
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
## 2022-09-27 MED ORDER — MOUNJARO 2.5 MG/0.5 ML SUBCUTANEOUS PEN INJECTOR
0 refills | 0 days
Start: 2022-09-27 — End: ?

## 2022-09-27 NOTE — Unmapped (Signed)
Thanks for choosing [facility] at [employer]  for your medical care!    If you have any questions about your visit today, please call us at [phone].      For medication refills, please have your pharmacist send an electronic refill request    If you need care after 5:00 pm during the week or on the weekend:  Call [facility] Connect at [phone] to speak with a registered nurse about a medical concern or question, or...    Go to [facility] walk-in clinic [address], [address], [place] [phone] -- Open 7 days a week from 9:00AM - 8:00PM        We will always try to notify you of the results from laboratory tests within ten days of the study.  If you do not hear from [place] by phone, letter, or electronic message, please call the office immediately for further information.         Before your next visit:    --  Keep a list of questions that you would like to discuss when you return.     Goals        Reduce sugar intake to X grams per day           Things to think about to help me reach my goal:     What are you going to do? Reduce sugar intake   How and how much? No more sweets    How frequent? everyday   Barriers to success? Emotions; period; friends   Solutions to barriers? Choose alternatives with reduced sugar content; self control

## 2022-09-29 ENCOUNTER — Ambulatory Visit: Admit: 2022-09-29 | Discharge: 2022-09-30 | Payer: MEDICAID

## 2022-09-29 DIAGNOSIS — C9210 Chronic myeloid leukemia, BCR/ABL-positive, not having achieved remission: Principal | ICD-10-CM

## 2022-09-29 DIAGNOSIS — M5136 Other intervertebral disc degeneration, lumbar region: Principal | ICD-10-CM

## 2022-09-29 DIAGNOSIS — M545 Chronic midline low back pain without sciatica: Principal | ICD-10-CM

## 2022-09-29 DIAGNOSIS — G8929 Other chronic pain: Principal | ICD-10-CM

## 2022-09-29 DIAGNOSIS — M5459 Discogenic lumbar pain: Principal | ICD-10-CM

## 2022-09-29 DIAGNOSIS — M7918 Myalgia, other site: Principal | ICD-10-CM

## 2022-09-29 MED ORDER — CYCLOBENZAPRINE 5 MG TABLET
ORAL_TABLET | Freq: Three times a day (TID) | ORAL | 1 refills | 30 days | Status: CP | PRN
Start: 2022-09-29 — End: ?

## 2022-09-29 MED ORDER — MOUNJARO 2.5 MG/0.5 ML SUBCUTANEOUS PEN INJECTOR
0 refills | 0 days | Status: CP
Start: 2022-09-29 — End: ?
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
## 2022-09-30 ENCOUNTER — Ambulatory Visit: Admit: 2022-09-30 | Discharge: 2022-10-01 | Payer: MEDICAID

## 2022-09-30 MED ADMIN — gadoterate meglumine (DOTAREM) Soln 20 mL: 20 mL | INTRAVENOUS | @ 20:00:00 | Stop: 2022-09-30

## 2022-10-01 ENCOUNTER — Ambulatory Visit: Payer: No Typology Code available for payment source | Admitting: Psychiatry

## 2022-10-02 ENCOUNTER — Other Ambulatory Visit: Admit: 2022-10-02 | Discharge: 2022-10-03 | Payer: MEDICAID

## 2022-10-02 ENCOUNTER — Ambulatory Visit: Admit: 2022-10-02 | Discharge: 2022-10-03 | Payer: MEDICAID

## 2022-10-02 DIAGNOSIS — C9210 Chronic myeloid leukemia, BCR/ABL-positive, not having achieved remission: Principal | ICD-10-CM

## 2022-10-02 LAB — COMPREHENSIVE METABOLIC PANEL
ALBUMIN: 3.6 g/dL (ref 3.4–5.0)
ALKALINE PHOSPHATASE: 149 U/L — ABNORMAL HIGH (ref 46–116)
ALT (SGPT): 16 U/L (ref 10–49)
ANION GAP: 8 mmol/L (ref 5–14)
AST (SGOT): 26 U/L (ref <=34)
BILIRUBIN TOTAL: 0.3 mg/dL (ref 0.3–1.2)
BLOOD UREA NITROGEN: 15 mg/dL (ref 9–23)
BUN / CREAT RATIO: 14
CALCIUM: 9.2 mg/dL (ref 8.7–10.4)
CHLORIDE: 106 mmol/L (ref 98–107)
CO2: 24 mmol/L (ref 20.0–31.0)
CREATININE: 1.04 mg/dL — ABNORMAL HIGH
EGFR CKD-EPI (2021) FEMALE: 73 mL/min/{1.73_m2} (ref >=60)
GLUCOSE RANDOM: 124 mg/dL (ref 70–179)
POTASSIUM: 3.9 mmol/L (ref 3.4–4.8)
PROTEIN TOTAL: 7.5 g/dL (ref 5.7–8.2)
SODIUM: 138 mmol/L (ref 135–145)

## 2022-10-02 LAB — CBC W/ AUTO DIFF
BASOPHILS ABSOLUTE COUNT: 0.1 10*9/L (ref 0.0–0.1)
BASOPHILS RELATIVE PERCENT: 0.9 %
EOSINOPHILS ABSOLUTE COUNT: 0.2 10*9/L (ref 0.0–0.5)
EOSINOPHILS RELATIVE PERCENT: 1.7 %
HEMATOCRIT: 35.2 % (ref 34.0–44.0)
HEMOGLOBIN: 11.8 g/dL (ref 11.3–14.9)
LYMPHOCYTES ABSOLUTE COUNT: 3.8 10*9/L — ABNORMAL HIGH (ref 1.1–3.6)
LYMPHOCYTES RELATIVE PERCENT: 35 %
MEAN CORPUSCULAR HEMOGLOBIN CONC: 33.6 g/dL (ref 32.0–36.0)
MEAN CORPUSCULAR HEMOGLOBIN: 27.9 pg (ref 25.9–32.4)
MEAN CORPUSCULAR VOLUME: 83.1 fL (ref 77.6–95.7)
MEAN PLATELET VOLUME: 8.4 fL (ref 6.8–10.7)
MONOCYTES ABSOLUTE COUNT: 0.4 10*9/L (ref 0.3–0.8)
MONOCYTES RELATIVE PERCENT: 4 %
NEUTROPHILS ABSOLUTE COUNT: 6.4 10*9/L (ref 1.8–7.8)
NEUTROPHILS RELATIVE PERCENT: 58.4 %
PLATELET COUNT: 283 10*9/L (ref 150–450)
RED BLOOD CELL COUNT: 4.23 10*12/L (ref 3.95–5.13)
RED CELL DISTRIBUTION WIDTH: 14.5 % (ref 12.2–15.2)
WBC ADJUSTED: 10.9 10*9/L (ref 3.6–11.2)
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
## 2022-10-02 NOTE — Unmapped (Signed)
[facility] Refill Coordination Note    Specialty Medication(s) to be Shipped:   Hematology/Oncology: Bosulif    Other medication(s) to be shipped: No additional medications requested for fill at this time     Jenna Mosley, DOB: 1991/06/28  Phone: [phone] (home)       All above HIPAA information was verified with patient.     Was a Nurse, learning disability used for this call? No    Completed refill call assessment today to schedule patient's medication shipment from the [facility] [phone]).  All relevant notes have been reviewed.     Specialty medication(s) and dose(s) confirmed: Regimen is correct and unchanged.   Changes to medications: Emerita reports starting the following medications: doxycycline,symbicort  Changes to insurance: No  New side effects reported not previously addressed with a pharmacist or physician: None reported  Questions for the pharmacist: No    Confirmed patient received a Conservation officer, historic buildings and a Surveyor, mining with first shipment. The patient will receive a drug information handout for each medication shipped and additional FDA Medication Guides as required.       DISEASE/MEDICATION-SPECIFIC INFORMATION        N/A    SPECIALTY MEDICATION ADHERENCE     Medication Adherence    Patient reported X missed doses in the last month: 2  Specialty Medication: Bosulif 500 mg  Patient is on additional specialty medications: No  Patient is on more than two specialty medications: No  Any gaps in refill history greater than 2 weeks in the last 3 months: no  Demonstrates understanding of importance of adherence: yes  Informant: patient  Reliability of informant: reliable  Provider-estimated medication adherence level: good  Patient is at risk for Non-Adherence: No  Reasons for non-adherence: patient forgets                  Confirmed plan for next specialty medication refill: delivery by pharmacy  Refills needed for supportive medications: not needed Refill Coordination    Has the Patients' Contact Information Changed: No  Is the Shipping Address Different: No         Were doses missed due to medication being on hold? No    bosulif 500 mg: 14 days of medicine on hand       REFERRAL TO PHARMACIST     Referral to the pharmacist: Not needed      [facility]     Shipping address confirmed in Epic.     Delivery Scheduled: Yes, Expected medication delivery date: 11/22.     Medication will be delivered via Next Day Courier to the prescription address in Epic [place].    Antonietta Barcelona   [facility] Specialty Technician

## 2022-10-03 NOTE — Unmapped (Signed)
Your labs look great today.     I will send you a message with the results of your bcr/abl once it comes back.     I want to see you back in 3 months.     Lab on 10/02/2022   Component Date Value Ref Range Status    Sodium 10/02/2022 138  135 - 145 mmol/L Final    Potassium 10/02/2022 3.9  3.4 - 4.8 mmol/L Final    Chloride 10/02/2022 106  98 - 107 mmol/L Final    CO2 10/02/2022 24.0  20.0 - 31.0 mmol/L Final    Anion Gap 10/02/2022 8  5 - 14 mmol/L Final    BUN 10/02/2022 15  9 - 23 mg/dL Final    Creatinine 91/47/8295 1.04 (H)  0.55 - 1.02 mg/dL Final    BUN/Creatinine Ratio 10/02/2022 14   Final    eGFR CKD-EPI (2021) Female 10/02/2022 73  >=60 mL/min/1.95m2 Final    eGFR calculated with CKD-EPI 2021 equation in accordance with [employer] and [employer] of Nephrology Task Force recommendations.    Glucose 10/02/2022 124  70 - 179 mg/dL Final    Calcium 62/13/0865 9.2  8.7 - 10.4 mg/dL Final    Albumin 78/46/9629 3.6  3.4 - 5.0 g/dL Final    Total Protein 10/02/2022 7.5  5.7 - 8.2 g/dL Final    Total Bilirubin 10/02/2022 0.3  0.3 - 1.2 mg/dL Final    AST 52/84/1324 26  <=34 U/L Final    ALT 10/02/2022 16  10 - 49 U/L Final    Alkaline Phosphatase 10/02/2022 149 (H)  46 - 116 U/L Final    WBC 10/02/2022 10.9  3.6 - 11.2 10*9/L Final    RBC 10/02/2022 4.23  3.95 - 5.13 10*12/L Final    HGB 10/02/2022 11.8  11.3 - 14.9 g/dL Final    HCT 40/08/2724 35.2  34.0 - 44.0 % Final    MCV 10/02/2022 83.1  77.6 - 95.7 fL Final    MCH 10/02/2022 27.9  25.9 - 32.4 pg Final    MCHC 10/02/2022 33.6  32.0 - 36.0 g/dL Final    RDW 36/64/4034 14.5  12.2 - 15.2 % Final    MPV 10/02/2022 8.4  6.8 - 10.7 fL Final    Platelet 10/02/2022 283  150 - 450 10*9/L Final    Neutrophils % 10/02/2022 58.4  % Final    Lymphocytes % 10/02/2022 35.0  % Final    Monocytes % 10/02/2022 4.0  % Final    Eosinophils % 10/02/2022 1.7  % Final    Basophils % 10/02/2022 0.9  % Final    Absolute Neutrophils 10/02/2022 6.4  1.8 - 7.8 10*9/L Final    Absolute Lymphocytes 10/02/2022 3.8 (H)  1.1 - 3.6 10*9/L Final    Absolute Monocytes 10/02/2022 0.4  0.3 - 0.8 10*9/L Final    Absolute Eosinophils 10/02/2022 0.2  0.0 - 0.5 10*9/L Final    Absolute Basophils 10/02/2022 0.1  0.0 - 0.1 10*9/L Final

## 2022-10-07 MED ORDER — MOUNJARO 2.5 MG/0.5 ML SUBCUTANEOUS PEN INJECTOR
0 refills | 0 days
Start: 2022-10-07 — End: ?

## 2022-10-07 MED FILL — BOSULIF 500 MG TABLET: ORAL | 30 days supply | Qty: 30 | Fill #2

## 2022-10-07 NOTE — Unmapped (Unsigned)
Requested Prescriptions     Pending Prescriptions Disp Refills    tirzepatide (MOUNJARO) 2.5 mg/0.5 mL PnIj 2 mL 0

## 2022-10-22 ENCOUNTER — Institutional Professional Consult (permissible substitution): Admit: 2022-10-22 | Discharge: 2022-10-23 | Payer: MEDICAID

## 2022-10-22 ENCOUNTER — Ambulatory Visit: Admit: 2022-10-22 | Discharge: 2022-10-23 | Payer: MEDICAID | Attending: Internal Medicine | Primary: Internal Medicine

## 2022-10-22 ENCOUNTER — Ambulatory Visit: Admit: 2022-10-22 | Discharge: 2022-10-23 | Payer: MEDICAID

## 2022-10-22 DIAGNOSIS — C9210 Chronic myeloid leukemia, BCR/ABL-positive, not having achieved remission: Principal | ICD-10-CM

## 2022-10-22 MED ORDER — MOUNJARO 7.5 MG/0.5 ML SUBCUTANEOUS PEN INJECTOR
SUBCUTANEOUS | 0 refills | 0 days | Status: CP
Start: 2022-10-22 — End: 2022-11-13

## 2022-10-22 MED ADMIN — leuprolide (LUPRON) injection 11.25 mg: 11.25 mg | INTRAMUSCULAR | @ 17:00:00 | Stop: 2022-10-22

## 2022-10-22 NOTE — Unmapped (Signed)
Injection site cleansing with alcohol and allowed to dry. Patient received Lupron injection without complications while in clinic. Area covered with gauze and bandaid.

## 2022-10-25 MED ORDER — DEXCOM G6 SENSOR DEVICE
11 refills | 0 days
Start: 2022-10-25 — End: ?

## 2022-10-27 MED ORDER — DEXCOM G6 SENSOR DEVICE
11 refills | 0 days | Status: CP
Start: 2022-10-27 — End: ?

## 2022-10-28 NOTE — Unmapped (Signed)
[facility] Refill Coordination Note    Specialty Medication(s) to be Shipped:   Hematology/Oncology: Bosulif    Other medication(s) to be shipped: No additional medications requested for fill at this time     Jenna Mosley, DOB: 03-29-1991  Phone: [phone] (home)       All above HIPAA information was verified with patient.     Was a Nurse, learning disability used for this call? No    Completed refill call assessment today to schedule patient's medication shipment from the [facility] [phone]).  All relevant notes have been reviewed.     Specialty medication(s) and dose(s) confirmed: Regimen is correct and unchanged.   Changes to medications: [name] reports no changes at this time.  Changes to insurance: No  New side effects reported not previously addressed with a pharmacist or physician: None reported  Questions for the pharmacist: No    Confirmed patient received a Conservation officer, historic buildings and a Surveyor, mining with first shipment. The patient will receive a drug information handout for each medication shipped and additional FDA Medication Guides as required.       DISEASE/MEDICATION-SPECIFIC INFORMATION        N/A    SPECIALTY MEDICATION ADHERENCE     Medication Adherence    Patient reported X missed doses in the last month: 2  Specialty Medication: Bosulif 500 mg  Patient is on additional specialty medications: No  Informant: patient                       Were doses missed due to medication being on hold? No    Bosulif 500mg : 14 days of medicine on hand       REFERRAL TO PHARMACIST     Referral to the pharmacist: Not needed      [facility]     Shipping address confirmed in Epic.     Delivery Scheduled: Yes, Expected medication delivery date: 11/07/22.     Medication will be delivered via Next Day Courier to the prescription address in Epic [place].    Jenna Mosley   [facility] Specialty Technician

## 2022-11-06 MED FILL — BOSULIF 500 MG TABLET: ORAL | 30 days supply | Qty: 30 | Fill #3

## 2022-11-13 NOTE — Unmapped (Signed)
Medication question °

## 2022-12-03 ENCOUNTER — Ambulatory Visit
Admit: 2022-12-03 | Discharge: 2022-12-04 | Payer: MEDICAID | Attending: Geriatric Medicine | Primary: Geriatric Medicine

## 2022-12-03 LAB — HEMOGLOBIN A1C
ESTIMATED AVERAGE GLUCOSE: 189 mg/dL
HEMOGLOBIN A1C: 8.2 % — ABNORMAL HIGH (ref 4.8–5.6)

## 2022-12-03 LAB — CBC
HEMATOCRIT: 38.2 % (ref 34.0–44.0)
HEMOGLOBIN: 12.7 g/dL (ref 11.3–14.9)
MEAN CORPUSCULAR HEMOGLOBIN CONC: 33.2 g/dL (ref 32.0–36.0)
MEAN CORPUSCULAR HEMOGLOBIN: 27.3 pg (ref 25.9–32.4)
MEAN CORPUSCULAR VOLUME: 82.3 fL (ref 77.6–95.7)
MEAN PLATELET VOLUME: 9.5 fL (ref 6.8–10.7)
PLATELET COUNT: 263 10*9/L (ref 150–450)
RED BLOOD CELL COUNT: 4.65 10*12/L (ref 3.95–5.13)
RED CELL DISTRIBUTION WIDTH: 14.5 % (ref 12.2–15.2)
WBC ADJUSTED: 9.4 10*9/L (ref 3.6–11.2)

## 2022-12-03 LAB — FERRITIN: FERRITIN: 71.4 ng/mL

## 2022-12-03 LAB — IRON & TIBC
IRON SATURATION: 11 % — ABNORMAL LOW (ref 20–55)
IRON: 50 ug/dL
TOTAL IRON BINDING CAPACITY: 468 ug/dL — ABNORMAL HIGH (ref 250–425)

## 2022-12-03 LAB — COMPREHENSIVE METABOLIC PANEL
ALBUMIN: 3.8 g/dL (ref 3.4–5.0)
ALKALINE PHOSPHATASE: 159 U/L — ABNORMAL HIGH (ref 46–116)
ALT (SGPT): 12 U/L (ref 10–49)
ANION GAP: 10 mmol/L (ref 5–14)
AST (SGOT): 22 U/L (ref <=34)
BILIRUBIN TOTAL: 0.3 mg/dL (ref 0.3–1.2)
BLOOD UREA NITROGEN: 22 mg/dL (ref 9–23)
BUN / CREAT RATIO: 18
CALCIUM: 10.4 mg/dL (ref 8.7–10.4)
CHLORIDE: 106 mmol/L (ref 98–107)
CO2: 21 mmol/L (ref 20.0–31.0)
CREATININE: 1.23 mg/dL — ABNORMAL HIGH
EGFR CKD-EPI (2021) FEMALE: 60 mL/min/{1.73_m2} (ref >=60)
GLUCOSE RANDOM: 156 mg/dL (ref 70–179)
POTASSIUM: 5.1 mmol/L (ref 3.5–5.1)
PROTEIN TOTAL: 7.9 g/dL (ref 5.7–8.2)
SODIUM: 137 mmol/L (ref 135–145)

## 2022-12-03 LAB — VITAMIN B12: VITAMIN B-12: 425 pg/mL (ref 211–911)

## 2022-12-03 LAB — FOLATE: FOLATE: 21.6 ng/mL (ref >=5.4)

## 2022-12-03 LAB — MAGNESIUM: MAGNESIUM: 1.9 mg/dL (ref 1.6–2.6)

## 2022-12-03 NOTE — Unmapped (Signed)
We have entered a referral for you today.  Please call the [facility] Radiology office at [phone] to schedule a time for this appointment.

## 2022-12-03 NOTE — Unmapped (Signed)
Patient ID: Jenna Mosley is a 32 y.o. female who presents for follow up of multiple medical problems including headache.  Informant: Patient is accompanied by mom.  Assessment/Plan:   Type 2 diabetes mellitus with diabetic neuropathy, with long-term current use of insulin (CMS-HCC)  Assessment & Plan:  Management as per endocrine.  Will check labs today.  Patient is due for an eye exam.    Orders:  -     Hemoglobin A1c    Essential (primary) hypertension  Assessment & Plan:  Bp under good control taking valsartan 160mg  daily, atenolol 100mg  daily, hydrochlorothiazide 25mg  daily, and furosemide 40 mg every other day.      Orders:  -     CBC    Primary stabbing headache  Assessment & Plan:  Possible that cerumen impaction was causing symptoms however given patient history of cancer will need to get an MRI to further evaluate new onset headache.  In the meantime can use cold packs when experiencing headache.  Is unable to use NSAIDs.  Can use Tylenol.  Consider referral to neurology if symptoms persist.    Orders:  -     Vitamin B12 Level  -     Folate Level  -     Ferritin  -     Iron & TIBC  -     Comprehensive Metabolic Panel  -     MRI Brain W Wo Contrast; Future  -     Magnesium Level    Impacted cerumen of right ear  Assessment & Plan:  Right ear lavage with successful dislodgment of lima bean sized ball of wax from ear.  TM visualized and normal.  Patient expressed dramatic improvement in her right-sided head pain.  Recommend she use Debrox nightly 7 days a week every month.    Orders:  -     Ear wax removal    Class 3 severe obesity with body mass index (BMI) of 45.0 to 49.9 in adult, unspecified obesity type, unspecified whether serious comorbidity present (CMS-HCC)  Assessment & Plan:  Weight gain has been dramatic over the past few months.  Possible that some of this is fluid retention however likely it is weight gain.  Discussed possibly adding in naltrexone to decrease cravings.   Continue to sciatica  Assessment & Plan:  Can try heat alternating with ice.  Lidocaine patch, biofreeze or aspercreme.  Possibly related to weight gain.  Evidence of degenerative changes on x-ray.  Refer to spine clinic.       Orders:  -     Ambulatory referral to [facility]; Future     Class 3 severe obesity with body mass index (BMI) of 45.0 to 49.9 in adult, unspecified obesity type, unspecified whether serious comorbidity present (CMS-HCC)  Assessment & Plan:  Weight gain has been dramatic over the past few months.  Possible that some of this is fluid retention however likely it is weight gain.  Discussed possibly adding in naltrexone to decrease cravings.  Patient recently started on with Dara which hopefully should help.  Continue to monitor.           Other orders  -     furosemide; Take 1 tablet (40 mg total) by mouth daily.  -     budesonide-formoteroL; Inhale 1 puff two (2) times a day.  -     lidocaine; Place 1 patch on the skin daily. Apply to affected area for 12 hours only each day (then remove  patch)  -     diclofenac sodium; Apply 4 g topically four (4) times a day. cancer. She used to have bad vitamin deficiencies and used to get B12 injections. She used to take magnesium, but they stopped her magnesium when she got cancer. The headache comes and goes, but almost every night she has a really bad headache and sometimes a nauseated headache and she will end up going to bed early taking 3 Tylenol. Last night she went to the gym to exercise and she made it for 5 minutes and she had a horrible headache and she had to leave. Some days she was pale and did not feel good. She has had migraines in the past, but this is different. The surges of pain feel like a shock. She rates the pain as 10 out of 10. She has been having pain in the middle of the night while sleeping and they wake her up. Before the headache started, she had been overly fatigued and sleeping too much. She denies any numbness or weakness in her arms or legs, trouble speaking or putting together words, or face droop. She did have one night when her headache was really bad and she kept telling her that she was a little confused.    She would like to have her ears checked because she often gets her ears impacted. She does not use Q-tips because she does not want to pack it in her ears. If she lays on her side in the middle of the night, her ears will clog up. She has not put anything in her ears except her finger. She has had irrigations in her ears since she was a teenager. 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    Her cancer treatment is the same. Her blood sugar regimen was changed. She just got her Dexcom approved. She is reluctant to go to physical therapy because Medicaid only pays for 5 visits. She has a lot of trouble with her neck. She is not on thyroid medication.    She feels like she has gained weight. Her psychiatrist told her that she gained 20 pounds with their scales. She has not eaten much. She has always been a heavy child. She is on Mounjaro 2.5 mg once a week.      ROS  A comprehensive review of systems was conducted with negative results except as noted in the HPI above.          Objective:   Vital Signs  BP 124/62 (BP Site: L Arm, BP Position: Sitting, BP Cuff Size: Large)  - Pulse 64  - Temp 36.9 ??C (98.5 ??F) (Oral)  - Ht 182.9 cm (6' 0.01)  - Wt (!) 170.1 kg (375 lb)  - SpO2 96%  - Breastfeeding No  - BMI 50.85 kg/m??  Body mass index is 50.85 kg/m??.  Exam  Physical Exam  Constitutional:       Appearance: Normal appearance.   HENT:      Head: Normocephalic and atraumatic.      Right Ear: There is impacted cerumen.      Left Ear: Tympanic membrane normal.      Ears:      Comments: Ceruminosis is noted.  Wax is removed by syringing and manual debridement. Instructions for home care to prevent wax buildup are given.    Cardiovascular:      Rate and Rhythm: Normal rate and regular rhythm.   Skin:  General: Skin is warm.   Neurological:      General: No focal deficit present.      Mental Status: She is alert. Mental status is at baseline.   Psychiatric:         Mood and Affect: Mood normal.         Behavior: Behavior normal.         Thought Content: Thought content normal.             I've personally reviewed and summarized records in EPIC/Media and via CareEverywhere as well as medication, allergies, past medical, social, and family history.   I have reviewed the patient's medical history in detail and updated the computerized patient record.    Note - This chart has been prepared using the Dragon voice recognition system. Typographical errors may have occurred.  Attempts have been made to correct errors, however, inadvertent errors may persist.

## 2022-12-04 NOTE — Unmapped (Signed)
MRI showed no evidence of metastatic disease and no significant spinal canal or foraminal narrowing in the lumbar space recommendation was for physical therapy however patient says she cannot afford physical therapy at current time.Can try heat alternating with ice.  Lidocaine patch, biofreeze or aspercreme

## 2022-12-04 NOTE — Unmapped (Signed)
Possible that cerumen impaction was causing symptoms however given patient history of cancer will need to get an MRI to further evaluate new onset headache.  In the meantime can use cold packs when experiencing headache.  Is unable to use NSAIDs.  Can use Tylenol.  Consider referral to neurology if symptoms persist.

## 2022-12-04 NOTE — Unmapped (Signed)
Management as per endocrine.  Will check labs today.  Patient is due for an eye exam.

## 2022-12-04 NOTE — Unmapped (Signed)
Right ear lavage with successful dislodgment of lima bean sized ball of wax from ear.  TM visualized and normal.  Patient expressed dramatic improvement in her right-sided head pain.  Recommend she use Debrox nightly 7 days a week every month.

## 2022-12-04 NOTE — Unmapped (Signed)
Bp under good control taking valsartan 160mg  daily, atenolol 100mg  daily, hydrochlorothiazide 25mg  daily, and furosemide 40 mg every other day.

## 2022-12-04 NOTE — Unmapped (Signed)
Weight gain has been dramatic over the past few months.  Possible that some of this is fluid retention however likely it is weight gain.  Discussed possibly adding in naltrexone to decrease cravings.   Continue to monitor.  Patient will have evaluation by weight management in February.  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  Await recommendations.

## 2022-12-05 NOTE — Unmapped (Signed)
Diabetes under much better control.  Kidney function is slightly worse - we need to recheck this when you come back to clinic.  Please make sure you are drinking plenty of fluids.  Tight control of your blood sugars will also help your kidneys.  Blood counts are normal.  Iron studies show mixed picture and are likely abnormal due to chronic illness (weight, cancer, diabetes).  We should recheck this at your next visit as well.  B12, folic acid, liver function, magnesium all normal.    Willia Craze, MD MPH  [facility]  [facility]  [address], [place], [place] [address]  p [phone]

## 2022-12-15 ENCOUNTER — Ambulatory Visit: Admit: 2022-12-15 | Discharge: 2022-12-15 | Payer: MEDICAID

## 2022-12-15 DIAGNOSIS — R0609 Other forms of dyspnea: Principal | ICD-10-CM

## 2022-12-15 LAB — COMPREHENSIVE METABOLIC PANEL
ALBUMIN: 3.7 g/dL (ref 3.4–5.0)
ALKALINE PHOSPHATASE: 160 U/L — ABNORMAL HIGH (ref 46–116)
ALT (SGPT): 12 U/L (ref 10–49)
ANION GAP: 8 mmol/L (ref 5–14)
AST (SGOT): 13 U/L (ref <=34)
BILIRUBIN TOTAL: 0.3 mg/dL (ref 0.3–1.2)
BLOOD UREA NITROGEN: 24 mg/dL — ABNORMAL HIGH (ref 9–23)
BUN / CREAT RATIO: 20
CALCIUM: 9.3 mg/dL (ref 8.7–10.4)
CHLORIDE: 108 mmol/L — ABNORMAL HIGH (ref 98–107)
CO2: 22.5 mmol/L (ref 20.0–31.0)
CREATININE: 1.23 mg/dL — ABNORMAL HIGH
EGFR CKD-EPI (2021) FEMALE: 60 mL/min/{1.73_m2} (ref >=60)
GLUCOSE RANDOM: 310 mg/dL — ABNORMAL HIGH (ref 70–179)
POTASSIUM: 5 mmol/L — ABNORMAL HIGH (ref 3.4–4.8)
PROTEIN TOTAL: 7.2 g/dL (ref 5.7–8.2)
SODIUM: 138 mmol/L (ref 135–145)

## 2022-12-15 MED ORDER — FUROSEMIDE 20 MG TABLET
ORAL_TABLET | Freq: Every day | ORAL | 3 refills | 90 days | Status: CP
Start: 2022-12-15 — End: 2023-12-15

## 2022-12-15 NOTE — Unmapped (Addendum)
Please take your medications as prescribed.      Thank you for allowing me to be a part of your care. Please call the clinic with any questions.     Between appointments, you can reach us at these numbers:    For Appointments: [phone]     For urgent issues after hours: Call the hospital operator ([phone]) and ask for the "Pulmonary Fellow On Call"    Clinic Fax: Fax: [phone]    Some information about contacting us through My [place] Chart:    My [place] Chart is for non-urgent messages. This means you have a simple medical question that does not require an immediate response.    If you need immediate attention, call 911. If you have an non-emergent but urgent issue that you feel needs a response the same day, you should contact your primary care provider or be evaluated at an Urgent Care clinic.    Responses may take up to 3 business days. Your message will be read by your provider or another medical team member who may respond on your provider's behalf. We will do our best to respond as quickly as possible, as your message is important to us. However, many of our providers have other duties (inpatient hospital work, research activities, teaching) that may make them not available for same day responses. If you have sent a MyChart message to the clinic and have not received a response after three business days, please call us at [phone] to speak to a nurse.    Some questions cannot be answered through messages in My [facility] Chart. Depending on your question, your provider's office may ask you to schedule an appointment.     Information sent through My [facility] Chart will become part of your medical record.    Thank you for your visit to the [facility].  You may receive a survey from [facility] regarding your visit today, and we are eager to use this feedback to improve your experience.  Thank you for taking the time to fill it out.
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
## 2022-12-16 LAB — PRO-BNP: PRO-BNP: 230 pg/mL — ABNORMAL HIGH (ref 0.0–125.0)

## 2022-12-16 NOTE — Unmapped (Signed)
[facility] Clinical Assessment & Refill Coordination Note    Jenna Mosley, DOB: 12-22-1990  Phone: [phone] (home)     All above HIPAA information was verified with patient.     Was a Nurse, learning disability used for this call? No    Specialty Medication(s):   Hematology/Oncology: Bosulif     Current Outpatient Medications   Medication Sig Dispense Refill    acetaminophen (TYLENOL) 500 MG tablet Take 2 tablets (1,000 mg total) by mouth every six (6) hours as needed for pain. Extra strength tylenol      albuterol HFA 90 mcg/actuation inhaler Inhale 2 puffs every six (6) hours as needed for wheezing. 6.7 g 0    atenoloL (TENORMIN) 100 MG tablet TAKE 1 TABLET BY MOUTH IN THE MORNING. 90 tablet 3    blood sugar diagnostic (ACCU-CHEK GUIDE TEST STRIPS) Strp Check sugars before meals three times for insulin dependent type two diabetes. 100 each 11    blood-glucose meter kit Use as instructed - pt prefers a larger monitor glucometer is available 1 each 0    blood-glucose meter,continuous (DEXCOM G6 RECEIVER) Misc Dispense DexCom G6 Receiver 1 each 3    blood-glucose sensor (DEXCOM G6 SENSOR) Devi USE 1 SENSOR EVERY 10 DAYS AS DIRECTED 3 each 11    blood-glucose transmitter (DEXCOM G6 TRANSMITTER) Devi DexCom G6 transmitter; Use 1 every 90 days 1 each 3    bosutinib 500 mg Tab Take 1 tablet (500 mg total) by mouth daily. 30 tablet 5    budesonide-formoteroL (SYMBICORT) 80-4.5 mcg/actuation inhaler Inhale 1 puff two (2) times a day. (Patient not taking: Reported on 12/16/2022) 6 g 5    calcium carbonate-vitamin D3 600 mg-20 mcg (800 unit) Tab Take 1 mg by mouth Two (2) times a day (at 8am and 12:00).      cetirizine (ZYRTEC) 10 MG tablet TAKE 1 TABLET BY MOUTH IN THE MORNING. 90 tablet 3    clonazePAM (KLONOPIN) 0.5 MG tablet Take 1 tablet (0.5 mg total) by mouth Three (3) times a day. PRN      cyclobenzaprine (FLEXERIL) 5 MG tablet Take 1 tablet (5 mg total) by mouth Three (3) times a day as needed (lower back pain). 90 tablet 1    diclofenac sodium (VOLTAREN) 1 % gel Apply 4 g topically four (4) times a day. 300 g 1    divalproex ER (DEPAKOTE ER) 500 MG extended released 24 hr tablet TAKE 1 TABLET BY MOUTH AT BEDTIME 90 tablet 3    ferrous sulfate 325 (65 FE) MG tablet Take 1 tablet (325 mg total) by mouth in the morning.      furosemide (LASIX) 20 MG tablet Take 3 tablets (60 mg total) by mouth daily. 270 tablet 3    hydroCHLOROthiazide (HYDRODIURIL) 25 MG tablet TAKE 1 TABLET BY MOUTH IN THE MORNING. 90 tablet 3    hydrOXYzine (VISTARIL) 50 MG capsule TAKE 1 CAPSULE BY MOUTH AT BEDTIME AS NEEDED TO IMPROVE SLEEP 90 capsule 3    insulin regular hum U-500 conc (HUMULIN R U-500, CONC, KWIKPEN) 500 unit/mL (3 mL) CONCENTRATED injection Inject 130 Units under the skin Two (2) times a day (30 minutes before a meal). 46.8 mL 3    lamoTRIgine (LAMICTAL) 200 MG tablet TAKE 1 TABLET BY MOUTH TWICE DAILY 180 tablet 3    lancets (ACCU-CHEK SOFTCLIX LANCETS) Misc Check sugar three times per day before meals for insulin dependent type two diabetes.  E11.65 100 each 11  leuprolide acetate (LUPRON DEPOT IM) Inject into the muscle.      lidocaine (LIDODERM) 5 % patch Place 1 patch on the skin daily. Apply to affected area for 12 hours only each day (then remove patch) 30 patch 5    metFORMIN (GLUCOPHAGE) 1000 MG tablet Take 1 tablet (1,000 mg total) by mouth in the morning and 1 tablet (1,000 mg total) in the evening. Take with meals. 180 tablet 3    MOUNJARO 2.5 mg/0.5 mL PnIj INJECT . UNDER THE SKIN EVERY 7 DAYS FOR 4 DOSES 2 mL 0    norethindrone (AYGESTIN) 5 mg tablet TAKE ONE TABLET EVERY MORNING 30 tablet 11    pantoprazole (PROTONIX) 40 MG tablet TAKE 1 TABLET BY MOUTH IN THE MORNING. 90 tablet 3    pregabalin (LYRICA) 75 MG capsule TAKE 1 CAPSULE BY MOUTH IN THE MORNING AND 2 CAPSULES BY MOUTH IN THE EVENING. 270 capsule 0    traZODone (DESYREL) 100 MG tablet TAKE 2 TABLETS BY MOUTH AT BEDTIME 180 tablet 3    valsartan (DIOVAN) 160 MG tablet TAKE 1 TABLET BY MOUTH DAILY 90 tablet 3    ziprasidone (GEODON) 80 MG capsule Take 1 capsule (80 mg total) by mouth in the morning and 1 capsule (80 mg total) in the evening. Take with meals.       No current facility-administered medications for this visit.        Changes to medications: [name] reports no changes at this time.    Allergies   Allergen Reactions    Lisinopril Shortness Of Breath     Other Reaction(s): chest pain    SOB, chest painSOB, chest pain    Naproxen Nausea Only, Palpitations and Other (See Comments)     Chest palpitations and feels like flying    Aripiprazole Other (See Comments)     Inability to urinate    Other Reaction(s): cannot void    Fluphenazine      mental health problems    Lactase      Other reaction(s): Unknown    Other Reaction(s): Unknown    Lactose Other (See Comments)    Metoclopramide Other (See Comments)     Mania    ManiaMania    Prednisone Other (See Comments)     mania    Reglan [Metoclopramide Hcl] Other (See Comments)     Induces mania    Diphenhydramine Hcl Anxiety     Other Reaction(s): mania    Multihance [Gadobenate Dimeglumine] Nausea And Vomiting    Ondansetron Hcl Anxiety    Promethazine Anxiety       Changes to allergies: No    SPECIALTY MEDICATION ADHERENCE     Bosulif 500 mg: 9 days of medicine on hand     Are there any concerns with adherence? No    Adherence counseling provided? Not needed    Patient-Reported Symptoms Tracker for Cancer Patients on Oral Chemotherapy     Oral chemotherapy medication name(s): Bosulif  Dose and frequency: 400 mg once daily  Oral Chemotherapy Start Date:    Baseline?    Clinic(s) visited: Hematology    Symptom Grouping Question Patient Response   Digestion and Eating Have you felt sick to your stomach? Denies    Had diarrhea? Denies    Constipated? Denies    Not wanting to eat? Denies    Comments      Sleep and Pain Felt very tired even after you rest? Denies    Pain due to  cancer medication or cancer? Denies    Comments     Other Side Effects Numbness or tingling in hands and/or feet? Denies    Felt short of breath? Denies    Mouth or throat Sores? Denies    Rash? Denies    Palmar-plantar erythrodysesthesia syndrome?      Rash - acneiform?      Rash - maculo-papular?      How many days over the past month did your cancer medication or cancer keep you from your normal activities?  Write in number of days, 0-30:  0    Other side effects or things you would like to discuss?      Comments?     Adherence  In the last 30 days, on how many days did you miss at least one dose of any of your [drug name]? Write in number of days, 0-30:  4    What reasons are you having trouble taking your medication [pharmacist: check all that apply]? Specify chemotherapy cycle:        Patient forgets    Comments:        Comments       Optional Symptom Tracking Comments:      CLINICAL MANAGEMENT AND INTERVENTION      Clinical Benefit Assessment:    Do you feel the medicine is effective or helping your condition? Yes    Clinical Benefit counseling provided? Not needed    Acute Infection Status:    Acute infections noted within Epic:  No active infections    Patient reported infection: None    Therapy Appropriateness:    Is therapy appropriate and patient progressing towards therapeutic goals? Yes, therapy is appropriate and should be continued    DISEASE/MEDICATION-SPECIFIC INFORMATION      N/A    Is the patient receiving adequate infection prevention treatment? Not applicable    Does the patient have adequate nutritional support? Not applicable    PATIENT SPECIFIC NEEDS     Does the patient have any physical, cognitive, or cultural barriers? No    Is the patient high risk? No    Did the patient require a clinical intervention? No    Does the patient require physician intervention or other additional services (i.e., nutrition, smoking cessation, social work)? No    SOCIAL DETERMINANTS OF HEALTH     At the [facility], we have learned that life circumstances - like trouble affording food, housing, utilities, or transportation can affect the health of many of our patients.   That is why we wanted to ask: are you currently experiencing any life circumstances that are negatively impacting your health and/or quality of life? Patient declined to answer    Social Determinants of Health     Financial Resource Strain: Medium Risk (06/25/2022)    Overall Financial Resource Strain (CARDIA)     Difficulty of Paying Living Expenses: Somewhat hard   Internet Connectivity: Not on file   Food Insecurity: Food Insecurity Present (06/25/2022)    Hunger Vital Sign     Worried About Running Out of Food in the Last Year: Often true     Ran Out of Food in the Last Year: Sometimes true   Tobacco Use: Medium Risk (12/15/2022)    Patient History     Smoking Tobacco Use: Former     Smokeless Tobacco Use: Never     Passive Exposure: Not on file   Housing/Utilities: Low Risk  (06/25/2022)    Housing/Utilities  Within the past 12 months, have you ever stayed: outside, in a car, in a tent, in an overnight shelter, or temporarily in someone else's home (i.e. couch-surfing)?: No     Are you worried about losing your housing?: No     Within the past 12 months, have you been unable to get utilities (heat, electricity) when it was really needed?: No   Alcohol Use: Not At Risk (05/14/2021)    Alcohol Use     How often do you have a drink containing alcohol?: Never     How many drinks containing alcohol do you have on a typical day when you are drinking?: Not on file     How often do you have 5 or more drinks on one occasion?: Not on file   Transportation Needs: No Transportation Needs (06/25/2022)    PRAPARE - Transportation     Lack of Transportation (Medical): No     Lack of Transportation (Non-Medical): No   Substance Use: Not on file   Health Literacy: Medium Risk (05/14/2021)    Health Literacy     : Sometimes   Physical Activity: Not on file   Interpersonal Safety: Not on file   Stress: Not on file   Intimate Partner Violence: Not on file   Depression: Not at risk (04/10/2021)    PHQ-2     PHQ-2 Score: 2   Recent Concern: Depression - At risk (04/02/2021)    PHQ-2     PHQ-2 Score: 3   Social Connections: Not on file     Would you be willing to receive help with any of the needs that you have identified today? Not applicable       SHIPPING     Specialty Medication(s) to be Shipped:   Hematology/Oncology: Bosulif    Other medication(s) to be shipped: No additional medications requested for fill at this time     Changes to insurance: No    Delivery Scheduled: Yes, Expected medication delivery date: 12/23/22.     Medication will be delivered via Next Day Courier to the confirmed prescription address in [facility]    The patient will receive a drug information handout for each medication shipped and additional FDA Medication Guides as required.  Verified that patient has previously received a Conservation officer, historic buildings and a Surveyor, mining.    The patient or caregiver noted above participated in the development of this care plan and knows that they can request review of or adjustments to the care plan at any time.      All of the patient's questions and concerns have been addressed.    Kermit Balo, [facility]   [facility] Specialty Pharmacist

## 2022-12-17 ENCOUNTER — Ambulatory Visit: Admit: 2022-12-17 | Discharge: 2022-12-18 | Payer: MEDICAID

## 2022-12-17 DIAGNOSIS — E1140 Type 2 diabetes mellitus with diabetic neuropathy, unspecified: Principal | ICD-10-CM

## 2022-12-17 DIAGNOSIS — E1165 Type 2 diabetes mellitus with hyperglycemia: Principal | ICD-10-CM

## 2022-12-17 DIAGNOSIS — E113293 Type 2 diabetes mellitus with mild nonproliferative diabetic retinopathy without macular edema, bilateral: Principal | ICD-10-CM

## 2022-12-17 DIAGNOSIS — Z794 Long term (current) use of insulin: Principal | ICD-10-CM

## 2022-12-17 DIAGNOSIS — H3589 Other specified retinal disorders: Principal | ICD-10-CM

## 2022-12-17 MED ORDER — PREGABALIN 75 MG CAPSULE
ORAL_CAPSULE | 0 refills | 0 days | Status: CP
Start: 2022-12-17 — End: ?

## 2022-12-17 MED ORDER — METFORMIN 1,000 MG TABLET
ORAL_TABLET | 3 refills | 0 days
Start: 2022-12-17 — End: ?

## 2022-12-17 NOTE — Unmapped (Signed)
[facility]    Initial History of present illness:   The 32 yr old female presents for a diabetic eye exam (consult from Dr. Conley Rolls). Pt notes presence of flashes (first noticed several months ago), floaters (present for +15 yrs), and (on-and-off) veil/ curtain both eyes. She denies bleeding both eyes.    - No gtts.   - Last BS: 310  - Highest BS in past 6-8 wks: in the 420's (taken shortly after eating)  - Last A1C: 8.2  - Pt sees Dr. Gregary Cromer w/ [facility]       has a past medical history of Abdominal pain, RUQ (01/08/2018), Abnormal Pap smear (09/28/2012), Anxiety, Fatty liver, Major depressive disorder, Migraine, Obesity, Peripheral neuropathy (03/14/2013), Prior Outpatient Treatment/Testing (06/15/2017), Psychiatric Hospitalizations (06/15/2017), Psychiatric Medication Trials (06/15/2017), PTSD (post-traumatic stress disorder) (06/15/2017), Restrictive lung disease, Schizo affective schizophrenia (CMS-HCC), Self-injurious behavior (06/15/2017), Suicidal ideation (06/15/2017), and Thyromegaly (02/04/2021).     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    1) follow up one year oct  2) optom six months      # mild NPDR without macular edema right eye  trace  monitor      # diabetes type 2  Hbg A1c: 8.2 (12/03/2022), 9.6 (08/26/2022)  The patient was advised to maintain good  blood pressure control, favorable levels of cholesterol and tight glucose control with goal Hbg A1c <7%.     # Cataracts both eyes   trace  observe    # Leukemia  Symptoms stable  Continue follow-up with hematology oncology      Copy to :   Referring provider   Primary Care Provider Erdem, Mauricio Po, MD    INTERPRETATION EXTENDED OPHTHALMOSCOPY MACULA (90Dlens)  Macula - right:  Vitreous syneresis, macula flat dry normal, Trace Microaneurysms  Macula - left:  Vitreous syneresis, macula flat dry normal         12/17/2022   I saw and evaluated the patient, participating in the key portions of the service.  I reviewed the resident???s note.  I agree with the resident???s findings and plan including interpretation of images.

## 2022-12-18 ENCOUNTER — Ambulatory Visit: Admit: 2022-12-18 | Discharge: 2022-12-18 | Payer: MEDICAID

## 2022-12-18 MED ORDER — METFORMIN 1,000 MG TABLET
ORAL_TABLET | 0 refills | 0 days | Status: CP
Start: 2022-12-18 — End: ?

## 2022-12-18 MED ADMIN — gadoterate meglumine (DOTAREM) Soln 15 mL: 15 mL | INTRAVENOUS | @ 19:00:00 | Stop: 2022-12-18

## 2022-12-18 NOTE — Unmapped (Signed)
LOV: 08/27/22    Upcoming: 12/19/22    Refill request for Metformin sent per protocol.

## 2022-12-19 ENCOUNTER — Ambulatory Visit: Admit: 2022-12-19 | Discharge: 2022-12-20 | Payer: MEDICAID

## 2022-12-19 DIAGNOSIS — E1165 Type 2 diabetes mellitus with hyperglycemia: Principal | ICD-10-CM

## 2022-12-19 DIAGNOSIS — E782 Mixed hyperlipidemia: Principal | ICD-10-CM

## 2022-12-19 DIAGNOSIS — R9089 Other abnormal findings on diagnostic imaging of central nervous system: Principal | ICD-10-CM

## 2022-12-19 DIAGNOSIS — Z794 Long term (current) use of insulin: Principal | ICD-10-CM

## 2022-12-19 MED ORDER — DEXCOM G6 TRANSMITTER DEVICE
3 refills | 0.00000 days | Status: CP
Start: 2022-12-19 — End: 2022-12-19

## 2022-12-19 MED ORDER — MOUNJARO 2.5 MG/0.5 ML SUBCUTANEOUS PEN INJECTOR
SUBCUTANEOUS | 0 refills | 28 days | Status: CP
Start: 2022-12-19 — End: 2023-01-10

## 2022-12-19 MED ORDER — DEXCOM G6 SENSOR DEVICE
11 refills | 0 days | Status: CP
Start: 2022-12-19 — End: 2022-12-19

## 2022-12-19 NOTE — Unmapped (Signed)
Dear Jenna Mosley,  MRI showed atrophy or shrinkage of the brain with no evidence of cancer.  The mucous retention cyst is was seen on previous imaging of your brain.  There is a question of calvarial heterogeneity which means the bone of your skull showed some abnormalities and they are recommending PET/CT scan.  I am going to order that study to further evaluate.   Brain atrophy is likely caused by the medications you have been on to manage her mood and also possibly due to sleep apnea.    Please call the [facility] Radiology office at [phone] to schedule a time for pet/ct to evaluate the bones in your skull.
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
## 2022-12-19 NOTE — Unmapped (Addendum)
Resume Mounjaro 2.5 mg weekly for 4 weeks, then increase to 5 mg weekly for 4 weeks, then increase to 7.5 mg weekly and can stay on this dose until next visit.   Continue Humulin R U500 110 units every morning and 100 every evening.   Continue Metformin 1000 mg twice  Continue monitoring glucose before each meal or with Dexcom and can decrease evening dose of U500 by 10 units if having hypoglycemia until hypoglycemia improves.

## 2022-12-22 MED FILL — BOSULIF 500 MG TABLET: ORAL | 30 days supply | Qty: 30 | Fill #4

## 2022-12-25 ENCOUNTER — Ambulatory Visit: Admit: 2022-12-25 | Discharge: 2022-12-26 | Payer: MEDICAID

## 2022-12-26 ENCOUNTER — Ambulatory Visit: Admit: 2022-12-26 | Discharge: 2022-12-27 | Payer: MEDICAID

## 2022-12-26 MED ADMIN — Fluorine F-18 FDG 4-40 mCi IV: 5 | INTRAVENOUS | @ 20:00:00 | Stop: 2022-12-26

## 2022-12-27 NOTE — Unmapped (Signed)
There was nothing in the skull showing any abnormalities.  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      Willia Craze, MD MPH  [facility]  [facility]  [address], [place], [place] [address]  p [phone]
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
## 2022-12-30 ENCOUNTER — Other Ambulatory Visit: Admit: 2022-12-30 | Discharge: 2022-12-31 | Payer: MEDICAID

## 2022-12-30 ENCOUNTER — Ambulatory Visit: Admit: 2022-12-30 | Discharge: 2022-12-31 | Payer: MEDICAID

## 2022-12-30 ENCOUNTER — Ambulatory Visit
Admit: 2022-12-30 | Discharge: 2022-12-31 | Payer: MEDICAID | Attending: Student in an Organized Health Care Education/Training Program | Primary: Student in an Organized Health Care Education/Training Program

## 2022-12-30 DIAGNOSIS — C9210 Chronic myeloid leukemia, BCR/ABL-positive, not having achieved remission: Principal | ICD-10-CM

## 2022-12-30 LAB — CBC W/ AUTO DIFF
BASOPHILS ABSOLUTE COUNT: 0 10*9/L (ref 0.0–0.1)
BASOPHILS RELATIVE PERCENT: 0.3 %
EOSINOPHILS ABSOLUTE COUNT: 0.2 10*9/L (ref 0.0–0.5)
EOSINOPHILS RELATIVE PERCENT: 1.7 %
HEMATOCRIT: 38.7 % (ref 34.0–44.0)
HEMOGLOBIN: 12.7 g/dL (ref 11.3–14.9)
LYMPHOCYTES ABSOLUTE COUNT: 3.5 10*9/L (ref 1.1–3.6)
LYMPHOCYTES RELATIVE PERCENT: 35 %
MEAN CORPUSCULAR HEMOGLOBIN CONC: 32.8 g/dL (ref 32.0–36.0)
MEAN CORPUSCULAR HEMOGLOBIN: 26.9 pg (ref 25.9–32.4)
MEAN CORPUSCULAR VOLUME: 82.1 fL (ref 77.6–95.7)
MEAN PLATELET VOLUME: 9 fL (ref 6.8–10.7)
MONOCYTES ABSOLUTE COUNT: 0.4 10*9/L (ref 0.3–0.8)
MONOCYTES RELATIVE PERCENT: 3.7 %
NEUTROPHILS ABSOLUTE COUNT: 6 10*9/L (ref 1.8–7.8)
NEUTROPHILS RELATIVE PERCENT: 59.3 %
PLATELET COUNT: 247 10*9/L (ref 150–450)
RED BLOOD CELL COUNT: 4.72 10*12/L (ref 3.95–5.13)
RED CELL DISTRIBUTION WIDTH: 15 % (ref 12.2–15.2)
WBC ADJUSTED: 10 10*9/L (ref 3.6–11.2)

## 2022-12-30 LAB — COMPREHENSIVE METABOLIC PANEL
ALBUMIN: 3.9 g/dL (ref 3.4–5.0)
ALKALINE PHOSPHATASE: 139 U/L — ABNORMAL HIGH (ref 46–116)
ALT (SGPT): 12 U/L (ref 10–49)
ANION GAP: 9 mmol/L (ref 5–14)
AST (SGOT): 18 U/L (ref <=34)
BILIRUBIN TOTAL: 0.3 mg/dL (ref 0.3–1.2)
BLOOD UREA NITROGEN: 26 mg/dL — ABNORMAL HIGH (ref 9–23)
BUN / CREAT RATIO: 20
CALCIUM: 10.1 mg/dL (ref 8.7–10.4)
CHLORIDE: 104 mmol/L (ref 98–107)
CO2: 22 mmol/L (ref 20.0–31.0)
CREATININE: 1.33 mg/dL — ABNORMAL HIGH
EGFR CKD-EPI (2021) FEMALE: 54 mL/min/{1.73_m2} — ABNORMAL LOW (ref >=60)
GLUCOSE RANDOM: 144 mg/dL (ref 70–179)
POTASSIUM: 4.4 mmol/L (ref 3.4–4.8)
PROTEIN TOTAL: 7.8 g/dL (ref 5.7–8.2)
SODIUM: 135 mmol/L (ref 135–145)

## 2022-12-30 NOTE — Unmapped (Signed)
It was great to see you today! Your labs look good, I will update you on the results of your BCR-ABL.  I think IV diuresis tomorrow for your swelling is a good idea    Continue your Bosutinib 500 mg    RTC in 3 months. ffffff    If you have questions or concerns, you may call the Nurse call line at [phone].    Lab on 12/30/2022   Component Date Value Ref Range Status    Sodium 12/30/2022 135  135 - 145 mmol/L Final    Potassium 12/30/2022 4.4  3.4 - 4.8 mmol/L Final    Chloride 12/30/2022 104  98 - 107 mmol/L Final    CO2 12/30/2022 22.0  20.0 - 31.0 mmol/L Final    Anion Gap 12/30/2022 9  5 - 14 mmol/L Final    BUN 12/30/2022 26 (H)  9 - 23 mg/dL Final    Creatinine 09/81/1914 1.33 (H)  0.55 - 1.02 mg/dL Final    BUN/Creatinine Ratio 12/30/2022 20   Final    eGFR CKD-EPI (2021) Female 12/30/2022 54 (L)  >=60 mL/min/1.58m2 Final    eGFR calculated with CKD-EPI 2021 equation in accordance with [employer] and [employer] of Nephrology Task Force recommendations.    Glucose 12/30/2022 144  70 - 179 mg/dL Final    Calcium 78/29/5621 10.1  8.7 - 10.4 mg/dL Final    Albumin 30/86/5784 3.9  3.4 - 5.0 g/dL Final    Total Protein 12/30/2022 7.8  5.7 - 8.2 g/dL Final    Total Bilirubin 12/30/2022 0.3  0.3 - 1.2 mg/dL Final    AST 69/62/9528 18  <=34 U/L Final    ALT 12/30/2022 12  10 - 49 U/L Final    Alkaline Phosphatase 12/30/2022 139 (H)  46 - 116 U/L Final    WBC 12/30/2022 10.0  3.6 - 11.2 10*9/L Final    RBC 12/30/2022 4.72  3.95 - 5.13 10*12/L Final    HGB 12/30/2022 12.7  11.3 - 14.9 g/dL Final    HCT 41/32/4401 38.7  34.0 - 44.0 % Final    MCV 12/30/2022 82.1  77.6 - 95.7 fL Final    MCH 12/30/2022 26.9  25.9 - 32.4 pg Final    MCHC 12/30/2022 32.8  32.0 - 36.0 g/dL Final    RDW 02/72/5366 15.0  12.2 - 15.2 % Final    MPV 12/30/2022 9.0  6.8 - 10.7 fL Final    Platelet 12/30/2022 247  150 - 450 10*9/L Final    Neutrophils % 12/30/2022 59.3  % Final    Lymphocytes % 12/30/2022 35.0  % Final Monocytes % 12/30/2022 3.7  % Final    Eosinophils % 12/30/2022 1.7  % Final    Basophils % 12/30/2022 0.3  % Final    Absolute Neutrophils 12/30/2022 6.0  1.8 - 7.8 10*9/L Final    Absolute Lymphocytes 12/30/2022 3.5  1.1 - 3.6 10*9/L Final    Absolute Monocytes 12/30/2022 0.4  0.3 - 0.8 10*9/L Final    Absolute Eosinophils 12/30/2022 0.2  0.0 - 0.5 10*9/L Final    Absolute Basophils 12/30/2022 0.0  0.0 - 0.1 10*9/L Final    Hypochromasia 12/30/2022 Slight (A)  Not Present Final

## 2022-12-31 ENCOUNTER — Ambulatory Visit: Admit: 2022-12-31 | Discharge: 2023-01-04 | Payer: MEDICAID

## 2022-12-31 ENCOUNTER — Ambulatory Visit
Admit: 2022-12-31 | Discharge: 2023-01-04 | Disposition: A | Discharge status: Home / Self Care | Payer: MEDICAID | Admitting: Student in an Organized Health Care Education/Training Program

## 2022-12-31 LAB — BASIC METABOLIC PANEL
ANION GAP: 9 mmol/L (ref 5–14)
BLOOD UREA NITROGEN: 24 mg/dL — ABNORMAL HIGH (ref 9–23)
BUN / CREAT RATIO: 12
CALCIUM: 9.3 mg/dL (ref 8.7–10.4)
CHLORIDE: 104 mmol/L (ref 98–107)
CO2: 21.3 mmol/L (ref 20.0–31.0)
CREATININE: 1.93 mg/dL — ABNORMAL HIGH
EGFR CKD-EPI (2021) FEMALE: 35 mL/min/{1.73_m2} — ABNORMAL LOW (ref >=60)
GLUCOSE RANDOM: 425 mg/dL — ABNORMAL HIGH (ref 70–179)
POTASSIUM: 5 mmol/L — ABNORMAL HIGH (ref 3.4–4.8)
SODIUM: 134 mmol/L — ABNORMAL LOW (ref 135–145)

## 2022-12-31 LAB — CBC W/ AUTO DIFF
BASOPHILS ABSOLUTE COUNT: 0.1 10*9/L (ref 0.0–0.1)
BASOPHILS RELATIVE PERCENT: 0.7 %
EOSINOPHILS ABSOLUTE COUNT: 0.2 10*9/L (ref 0.0–0.5)
EOSINOPHILS RELATIVE PERCENT: 2.1 %
HEMATOCRIT: 36 % (ref 34.0–44.0)
HEMOGLOBIN: 11.6 g/dL (ref 11.3–14.9)
LYMPHOCYTES ABSOLUTE COUNT: 2.6 10*9/L (ref 1.1–3.6)
LYMPHOCYTES RELATIVE PERCENT: 32.9 %
MEAN CORPUSCULAR HEMOGLOBIN CONC: 32.1 g/dL (ref 32.0–36.0)
MEAN CORPUSCULAR HEMOGLOBIN: 27 pg (ref 25.9–32.4)
MEAN CORPUSCULAR VOLUME: 84.2 fL (ref 77.6–95.7)
MEAN PLATELET VOLUME: 8.8 fL (ref 6.8–10.7)
MONOCYTES ABSOLUTE COUNT: 0.5 10*9/L (ref 0.3–0.8)
MONOCYTES RELATIVE PERCENT: 6.2 %
NEUTROPHILS ABSOLUTE COUNT: 4.5 10*9/L (ref 1.8–7.8)
NEUTROPHILS RELATIVE PERCENT: 58.1 %
NUCLEATED RED BLOOD CELLS: 0 /100{WBCs} (ref <=4)
PLATELET COUNT: 228 10*9/L (ref 150–450)
RED BLOOD CELL COUNT: 4.28 10*12/L (ref 3.95–5.13)
RED CELL DISTRIBUTION WIDTH: 15.3 % — ABNORMAL HIGH (ref 12.2–15.2)
WBC ADJUSTED: 7.8 10*9/L (ref 3.6–11.2)

## 2022-12-31 LAB — HEPATIC FUNCTION PANEL
ALBUMIN: 3.7 g/dL (ref 3.4–5.0)
ALKALINE PHOSPHATASE: 147 U/L — ABNORMAL HIGH (ref 46–116)
ALT (SGPT): 11 U/L (ref 10–49)
AST (SGOT): 11 U/L (ref <=34)
BILIRUBIN DIRECT: 0.1 mg/dL (ref 0.00–0.30)
BILIRUBIN TOTAL: 0.3 mg/dL (ref 0.3–1.2)
PROTEIN TOTAL: 7.4 g/dL (ref 5.7–8.2)

## 2022-12-31 LAB — PROTIME-INR
INR: 1.07
PROTIME: 11.8 s (ref 9.9–12.6)

## 2023-01-01 LAB — BASIC METABOLIC PANEL
ANION GAP: 9 mmol/L (ref 5–14)
ANION GAP: 7 mmol/L (ref 5–14)
BLOOD UREA NITROGEN: 31 mg/dL — ABNORMAL HIGH (ref 9–23)
BLOOD UREA NITROGEN: 30 mg/dL — ABNORMAL HIGH (ref 9–23)
BUN / CREAT RATIO: 20
BUN / CREAT RATIO: 22
CALCIUM: 9.5 mg/dL (ref 8.7–10.4)
CALCIUM: 9.7 mg/dL (ref 8.7–10.4)
CHLORIDE: 104 mmol/L (ref 98–107)
CHLORIDE: 104 mmol/L (ref 98–107)
CO2: 23.5 mmol/L (ref 20.0–31.0)
CO2: 25.5 mmol/L (ref 20.0–31.0)
CREATININE: 1.55 mg/dL — ABNORMAL HIGH
CREATININE: 1.39 mg/dL — ABNORMAL HIGH
EGFR CKD-EPI (2021) FEMALE: 45 mL/min/{1.73_m2} — ABNORMAL LOW (ref >=60)
EGFR CKD-EPI (2021) FEMALE: 51 mL/min/{1.73_m2} — ABNORMAL LOW (ref >=60)
GLUCOSE RANDOM: 219 mg/dL — ABNORMAL HIGH (ref 70–179)
GLUCOSE RANDOM: 257 mg/dL — ABNORMAL HIGH (ref 70–179)
POTASSIUM: 4.4 mmol/L (ref 3.4–4.8)
POTASSIUM: 4.7 mmol/L (ref 3.4–4.8)
SODIUM: 136 mmol/L (ref 135–145)
SODIUM: 136 mmol/L (ref 135–145)

## 2023-01-01 LAB — CBC
HEMATOCRIT: 35.4 % (ref 34.0–44.0)
HEMOGLOBIN: 11.7 g/dL (ref 11.3–14.9)
MEAN CORPUSCULAR HEMOGLOBIN CONC: 33.1 g/dL (ref 32.0–36.0)
MEAN CORPUSCULAR HEMOGLOBIN: 27.3 pg (ref 25.9–32.4)
MEAN CORPUSCULAR VOLUME: 82.5 fL (ref 77.6–95.7)
MEAN PLATELET VOLUME: 8.6 fL (ref 6.8–10.7)
PLATELET COUNT: 247 10*9/L (ref 150–450)
RED BLOOD CELL COUNT: 4.29 10*12/L (ref 3.95–5.13)
RED CELL DISTRIBUTION WIDTH: 15.1 % (ref 12.2–15.2)
WBC ADJUSTED: 10.5 10*9/L (ref 3.6–11.2)

## 2023-01-01 LAB — B-TYPE NATRIURETIC PEPTIDE: B-TYPE NATRIURETIC PEPTIDE: 32.32 pg/mL (ref <=100)

## 2023-01-01 LAB — MAGNESIUM: MAGNESIUM: 1.7 mg/dL (ref 1.6–2.6)

## 2023-01-01 LAB — UREA NITROGEN, URINE: UREA NITROGEN URINE: 324 mg/dL

## 2023-01-01 LAB — OSMOLALITY, RANDOM URINE: OSMOLALITY URINE: 338 mosm/kg

## 2023-01-01 LAB — CREATININE, URINE: CREATININE, URINE: 59.1 mg/dL

## 2023-01-01 MED ADMIN — pregabalin (LYRICA) capsule 75 mg: 75 mg | ORAL | @ 13:00:00

## 2023-01-01 MED ADMIN — pantoprazole (Protonix) EC tablet 40 mg: 40 mg | ORAL | @ 13:00:00

## 2023-01-01 MED ADMIN — ziprasidone (GEODON) capsule 80 mg: 80 mg | ORAL | @ 14:00:00

## 2023-01-01 MED ADMIN — insulin lispro (HumaLOG) injection 0-20 Units: 0-20 [IU] | SUBCUTANEOUS | @ 21:00:00

## 2023-01-01 MED ADMIN — hydroCHLOROthiazide (HYDRODIURIL) tablet 25 mg: 25 mg | ORAL | @ 13:00:00

## 2023-01-01 MED ADMIN — atenolol (TENORMIN) tablet 100 mg: 100 mg | ORAL | @ 14:00:00

## 2023-01-01 MED ADMIN — ferrous sulfate tablet 325 mg: 325 mg | ORAL | @ 13:00:00

## 2023-01-01 MED ADMIN — furosemide (LASIX) injection 80 mg: 80 mg | INTRAVENOUS | @ 19:00:00

## 2023-01-01 MED ADMIN — lamoTRIgine (LaMICtal) tablet 200 mg: 200 mg | ORAL | @ 13:00:00 | Stop: 2023-01-01

## 2023-01-01 MED ADMIN — insulin lispro (HumaLOG) injection 0-20 Units: 0-20 [IU] | SUBCUTANEOUS | @ 18:00:00

## 2023-01-01 MED ADMIN — enoxaparin (LOVENOX) syringe 40 mg: 40 mg | SUBCUTANEOUS | @ 08:00:00

## 2023-01-01 MED ADMIN — furosemide (LASIX) injection 80 mg: 80 mg | INTRAVENOUS | @ 05:00:00 | Stop: 2022-12-31

## 2023-01-01 MED ADMIN — perflutren lipid microspheres (DEFINITY) injection 2 mL: 2 mL | INTRAVENOUS | @ 17:00:00 | Stop: 2023-01-01

## 2023-01-01 MED ADMIN — insulin lispro (HumaLOG) injection 0-20 Units: 0-20 [IU] | SUBCUTANEOUS | @ 13:00:00

## 2023-01-01 MED ADMIN — insulin glargine (LANTUS) injection: 100 [IU] | SUBCUTANEOUS | @ 10:00:00 | Stop: 2023-01-01

## 2023-01-01 NOTE — Unmapped (Addendum)
Pt c/o fluid retention/abd distention increased over the past month, pt is on lasix and referred to the ED for further eval and treatment, possible paracentesis. States it is causing more difficulty to breath.Marland Kitchen
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
## 2023-01-01 NOTE — Unmapped (Signed)
Care Management  Initial Transition Planning Assessment              General  Care Manager assessed the patient by : In person interview with patient, Medical record review, Discussion with Clinical Care team  Orientation Level: Oriented X4  Functional level prior to admission: Independent  Reason for referral: Discharge Planning    Contact/Decision [facility]  Extended Emergency Contact Information  Primary Emergency Contact: Mousa,Denise  Address: [address] [place], [place] [address] [place]  Home Phone: [phone]  Mobile Phone: [phone]  Relation: Mother  Preferred language: ENGLISH  Interpreter needed? No    Type of Residence: Mailing Address:  [address]  [place] [address]  Contacts: Accompanied by: Alone  Patient Phone Number: [phone] (cell)           Medical Provider(s): Harlow Mares, MD  Reason for Admission: Admitting Diagnosis:  AKI (acute kidney injury) (CMS-HCC) [N17.9]  Dyspnea, unspecified type [R06.00]  Past Medical History:   has a past medical history of Abdominal pain, RUQ (01/08/2018), Abnormal Pap smear (09/28/2012), Anxiety, Fatty liver, Major depressive disorder, Migraine, Obesity, Peripheral neuropathy (03/14/2013), Prior Outpatient Treatment/Testing (06/15/2017), Psychiatric Hospitalizations (06/15/2017), Psychiatric Medication Trials (06/15/2017), PTSD (post-traumatic stress disorder) (06/15/2017), Restrictive lung disease, Schizo affective schizophrenia (CMS-HCC), Self-injurious behavior (06/15/2017), Suicidal ideation (06/15/2017), and Thyromegaly (02/04/2021).  Past Surgical History:   has a past surgical history that includes Tonsillectomy; Lymphangioma excision; Colonoscopy (2011); Skin biopsy; pr upper gi endoscopy,biopsy (N/A, 10/24/2019); removal of impacted tooth partially bony (Right, 07/16/2020); surgical removal (Bilateral, 07/16/2020); and Wisdom tooth extraction.   Previous admit date: 04/07/2020    Primary Insurance- Payor: MEDICAID [place] / Plan: MEDICAID [place] ACCESS / Product Type: *No Product type* /   Secondary Insurance - None  Prescription Coverage -   Preferred Pharmacy - [facility] - [place], [place] - [address]  [id number] - [place], [place] - [address] AT [facility] OF [address]    Transportation home: Building services engineer Next of Kin / Guardian / POA / Advance Directives     HCDM (patient stated preference) (Active): [name] - Mother - [phone]    Advance Directive (Medical Treatment)  Does patient have an advance directive covering medical treatment?: Patient has advance directive covering medical treatment, copy in chart.  Reason patient does not have an advance directive covering medical treatment:: Patient has been deemed unable to make medical decisions, cannot communicate.    Health Care Decision Maker [HCDM] (Medical & Mental Health Treatment)  Healthcare Decision Maker: HCDM documented in the HCDM/Contact Info section.  Information offered on HCDM, Medical & Mental Health advance directives:: Other (Comment) (documentation in chart)    Advance Directive (Mental Health Treatment)  Does patient have an advance directive covering mental health treatment?: Patient has advance directive covering mental health treatment, copy in chart.  Reason patient does not have an advance directive covering mental health treatment:: HCDM documented in the HCDM/Contact Info section.    Readmission Information    Have you been hospitalized in the last 30 days?: No     Did the following happen with your discharge?  Patient Information  Lives with: Parent  Type of Residence: Private residence   Support Systems/Concerns: Parent  Responsibilities/Dependents at home?: No  Home Care services in place prior to admission?: No   Outpatient/Community Resources in place prior  to admission: Clinic  Agency detail (Name/Phone #): Dr Willia Craze PCP  Equipment Currently Used at Home: none  Currently receiving outpatient dialysis?: No     Financial Information     Need for financial assistance?: No     Social Determinants of Health  Social Determinants of Health     Financial Resource Strain: Medium Risk (01/01/2023)    Overall Financial Resource Strain (CARDIA)     Difficulty of Paying Living Expenses: Somewhat hard   Internet Connectivity: Not on file   Food Insecurity: Food Insecurity Present (01/01/2023)    Hunger Vital Sign     Worried About Running Out of Food in the Last Year: Sometimes true     Ran Out of Food in the Last Year: Sometimes true   Tobacco Use: Medium Risk (12/31/2022)    Patient History     Smoking Tobacco Use: Former     Smokeless Tobacco Use: Never     Passive Exposure: Not on file   Housing/Utilities: Low Risk  (01/01/2023)    Housing/Utilities     Within the past 12 months, have you ever stayed: outside, in a car, in a tent, in an overnight shelter, or temporarily in someone else's home (i.e. couch-surfing)?: No     Are you worried about losing your housing?: No     Within the past 12 months, have you been unable to get utilities (heat, electricity) when it was really needed?: No   Alcohol Use: Not At Risk (05/14/2021)    Alcohol Use     How often do you have a drink containing alcohol?: Never     How many drinks containing alcohol do you have on a typical day when you are drinking?: Not on file     How often do you have 5 or more drinks on one occasion?: Not on file   Transportation Needs: No Transportation Needs (01/01/2023)    PRAPARE - Transportation     Lack of Transportation (Medical): No     Lack of Transportation (Non-Medical): No   Substance Use: Not on file   Health Literacy: Medium Risk (05/14/2021)    Health Literacy     : Sometimes   Physical Activity: Not on file   Interpersonal Safety: Not on file   Stress: Not on file   Intimate Partner Violence: Not on file   Depression: Not at risk (04/10/2021)    PHQ-2     PHQ-2 Score: 2   Recent Concern: Depression - At risk (04/02/2021) PHQ-2     PHQ-2 Score: 3   Social Connections: Not on file       Complex Discharge Information    Is patient identified as a difficult/complex discharge?: No     Interventions:     Discharge Needs Assessment  Concerns to be Addressed: denies needs/concerns at this time, adjustment to diagnosis/illness  Clinical Risk Factors: New Diagnosis  Barriers to taking medications: No  Prior overnight hospital stay or ED visit in last 90 days: No  Anticipated Changes Related to Illness: none  Equipment Needed After Discharge: none    Discharge Facility/Level of Care Needs: other (see comments) (Home)    Readmission  Risk of Unplanned Readmission Score: UNPLANNED READMISSION SCORE: 17.38%  Predictive Model Details          17% (Medium)  Factor Value    Calculated 01/01/2023 08:04 27% Number of active inpatient medication orders 37    [place] Risk of Unplanned Readmission Model 11% Diagnosis of cancer  present     10% Active antipsychotic inpatient medication order present     10% ECG/EKG order present in last 6 months     8% Latest BUN high (24 mg/dL)     7% Imaging order present in last 6 months     6% Charlson Comorbidity Index 5     6% Number of ED visits in last six months 1     5% Active anticoagulant inpatient medication order present     5% Latest creatinine high (1.93 mg/dL)     3% Age 1     2% Future appointment scheduled     1% Active ulcer inpatient medication order present     0% Current length of stay 0.17 days      Readmitted Within the Last 30 Days? (No if blank)   Patient at risk for readmission?: No    Discharge Plan  Screen findings are: Care Manager reviewed the plan of the patient's care with the Multidisciplinary Team. No discharge planning needs identified at this time. Care Manager will continue to manage plan and monitor patient's progress with the team.    Expected Discharge Date: TBD    Expected Transfer from Critical Care:  NA    Quality data for continuing care services shared with patient and/or representative?: Yes  Patient and/or family were provided with choice of facilities / services that are available and appropriate to meet post hospital care needs?: Yes   List choices in order highest to lowest preferred, if applicable. : No preference    Initial Assessment complete?: Yes

## 2023-01-01 NOTE — Unmapped (Signed)
Family Medicine Inpatient Service  Progress Note    Team: [facility] (pgr [phone])    Hospital Day: 0    ASSESSMENT / PLAN:   Jenna Mosley is a 32 y.o. female with a past medical history significant for [name], DM, Restrictive lung disease, NAFLD who presents with AKI.     # AKI  Cr of 1.93 on admission with Cr 1.33 the day prior. Cr appears to be steadily increasing over the past few months. Recent increase in home PO lasix from 20mg  daily to 60mg  daily in setting of concern for fluid overload. S/p 80mg  IV lasix in the ED followed by 1 L LR. Cr improved 1.5, AKI likely in the setting of cardiorenal syndrome given sx orthopnea and 10lb wt gain. Will continue with diuresis as patient has had appropriate output response. Continue with IV lasix 80, transition back to oral lasix after improvement in kidney function and sx improvement.   - Daily BMP  - continue IV lasix 80      # Hyperkalemia  K of 5 on admission, most likely due to hyperglycemia.   - Restarting home insulin  - Daily BMP     # Shortness of Breath - Hx Restrictive Lung Disease  Chronic, worsening over the past few months. Concern for fluid overload given recent weight gain in her abdomen, recently increased PO lasix. Followed by Pulmonology. Has had PFTs previously, Pulm planning for repeat at next visit. 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 No asthma diagnosis. Previous sleep studies not consistent with [name], referral has been placed for updated sleep study per patient. Echo done 12/31/22 with normal EF, only significant for mildly elevated right atrial pressure. Most likely multifactorial due to OHS, deconditioning, volume overload.     - BMP  - Outpatient Pulm follow up  - held home Symbicort, prn albuterol     # [name] with diabetic neuropathy  Follows with [facility]. Last HbA1c A1c 8.2% on 12/03/22. Neuropathy below level of mid-shins. Current regimen includes metformin 1000 mg bid, mounjaro, Humulin R U500 110 units every morning and 100 every evening. will continue with lantus 100U given endocrinology is not present at [facility] to re dose Humulin U500.     - POC glucose checks  - Regular insulin 100 units   - SSI     # Bipolar disorder - PTSD  - Continue home meds hydroxyzine, lamictal, depakote, trazodone, geodon, lyrica.      # [name]  Follows with Oncology, in MMR on Bosutinib.  - Continue home Bosutinib     # Hx NAFLD  Alk phos elevated, similar to prior. CT CAP during ED visit 02/06/21 showed variably enhancing liver lesions, most consistent with hemangioma, follow-up MRI abdomen 02/26/21 showed multisegmental solid hepatic neoplasia, favoring FNH and hepatic adenomatosis in setting of NAFLD. 12/14/21 CTAP noted normal liver contour, no focal liver lesions.  - Outpatient follow up     # HTN  - Continue home hydrochlorothiazide, atenolol  - Hold valsartan in setting of AKI       # Checklist:  - IVF None  - Daily labs needed: CBC, BMP, and Magnesium  - Diet Regular  - Bowel Regimen: Miralax 17 g daily and Senna 2 tabs qHS  - DVT: SQ Lovenox  - Code Status:   Orders Placed This Encounter   Procedures    Full Code     Standing Status:   Standing     Number of Occurrences:  1     - Dispo: Floor    [ ]  Anticipated Discharge Location: Home  [ ]  PT/OT/DME: No needs anticipated  [ ]  CM/SW needs: None anticipated  [ ]  Follow up appt: Appointment needed    SUBJECTIVE:  Interval events: NEON. Report urinating twice since last night. Notice SOB when lying flat.       REVIEW OF SYSTEMS:  Pertinent positives and negatives per HPI. A complete review of systems otherwise negative.    PHYSICAL EXAM:      Intake/Output Summary (Last 24 hours) at 01/01/2023 1657  Last data filed at 01/01/2023 1206  Gross per 24 hour   Intake 400 ml   Output 2150 ml   Net -1750 ml       Recent Vitals:  Vitals:    01/01/23 1154   BP: 135/61   Pulse: 61   Resp: 16   Temp: 36.6 ??C (97.9 ??F)   SpO2: 97%       GEN: well appearing, lying in bed, NAD   HEENT: NCAT, No scleral icterus. Conjunctiva non-erythematous. MMM.  CV: Regular rate and rhythm. No murmurs/rubs/gallops.  Pulm: Normal work of breathing on RA. CTAB. No wheezing, crackles, or rhonchi.  Abd: Flat.  Nontender. No guarding, rebound.  Normoactive bowel sounds.    Neuro: A&O x 3. No focal deficits.  Ext: No peripheral edema.  Palpable distal pulses.  Skin: No rashes or skin lesions.       LABS/ STUDIES:    All imaging, laboratory studies, and other pertinent tests including electrocardiography within the last 24 hours were reviewed and are summarized within the assessment and plan.     NUTRITION:                           Rivka Spring, MD, MD PGY2  January 01, 2023 7:54 AM

## 2023-01-01 NOTE — Unmapped (Addendum)
Patient is alert and oriented x 4. VSS stable. Meds given see MAR. Patient had an echo done this shift. Mom got to bedside around 1600 and brought her Bosutinib, I placed it in her patient specific bin. She stated she can not take it past 1600. Patient is resting in bed, bed in lowest position, wheels are locked, call bell within reach. Will continue with plan of care.   Problem: Adult Inpatient Plan of Care  Goal: Absence of Hospital-Acquired Illness or Injury  Intervention: Identify and Manage Fall Risk  Recent Flowsheet Documentation  Taken 01/01/2023 1001 by Havery Moros, RN  Safety Interventions:   fall reduction program maintained   isolation precautions   nonskid shoes/slippers when out of bed  Taken 01/01/2023 0800 by Havery Moros, RN  Safety Interventions:   fall reduction program maintained   isolation precautions   nonskid shoes/slippers when out of bed  Intervention: Prevent and Manage VTE (Venous Thromboembolism) Risk  Recent Flowsheet Documentation  Taken 01/01/2023 1015 by Havery Moros, RN  VTE Prevention/Management:   ambulation promoted   anticoagulant therapy   fluids promoted  Anti-Embolism Intervention: (lovenox) Other (Comment)     Problem: Wound  Goal: Absence of Infection Signs and Symptoms  Intervention: Prevent or Manage Infection  Recent Flowsheet Documentation  Taken 01/01/2023 1001 by Havery Moros, RN  Isolation Precautions: protective precautions maintained  Taken 01/01/2023 0800 by Havery Moros, RN  Isolation Precautions: protective precautions maintained

## 2023-01-01 NOTE — Unmapped (Addendum)
Jenna Mosley is a 32 y.o. female with a past medical history significant for [name], DM, Restrictive lung disease, NAFLD who was admitted with AKI.     # AKI  Cr of 1.93 on admission with Cr 1.33 the day prior. Cr appears to be steadily increasing over the past few months. Recent increase in home PO lasix from 20mg  daily to 60mg  daily in setting of concern for fluid overload. AKI thought to be in the setting of cardiorenal syndrome given sx orthopnea and 10lb wt gain. Diuresed patient during admission initially with IV lasix, then torsemide with improvement in creatinine.  Discharged on 20mg  of torsemide.     # Hyperkalemia  K of 5 on admission, most likely due to hyperglycemia. Improved during hospital stay.     # Shortness of Breath - Hx Restrictive Lung Disease  Chronic, worsening over the past few months. Concern for fluid overload given recent weight gain in her abdomen, recently increased PO lasix. Followed by Pulmonology. Has had PFTs previously, Pulm planning for repeat at next visit. 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 No asthma diagnosis. Previous sleep studies not consistent with [name], referral has been placed for updated sleep study per patient. Echo done 12/31/22 with normal EF, only significant for mildly elevated right atrial pressure in the setting of mild Pulmonary HTN. Most likely multifactorial due to OHS, deconditioning, volume overload. Outpatient pulmonary follow up and rehab planned. Held home symbicort and gave breo.  Remained on room air during admission.     # [name] with diabetic neuropathy  Follows with [facility]. Last HbA1c A1c 8.2% on 12/03/22. Neuropathy below level of mid-shins. Current regimen includes metformin 1000 mg bid, mounjaro, Humulin R U500 110 units every morning and 100 every evening. Administered glargine and lispro during admission at very high doses with continued poor glucose control.      # Bipolar disorder - PTSD  - Continued home meds hydroxyzine, lamictal, depakote, trazodone, geodon, lyrica.      # [name]  Follows with Oncology, bosutinib recently discontinued prior to admission.  Initially ordered on admission, then discontinued when oncology notes reviewed.     # Hx NAFLD  Alk phos elevated, similar to prior. CT CAP during ED visit 02/06/21 showed variably enhancing liver lesions, most consistent with hemangioma, follow-up MRI abdomen 02/26/21 showed multisegmental solid hepatic neoplasia, favoring FNH and hepatic adenomatosis in setting of NAFLD. 12/14/21 CTAP noted normal liver contour, no focal liver lesions.     # HTN  Discontinued home hydrochlorothiazide (due to also starting lasix then torsemide). Continued atenolol, initially held valsartan in setting of AKI, then restarted prior to discharge.

## 2023-01-01 NOTE — Unmapped (Signed)
VSS. Alert and oriented x4. On room air. Family at bedside. BG 215, lantus 100 units given as ordered.  Pt. Is independent in th room, up to BR to void, strict I/O's, protective precautions due to pt. Treatment for her [name].  Pt in bed with call light in reach, bed low. No c/o pain.  No resp. Distress.       Problem: Adult Inpatient Plan of Care  Goal: Plan of Care Review  01/01/2023 0538 by Laure Kidney, RN  Outcome: Progressing  01/01/2023 0537 by Laure Kidney, RN  Outcome: Ongoing - Unchanged  Goal: Patient-Specific Goal (Individualized)  01/01/2023 0538 by Laure Kidney, RN  Outcome: Progressing  01/01/2023 0537 by Laure Kidney, RN  Outcome: Ongoing - Unchanged  Goal: Absence of Hospital-Acquired Illness or Injury  01/01/2023 0538 by Laure Kidney, RN  Outcome: Progressing  01/01/2023 0537 by Laure Kidney, RN  Outcome: Ongoing - Unchanged  Goal: Optimal Comfort and Wellbeing  01/01/2023 0538 by Laure Kidney, RN  Outcome: Progressing  01/01/2023 0537 by Laure Kidney, RN  Outcome: Ongoing - Unchanged  Goal: Readiness for Transition of Care  01/01/2023 0538 by Laure Kidney, RN  Outcome: Progressing  01/01/2023 0537 by Laure Kidney, RN  Outcome: Ongoing - Unchanged  Goal: Rounds/Family Conference  01/01/2023 0538 by Laure Kidney, RN  Outcome: Progressing  01/01/2023 0537 by Laure Kidney, RN  Outcome: Ongoing - Unchanged
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
## 2023-01-02 LAB — COMPREHENSIVE METABOLIC PANEL
ALBUMIN: 3.9 g/dL (ref 3.4–5.0)
ALKALINE PHOSPHATASE: 151 U/L — ABNORMAL HIGH (ref 46–116)
ALT (SGPT): 11 U/L (ref 10–49)
ANION GAP: 9 mmol/L (ref 5–14)
AST (SGOT): 24 U/L (ref <=34)
BILIRUBIN TOTAL: 0.5 mg/dL (ref 0.3–1.2)
BLOOD UREA NITROGEN: 28 mg/dL — ABNORMAL HIGH (ref 9–23)
BUN / CREAT RATIO: 22
CALCIUM: 9.7 mg/dL (ref 8.7–10.4)
CHLORIDE: 104 mmol/L (ref 98–107)
CO2: 23.3 mmol/L (ref 20.0–31.0)
CREATININE: 1.26 mg/dL — ABNORMAL HIGH
EGFR CKD-EPI (2021) FEMALE: 58 mL/min/{1.73_m2} — ABNORMAL LOW (ref >=60)
GLUCOSE RANDOM: 350 mg/dL — ABNORMAL HIGH (ref 70–179)
POTASSIUM: 4.6 mmol/L (ref 3.4–4.8)
PROTEIN TOTAL: 7.7 g/dL (ref 5.7–8.2)
SODIUM: 136 mmol/L (ref 135–145)

## 2023-01-02 LAB — CBC
HEMATOCRIT: 37.9 % (ref 34.0–44.0)
HEMOGLOBIN: 12.4 g/dL (ref 11.3–14.9)
MEAN CORPUSCULAR HEMOGLOBIN CONC: 32.7 g/dL (ref 32.0–36.0)
MEAN CORPUSCULAR HEMOGLOBIN: 27.4 pg (ref 25.9–32.4)
MEAN CORPUSCULAR VOLUME: 83.7 fL (ref 77.6–95.7)
MEAN PLATELET VOLUME: 9.1 fL (ref 6.8–10.7)
PLATELET COUNT: 253 10*9/L (ref 150–450)
RED BLOOD CELL COUNT: 4.53 10*12/L (ref 3.95–5.13)
RED CELL DISTRIBUTION WIDTH: 15.5 % — ABNORMAL HIGH (ref 12.2–15.2)
WBC ADJUSTED: 7.4 10*9/L (ref 3.6–11.2)

## 2023-01-02 LAB — MAGNESIUM: MAGNESIUM: 2.1 mg/dL (ref 1.6–2.6)

## 2023-01-02 MED ADMIN — traZODone (DESYREL) tablet 200 mg: 200 mg | ORAL | @ 02:00:00

## 2023-01-02 MED ADMIN — divalproex ER (DEPAKOTE ER) extended released 24 hr tablet 500 mg: 500 mg | ORAL | @ 02:00:00

## 2023-01-02 MED ADMIN — insulin lispro (HumaLOG) injection 0-20 Units: 0-20 [IU] | SUBCUTANEOUS | @ 22:00:00

## 2023-01-02 MED ADMIN — ziprasidone (GEODON) capsule 80 mg: 80 mg | ORAL | @ 13:00:00

## 2023-01-02 MED ADMIN — fluticasone furoate-vilanterol (BREO ELLIPTA) 200-25 mcg/dose inhaler 1 puff: 1 | RESPIRATORY_TRACT | @ 14:00:00

## 2023-01-02 MED ADMIN — ferrous sulfate tablet 325 mg: 325 mg | ORAL | @ 13:00:00

## 2023-01-02 MED ADMIN — pregabalin (LYRICA) capsule 150 mg: 150 mg | ORAL | @ 02:00:00

## 2023-01-02 MED ADMIN — insulin lispro (HumaLOG) injection 0-20 Units: 0-20 [IU] | SUBCUTANEOUS | @ 05:00:00

## 2023-01-02 MED ADMIN — pregabalin (LYRICA) capsule 75 mg: 75 mg | ORAL | @ 13:00:00

## 2023-01-02 MED ADMIN — lamoTRIgine (LaMICtal) tablet 150 mg: 150 mg | ORAL | @ 02:00:00

## 2023-01-02 MED ADMIN — lamoTRIgine (LaMICtal) tablet 150 mg: 150 mg | ORAL | @ 13:00:00

## 2023-01-02 MED ADMIN — hydrOXYzine (ATARAX) tablet 100 mg: 100 mg | ORAL | @ 02:00:00

## 2023-01-02 MED ADMIN — insulin glargine (LANTUS) injection: 100 [IU] | SUBCUTANEOUS | @ 14:00:00 | Stop: 2023-01-02

## 2023-01-02 MED ADMIN — insulin lispro (HumaLOG) injection 0-20 Units: 0-20 [IU] | SUBCUTANEOUS | @ 17:00:00

## 2023-01-02 MED ADMIN — insulin lispro (HumaLOG) injection 0-20 Units: 0-20 [IU] | SUBCUTANEOUS | @ 13:00:00

## 2023-01-02 MED ADMIN — furosemide (LASIX) injection 80 mg: 80 mg | INTRAVENOUS | @ 11:00:00 | Stop: 2023-01-02

## 2023-01-02 MED ADMIN — atenolol (TENORMIN) tablet 100 mg: 100 mg | ORAL | @ 13:00:00

## 2023-01-02 MED ADMIN — pantoprazole (Protonix) EC tablet 40 mg: 40 mg | ORAL | @ 13:00:00

## 2023-01-02 MED ADMIN — furosemide (LASIX) injection 80 mg: 80 mg | INTRAVENOUS | @ 20:00:00 | Stop: 2023-01-02

## 2023-01-02 MED ADMIN — hydroCHLOROthiazide (HYDRODIURIL) tablet 25 mg: 25 mg | ORAL | @ 11:00:00 | Stop: 2023-01-02

## 2023-01-02 NOTE — Unmapped (Signed)
Family Medicine Inpatient Service  Progress Note    Team: [facility] (pgr [phone])    Hospital Day: 0    ASSESSMENT / PLAN:   Jenna Mosley is a 32 y.o. female with a past medical history significant for [name], DM, Restrictive lung disease, NAFLD who presents with AKI.     # AKI  Cr of 1.93 on admission with Cr 1.33 the day prior. Cr appears to be steadily increasing over the past few months. Recent increase in home PO lasix from 20mg  daily to 60mg  daily in setting of concern for fluid overload. Cr improving 1.3 today, AKI likely in the setting of cardiorenal syndrome given sx orthopnea and 10lb wt gain. Will continue with diuresis as patient has had appropriate output response. Continue with IV lasix 80 this afternoon, transition to oral torsemide 20 after improvement in kidney function and sx improvement.   - Daily CMP  - continue IV lasix 80 this afternoon, switch to torsemide 20 mg on 12/16  -daily wt      # Hyperkalemia  K of 5 on admission, most likely due to hyperglycemia.   - Daily CMP     # Shortness of Breath - Hx Restrictive Lung Disease  Chronic, worsening over the past few months. Concern for fluid overload given recent weight gain in her abdomen, recently increased PO lasix. Followed by Pulmonology. Has had PFTs previously, Pulm planning for repeat at next visit. 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 No asthma diagnosis. Previous sleep studies not consistent with [name], referral has been placed for updated sleep study per patient. Echo done 12/31/22 with normal EF, only significant for mildly elevated right atrial pressure in the setting of mild Pulmonary HTN. Most likely multifactorial due to OHS, deconditioning, volume overload 2/2 Pulm HTN.   - CMP  - Outpatient Pulm follow up  - held home Symbicort switch to Breo, prn albuterol     # [name] with diabetic neuropathy  Follows with [facility]. Last HbA1c A1c 8.2% on 12/03/22. Neuropathy below level of mid-shins. Current regimen includes metformin 1000 mg bid, mounjaro, Humulin R U500 110 units every morning and 100 every evening. will start 80u lantus BID, given endocrinology is not present at [facility] to re dose Humulin U500.   - POC glucose checks  - Regular insulin 100 units , will transition to 80u Lantus BID 2/16  - started meal time 12u lispro   - SSI     # Bipolar disorder - PTSD  - Continue home meds hydroxyzine, lamictal, depakote, trazodone, geodon, lyrica.      # [name]  Follows with Oncology, in MMR on Bosutinib.  - Hold home Bosutinib per heme recs given concern for contribution to volume overload     # Hx NAFLD  Alk phos elevated, similar to prior. CT CAP during ED visit 02/06/21 showed variably enhancing liver lesions, most consistent with hemangioma, follow-up MRI abdomen 02/26/21 showed multisegmental solid hepatic neoplasia, favoring FNH and hepatic adenomatosis in setting of NAFLD.   - Outpatient follow up  - will require MRI w/ Enovi in the future per  her primary hepatology   - daily CMP      # HTN  - discontinue home hydrochlorothiazide, continue atenolol  - Hold valsartan in setting of AKI       # Checklist:  - IVF None  - Daily labs needed: CBC, BMP, and Magnesium  - Diet Regular  - Bowel Regimen: Miralax 17 g daily  and Senna 2 tabs qHS  - DVT: SQ Lovenox  - Code Status:   Orders Placed This Encounter   Procedures    Full Code     Standing Status:   Standing     Number of Occurrences:   1     - Dispo: Floor    [ ]  Anticipated Discharge Location: Home  [ ]  PT/OT/DME: No needs anticipated  [ ]  CM/SW needs: None anticipated  [ ]  Follow up appt: Appointment needed    SUBJECTIVE:  Interval events: NEON. Report breathing improved    REVIEW OF SYSTEMS:  Pertinent positives and negatives per HPI. A complete review of systems otherwise negative.    PHYSICAL EXAM:      Intake/Output Summary (Last 24 hours) at 01/02/2023 0855  Last data filed at 01/01/2023 1206  Gross per 24 hour   Intake 400 ml   Output 750 ml Net -350 ml       Recent Vitals:  Vitals:    01/02/23 0820   BP: 114/47   Pulse: 61   Resp: 18   Temp: 36.7 ??C (98.1 ??F)   SpO2:        GEN: well appearing, lying in bed, NAD   HEENT: NCAT, No scleral icterus. Conjunctiva non-erythematous. MMM.  CV: Regular rate and rhythm. No murmurs/rubs/gallops.  Pulm: Normal work of breathing on RA. CTAB. No wheezing, crackles, or rhonchi.  Abd: Flat.  Nontender. No guarding, rebound.  Normoactive bowel sounds.    Neuro: A&O x 3. No focal deficits.  Ext: No peripheral edema.  Palpable distal pulses.  Skin: No rashes or skin lesions.     LABS/ STUDIES:    All imaging, laboratory studies, and other pertinent tests including electrocardiography within the last 24 hours were reviewed and are summarized within the assessment and plan.     NUTRITION:                         Rivka Spring, MD, MD PGY2  January 01, 2023 7:54 AM    Daily Progress Attestation: I saw and evaluated the patient, participating in the key portions of the service.  I reviewed the resident's note.  I agree with the resident's findings and plan.  I personally spent 36 minutes face-to-face and non-face-to-face in the care of this patient, which includes all pre, intra, and post visit time on the date of service. All documented time was specific to E/M and does not include any procedures that may have been performed.      Tennis Ship, DO

## 2023-01-02 NOTE — Unmapped (Signed)
Problem: Adult Inpatient Plan of Care  Goal: Plan of Care Review  Outcome: Ongoing - Unchanged  Goal: Patient-Specific Goal (Individualized)  Outcome: Ongoing - Unchanged  Goal: Absence of Hospital-Acquired Illness or Injury  Outcome: Ongoing - Unchanged  Intervention: Identify and Manage Fall Risk  Recent Flowsheet Documentation  Taken 01/01/2023 1600 by Havery Moros, RN  Safety Interventions:   fall reduction program maintained   neutropenic precautions   nonskid shoes/slippers when out of bed  Taken 01/01/2023 1400 by Havery Moros, RN  Safety Interventions:   fall reduction program maintained   neutropenic precautions   nonskid shoes/slippers when out of bed  Taken 01/01/2023 1200 by Havery Moros, RN  Safety Interventions:   fall reduction program maintained   low bed   nonskid shoes/slippers when out of bed  Taken 01/01/2023 1001 by Havery Moros, RN  Safety Interventions:   fall reduction program maintained   isolation precautions   nonskid shoes/slippers when out of bed  Taken 01/01/2023 0800 by Havery Moros, RN  Safety Interventions:   fall reduction program maintained   isolation precautions   nonskid shoes/slippers when out of bed  Intervention: Prevent and Manage VTE (Venous Thromboembolism) Risk  Recent Flowsheet Documentation  Taken 01/01/2023 1015 by Havery Moros, RN  VTE Prevention/Management:   ambulation promoted   anticoagulant therapy   fluids promoted  Anti-Embolism Intervention: (lovenox) Other (Comment)  Goal: Optimal Comfort and Wellbeing  Outcome: Ongoing - Unchanged  Goal: Readiness for Transition of Care  Outcome: Ongoing - Unchanged  Goal: Rounds/Family Conference  Outcome: Ongoing - Unchanged     Problem: Wound  Goal: Optimal Coping  Outcome: Ongoing - Unchanged  Goal: Optimal Functional Ability  Outcome: Ongoing - Unchanged  Goal: Absence of Infection Signs and Symptoms  Outcome: Ongoing - Unchanged  Intervention: Prevent or Manage Infection  Recent Flowsheet Documentation  Taken 01/01/2023 1600 by Havery Moros, RN  Isolation Precautions: protective precautions maintained  Taken 01/01/2023 1400 by Havery Moros, RN  Isolation Precautions: protective precautions maintained  Taken 01/01/2023 1200 by Havery Moros, RN  Isolation Precautions: protective precautions maintained  Taken 01/01/2023 1001 by Havery Moros, RN  Isolation Precautions: protective precautions maintained  Taken 01/01/2023 0800 by Havery Moros, RN  Isolation Precautions: protective precautions maintained  Goal: Improved Oral Intake  Outcome: Ongoing - Unchanged  Goal: Optimal Pain Control and Function  Outcome: Ongoing - Unchanged  Goal: Skin Health and Integrity  Outcome: Ongoing - Unchanged  Goal: Optimal Wound Healing  Outcome: Ongoing - Unchanged

## 2023-01-02 NOTE — Unmapped (Signed)
Pt is A/O x 4, is independent with ADLs, family member at bedside, pt's BG was elevated this shift, MD notified and insulin administered as ordered, pt will need diabetic education re enforcement, chemo medication brought from home by mom, pharmacy aware, certified chemo nurse to release orders so pt can have it, rested well this shift, no concerns at this time, call bell is within reach, will continue to monitor.   Problem: Adult Inpatient Plan of Care  Goal: Plan of Care Review  Outcome: Ongoing - Unchanged  Goal: Patient-Specific Goal (Individualized)  Outcome: Ongoing - Unchanged  Goal: Absence of Hospital-Acquired Illness or Injury  Outcome: Ongoing - Unchanged  Intervention: Identify and Manage Fall Risk  Recent Flowsheet Documentation  Taken 01/02/2023 0400 by Oletta Cohn, RN  Safety Interventions:   fall reduction program maintained   lighting adjusted for tasks/safety   low bed  Taken 01/02/2023 0200 by Oletta Cohn, RN  Safety Interventions:   fall reduction program maintained   family at bedside  Taken 01/02/2023 0000 by Oletta Cohn, RN  Safety Interventions:   lighting adjusted for tasks/safety   low bed   fall reduction program maintained   family at bedside  Taken 01/01/2023 2200 by Oletta Cohn, RN  Safety Interventions:   fall reduction program maintained   lighting adjusted for tasks/safety   low bed   family at bedside  Taken 01/01/2023 2000 by Oletta Cohn, RN  Safety Interventions:   fall reduction program maintained   family at bedside   lighting adjusted for tasks/safety   low bed  Intervention: Prevent and Manage VTE (Venous Thromboembolism) Risk  Recent Flowsheet Documentation  Taken 01/01/2023 2100 by Oletta Cohn, RN  VTE Prevention/Management: anticoagulant therapy  Anti-Embolism Intervention: (Lovenox) Other (Comment)  Goal: Optimal Comfort and Wellbeing  Outcome: Ongoing - Unchanged  Goal: Readiness for Transition of Care  Outcome: Ongoing - Unchanged  Goal: Rounds/Family Conference  Outcome: Ongoing - Unchanged     Problem: Adult Inpatient Plan of Care  Goal: Plan of Care Review  Outcome: Ongoing - Unchanged     Problem: Adult Inpatient Plan of Care  Goal: Patient-Specific Goal (Individualized)  Outcome: Ongoing - Unchanged     Problem: Adult Inpatient Plan of Care  Goal: Absence of Hospital-Acquired Illness or Injury  Outcome: Ongoing - Unchanged  Intervention: Identify and Manage Fall Risk  Recent Flowsheet Documentation  Taken 01/02/2023 0400 by Oletta Cohn, RN  Safety Interventions:   fall reduction program maintained   lighting adjusted for tasks/safety   low bed  Taken 01/02/2023 0200 by Oletta Cohn, RN  Safety Interventions:   fall reduction program maintained   family at bedside  Taken 01/02/2023 0000 by Oletta Cohn, RN  Safety Interventions:   lighting adjusted for tasks/safety   low bed   fall reduction program maintained   family at bedside  Taken 01/01/2023 2200 by Oletta Cohn, RN  Safety Interventions:   fall reduction program maintained   lighting adjusted for tasks/safety   low bed   family at bedside  Taken 01/01/2023 2000 by Oletta Cohn, RN  Safety Interventions:   fall reduction program maintained   family at bedside   lighting adjusted for tasks/safety   low bed  Intervention: Prevent and Manage VTE (Venous Thromboembolism) Risk  Recent Flowsheet Documentation  Taken 01/01/2023 2100 by Oletta Cohn, RN  VTE Prevention/Management: anticoagulant therapy  Anti-Embolism Intervention: (Lovenox) Other (Comment)     Problem: Adult Inpatient Plan of Care  Goal: Absence of Hospital-Acquired  Illness or Injury  Intervention: Identify and Manage Fall Risk  Recent Flowsheet Documentation  Taken 01/02/2023 0400 by Oletta Cohn, RN  Safety Interventions:   fall reduction program maintained   lighting adjusted for tasks/safety   low bed  Taken 01/02/2023 0200 by Oletta Cohn, RN  Safety Interventions:   fall reduction program maintained   family at bedside  Taken 01/02/2023 0000 by Oletta Cohn, RN  Safety Interventions:   lighting adjusted for tasks/safety   low bed   fall reduction program maintained   family at bedside  Taken 01/01/2023 2200 by Oletta Cohn, RN  Safety Interventions:   fall reduction program maintained   lighting adjusted for tasks/safety   low bed   family at bedside  Taken 01/01/2023 2000 by Oletta Cohn, RN  Safety Interventions:   fall reduction program maintained   family at bedside   lighting adjusted for tasks/safety   low bed
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
## 2023-01-03 LAB — COMPREHENSIVE METABOLIC PANEL
ALBUMIN: 4 g/dL (ref 3.4–5.0)
ALKALINE PHOSPHATASE: 183 U/L — ABNORMAL HIGH (ref 46–116)
ALT (SGPT): 11 U/L (ref 10–49)
ANION GAP: 9 mmol/L (ref 5–14)
AST (SGOT): 18 U/L (ref <=34)
BILIRUBIN TOTAL: 0.5 mg/dL (ref 0.3–1.2)
BLOOD UREA NITROGEN: 29 mg/dL — ABNORMAL HIGH (ref 9–23)
BUN / CREAT RATIO: 22
CALCIUM: 9.5 mg/dL (ref 8.7–10.4)
CHLORIDE: 99 mmol/L (ref 98–107)
CO2: 24.1 mmol/L (ref 20.0–31.0)
CREATININE: 1.34 mg/dL — ABNORMAL HIGH
EGFR CKD-EPI (2021) FEMALE: 54 mL/min/{1.73_m2} — ABNORMAL LOW (ref >=60)
GLUCOSE RANDOM: 420 mg/dL (ref 70–179)
POTASSIUM: 4.4 mmol/L (ref 3.4–4.8)
PROTEIN TOTAL: 7.8 g/dL (ref 5.7–8.2)
SODIUM: 132 mmol/L — ABNORMAL LOW (ref 135–145)

## 2023-01-03 LAB — CBC
HEMATOCRIT: 39 % (ref 34.0–44.0)
HEMOGLOBIN: 12.9 g/dL (ref 11.3–14.9)
MEAN CORPUSCULAR HEMOGLOBIN CONC: 32.9 g/dL (ref 32.0–36.0)
MEAN CORPUSCULAR HEMOGLOBIN: 27.4 pg (ref 25.9–32.4)
MEAN CORPUSCULAR VOLUME: 83.2 fL (ref 77.6–95.7)
MEAN PLATELET VOLUME: 8.7 fL (ref 6.8–10.7)
PLATELET COUNT: 259 10*9/L (ref 150–450)
RED BLOOD CELL COUNT: 4.69 10*12/L (ref 3.95–5.13)
RED CELL DISTRIBUTION WIDTH: 14.9 % (ref 12.2–15.2)
WBC ADJUSTED: 9 10*9/L (ref 3.6–11.2)

## 2023-01-03 LAB — MAGNESIUM: MAGNESIUM: 2.2 mg/dL (ref 1.6–2.6)

## 2023-01-03 MED ADMIN — ziprasidone (GEODON) capsule 80 mg: 80 mg | ORAL | @ 16:00:00

## 2023-01-03 MED ADMIN — norethindrone (AYGESTIN) tablet 5 mg: 5 mg | ORAL | @ 18:00:00

## 2023-01-03 MED ADMIN — insulin lispro (HumaLOG) injection 0-20 Units: 0-20 [IU] | SUBCUTANEOUS | @ 22:00:00

## 2023-01-03 MED ADMIN — ziprasidone (GEODON) capsule 80 mg: 80 mg | ORAL | @ 01:00:00

## 2023-01-03 MED ADMIN — insulin glargine (LANTUS) injection: 100 [IU] | SUBCUTANEOUS | @ 16:00:00

## 2023-01-03 MED ADMIN — pregabalin (LYRICA) capsule 75 mg: 75 mg | ORAL | @ 15:00:00

## 2023-01-03 MED ADMIN — traZODone (DESYREL) tablet 200 mg: 200 mg | ORAL | @ 02:00:00

## 2023-01-03 MED ADMIN — pregabalin (LYRICA) capsule 150 mg: 150 mg | ORAL | @ 02:00:00

## 2023-01-03 MED ADMIN — insulin lispro (HumaLOG) injection 18 Units: 18 [IU] | SUBCUTANEOUS | @ 21:00:00

## 2023-01-03 MED ADMIN — ferrous sulfate tablet 325 mg: 325 mg | ORAL | @ 13:00:00

## 2023-01-03 MED ADMIN — insulin lispro (HumaLOG) injection 12 Units: 12 [IU] | SUBCUTANEOUS

## 2023-01-03 MED ADMIN — lamoTRIgine (LaMICtal) tablet 150 mg: 150 mg | ORAL | @ 02:00:00

## 2023-01-03 MED ADMIN — insulin lispro (HumaLOG) injection 18 Units: 18 [IU] | SUBCUTANEOUS | @ 16:00:00 | Stop: 2023-01-03

## 2023-01-03 MED ADMIN — pantoprazole (Protonix) EC tablet 40 mg: 40 mg | ORAL | @ 13:00:00

## 2023-01-03 MED ADMIN — insulin lispro (HumaLOG) injection 0-20 Units: 0-20 [IU] | SUBCUTANEOUS | @ 18:00:00

## 2023-01-03 MED ADMIN — atenolol (TENORMIN) tablet 100 mg: 100 mg | ORAL | @ 15:00:00

## 2023-01-03 MED ADMIN — insulin lispro (HumaLOG) injection 0-20 Units: 0-20 [IU] | SUBCUTANEOUS | @ 13:00:00

## 2023-01-03 MED ADMIN — divalproex ER (DEPAKOTE ER) extended released 24 hr tablet 500 mg: 500 mg | ORAL | @ 02:00:00

## 2023-01-03 MED ADMIN — fluticasone furoate-vilanterol (BREO ELLIPTA) 200-25 mcg/dose inhaler 1 puff: 1 | RESPIRATORY_TRACT | @ 14:00:00 | Stop: 2023-01-03

## 2023-01-03 MED ADMIN — lamoTRIgine (LaMICtal) tablet 150 mg: 150 mg | ORAL | @ 15:00:00

## 2023-01-03 MED ADMIN — torsemide (DEMADEX) tablet 20 mg: 20 mg | ORAL | @ 15:00:00

## 2023-01-03 MED ADMIN — valsartan (DIOVAN) tablet 160 mg: 160 mg | ORAL | @ 22:00:00

## 2023-01-03 MED ADMIN — insulin lispro (HumaLOG) injection 0-20 Units: 0-20 [IU] | SUBCUTANEOUS | @ 04:00:00

## 2023-01-03 MED ADMIN — insulin glargine (LANTUS) injection: 80 [IU] | SUBCUTANEOUS | @ 03:00:00

## 2023-01-03 NOTE — Unmapped (Signed)
Patient is alert and oriented x 4. Primary oncology doctor was contacted and told the docs here to not give Bosutinib her chemo med for the time being. Patients mom asked if I could give her back the medication, so I removed it from her patient specific and gave it to her. Patient was concerned she was getting too much insulin, and refused the 12 units that she is scheduled to get with meals for her lunch. Patient did not have lunch until 1530 today. She had insulin due at 1630, I checked her BG and it was 511. Doc was at bedside talking with patient and mom, I made him aware of the BG and he told me to give the 9 units of insulin. Meds given see MAR. Patient is sitting in the chair in her room. Call bell is within reach, will continue with plan of care.    Problem: Adult Inpatient Plan of Care  Goal: Plan of Care Review  Outcome: Ongoing - Unchanged  Goal: Patient-Specific Goal (Individualized)  Outcome: Ongoing - Unchanged  Goal: Absence of Hospital-Acquired Illness or Injury  Outcome: Ongoing - Unchanged  Intervention: Identify and Manage Fall Risk  Recent Flowsheet Documentation  Taken 01/02/2023 1600 by Havery Moros, RN  Safety Interventions:   fall reduction program maintained   family at bedside   neutropenic precautions  Taken 01/02/2023 1400 by Havery Moros, RN  Safety Interventions:   fall reduction program maintained   neutropenic precautions  Taken 01/02/2023 1200 by Havery Moros, RN  Safety Interventions:   fall reduction program maintained   neutropenic precautions  Taken 01/02/2023 1000 by Havery Moros, RN  Safety Interventions:   fall reduction program maintained   family at bedside   neutropenic precautions  Taken 01/02/2023 0800 by Havery Moros, RN  Safety Interventions:   fall reduction program maintained   family at bedside   neutropenic precautions  Intervention: Prevent and Manage VTE (Venous Thromboembolism) Risk  Recent Flowsheet Documentation  Taken 01/02/2023 0825 by Havery Moros, RN  VTE Prevention/Management:   ambulation promoted   anticoagulant therapy  Anti-Embolism Intervention: (lovenox) Other (Comment)  Goal: Optimal Comfort and Wellbeing  Outcome: Ongoing - Unchanged  Goal: Readiness for Transition of Care  Outcome: Ongoing - Unchanged  Goal: Rounds/Family Conference  Outcome: Ongoing - Unchanged     Problem: Wound  Goal: Optimal Coping  Outcome: Ongoing - Unchanged  Goal: Optimal Functional Ability  Outcome: Ongoing - Unchanged  Goal: Absence of Infection Signs and Symptoms  Outcome: Ongoing - Unchanged  Intervention: Prevent or Manage Infection  Recent Flowsheet Documentation  Taken 01/02/2023 1600 by Havery Moros, RN  Isolation Precautions: protective precautions maintained  Taken 01/02/2023 1400 by Havery Moros, RN  Isolation Precautions: protective precautions maintained  Taken 01/02/2023 1200 by Havery Moros, RN  Isolation Precautions: protective precautions maintained  Taken 01/02/2023 1000 by Havery Moros, RN  Isolation Precautions: protective precautions maintained  Taken 01/02/2023 0800 by Havery Moros, RN  Isolation Precautions: protective precautions maintained  Goal: Improved Oral Intake  Outcome: Ongoing - Unchanged  Goal: Optimal Pain Control and Function  Outcome: Ongoing - Unchanged  Goal: Skin Health and Integrity  Outcome: Ongoing - Unchanged  Goal: Optimal Wound Healing  Outcome: Ongoing - Unchanged

## 2023-01-03 NOTE — Unmapped (Incomplete)
Patient A&O x4, Sugar level elevated 511, HCP notified, all insulin dosage given.  Pt rested during this shift.  Her mom visiting.  Pt had a BM after 2 days of not having a BM.  Abdomen still rounded and soft d/t water retention.  Pt remained free from fall.    Problem: Adult Inpatient Plan of Care  Goal: Plan of Care Review  Outcome: Progressing  Flowsheets (Taken 01/03/2023 0112)  Progress: improving  Goal: Patient-Specific Goal (Individualized)  Outcome: Progressing  Goal: Absence of Hospital-Acquired Illness or Injury  Outcome: Progressing  Intervention: Identify and Manage Fall Risk  Recent Flowsheet Documentation  Taken 01/03/2023 0000 by Lance Muss, RN  Safety Interventions: fall reduction program maintained  Taken 01/02/2023 2200 by Lance Muss, RN  Safety Interventions: fall reduction program maintained  Taken 01/02/2023 2000 by Lance Muss, RN  Safety Interventions: fall reduction program maintained  Intervention: Prevent and Manage VTE (Venous Thromboembolism) Risk  Recent Flowsheet Documentation  Taken 01/02/2023 2055 by Lance Muss, RN  VTE Prevention/Management:   ambulation promoted   anticoagulant therapy  Intervention: Prevent Infection  Recent Flowsheet Documentation  Taken 01/03/2023 0000 by Lance Muss, RN  Infection Prevention: hand hygiene promoted  Taken 01/02/2023 2200 by Lance Muss, RN  Infection Prevention: hand hygiene promoted  Taken 01/02/2023 2000 by Lance Muss, RN  Infection Prevention: hand hygiene promoted  Goal: Optimal Comfort and Wellbeing  Outcome: Progressing  Goal: Readiness for Transition of Care  Outcome: Progressing  Goal: Rounds/Family Conference  Outcome: Progressing

## 2023-01-03 NOTE — Unmapped (Signed)
Physician Discharge Summary [facility]  1 [facility]  [address]  [place] [address]  Dept: [phone]  Loc: [phone]     Identifying Information:   Jenna Mosley  1991-04-26  [id number]    Primary Care Physician: Harlow Mares, MD   Code Status: Full Code    Admit Date: 12/31/2022    Discharge Date: 01/04/2023     Discharge To: Home    Discharge Service: [facility] - FAM [place] [facility]     Discharge Attending Physician: Orpah Melter, DO    Discharge Diagnoses:  Principal Problem:    AKI (acute kidney injury) (CMS-HCC)  Active Problems:    Essential (primary) hypertension    Shortness of breath    Bipolar I disorder: With psychotic features, Current or most recent episode depressed, with mixed features (CMS-HCC)    PTSD (post-traumatic stress disorder)    Type 2 diabetes mellitus with diabetic neuropathy, with long-term current use of insulin (CMS-HCC)    [name] (chronic myelocytic leukemia) (CMS-HCC)    Fatty liver  Resolved Problems:    * No resolved hospital problems. *      Outpatient Provider Follow Up Issues:   - Patient needs repeat sleep study, assist patient with scheduling  - Discharged on 20mg  of torsemide due to slightly low Na+, would recommend rechecking BMP in follow-up and if patient still fluid overloaded and Na+ within normal limits, consider increasing torsemide dose  - Ensure patient is set up with pulmonary rehab    Hospital Course:   Jenna Mosley is a 32 y.o. female with a past medical history significant for [name], DM, Restrictive lung disease, NAFLD who was admitted with AKI.     # AKI  Cr of 1.93 on admission with Cr 1.33 the day prior. Cr appears to be steadily increasing over the past few months. Recent increase in home PO lasix from 20mg  daily to 60mg  daily in setting of concern for fluid overload. AKI thought to be in the setting of cardiorenal syndrome given sx orthopnea and 10lb wt gain. Diuresed patient during admission initially with IV lasix, then torsemide with improvement in creatinine.  Discharged on 20mg  of torsemide.     # Hyperkalemia  K of 5 on admission, most likely due to hyperglycemia. Improved during hospital stay.     # Shortness of Breath - Hx Restrictive Lung Disease  Chronic, worsening over the past few months. Concern for fluid overload given recent weight gain in her abdomen, recently increased PO lasix. Followed by Pulmonology. Has had PFTs previously, Pulm planning for repeat at next visit. 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 No asthma diagnosis. Previous sleep studies not consistent with [name], referral has been placed for updated sleep study per patient. Echo done 12/31/22 with normal EF, only significant for mildly elevated right atrial pressure in the setting of mild Pulmonary HTN. Most likely multifactorial due to OHS, deconditioning, volume overload. Outpatient pulmonary follow up and rehab planned. Held home symbicort and gave breo.  Remained on room air during admission.     # [name] with diabetic neuropathy  Follows with [facility]. Last HbA1c A1c 8.2% on 12/03/22. Neuropathy below level of mid-shins. Current regimen includes metformin 1000 mg bid, mounjaro, Humulin R U500 110 units every morning and 100 every evening. Administered glargine and lispro during admission at very high doses with continued poor glucose control.      # Bipolar disorder - PTSD  - Continued home meds hydroxyzine, lamictal,  depakote, trazodone, geodon, lyrica.      # [name]  Follows with Oncology, bosutinib recently discontinued prior to admission.  Initially ordered on admission, then discontinued when oncology notes reviewed.     # Hx NAFLD  Alk phos elevated, similar to prior. CT CAP during ED visit 02/06/21 showed variably enhancing liver lesions, most consistent with hemangioma, follow-up MRI abdomen 02/26/21 showed multisegmental solid hepatic neoplasia, favoring FNH and hepatic adenomatosis in setting of NAFLD. 12/14/21 CTAP noted normal liver contour, no focal liver lesions.     # HTN  Discontinued home hydrochlorothiazide (due to also starting lasix then torsemide). Continued atenolol, initially held valsartan in setting of AKI, then restarted prior to discharge.         Touchbase with Outpatient Provider:  Warm Handoff: Completed on 01/04/23 by Charlotta Newton, MD  (Intern) via [facility] Message    Procedures:  None  No admission procedures for hospital encounter.  ______________________________________________________________________  Discharge Medications:     Your Medication List        STOP taking these medications      BOSULIF 500 mg Tab  Generic drug: bosutinib     furosemide 20 MG tablet  Commonly known as: LASIX     hydroCHLOROthiazide 25 MG tablet  Commonly known as: HYDRODIURIL            START taking these medications      torsemide 20 MG tablet  Commonly known as: DEMADEX  Take 1 tablet (20 mg total) by mouth daily.            CONTINUE taking these medications      ACCU-CHEK GUIDE TEST STRIPS Strp  Generic drug: blood sugar diagnostic  Check sugars before meals three times for insulin dependent type two diabetes.     acetaminophen 500 MG tablet  Commonly known as: TYLENOL  Take 2 tablets (1,000 mg total) by mouth every six (6) hours as needed for pain. Extra strength tylenol     atenolol 100 MG tablet  Commonly known as: TENORMIN  TAKE 1 TABLET BY MOUTH IN THE MORNING.     blood-glucose meter kit  Use as instructed - pt prefers a larger monitor glucometer is available     budesonide-formoterol 80-4.5 mcg/actuation inhaler  Commonly known as: SYMBICORT  Inhale 1 puff two (2) times a day.     calcium carbonate-vitamin D3 600 mg-20 mcg (800 unit) Tab  Take 1 mg by mouth Two (2) times a day (at 8am and 12:00).     cetirizine 10 MG tablet  Commonly known as: ZYRTEC  TAKE 1 TABLET BY MOUTH IN THE MORNING.     clonazePAM 0.5 MG tablet  Commonly known as: KlonoPIN  Take 1 tablet (0.5 mg total) by mouth daily as needed. PRN cyclobenzaprine 5 MG tablet  Commonly known as: FLEXERIL  Take 1 tablet (5 mg total) by mouth Three (3) times a day as needed (lower back pain).     DEXCOM G6 RECEIVER Misc  Generic drug: blood-glucose meter,continuous  Dispense DexCom G6 Receiver     DEXCOM G6 SENSOR Devi  Generic drug: blood-glucose sensor  USE 1 SENSOR EVERY 10 DAYS AS DIRECTED     DEXCOM G6 TRANSMITTER Devi  Generic drug: blood-glucose transmitter  DexCom G6 transmitter; Use 1 every 90 days     diclofenac sodium 1 % gel  Commonly known as: VOLTAREN  Apply 4 g topically four (4) times a day.     divalproex ER 500  MG extended released 24 hr tablet  Commonly known as: DEPAKOTE ER  TAKE 1 TABLET BY MOUTH AT BEDTIME     ferrous sulfate 325 (65 FE) MG tablet  Take 1 tablet (325 mg total) by mouth in the morning.     HumuLIN R U-500 (Conc) Kwikpen 500 unit/mL (3 mL) CONCENTRATED injection  Generic drug: insulin regular hum U-500 conc  Inject 130 Units under the skin Two (2) times a day (30 minutes before a meal).     hydrOXYzine 50 MG capsule  Commonly known as: VISTARIL  Take 2 capsules (100 mg total) by mouth nightly. And takes PRN     lamoTRIgine 150 MG tablet  Commonly known as: LaMICtal  Take 1 tablet (150 mg total) by mouth two (2) times a day.     lancets Misc  Commonly known as: ACCU-CHEK SOFTCLIX LANCETS  Check sugar three times per day before meals for insulin dependent type two diabetes.  E11.65     LUPRON DEPOT IM  Inject into the muscle.     metFORMIN 1000 MG tablet  Commonly known as: GLUCOPHAGE  TAKE ONE TABLET BY MOUTH TWICE DAILY IN THE MORNING AND IN THE EVENING. TAKE WITH MEALS     MOUNJARO 2.5 mg/0.5 mL Pnij  Generic drug: tirzepatide  Inject 0.5 mL (2.5 mg total) under the skin every seven (7) days for 4 doses.     MOUNJARO 5 mg/0.5 mL Pnij  Generic drug: tirzepatide  Inject 5 mg under the skin every seven (7) days for 4 doses.  Start taking on: January 16, 2023     MOUNJARO 7.5 mg/0.5 mL Pnij  Generic drug: tirzepatide  Inject 7.5 mg under the skin every seven (7) days for 4 doses.  Start taking on: February 13, 2023     norethindrone 5 mg tablet  Commonly known as: AYGESTIN  TAKE ONE TABLET EVERY MORNING     pantoprazole 40 MG tablet  Commonly known as: Protonix  TAKE 1 TABLET BY MOUTH IN THE MORNING.     pregabalin 75 MG capsule  Commonly known as: LYRICA  TAKE 1 CAPSULE BY MOUTH IN THE MORNING AND 2 CAPSULES BY MOUTH IN THE EVENING.     traZODone 100 MG tablet  Commonly known as: DESYREL  TAKE 2 TABLETS BY MOUTH AT BEDTIME     valsartan 160 MG tablet  Commonly known as: DIOVAN  TAKE 1 TABLET BY MOUTH DAILY     ziprasidone 80 MG capsule  Commonly known as: GEODON  Take 1 capsule (80 mg total) by mouth in the morning and 1 capsule (80 mg total) in the evening. Take with meals.              Allergies:  Lisinopril, Naproxen, Aripiprazole, Fluphenazine, Metoclopramide, Prednisone, Reglan [metoclopramide hcl], Diphenhydramine hcl, Multihance [gadobenate dimeglumine], Ondansetron hcl, and Promethazine  ______________________________________________________________________  Pending Test Results (if blank, then none):      Most Recent Labs:  All lab results last 24 hours -   Recent Results (from the past 24 hour(s))   POCT Glucose    Collection Time: 01/03/23 10:54 AM   Result Value Ref Range    Glucose, POC 423 (HH) 70 - 179 mg/dL   POCT Glucose    Collection Time: 01/03/23 12:51 PM   Result Value Ref Range    Glucose, POC 490 (HH) 70 - 179 mg/dL   POCT Glucose    Collection Time: 01/03/23  3:29 PM   Result Value Ref  Range    Glucose, POC 407 (HH) 70 - 179 mg/dL   POCT Glucose    Collection Time: 01/03/23  4:44 PM   Result Value Ref Range    Glucose, POC 443 (HH) 70 - 179 mg/dL   POCT Glucose    Collection Time: 01/03/23 11:08 PM   Result Value Ref Range    Glucose, POC 569 (HH) 70 - 179 mg/dL   Magnesium Level    Collection Time: 01/04/23  7:40 AM   Result Value Ref Range    Magnesium 2.6 1.6 - 2.6 mg/dL   CBC    Collection Time: 01/04/23  7:40 AM Result Value Ref Range    WBC 9.0 3.6 - 11.2 10*9/L    RBC 4.66 3.95 - 5.13 10*12/L    HGB 12.7 11.3 - 14.9 g/dL    HCT 56.2 13.0 - 86.5 %    MCV 82.5 77.6 - 95.7 fL    MCH 27.4 25.9 - 32.4 pg    MCHC 33.2 32.0 - 36.0 g/dL    RDW 78.4 (H) 69.6 - 15.2 %    MPV 8.7 6.8 - 10.7 fL    Platelet 252 150 - 450 10*9/L   Comprehensive Metabolic Panel    Collection Time: 01/04/23  7:40 AM   Result Value Ref Range    Sodium 131 (L) 135 - 145 mmol/L    Potassium 4.5 3.4 - 4.8 mmol/L    Chloride 100 98 - 107 mmol/L    CO2 24.2 20.0 - 31.0 mmol/L    Anion Gap 7 5 - 14 mmol/L    BUN 28 (H) 9 - 23 mg/dL    Creatinine 2.95 (H) 0.55 - 1.02 mg/dL    BUN/Creatinine Ratio 23     eGFR CKD-EPI (2021) Female 60 >=60 mL/min/1.47m2    Glucose 482 (HH) 70 - 179 mg/dL    Calcium 9.2 8.7 - 28.4 mg/dL    Albumin 3.7 3.4 - 5.0 g/dL    Total Protein 7.4 5.7 - 8.2 g/dL    Total Bilirubin 0.4 0.3 - 1.2 mg/dL    AST 14 <=13 U/L    ALT 17 10 - 49 U/L    Alkaline Phosphatase 182 (H) 46 - 116 U/L       Relevant Studies/Radiology (if blank, then none):  Echocardiogram Follow Up/Limited Echo With Contrast    Result Date: 01/03/2023  Summary   1. The left ventricle is normal in size with normal wall thickness.   2. The left ventricular systolic function is normal, LVEF is visually estimated at > 55%.   3. The right ventricle is normal in size, with normal systolic function.   4. There are no significant valvular abnormalities. Left Ventricle   The left ventricle is normal in size with normal wall thickness. The left ventricular systolic function is normal, LVEF is visually estimated at > 55%. There is normal left ventricular diastolic function.     XR Chest 2 views    Result Date: 12/31/2022  FINDINGS: Lung: The lungs appear clear. Pleura: No pleural effusion or pneumothorax identified. Mediastinum: The cardiomediastinal silhouette appears similar to the prior exam. Bones: There are degenerative changes of the spine.     No radiographic evidence of acute cardiopulmonary pathology.    ______________________________________________________________________  Discharge Instructions:           Other Instructions       Discharge instructions      You were admitted to [facility] Medicine at [facility]ospital for acute kidney injury with elevated creatinine and fluid overload.    Please carefully read and follow these instructions below upon your discharge:    1) Please take your medications as prescribed and note the changes listed on your discharge. At future follow-up appointments, please be sure to take all of your medications with you so your provider can better guide your care.     2) Seek medical care with your primary care doctor or local Emergency Room or Urgent Care if you develop any changes in your mental status, worsening abdominal pain, fevers greater than 101.5, any unexplained/unrelieved shortness of breath, uncontrolled nausea and vomiting that keeps you from remaining hydrated or taking your medication, or any other concerning symptoms.     3) Please go to your follow-up appointments. Some of your follow-up appointments have been listed below. If you do not see an appointment listed below with your primary care doctor, please call your doctor's office as soon as possible to schedule an appointment to be seen within 7-10 days of discharge.     4) If you have any concerns before you are able to follow-up with your primary care doctor, you can reach [place] by calling [phone] and asking to page the family medicine resident on call.                 Follow Up instructions and Outpatient Referrals     Ambulatory referral to [facility]      Reason for Pulmonary Rehab Referral: Other    Discharge instructions          Appointments which have been scheduled for you      Jan 06, 2023  3:20 PM  (Arrive by 3:05 PM)  NEW WEIGHT MANAGEMENT with Tamala Fothergill, DO  [facility] MEDICAL WEIGHT PROGRAM [place] [facility] REGION) [address]  [facility] 1 through 4  [place] [address]  [phone]   Please check with your insurance company about prescription coverage for anti-obesity medications (AOMs) prior to your visit so that we can make the most informed decisions for your care.     Please bring your current insurance cards, including prescription coverage, to your appointment to update at registration/check-in, or scan into [facility] Chart.         Jan 08, 2023  2:20 PM  (Arrive by 2:05 PM)  RETURN CONTINUITY with Nurum Joeseph Amor, MD  [facility] INTERNAL MEDICINE WEAV[place]OSSING [place] [facility] REGION) [address]  [address]  [place] [address]  [phone]        Jan 21, 2023  1:00 PM  (Arrive by 12:45 PM)  NURSE VISIT with ONCINF HMOB LABS  [facility]COLOGY [facility] REGION) [address]  1st Floor  [place] [address]  [phone]        Mar 02, 2023  2:50 PM  (Arrive by 2:35 PM)  PFT with PFT 2  [facility] PULMONARY SPECIALTY FUNCT [place] [address]  [place] 1 through 4  [place] [address]  [phone]   If you have inhaled breathing medications, please do not use it 4 hours prior to this breathing test.  Please wear comfortable clothing and well-fitting , closed toe shoes in the even that a 6-minute walk is part of your test.  If you have fallen in the past month please inform your respiratory therapist at  the start of your appointment.    While you may bring a guest to your appointment, they will not be able to be present in the testing area.         Mar 03, 2023  2:30 PM  (Arrive by 2:15 PM)  PFT with PFT 2  [facility] PULMONARY SPECIALTY FUNCT [place]) [address]  [place] 1 through 4  [place] [address]  [phone]   If you have inhaled breathing medications, please do not use it 4 hours prior to this breathing test.  Please wear comfortable clothing and well-fitting , closed toe shoes in the even that a 6-minute walk is part of your test.  If you have fallen in the past month please inform your respiratory therapist at the start of your appointment.    While you may bring a guest to your appointment, they will not be able to be present in the testing area.         Mar 09, 2023  1:00 PM  (Arrive by 12:45 PM)  RETURN  PULMONARY with Hilda Blades, MD  [facility] PULMONARY SPECIALTY CL [place] [facility] REGION) [address]  [facility] 1 through 4  [place] [address]  [phone]        Mar 25, 2023  1:30 PM  (Arrive by 1:15 PM)  RETURN  DIABETES with Lindalou Hose Dulcy Fanny, MD  [facility] DIABETES AND ENDOCRINOLOGY [place] [facility] REGION) [address]  [facility] 1 through 4  [place] [address]  [phone]        Mar 31, 2023  1:00 PM  (Arrive by 12:30 PM)  LAB ONLY [place] with ADULT ONC LAB  [facility] ADULT ONCOLOGY LAB [place] [facility] REGION) [address]  [place] [address]  [phone]        Mar 31, 2023  2:00 PM  (Arrive by 1:30 PM)  RETURN ACTIVE [place] with Haig Prophet, [place]  [facility] HEMATOLOGY ONCOLOGY 2ND FLR [facility] REGION) [address]  [place] [address]  [phone]        Apr 21, 2023  1:00 PM  NEW OPTOMETRY with Ali Lowe [place], OD   [address]MOLOGY [address]  [facility] REGION) [address]  [address]  [place] [address]  [phone]        Apr 23, 2023  1:00 PM  (Arrive by 12:45 PM)  NURSE VISIT with ONCINF HMOB LABS  [facility] ONCOLOGY [facility] [place] [facility] REGION) [address]  1st Floor  [place] [address]  [phone]             ______________________________________________________________________  Discharge Day Services:  BP 111/68  - Pulse 64  - Temp 36.4 ??C (97.5 ??F) (Oral)  - Resp 18  - Ht 182.9 cm (6')  - Wt (!) 167.3 kg (368 lb 12.8 oz)  - SpO2 97%  - BMI 50.02 kg/m??   Pt seen on the day of discharge and determined appropriate for discharge.    GEN: well appearing, lying in bed, NAD   HEENT: NCAT, No scleral icterus. Conjunctiva non-erythematous. MMM.  CV: Regular rate and rhythm. No murmurs/rubs/gallops.  Pulm: Normal work of breathing on RA. CTAB. No wheezing, crackles, or rhonchi.  Abd: Flat.  Nontender. No guarding, rebound.  Normoactive bowel sounds.    Neuro:  A&O x 3. No focal deficits.  Ext: No peripheral edema.  Palpable distal pulses.  Skin: No rashes or skin lesions.     Condition at Discharge: good    Length of Discharge: I spent greater than 30 mins in the discharge of this patient.    Sherrill Raring, MD  [facility], PGY 1  01/04/23 9:39 AM

## 2023-01-03 NOTE — Unmapped (Signed)
Family Medicine Inpatient Service  Progress Note    Team: [facility] (pgr [phone])    Hospital Day: 1    ASSESSMENT / PLAN:   Jenna Mosley is a 32 y.o. female with a past medical history significant for [name], DM, Restrictive lung disease, NAFLD who presents with AKI.     # AKI  Cr of 1.93 on admission with Cr 1.33 the day prior. Cr appears to be steadily increasing over the past few months. Recent increase in home PO lasix from 20mg  daily to 60mg  daily in setting of concern for fluid overload. Cr 1.34 today, AKI likely in the setting of cardiorenal syndrome given sx orthopnea and 10lb wt gain. Will continue with diuresis as patient has had appropriate output response. S/p IV lasix 80 yesterday afternoon, transition to oral torsemide 20 today.   - Daily CMP  - torsemide 20 mg  - daily wt      # Hyperkalemia  K of 5 on admission, most likely due to hyperglycemia.   - Daily CMP  - 4.4 today     # Shortness of Breath - Hx Restrictive Lung Disease  Chronic, worsening over the past few months. Concern for fluid overload given recent weight gain in her abdomen, recently increased PO lasix. Followed by Pulmonology. Has had PFTs previously, Pulm planning for repeat at next visit. 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 No asthma diagnosis. Previous sleep studies not consistent with [name], referral has been placed for updated sleep study per patient. Echo done 12/31/22 with normal EF, only significant for mildly elevated right atrial pressure in the setting of mild Pulmonary HTN. Most likely multifactorial due to OHS, deconditioning, volume overload 2/2 Pulm HTN.   - CMP  - Outpatient Pulm follow up, pulmonary rehab outpatient  - plan for repeat sleep study outpatient  - held home Symbicort switch to Breo, prn albuterol     # [name] with diabetic neuropathy  Follows with [facility]. Last HbA1c A1c 8.2% on 12/03/22. Neuropathy below level of mid-shins. Current regimen includes metformin 1000 mg bid, mounjaro, Humulin R U500 110 units every morning and 100 every evening. Endocrinology is not present at [facility] to re dose Humulin U500.   - POC glucose checks, continue to be > 400  - 80u Lantus BID -> inc to 100   - meal time 12u lispro -> inc to 18u  - SSI     # Bipolar disorder - PTSD  - Continue home meds hydroxyzine, lamictal, depakote, trazodone, geodon, lyrica.      # [name]  Follows with Oncology, in MMR on Bosutinib.  - Hold home Bosutinib per heme recs given concern for contribution to volume overload     # Hx NAFLD  Alk phos elevated, similar to prior. CT CAP during ED visit 02/06/21 showed variably enhancing liver lesions, most consistent with hemangioma, follow-up MRI abdomen 02/26/21 showed multisegmental solid hepatic neoplasia, favoring FNH and hepatic adenomatosis in setting of NAFLD.   - Outpatient follow up  - will require MRI w/ Enovi in the future per her primary hepatologist  - daily CMP      # HTN  - discontinue home hydrochlorothiazide, continue atenolol  - Hold valsartan in setting of AKI       # Checklist:  - IVF None  - Daily labs needed: CBC, BMP, and Magnesium  - Diet Regular  - Bowel Regimen: Miralax 17 g daily and Senna 2 tabs qHS  - DVT:  SQ Lovenox  - Code Status:   Orders Placed This Encounter   Procedures    Full Code     Standing Status:   Standing     Number of Occurrences:   1     - Dispo: Floor    [ ]  Anticipated Discharge Location: Home  [ ]  PT/OT/DME: No needs anticipated  [ ]  CM/SW needs: None anticipated  [ ]  Follow up appt: Appointment needed    SUBJECTIVE:  Interval events: NEON. Net neg 1L past 24 hours.  Breathing feels great today.  Interested in discharge tomorrow.    REVIEW OF SYSTEMS:  Pertinent positives and negatives per HPI. A complete review of systems otherwise negative.    PHYSICAL EXAM:    No intake or output data in the 24 hours ending 01/03/23 0602      Recent Vitals:  Vitals:    01/02/23 2016   BP: 146/75   Pulse: 65   Resp: 18   Temp: 36.7 ??C (98.1 ??F)   SpO2: 96%       GEN: well appearing, lying in bed, NAD   HEENT: NCAT, No scleral icterus. Conjunctiva non-erythematous. MMM.  CV: Regular rate and rhythm. No murmurs/rubs/gallops.  Pulm: Normal work of breathing on RA. CTAB. No wheezing, crackles, or rhonchi.  Abd: Flat.  Nontender. No guarding, rebound.  Normoactive bowel sounds.    Neuro: A&O x 3. No focal deficits.  Ext: No peripheral edema.  Palpable distal pulses.  Skin: No rashes or skin lesions.     LABS/ STUDIES:    All imaging, laboratory studies, and other pertinent tests including electrocardiography within the last 24 hours were reviewed and are summarized within the assessment and plan.     NUTRITION:                         Charlotta Newton, MD, PGY-1  January 01, 2023 7:54 AM

## 2023-01-04 LAB — CBC
HEMATOCRIT: 38.4 % (ref 34.0–44.0)
HEMOGLOBIN: 12.7 g/dL (ref 11.3–14.9)
MEAN CORPUSCULAR HEMOGLOBIN CONC: 33.2 g/dL (ref 32.0–36.0)
MEAN CORPUSCULAR HEMOGLOBIN: 27.4 pg (ref 25.9–32.4)
MEAN CORPUSCULAR VOLUME: 82.5 fL (ref 77.6–95.7)
MEAN PLATELET VOLUME: 8.7 fL (ref 6.8–10.7)
PLATELET COUNT: 252 10*9/L (ref 150–450)
RED BLOOD CELL COUNT: 4.66 10*12/L (ref 3.95–5.13)
RED CELL DISTRIBUTION WIDTH: 15.3 % — ABNORMAL HIGH (ref 12.2–15.2)
WBC ADJUSTED: 9 10*9/L (ref 3.6–11.2)

## 2023-01-04 LAB — COMPREHENSIVE METABOLIC PANEL
ALBUMIN: 3.7 g/dL (ref 3.4–5.0)
ALKALINE PHOSPHATASE: 182 U/L — ABNORMAL HIGH (ref 46–116)
ALT (SGPT): 17 U/L (ref 10–49)
ANION GAP: 7 mmol/L (ref 5–14)
AST (SGOT): 14 U/L (ref <=34)
BILIRUBIN TOTAL: 0.4 mg/dL (ref 0.3–1.2)
BLOOD UREA NITROGEN: 28 mg/dL — ABNORMAL HIGH (ref 9–23)
BUN / CREAT RATIO: 23
CALCIUM: 9.2 mg/dL (ref 8.7–10.4)
CHLORIDE: 100 mmol/L (ref 98–107)
CO2: 24.2 mmol/L (ref 20.0–31.0)
CREATININE: 1.22 mg/dL — ABNORMAL HIGH
EGFR CKD-EPI (2021) FEMALE: 60 mL/min/{1.73_m2} (ref >=60)
GLUCOSE RANDOM: 482 mg/dL (ref 70–179)
POTASSIUM: 4.5 mmol/L (ref 3.4–4.8)
PROTEIN TOTAL: 7.4 g/dL (ref 5.7–8.2)
SODIUM: 131 mmol/L — ABNORMAL LOW (ref 135–145)

## 2023-01-04 LAB — MAGNESIUM: MAGNESIUM: 2.6 mg/dL (ref 1.6–2.6)

## 2023-01-04 MED ORDER — TORSEMIDE 20 MG TABLET
ORAL_TABLET | Freq: Every day | ORAL | 0 refills | 30 days | Status: CP
Start: 2023-01-04 — End: 2023-02-03

## 2023-01-04 MED ORDER — VALSARTAN 160 MG TABLET
ORAL_TABLET | 3 refills | 0 days | Status: CP
Start: 2023-01-04 — End: ?

## 2023-01-04 MED ORDER — NORETHINDRONE ACETATE 5 MG TABLET
ORAL_TABLET | 11 refills | 0 days | Status: CP
Start: 2023-01-04 — End: ?

## 2023-01-04 MED ADMIN — atenolol (TENORMIN) tablet 100 mg: 100 mg | ORAL | @ 14:00:00 | Stop: 2023-01-04

## 2023-01-04 MED ADMIN — insulin glargine (LANTUS) injection: 100 [IU] | SUBCUTANEOUS | @ 15:00:00 | Stop: 2023-01-04

## 2023-01-04 MED ADMIN — lamoTRIgine (LaMICtal) tablet 150 mg: 150 mg | ORAL | @ 14:00:00 | Stop: 2023-01-04

## 2023-01-04 MED ADMIN — pregabalin (LYRICA) capsule 75 mg: 75 mg | ORAL | @ 14:00:00 | Stop: 2023-01-04

## 2023-01-04 MED ADMIN — ferrous sulfate tablet 325 mg: 325 mg | ORAL | @ 14:00:00 | Stop: 2023-01-04

## 2023-01-04 MED ADMIN — torsemide (DEMADEX) tablet 20 mg: 20 mg | ORAL | @ 14:00:00 | Stop: 2023-01-04

## 2023-01-04 MED ADMIN — insulin lispro (HumaLOG) injection 18 Units: 18 [IU] | SUBCUTANEOUS | @ 15:00:00 | Stop: 2023-01-04

## 2023-01-04 MED ADMIN — lamoTRIgine (LaMICtal) tablet 150 mg: 150 mg | ORAL | @ 02:00:00

## 2023-01-04 MED ADMIN — insulin lispro (HumaLOG) injection 18 Units: 18 [IU] | SUBCUTANEOUS

## 2023-01-04 MED ADMIN — divalproex ER (DEPAKOTE ER) extended released 24 hr tablet 500 mg: 500 mg | ORAL | @ 02:00:00

## 2023-01-04 MED ADMIN — ziprasidone (GEODON) capsule 80 mg: 80 mg | ORAL | @ 14:00:00 | Stop: 2023-01-04

## 2023-01-04 MED ADMIN — norethindrone (AYGESTIN) tablet 5 mg: 5 mg | ORAL | @ 14:00:00 | Stop: 2023-01-04

## 2023-01-04 MED ADMIN — insulin lispro (HumaLOG) injection 0-20 Units: 0-20 [IU] | SUBCUTANEOUS | @ 15:00:00 | Stop: 2023-01-04

## 2023-01-04 MED ADMIN — insulin lispro (HumaLOG) injection 0-20 Units: 0-20 [IU] | SUBCUTANEOUS | @ 04:00:00

## 2023-01-04 MED ADMIN — pantoprazole (Protonix) EC tablet 40 mg: 40 mg | ORAL | @ 14:00:00 | Stop: 2023-01-04

## 2023-01-04 MED ADMIN — pregabalin (LYRICA) capsule 150 mg: 150 mg | ORAL | @ 02:00:00

## 2023-01-04 MED ADMIN — ziprasidone (GEODON) capsule 80 mg: 80 mg | ORAL

## 2023-01-04 MED ADMIN — valsartan (DIOVAN) tablet 160 mg: 160 mg | ORAL | @ 14:00:00 | Stop: 2023-01-04

## 2023-01-04 MED ADMIN — traZODone (DESYREL) tablet 200 mg: 200 mg | ORAL | @ 02:00:00

## 2023-01-04 NOTE — Unmapped (Addendum)
Patient is alert and oriented x 4. Meds given see MAR. Protective precautions remain in place. Patients mom was at bedside most of the shift. Patient is in her sitting in her chair resting with the call bell in reach. Will continue with plan of care.   Problem: Adult Inpatient Plan of Care  Goal: Plan of Care Review  Outcome: Ongoing - Unchanged  Goal: Patient-Specific Goal (Individualized)  Outcome: Ongoing - Unchanged  Goal: Absence of Hospital-Acquired Illness or Injury  Outcome: Ongoing - Unchanged  Intervention: Identify and Manage Fall Risk  Recent Flowsheet Documentation  Taken 01/03/2023 1400 by Havery Moros, RN  Safety Interventions: fall reduction program maintained  Taken 01/03/2023 1200 by Havery Moros, RN  Safety Interventions:   fall reduction program maintained   family at bedside  Taken 01/03/2023 1000 by Havery Moros, RN  Safety Interventions:   fall reduction program maintained   family at bedside  Taken 01/03/2023 0800 by Havery Moros, RN  Safety Interventions:   fall reduction program maintained   family at bedside   neutropenic precautions  Intervention: Prevent and Manage VTE (Venous Thromboembolism) Risk  Recent Flowsheet Documentation  Taken 01/03/2023 1500 by Havery Moros, RN  VTE Prevention/Management: anticoagulant therapy  Anti-Embolism Intervention: (lovenox) Other (Comment)  Goal: Optimal Comfort and Wellbeing  Outcome: Ongoing - Unchanged  Goal: Readiness for Transition of Care  Outcome: Ongoing - Unchanged  Goal: Rounds/Family Conference  Outcome: Ongoing - Unchanged     Problem: Wound  Goal: Optimal Coping  Outcome: Ongoing - Unchanged  Goal: Optimal Functional Ability  Outcome: Ongoing - Unchanged  Goal: Absence of Infection Signs and Symptoms  Outcome: Ongoing - Unchanged  Intervention: Prevent or Manage Infection  Recent Flowsheet Documentation  Taken 01/03/2023 1400 by Havery Moros, RN  Isolation Precautions: protective precautions maintained  Taken 01/03/2023 1200 by Havery Moros, RN  Isolation Precautions: protective precautions maintained  Taken 01/03/2023 1000 by Havery Moros, RN  Isolation Precautions: protective precautions maintained  Taken 01/03/2023 0800 by Havery Moros, RN  Isolation Precautions: protective precautions maintained  Goal: Improved Oral Intake  Outcome: Ongoing - Unchanged  Goal: Optimal Pain Control and Function  Outcome: Ongoing - Unchanged  Goal: Skin Health and Integrity  Outcome: Ongoing - Unchanged  Goal: Optimal Wound Healing  Outcome: Ongoing - Unchanged

## 2023-01-04 NOTE — Unmapped (Signed)
Patient A&Ox4, Protective precautions in place.  Blood sugar in 500.  Pt refused Lovenox.  Pt remained safe and call bell within reach.  Problem: Adult Inpatient Plan of Care  Goal: Plan of Care Review  Outcome: Progressing  Flowsheets (Taken 01/04/2023 0417)  Progress: improving  Plan of Care Reviewed With: patient  Goal: Patient-Specific Goal (Individualized)  Outcome: Progressing  Goal: Absence of Hospital-Acquired Illness or Injury  Outcome: Progressing  Intervention: Identify and Manage Fall Risk  Recent Flowsheet Documentation  Taken 01/04/2023 0400 by Lance Muss, RN  Safety Interventions: fall reduction program maintained  Taken 01/04/2023 0200 by Lance Muss, RN  Safety Interventions: fall reduction program maintained  Taken 01/04/2023 0000 by Lance Muss, RN  Safety Interventions: fall reduction program maintained  Taken 01/03/2023 2200 by Lance Muss, RN  Safety Interventions: fall reduction program maintained  Taken 01/03/2023 2000 by Lance Muss, RN  Safety Interventions: fall reduction program maintained  Intervention: Prevent and Manage VTE (Venous Thromboembolism) Risk  Recent Flowsheet Documentation  Taken 01/03/2023 2102 by Lance Muss, RN  VTE Prevention/Management:   ambulation promoted   anticoagulant therapy  Intervention: Prevent Infection  Recent Flowsheet Documentation  Taken 01/04/2023 0400 by Lance Muss, RN  Infection Prevention: hand hygiene promoted  Taken 01/04/2023 0200 by Lance Muss, RN  Infection Prevention: hand hygiene promoted  Taken 01/04/2023 0000 by Lance Muss, RN  Infection Prevention: hand hygiene promoted  Taken 01/03/2023 2200 by Lance Muss, RN  Infection Prevention: hand hygiene promoted  Taken 01/03/2023 2000 by Lance Muss, RN  Infection Prevention: hand hygiene promoted  Goal: Optimal Comfort and Wellbeing  Outcome: Progressing  Goal: Readiness for Transition of Care  Outcome: Progressing  Goal: Rounds/Family Conference  Outcome: Progressing

## 2023-01-06 ENCOUNTER — Ambulatory Visit
Admit: 2023-01-06 | Discharge: 2023-01-07 | Payer: MEDICAID | Attending: Student in an Organized Health Care Education/Training Program | Primary: Student in an Organized Health Care Education/Training Program

## 2023-01-06 DIAGNOSIS — E282 Polycystic ovarian syndrome: Principal | ICD-10-CM

## 2023-01-06 DIAGNOSIS — Z794 Long term (current) use of insulin: Principal | ICD-10-CM

## 2023-01-06 DIAGNOSIS — E1165 Type 2 diabetes mellitus with hyperglycemia: Principal | ICD-10-CM

## 2023-01-06 NOTE — Unmapped (Signed)
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 POC   Date Value Ref Range Status   03/06/2014 0.8 0.7 - 1.1 mg/dL Final     Comment:     Performed by: [facility]  [address], [place], [place] [address]       Creatinine   Date Value Ref Range Status   01/04/2023 1.22 (H) 0.55 - 1.02 mg/dL Final   66/44/0347 4.25 (H) 0.55 - 1.02 mg/dL Final   95/63/8756 4.33 (H) 0.55 - 1.02 mg/dL Final   29/51/8841 6.60 (L) 0.60 - 1.00 mg/dL Final   63/11/6008 9.32 (L) 0.60 - 1.00 mg/dL Final   35/57/3220 2.54 (L) 0.60 - 1.00 mg/dL Final   .            Visit duration:  I personally spent 55 minutes face-to-face and non-face-to-face in the care of this patient, which includes all pre, intra, and post visit time on the date of service.

## 2023-01-07 NOTE — Unmapped (Signed)
It was great seeing you in clinic today! Please see below for an overview of what we discussed:     Work on a goal of 15-30g of protein per meal. Many patients find an app to be a helpful tool to accomplish this (MyFitnessPal, LoseIt, etc).     Work on increasing your overall physical activity. Specifically focus on resistance training to goal 4x per week.   Explore the following website for instructional resources: https://[employer].com/programs/videos/    You were prescribed:  Continue Mounjaro 2.5mg  weekly, increase dose monthly as prescribed. If issues with increase dosing please notify myself or Dr. Koren Shiver.      If you have labs drawn, you will be notified through the MyChart Portal when they have been reviewed. You will be contacted to schedule appointments for any referrals placed.     If you have any questions or concerns before your next appointment, do not hesitate to reach out via the MyChart Portal.

## 2023-01-08 ENCOUNTER — Ambulatory Visit
Admit: 2023-01-08 | Discharge: 2023-01-08 | Payer: MEDICAID | Attending: Student in an Organized Health Care Education/Training Program | Primary: Student in an Organized Health Care Education/Training Program

## 2023-01-08 ENCOUNTER — Other Ambulatory Visit: Admit: 2023-01-08 | Discharge: 2023-01-08 | Payer: MEDICAID

## 2023-01-08 ENCOUNTER — Ambulatory Visit
Admit: 2023-01-08 | Discharge: 2023-01-08 | Payer: MEDICAID | Attending: Geriatric Medicine | Primary: Geriatric Medicine

## 2023-01-08 ENCOUNTER — Ambulatory Visit: Admit: 2023-01-08 | Discharge: 2023-01-08 | Payer: MEDICAID

## 2023-01-08 DIAGNOSIS — Z6841 Body Mass Index (BMI) 40.0 and over, adult: Principal | ICD-10-CM

## 2023-01-08 DIAGNOSIS — R7989 Other specified abnormal findings of blood chemistry: Principal | ICD-10-CM

## 2023-01-08 DIAGNOSIS — C9210 Chronic myeloid leukemia, BCR/ABL-positive, not having achieved remission: Principal | ICD-10-CM

## 2023-01-08 DIAGNOSIS — I272 Pulmonary hypertension, unspecified: Principal | ICD-10-CM

## 2023-01-08 DIAGNOSIS — K7581 Nonalcoholic steatohepatitis (NASH): Principal | ICD-10-CM

## 2023-01-08 LAB — COMPREHENSIVE METABOLIC PANEL
ALBUMIN: 3.6 g/dL (ref 3.4–5.0)
ALKALINE PHOSPHATASE: 186 U/L — ABNORMAL HIGH (ref 46–116)
ALT (SGPT): <7 U/L — ABNORMAL LOW (ref 10–49)
ANION GAP: 8 mmol/L (ref 5–14)
AST (SGOT): 19 U/L (ref <=34)
BILIRUBIN TOTAL: 0.2 mg/dL — ABNORMAL LOW (ref 0.3–1.2)
BLOOD UREA NITROGEN: 18 mg/dL (ref 9–23)
BUN / CREAT RATIO: 19
CALCIUM: 9.2 mg/dL (ref 8.7–10.4)
CHLORIDE: 105 mmol/L (ref 98–107)
CO2: 21 mmol/L (ref 20.0–31.0)
CREATININE: 0.94 mg/dL
EGFR CKD-EPI (2021) FEMALE: 82 mL/min/{1.73_m2} (ref >=60)
GLUCOSE RANDOM: 398 mg/dL — ABNORMAL HIGH (ref 70–179)
POTASSIUM: 4.8 mmol/L (ref 3.4–4.8)
PROTEIN TOTAL: 7.2 g/dL (ref 5.7–8.2)
SODIUM: 134 mmol/L — ABNORMAL LOW (ref 135–145)

## 2023-01-08 LAB — CBC W/ AUTO DIFF
BASOPHILS ABSOLUTE COUNT: 0.2 10*9/L — ABNORMAL HIGH (ref 0.0–0.1)
BASOPHILS RELATIVE PERCENT: 1.7 %
EOSINOPHILS ABSOLUTE COUNT: 0.3 10*9/L (ref 0.0–0.5)
EOSINOPHILS RELATIVE PERCENT: 2.7 %
HEMATOCRIT: 38.1 % (ref 34.0–44.0)
HEMOGLOBIN: 12.7 g/dL (ref 11.3–14.9)
LYMPHOCYTES ABSOLUTE COUNT: 3.6 10*9/L (ref 1.1–3.6)
LYMPHOCYTES RELATIVE PERCENT: 36.7 %
MEAN CORPUSCULAR HEMOGLOBIN CONC: 33.4 g/dL (ref 32.0–36.0)
MEAN CORPUSCULAR HEMOGLOBIN: 28 pg (ref 25.9–32.4)
MEAN CORPUSCULAR VOLUME: 83.6 fL (ref 77.6–95.7)
MEAN PLATELET VOLUME: 8.4 fL (ref 6.8–10.7)
MONOCYTES ABSOLUTE COUNT: 0.5 10*9/L (ref 0.3–0.8)
MONOCYTES RELATIVE PERCENT: 4.8 %
NEUTROPHILS ABSOLUTE COUNT: 5.3 10*9/L (ref 1.8–7.8)
NEUTROPHILS RELATIVE PERCENT: 54.1 %
PLATELET COUNT: 250 10*9/L (ref 150–450)
RED BLOOD CELL COUNT: 4.56 10*12/L (ref 3.95–5.13)
RED CELL DISTRIBUTION WIDTH: 14.8 % (ref 12.2–15.2)
WBC ADJUSTED: 9.8 10*9/L (ref 3.6–11.2)

## 2023-01-08 NOTE — Unmapped (Addendum)
We are going to continue to hold your Bosutinib today. I would like to see you back in 1 week where we can maybe resume your Bostuinib at 400 mg to see if it is contributing at all to your fluid.     I would like you to go see your primary care doctor today to help manage your fluid and weight gain, as well as your high blood sugars.     I would like you to come back in one week at 9 am next week.     If you have questions or concerns, you may call the Nurse call line at [phone].    Appointment on 01/08/2023   Component Date Value Ref Range Status    Sodium 01/08/2023 134 (L)  135 - 145 mmol/L Final    Potassium 01/08/2023 4.8  3.4 - 4.8 mmol/L Final    Chloride 01/08/2023 105  98 - 107 mmol/L Final    CO2 01/08/2023 21.0  20.0 - 31.0 mmol/L Final    Anion Gap 01/08/2023 8  5 - 14 mmol/L Final    BUN 01/08/2023 18  9 - 23 mg/dL Final    Creatinine 19/14/7829 0.94  0.55 - 1.02 mg/dL Final    BUN/Creatinine Ratio 01/08/2023 19   Final    eGFR CKD-EPI (2021) Female 01/08/2023 82  >=60 mL/min/1.51m2 Final    eGFR calculated with CKD-EPI 2021 equation in accordance with [employer] and [employer] of Nephrology Task Force recommendations.    Glucose 01/08/2023 398 (H)  70 - 179 mg/dL Final    Calcium 56/21/3086 9.2  8.7 - 10.4 mg/dL Final    Albumin 57/84/6962 3.6  3.4 - 5.0 g/dL Final    Total Protein 01/08/2023 7.2  5.7 - 8.2 g/dL Final    Total Bilirubin 01/08/2023 0.2 (L)  0.3 - 1.2 mg/dL Final    AST 95/28/4132 19  <=34 U/L Final    ALT 01/08/2023 <7 (L)  10 - 49 U/L Final    Alkaline Phosphatase 01/08/2023 186 (H)  46 - 116 U/L Final    WBC 01/08/2023 9.8  3.6 - 11.2 10*9/L Final    RBC 01/08/2023 4.56  3.95 - 5.13 10*12/L Final    HGB 01/08/2023 12.7  11.3 - 14.9 g/dL Final    HCT 44/11/270 38.1  34.0 - 44.0 % Final    MCV 01/08/2023 83.6  77.6 - 95.7 fL Final    MCH 01/08/2023 28.0  25.9 - 32.4 pg Final    MCHC 01/08/2023 33.4  32.0 - 36.0 g/dL Final    RDW 53/66/4403 14.8  12.2 - 15.2 % Final    MPV 01/08/2023 8.4  6.8 - 10.7 fL Final    Platelet 01/08/2023 250  150 - 450 10*9/L Final    Neutrophils % 01/08/2023 54.1  % Final    Lymphocytes % 01/08/2023 36.7  % Final    Monocytes % 01/08/2023 4.8  % Final    Eosinophils % 01/08/2023 2.7  % Final    Basophils % 01/08/2023 1.7  % Final    Absolute Neutrophils 01/08/2023 5.3  1.8 - 7.8 10*9/L Final    Absolute Lymphocytes 01/08/2023 3.6  1.1 - 3.6 10*9/L Final    Absolute Monocytes 01/08/2023 0.5  0.3 - 0.8 10*9/L Final    Absolute Eosinophils 01/08/2023 0.3  0.0 - 0.5 10*9/L Final    Absolute Basophils 01/08/2023 0.2 (H)  0.0 - 0.1 10*9/L Final

## 2023-01-08 NOTE — Unmapped (Addendum)
Increase torsemide to 20mg  alternating with 40mg     We have entered a referral for you today.  Please call the [facility] Radiology office at [phone] to schedule a time for this appointment.

## 2023-01-08 NOTE — Unmapped (Signed)
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     Briefly, her recent history has been complicated by fluid overload resulting in hospitalization. The etiology is unknown. 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 Patient states this has been a problem that has pre-existed even before bosutinib and does not believe it is the bosutinib. Nonetheless, her bosutinib has been held for about a week in case it is contributing to fluid and/or cardiac/pulm problems. Minimal improvement appreciated by the patient yet, continues on diuretics. Med rec not formally done, patient maintains med list in [place].    Patient describes to me her desire to have gastric bypass. She is obese, and hopes to have the option to have a child one day (getting lupron shots in hope to preserve fertility). She also feels like her weight is impacting her mobility, QOL, and causing other complications like diabetes. She is aware that gastric bypass can complicate absorption of TKIs and threaten control of [name]. I validated her desire to get the surgery, and also explained the risks related to [facility] (outlining worst case scenarios could include loss of response or mutations, blast crisis, need for intensive chemotherapy, and alloSCT). These are possibilities for all [name] patients, but inability to absorb drug could increase her risk of these events. However getting the bypass surgery theoretically could make her a better candidate if chemo/SCT ever needed. Ultimately if cleared, patient would have to personally weigh all risks/benefits of options to pursue or avoid gastric bypass and make a decision that is best for her.     She would need clearance for gastric bypass by all parties (we recommend cardiology referral), seeing PCP today. We discussed things if she hypothetically were cleared for this surgery. If she wishes to proceed, we would do our best to optimize her TKI therapy to maintain control of [name]. We would likely check BCR-ABL's monthly, minimize holds (although with her MMR holding for periods of time surrounding procedure likely reasonable), consider safe crushing with closed system devices at home, minimize acid suppression as applicable, potentially switch to TKI agent not impacted by acid (such as asciminib; did not respond to imatinib), increase doses to maximize effect based on BCR-ABLs. Learning about different options of weight loss surgery and weighing option of reversible approaches may be worth considering if recommended by surgeon.     For now, she will continue to hold bosutinib, but we will plan on restarting at current dose of 500 mg daily at follow-up (2/29). Tolerating it well overall and in MMR. She should work with PCP, pulmonology, and hopefully cardiology to further explore etiology of fluid overload and treating this with diuresis. If new dx HF or pulmHTN, we may need to consider TKI underlying contributor; asciminib would be next line. She will stay in touch regarding plans for gastric bypass consultation.       Manfred Arch, PharmD, BCOP, CPP  Pager: [phone]
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
## 2023-01-09 NOTE — Unmapped (Signed)
Carrie from [employer] called and left a [employer] message on the nurse line stating that the DNA sample volume was too low to run the test.   Ref# [id number]    Message sent to Dr. Hale Bogus to review.

## 2023-01-10 NOTE — Unmapped (Signed)
Follows with Oncology, bosutinib recently discontinued prior to admission.  Initially ordered on admission, then discontinued when oncology notes reviewed.

## 2023-01-10 NOTE — Unmapped (Signed)
Weight gain has been dramatic over the past few months.  Possible that some of this is fluid retention however likely it is weight gain.  Weight gain multifactorial including medication side effect, genetic predisposition, metabolic effects of consumption of processed food, irregular eating patterns causing circadian disruption, suboptimal physical activity, poor sleep quality, and increased life stress. Overall will benefit from medications and lifestyle modifications as below:Evaluated  by weight management in February.  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  Bariatric Surgery: Patient would like to readdress after trial of medication  Weight gain causing medications:   - Lyrica 75mg  BID  - Hydroxyzine 100mg  nightly   - Trazodone 100mg  nightly   - U-500 insulin 110/100units.      Anti Obesity Medications:   - Agree with initiation of Mounjaro 2.5mg  weekly, increased monthly. Discussed goal to reduce insulin requirements with increasing use.   - Continue Metforming 1g BID.   - Defer retrial of SGLT2i for now given reported side effect, consider at future visits.   - Avoiding future use of Bupropion given side effect concern.     #Evidence of Strong Genetic component   Strong family history and reported history of hyperphagia/obesity before the age 34.   - Genetic testing sent, reviewed limitations associated with testing.

## 2023-01-10 NOTE — Unmapped (Signed)
Bp under good control taking valsartan 160mg  daily, atenolol 100mg  daily, and torsemide 20mg  alternating with 40mg 

## 2023-01-10 NOTE — Unmapped (Signed)
Management as per endocrine.  Not demonstrating good control.  Hopeful this will improve with mounjaro titration upwards.  Current regimen includes metformin 1000 mg bid, mounjaro, Humulin R U500 110 units every morning and 100 every evening.

## 2023-01-10 NOTE — Unmapped (Signed)
Confirm use of CPAP at follow-up visit

## 2023-01-10 NOTE — Unmapped (Signed)
Still marked depressive symptoms however patient feels that overall she is doing much better.  Seems like she is getting along well with her mother.  Continue current management.  Patient has follow-up with psychiatry and therapist.- Continued home meds hydroxyzine, lamictal, depakote, trazodone, geodon, lyrica.

## 2023-01-10 NOTE — Unmapped (Signed)
Noted on scan of brain.  Will refer to neurology for evaluation.  ?due to years and years of hyperglycemia

## 2023-01-10 NOTE — Unmapped (Signed)
Chronic, worsening over the past few months. Concern for fluid overload given recent weight gain in her abdomen, recently increased PO lasix. Followed by Pulmonology. Has had PFTs previously, Pulm planning for repeat at next visit. 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 No asthma diagnosis. Previous sleep studies not consistent with [name], referral has been placed for updated sleep study per patient. Echo done 12/31/22 with normal EF, only significant for mildly elevated right atrial pressure in the setting of mild Pulmonary HTN. Most likely multifactorial due to OHS, deconditioning, volume overload. Outpatient pulmonary follow up and rehab planned. Held home symbicort and gave breo.  Remained on room air during admission.

## 2023-01-10 NOTE — Unmapped (Addendum)
Alk phos elevated, similar to prior. CT CAP during ED visit 02/06/21 showed variably enhancing liver lesions, most consistent with hemangioma, follow-up MRI abdomen 02/26/21 showed multisegmental solid hepatic neoplasia, favoring FNH and hepatic adenomatosis in setting of NAFLD. 12/14/21 CTAP noted normal liver contour, no focal liver lesions.  I am concerned that increase in abdominal girth is due to progression of liver disease with ascites.  Will get an ultrasound of abdomen to assess liver and also look for ascites as cause of increase in abdominal girth which is causing sob due to restrictive disease.  Await results of [place].

## 2023-01-10 NOTE — Unmapped (Signed)
Continue vitamin d supplmentation

## 2023-01-10 NOTE — Unmapped (Addendum)
Secondary to overdiuresis.  Cr back to baseline.  Cr of 1.93 on admission with Cr 1.33 the day prior. Recent increase in home PO lasix from 20mg  daily to 60mg  daily in setting of concern for fluid overload. AKI thought to be in the setting of cardiorenal syndrome given sx orthopnea and 10lb wt gain. Diuresed patient during admission initially with IV lasix, then torsemide with improvement in creatinine.  Discharged on 20mg  of torsemide.Marland Kitchen

## 2023-01-12 DIAGNOSIS — C9210 Chronic myeloid leukemia, BCR/ABL-positive, not having achieved remission: Principal | ICD-10-CM

## 2023-01-13 ENCOUNTER — Ambulatory Visit: Admit: 2023-01-13 | Discharge: 2023-01-13 | Payer: MEDICAID

## 2023-01-15 ENCOUNTER — Ambulatory Visit
Admit: 2023-01-15 | Discharge: 2023-01-15 | Payer: MEDICAID | Attending: Geriatric Medicine | Primary: Geriatric Medicine

## 2023-01-15 ENCOUNTER — Institutional Professional Consult (permissible substitution): Admit: 2023-01-15 | Discharge: 2023-01-15 | Payer: MEDICAID

## 2023-01-15 ENCOUNTER — Ambulatory Visit
Admit: 2023-01-15 | Discharge: 2023-01-15 | Payer: MEDICAID | Attending: Student in an Organized Health Care Education/Training Program | Primary: Student in an Organized Health Care Education/Training Program

## 2023-01-15 ENCOUNTER — Other Ambulatory Visit: Admit: 2023-01-15 | Discharge: 2023-01-15 | Payer: MEDICAID

## 2023-01-15 DIAGNOSIS — R7989 Other specified abnormal findings of blood chemistry: Principal | ICD-10-CM

## 2023-01-15 DIAGNOSIS — R300 Dysuria: Principal | ICD-10-CM

## 2023-01-15 DIAGNOSIS — I272 Pulmonary hypertension, unspecified: Principal | ICD-10-CM

## 2023-01-15 DIAGNOSIS — C9210 Chronic myeloid leukemia, BCR/ABL-positive, not having achieved remission: Principal | ICD-10-CM

## 2023-01-15 DIAGNOSIS — J984 Other disorders of lung: Principal | ICD-10-CM

## 2023-01-15 DIAGNOSIS — K7581 Nonalcoholic steatohepatitis (NASH): Principal | ICD-10-CM

## 2023-01-15 DIAGNOSIS — E669 Obesity, unspecified: Principal | ICD-10-CM

## 2023-01-15 DIAGNOSIS — R0602 Shortness of breath: Principal | ICD-10-CM

## 2023-01-15 LAB — COMPREHENSIVE METABOLIC PANEL
ALBUMIN: 3.6 g/dL (ref 3.4–5.0)
ALKALINE PHOSPHATASE: 173 U/L — ABNORMAL HIGH (ref 46–116)
ALT (SGPT): 11 U/L (ref 10–49)
ANION GAP: 8 mmol/L (ref 5–14)
AST (SGOT): 20 U/L (ref <=34)
BILIRUBIN TOTAL: 0.4 mg/dL (ref 0.3–1.2)
BLOOD UREA NITROGEN: 19 mg/dL (ref 9–23)
BUN / CREAT RATIO: 20
CALCIUM: 9.7 mg/dL (ref 8.7–10.4)
CHLORIDE: 101 mmol/L (ref 98–107)
CO2: 24 mmol/L (ref 20.0–31.0)
CREATININE: 0.93 mg/dL
EGFR CKD-EPI (2021) FEMALE: 83 mL/min/{1.73_m2} (ref >=60)
GLUCOSE RANDOM: 412 mg/dL (ref 70–179)
POTASSIUM: 3.9 mmol/L (ref 3.4–4.8)
PROTEIN TOTAL: 7.5 g/dL (ref 5.7–8.2)
SODIUM: 133 mmol/L — ABNORMAL LOW (ref 135–145)

## 2023-01-15 LAB — CBC W/ AUTO DIFF
BASOPHILS ABSOLUTE COUNT: 0.1 10*9/L (ref 0.0–0.1)
BASOPHILS RELATIVE PERCENT: 1.2 %
EOSINOPHILS ABSOLUTE COUNT: 0.2 10*9/L (ref 0.0–0.5)
EOSINOPHILS RELATIVE PERCENT: 2.6 %
HEMATOCRIT: 37.9 % (ref 34.0–44.0)
HEMOGLOBIN: 12.8 g/dL (ref 11.3–14.9)
LYMPHOCYTES ABSOLUTE COUNT: 3.2 10*9/L (ref 1.1–3.6)
LYMPHOCYTES RELATIVE PERCENT: 38 %
MEAN CORPUSCULAR HEMOGLOBIN CONC: 33.6 g/dL (ref 32.0–36.0)
MEAN CORPUSCULAR HEMOGLOBIN: 28 pg (ref 25.9–32.4)
MEAN CORPUSCULAR VOLUME: 83.2 fL (ref 77.6–95.7)
MEAN PLATELET VOLUME: 8.4 fL (ref 6.8–10.7)
MONOCYTES ABSOLUTE COUNT: 0.5 10*9/L (ref 0.3–0.8)
MONOCYTES RELATIVE PERCENT: 5.7 %
NEUTROPHILS ABSOLUTE COUNT: 4.4 10*9/L (ref 1.8–7.8)
NEUTROPHILS RELATIVE PERCENT: 52.5 %
PLATELET COUNT: 262 10*9/L (ref 150–450)
RED BLOOD CELL COUNT: 4.56 10*12/L (ref 3.95–5.13)
RED CELL DISTRIBUTION WIDTH: 14.7 % (ref 12.2–15.2)
WBC ADJUSTED: 8.4 10*9/L (ref 3.6–11.2)

## 2023-01-15 MED ORDER — TORSEMIDE 20 MG TABLET
ORAL_TABLET | Freq: Every day | ORAL | 3 refills | 100 days | Status: CP
Start: 2023-01-15 — End: ?

## 2023-01-15 MED ORDER — NITROFURANTOIN MONOHYDRATE/MACROCRYSTALS 100 MG CAPSULE
ORAL_CAPSULE | Freq: Two times a day (BID) | ORAL | 0 refills | 7 days | Status: CP
Start: 2023-01-15 — End: 2023-01-22

## 2023-01-15 NOTE — Unmapped (Signed)
Identified patient using two patient identifiers. Performed hand hygiene. Verified order for genetic testing, second time. Verified directions and paperwork with patient.    Paperwork signed by patient and doctor ahead of time. Patient did swab with RN in room and RN placed swab in container and prepared for shipping.   Patient will wait to hear from testing company and MD about her results.

## 2023-01-15 NOTE — Unmapped (Signed)
Patient ID: Jenna Mosley is a 32 y.o. female who presents for follow up of multiple medical problems.  Informant: Patient is accompanied by mother.  Assessment/Plan:   Restrictive lung disease secondary to obesity  -     Ambulatory referral to [facility]; Future    Pulmonary hypertension (CMS-HCC)  -     Ambulatory referral to [facility]; Future    Dysuria  Assessment & Plan:  Patient unable to give urine specimen today.  Order written for UA C&S which patient can provided any [facility] location.  Will get a presumptive prescription for Macrodantin in case patient has worsening symptoms.  Has had urosepsis in the past.  Would prefer she give [place] a urine before taking antibiotics.    Orders:  -     Cancel: Urinalysis with Microscopy with Culture Reflex  -     Urinalysis with Microscopy with Culture Reflex; Future  -     nitrofurantoin monohyd/m-cryst; Take 1 capsule (100 mg total) by mouth two (2) times a day for 7 days.    NASH (nonalcoholic steatohepatitis)  Assessment & Plan:  Alk phos elevated, similar to prior. CT CAP during ED visit 02/06/21 showed variably enhancing liver lesions, most consistent with hemangioma, follow-up MRI abdomen 02/26/21 showed multisegmental solid hepatic neoplasia, favoring FNH and hepatic adenomatosis in setting of NAFLD. 12/14/21 CTAP noted normal liver contour, no focal liver lesions.  Abdominal ultrasound performed last week showed hepatomegaly and mild splenomegaly.  No mention of ascites.  Patient is working on scheduling with hepatology for evaluation.  Needs to work on weight and calorie restriction.      Shortness of breath  Assessment & Plan:  Likely multifactorial.  Followed by Pulmonology. Has had PFTs previously, Pulm planning for repeat at next visit. 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 No asthma diagnosis. Previous sleep studies not consistent with [name], referral has been placed for updated sleep study per patient. Echo done 12/31/22 with normal EF, only significant for mildly elevated right atrial pressure in the setting of mild Pulmonary HTN.  Has appointment scheduled with cardiology.  Most likely multifactorial due to OHS, deconditioning, volume overload although I do not believe there is very much volume overload.. Outpatient pulmonary follow up and rehab planned.  Referral sent to [facility].      Other orders  -     torsemide; Take 1 tablet (20 mg total) by mouth daily. 40 mg every other day       Preventive services addressed today  We did not review preventive services today  -- Patient verbalized an understanding of today's assessment and recommendations, as well as the purpose of ongoing medications.  Return in about 4 weeks (around 02/12/2023).     Subjective:   HPI  History of Present Illness  The patient presents for evaluation of multiple medical concerns. She is accompanied by an adult female.    The swelling has been doing good since she is alternating torsemide 20 with 40 every other day. She noticed that when she takes the 24, it is more down the next day and she also urinates a lot. They did all the tests at oncology this morning. Her creatinine is normal. Her sodium is a little lower and her potassium was a little low. She feels like the fluid pill is working better.On the days when she takes less diuretic, she does not urinate much. Her breathing is a little bit more short of breath. Even today  when she was walking through the [place] town, she was walking a lot and she gets very out of breath just walking. She thinks it might be a conditioning thing. She has been taking it easy.     She has an appointment with cardiology next week. She has a sleep study, but it is not until 04/2022. She was given a referral for pulmonary rehab. The only pulmonary rehab [facility]  is either in [place] or in [place], which neither one is even close to them. She called around and found a pulmonary rehab at [facility] and that is convenient. She got the fax number and talked to them today.      She is having a little bit of discomfort when she urinates. She just had a period. She did not know if it was from her period because sometimes she gets internal pain with the polycystic. She is on her period right now. She is going a lot and then it is not necessarily burning, but it just feels like discomfort and a little bit of pain, but it is not every time she goes. It is at least like every other time she goes. She does not have any back pain. It feels crampy when she urinates. She can not tell if it is because she has not had a period. She has had blood in her urine and she can not really tell. Her periods are really heavy and bad and she really has a lot of trouble with pain and stuff with them because she has the polycystic. She sometimes can not even tell if she has a urinary tract infection because she is asymptomatic, which is why she had the sepsis. She can not take Cipro and Keflex because she has built a resistance. She has taken Macrobid before and she does okay on that if she eats with it. Sometimes it makes her sick if she does not eat with it.    Oncology is putting her back on her bosutinib today or tomorrow. They are going to recheck her in 2 weeks to check all her levels.  They think that she could get in remission, but they were saying like 5 years before they consider stopping it. They said they would do a trial to see what happens when they stop it and then she might go back on it like they said lifelong or she might be able to be off of it.       ROS  A comprehensive review of systems was conducted with negative results except as noted in the HPI above.          Objective:   Vital Signs  BP 118/58 (BP Site: L Arm, BP Position: Sitting, BP Cuff Size: Large)  - Pulse 69  - Ht 182.9 cm (6' 0.01)  - Wt (!) 169.6 kg (374 lb)  - LMP 01/03/2023 (Exact Date)  - SpO2 97%  - BMI 50.71 kg/m??  Body mass index is 50.71 kg/m??.  Exam  Physical Exam  Constitutional:       Appearance: Normal appearance.   Cardiovascular:      Rate and Rhythm: Normal rate and regular rhythm.   Pulmonary:      Effort: Pulmonary effort is normal. No respiratory distress.      Breath sounds: Normal breath sounds. No wheezing, rhonchi or rales.   Musculoskeletal:      Right lower leg: No edema.      Left lower leg: No  edema.   Lymphadenopathy:      Cervical: No cervical adenopathy.   Skin:     General: Skin is warm.   Neurological:      Mental Status: She is alert.   Psychiatric:         Mood and Affect: Mood normal.         Behavior: Behavior normal.         Thought Content: Thought content normal.             I've personally reviewed and summarized records in EPIC/Media and via CareEverywhere as well as medication, allergies, past medical, social, and family history.   I have reviewed the patient's medical history in detail and updated the computerized patient record.    Note - This chart has been prepared using the Dragon voice recognition system. Typographical errors may have occurred.  Attempts have been made to correct errors, however, inadvertent errors may persist.

## 2023-01-15 NOTE — Unmapped (Addendum)
It was great to see yout today! I would like you to restart the Bosutinib tomorrow at the same dose.    I will see you back in 2 weeks to see how it is going back on the medication.     If you have questions or concerns, you may call the Nurse call line at [phone].    Appointment on 01/15/2023   Component Date Value Ref Range Status    Sodium 01/15/2023 133 (L)  135 - 145 mmol/L Final    Potassium 01/15/2023 3.9  3.4 - 4.8 mmol/L Final    Chloride 01/15/2023 101  98 - 107 mmol/L Final    CO2 01/15/2023 24.0  20.0 - 31.0 mmol/L Final    Anion Gap 01/15/2023 8  5 - 14 mmol/L Final    BUN 01/15/2023 19  9 - 23 mg/dL Final    Creatinine 09/81/1914 0.93  0.55 - 1.02 mg/dL Final    BUN/Creatinine Ratio 01/15/2023 20   Final    eGFR CKD-EPI (2021) Female 01/15/2023 83  >=60 mL/min/1.37m2 Final    eGFR calculated with CKD-EPI 2021 equation in accordance with [employer] and [employer] of Nephrology Task Force recommendations.    Glucose 01/15/2023 412 (HH)  70 - 179 mg/dL Final    Calcium 78/29/5621 9.7  8.7 - 10.4 mg/dL Final    Albumin 30/86/5784 3.6  3.4 - 5.0 g/dL Final    Total Protein 01/15/2023 7.5  5.7 - 8.2 g/dL Final    Total Bilirubin 01/15/2023 0.4  0.3 - 1.2 mg/dL Final    AST 69/62/9528 20  <=34 U/L Final    ALT 01/15/2023 11  10 - 49 U/L Final    Alkaline Phosphatase 01/15/2023 173 (H)  46 - 116 U/L Final    WBC 01/15/2023 8.4  3.6 - 11.2 10*9/L Final    RBC 01/15/2023 4.56  3.95 - 5.13 10*12/L Final    HGB 01/15/2023 12.8  11.3 - 14.9 g/dL Final    HCT 41/32/4401 37.9  34.0 - 44.0 % Final    MCV 01/15/2023 83.2  77.6 - 95.7 fL Final    MCH 01/15/2023 28.0  25.9 - 32.4 pg Final    MCHC 01/15/2023 33.6  32.0 - 36.0 g/dL Final    RDW 02/72/5366 14.7  12.2 - 15.2 % Final    MPV 01/15/2023 8.4  6.8 - 10.7 fL Final    Platelet 01/15/2023 262  150 - 450 10*9/L Final    Neutrophils % 01/15/2023 52.5  % Final    Lymphocytes % 01/15/2023 38.0  % Final    Monocytes % 01/15/2023 5.7  % Final Eosinophils % 01/15/2023 2.6  % Final    Basophils % 01/15/2023 1.2  % Final    Absolute Neutrophils 01/15/2023 4.4  1.8 - 7.8 10*9/L Final    Absolute Lymphocytes 01/15/2023 3.2  1.1 - 3.6 10*9/L Final    Absolute Monocytes 01/15/2023 0.5  0.3 - 0.8 10*9/L Final    Absolute Eosinophils 01/15/2023 0.2  0.0 - 0.5 10*9/L Final    Absolute Basophils 01/15/2023 0.1  0.0 - 0.1 10*9/L Final

## 2023-01-16 LAB — VITAMIN D 25 HYDROXY: VITAMIN D, TOTAL (25OH): 24.9 ng/mL (ref 20.0–80.0)

## 2023-01-16 MED ORDER — TORSEMIDE 20 MG TABLET
ORAL_TABLET | Freq: Every day | ORAL | 3 refills | 100 days | Status: CP
Start: 2023-01-16 — End: ?

## 2023-01-16 MED ORDER — MOUNJARO 5 MG/0.5 ML SUBCUTANEOUS PEN INJECTOR
SUBCUTANEOUS | 0 refills | 0 days | Status: CP
Start: 2023-01-16 — End: 2023-02-07

## 2023-01-16 NOTE — Unmapped (Signed)
Patient unable to give urine specimen today.  Order written for UA C&S which patient can provided any [facility] location.  Will get a presumptive prescription for Macrodantin in case patient has worsening symptoms.  Has had urosepsis in the past.  Would prefer she give [place] a urine before taking antibiotics.

## 2023-01-16 NOTE — Unmapped (Signed)
Likely multifactorial.  Followed by Pulmonology. Has had PFTs previously, Pulm planning for repeat at next visit. 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 No asthma diagnosis. Previous sleep studies not consistent with [name], referral has been placed for updated sleep study per patient. Echo done 12/31/22 with normal EF, only significant for mildly elevated right atrial pressure in the setting of mild Pulmonary HTN.  Has appointment scheduled with cardiology.  Most likely multifactorial due to OHS, deconditioning, volume overload although I do not believe there is very much volume overload.. Outpatient pulmonary follow up and rehab planned.  Referral sent to [facility].

## 2023-01-16 NOTE — Unmapped (Signed)
Alk phos elevated, similar to prior. CT CAP during ED visit 02/06/21 showed variably enhancing liver lesions, most consistent with hemangioma, follow-up MRI abdomen 02/26/21 showed multisegmental solid hepatic neoplasia, favoring FNH and hepatic adenomatosis in setting of NAFLD. 12/14/21 CTAP noted normal liver contour, no focal liver lesions.  Abdominal ultrasound performed last week showed hepatomegaly and mild splenomegaly.  No mention of ascites.  Patient is working on scheduling with hepatology for evaluation.  Needs to work on weight and calorie restriction.
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
## 2023-01-20 ENCOUNTER — Ambulatory Visit: Admit: 2023-01-20 | Discharge: 2023-01-21 | Payer: MEDICAID | Attending: Internal Medicine | Primary: Internal Medicine
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
## 2023-01-21 ENCOUNTER — Emergency Department
Admit: 2023-01-21 | Discharge: 2023-01-21 | Disposition: A | Discharge status: Home / Self Care | Payer: MEDICAID | Attending: Family

## 2023-01-21 ENCOUNTER — Ambulatory Visit
Admit: 2023-01-21 | Discharge: 2023-01-21 | Disposition: A | Discharge status: Home / Self Care | Payer: MEDICAID | Attending: Family

## 2023-01-21 DIAGNOSIS — U071 COVID-19: Principal | ICD-10-CM

## 2023-01-21 NOTE — Unmapped (Signed)
Received message from [facility] stating 2nd sample is needed for this patient.     Called and spoke to representative. They state they started to extract the DNA from the genetic testing done, but there was too low of concentration for testing. She states we need a new sample.      Called and spoke to patient. NV scheduled for 01/22/23 to recollect.

## 2023-01-21 NOTE — Unmapped (Signed)
Pt c/o cough, with sinus and chest congestion since yesterday pt is in NAD , respirations WNL on arrival. Mother is at the bedside.

## 2023-01-21 NOTE — Unmapped (Signed)
Pt here for flu like symptoms, sore throat, fever, chills, nausea and cough since last night. Pt has been taking tylenol for relief.
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
## 2023-01-22 NOTE — Unmapped (Signed)
Dear Jenna Mosley,    I hope you are feeling all right and getting over COVID quickly.  In regards to your labs I noticed your vitamin D level is at goal but at the low end of normal.  It looks like you are taking calcium with vitamin D twice a day.  I would like you to take an additional vitamin D 1000 international units once daily.  This is available over-the-counter.  Please let me know if you want me to send a prescription to your pharmacy.

## 2023-01-27 NOTE — Unmapped (Signed)
Verneal Sui Cosman has been contacted in regards to their refill of Bosulif. At this time, they have declined refill due to  medication was discontinued .

## 2023-01-28 ENCOUNTER — Encounter: Payer: Medicaid Other | Attending: Geriatric Medicine | Admitting: *Deleted

## 2023-01-28 ENCOUNTER — Encounter: Payer: Self-pay | Admitting: *Deleted

## 2023-01-28 DIAGNOSIS — Z5189 Encounter for other specified aftercare: Secondary | ICD-10-CM | POA: Insufficient documentation

## 2023-01-28 DIAGNOSIS — E669 Obesity, unspecified: Secondary | ICD-10-CM | POA: Insufficient documentation

## 2023-01-28 DIAGNOSIS — I272 Pulmonary hypertension, unspecified: Secondary | ICD-10-CM | POA: Insufficient documentation

## 2023-01-28 DIAGNOSIS — J984 Other disorders of lung: Secondary | ICD-10-CM | POA: Insufficient documentation

## 2023-01-28 NOTE — Progress Notes (Signed)
Virtual orientation call completed today. shehas an appointment on Date: [id number]  for EP eval and gym Orientation.  Documentation of diagnosis can be found in [facility] Date: 01/15/2023 .

## 2023-01-29 ENCOUNTER — Institutional Professional Consult (permissible substitution): Admit: 2023-01-29 | Discharge: 2023-01-30 | Payer: MEDICAID

## 2023-01-29 ENCOUNTER — Ambulatory Visit: Admit: 2023-01-29 | Discharge: 2023-01-30 | Payer: MEDICAID

## 2023-01-29 ENCOUNTER — Ambulatory Visit
Admit: 2023-01-29 | Discharge: 2023-01-30 | Payer: MEDICAID | Attending: Student in an Organized Health Care Education/Training Program | Primary: Student in an Organized Health Care Education/Training Program

## 2023-01-29 LAB — COMPREHENSIVE METABOLIC PANEL
ALBUMIN: 3.6 g/dL (ref 3.4–5.0)
ALKALINE PHOSPHATASE: 186 U/L — ABNORMAL HIGH (ref 46–116)
ALT (SGPT): 12 U/L (ref 10–49)
ANION GAP: 8 mmol/L (ref 5–14)
AST (SGOT): 25 U/L (ref <=34)
BILIRUBIN TOTAL: 0.3 mg/dL (ref 0.3–1.2)
BLOOD UREA NITROGEN: 18 mg/dL (ref 9–23)
BUN / CREAT RATIO: 17
CALCIUM: 9.7 mg/dL (ref 8.7–10.4)
CHLORIDE: 100 mmol/L (ref 98–107)
CO2: 24 mmol/L (ref 20.0–31.0)
CREATININE: 1.04 mg/dL — ABNORMAL HIGH
EGFR CKD-EPI (2021) FEMALE: 73 mL/min/{1.73_m2} (ref >=60)
GLUCOSE RANDOM: 427 mg/dL (ref 70–179)
POTASSIUM: 4.7 mmol/L (ref 3.4–4.8)
PROTEIN TOTAL: 7.5 g/dL (ref 5.7–8.2)
SODIUM: 132 mmol/L — ABNORMAL LOW (ref 135–145)

## 2023-01-29 LAB — CBC W/ AUTO DIFF
BASOPHILS ABSOLUTE COUNT: 0.1 10*9/L (ref 0.0–0.1)
BASOPHILS RELATIVE PERCENT: 0.6 %
EOSINOPHILS ABSOLUTE COUNT: 0.2 10*9/L (ref 0.0–0.5)
EOSINOPHILS RELATIVE PERCENT: 2.1 %
HEMATOCRIT: 36.5 % (ref 34.0–44.0)
HEMOGLOBIN: 12.1 g/dL (ref 11.3–14.9)
LYMPHOCYTES ABSOLUTE COUNT: 3.1 10*9/L (ref 1.1–3.6)
LYMPHOCYTES RELATIVE PERCENT: 36.8 %
MEAN CORPUSCULAR HEMOGLOBIN CONC: 33.1 g/dL (ref 32.0–36.0)
MEAN CORPUSCULAR HEMOGLOBIN: 27.7 pg (ref 25.9–32.4)
MEAN CORPUSCULAR VOLUME: 83.8 fL (ref 77.6–95.7)
MEAN PLATELET VOLUME: 8.7 fL (ref 6.8–10.7)
MONOCYTES ABSOLUTE COUNT: 0.4 10*9/L (ref 0.3–0.8)
MONOCYTES RELATIVE PERCENT: 4.3 %
NEUTROPHILS ABSOLUTE COUNT: 4.8 10*9/L (ref 1.8–7.8)
NEUTROPHILS RELATIVE PERCENT: 56.2 %
PLATELET COUNT: 289 10*9/L (ref 150–450)
RED BLOOD CELL COUNT: 4.36 10*12/L (ref 3.95–5.13)
RED CELL DISTRIBUTION WIDTH: 14.9 % (ref 12.2–15.2)
WBC ADJUSTED: 8.6 10*9/L (ref 3.6–11.2)

## 2023-01-29 MED ADMIN — leuprolide (LUPRON) 11.25 mg injection: INTRAMUSCULAR | @ 17:00:00 | Stop: 2023-01-29

## 2023-01-29 MED ADMIN — leuprolide (LUPRON) injection 11.25 mg: 11.25 mg | INTRAMUSCULAR | @ 17:00:00 | Stop: 2023-01-29

## 2023-01-29 NOTE — Unmapped (Signed)
1302 Administered Lupron 11.25 mg IM and patient tolerated well. Applied band-aid given by Georgetta Haber LPN.    0981 Labs drawn peripherally and sent for analysis. Bandage applied after hemostasis. Lupron 11.25 mg given IM to right upper outer quadrant of buttocks slowly. Tolerated this well. Band-aid applied by Georgetta Haber LPN

## 2023-01-29 NOTE — Unmapped (Addendum)
Glad you are feeling better! I would like you to continue your Bosutinib 500 mg daily.    I will see you back in 2 months.     Make sure you start your Mounjaro back up to help control your blood sugars. Try hydrating more with water.      If you have questions or concerns, you may call the Nurse call line at [phone].    Clinical Support on 01/29/2023   Component Date Value Ref Range Status    Sodium 01/29/2023 132 (L)  135 - 145 mmol/L Final    Potassium 01/29/2023 4.7  3.4 - 4.8 mmol/L Final    Chloride 01/29/2023 100  98 - 107 mmol/L Final    CO2 01/29/2023 24.0  20.0 - 31.0 mmol/L Final    Anion Gap 01/29/2023 8  5 - 14 mmol/L Final    BUN 01/29/2023 18  9 - 23 mg/dL Final    Creatinine 02/72/5366 1.04 (H)  0.55 - 1.02 mg/dL Final    BUN/Creatinine Ratio 01/29/2023 17   Final    eGFR CKD-EPI (2021) Female 01/29/2023 73  >=60 mL/min/1.69m2 Final    eGFR calculated with CKD-EPI 2021 equation in accordance with [employer] and [employer] of Nephrology Task Force recommendations.    Glucose 01/29/2023 427 (HH)  70 - 179 mg/dL Final    Calcium 44/01/4741 9.7  8.7 - 10.4 mg/dL Final    Albumin 59/56/3875 3.6  3.4 - 5.0 g/dL Final    Total Protein 01/29/2023 7.5  5.7 - 8.2 g/dL Final    Total Bilirubin 01/29/2023 0.3  0.3 - 1.2 mg/dL Final    AST 64/33/2951 25  <=34 U/L Final    ALT 01/29/2023 12  10 - 49 U/L Final    Alkaline Phosphatase 01/29/2023 186 (H)  46 - 116 U/L Final    WBC 01/29/2023 8.6  3.6 - 11.2 10*9/L Final    RBC 01/29/2023 4.36  3.95 - 5.13 10*12/L Final    HGB 01/29/2023 12.1  11.3 - 14.9 g/dL Final    HCT 88/41/6606 36.5  34.0 - 44.0 % Final    MCV 01/29/2023 83.8  77.6 - 95.7 fL Final    MCH 01/29/2023 27.7  25.9 - 32.4 pg Final    MCHC 01/29/2023 33.1  32.0 - 36.0 g/dL Final    RDW 30/16/0109 14.9  12.2 - 15.2 % Final    MPV 01/29/2023 8.7  6.8 - 10.7 fL Final    Platelet 01/29/2023 289  150 - 450 10*9/L Final    Neutrophils % 01/29/2023 56.2  % Final    Lymphocytes % 01/29/2023 36.8  % Final    Monocytes % 01/29/2023 4.3  % Final    Eosinophils % 01/29/2023 2.1  % Final    Basophils % 01/29/2023 0.6  % Final    Absolute Neutrophils 01/29/2023 4.8  1.8 - 7.8 10*9/L Final    Absolute Lymphocytes 01/29/2023 3.1  1.1 - 3.6 10*9/L Final    Absolute Monocytes 01/29/2023 0.4  0.3 - 0.8 10*9/L Final    Absolute Eosinophils 01/29/2023 0.2  0.0 - 0.5 10*9/L Final    Absolute Basophils 01/29/2023 0.1  0.0 - 0.1 10*9/L Final
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
## 2023-02-02 ENCOUNTER — Encounter: Payer: Medicaid Other | Admitting: *Deleted

## 2023-02-02 VITALS — Ht 72.0 in | Wt 376.5 lb

## 2023-02-02 DIAGNOSIS — J984 Other disorders of lung: Secondary | ICD-10-CM

## 2023-02-02 DIAGNOSIS — C9210 Chronic myeloid leukemia, BCR/ABL-positive, not having achieved remission: Principal | ICD-10-CM

## 2023-02-02 DIAGNOSIS — E669 Obesity, unspecified: Secondary | ICD-10-CM | POA: Diagnosis not present

## 2023-02-02 DIAGNOSIS — I272 Pulmonary hypertension, unspecified: Secondary | ICD-10-CM

## 2023-02-02 DIAGNOSIS — Z5189 Encounter for other specified aftercare: Secondary | ICD-10-CM | POA: Diagnosis not present

## 2023-02-02 MED ORDER — BOSUTINIB 500 MG TABLET
ORAL_TABLET | 2 refills | 0 days | Status: CP
Start: 2023-02-02 — End: ?
  Filled 2023-02-04: qty 30, 30d supply, fill #0

## 2023-02-02 MED ORDER — BOSULIF 500 MG TABLET
ORAL_TABLET | Freq: Every day | ORAL | 5 refills | 30 days | Status: CN
Start: 2023-02-02 — End: ?

## 2023-02-02 NOTE — Unmapped (Signed)
Pt is requesting refill    Most recent clinic visit: 01/29/2023  Next clinic visit:  03/31/2023

## 2023-02-02 NOTE — Unmapped (Signed)
[facility] Refill Coordination Note    Specialty Medication(s) to be Shipped:   Hematology/Oncology: Bosulif    Other medication(s) to be shipped: No additional medications requested for fill at this time     Jenna Mosley, DOB: May 02, 1991  Phone: [phone] (home)       All above HIPAA information was verified with patient.     Was a Nurse, learning disability used for this call? No    Completed refill call assessment today to schedule patient's medication shipment from the [facility] [phone]).  All relevant notes have been reviewed.     Specialty medication(s) and dose(s) confirmed: Regimen is correct and unchanged.   Changes to medications: Jenna Mosley reports no changes at this time.  Changes to insurance: No  New side effects reported not previously addressed with a pharmacist or physician: None reported  Questions for the pharmacist: No    Confirmed patient received a Conservation officer, historic buildings and a Surveyor, mining with first shipment. The patient will receive a drug information handout for each medication shipped and additional FDA Medication Guides as required.       DISEASE/MEDICATION-SPECIFIC INFORMATION        N/A    SPECIALTY MEDICATION ADHERENCE     Medication Adherence    Patient reported X missed doses in the last month: >5  Specialty Medication: Bosulif 500mg  daily  Patient is on additional specialty medications: No  Patient is on more than two specialty medications: No  Any gaps in refill history greater than 2 weeks in the last 3 months: no  Demonstrates understanding of importance of adherence: yes  Informant: patient          Were doses missed due to medication being on hold? Yes - med was on hold a few weeks ago but recent appt on 3/15 stated she needs to restart.     Bosulif 500 mg: 4 days of medicine on hand     REFERRAL TO PHARMACIST     Referral to the pharmacist: Not needed      [facility]     Shipping address confirmed in Epic.     Patient was notified of new phone menu : No    Delivery Scheduled: Yes, Expected medication delivery date: 02/04/23.     Medication will be delivered via Same Day Courier to the prescription address in Epic WAM.    Oliva Bustard, PharmD   [facility] Specialty Pharmacist

## 2023-02-02 NOTE — Patient Instructions (Signed)
Patient Instructions  Patient Details  Name: Cynthia Lang MRN: [id number] Date of Birth: 1991/01/22 Referring Provider:  Andree Coss, MD  Below are your personal goals for exercise, nutrition, and risk factors. Our goal is to help you stay on track towards obtaining and maintaining these goals. We will be discussing your progress on these goals with you throughout the program.  Initial Exercise Prescription:  Initial Exercise Prescription - 02/02/23 1700       Date of Initial Exercise RX and Referring Provider   Date 02/02/23    Referring Provider Nurum Romeo Apple Erdem      Oxygen   Maintain Oxygen Saturation 88% or higher      T5 Nustep   Level 1    SPM 80    Minutes 15    METs 2.41      Track   Laps 12    Minutes 15    METs 1.65      Prescription Details   Frequency (times per week) 3    Duration Progress to 30 minutes of continuous aerobic without signs/symptoms of physical distress      Intensity   THRR 40-80% of Max Heartrate 113-162    Ratings of Perceived Exertion 11-13    Perceived Dyspnea 0-4      Progression   Progression Continue to progress workloads to maintain intensity without signs/symptoms of physical distress.      Resistance Training   Training Prescription Yes    Weight 3    Reps 10-15             Exercise Goals: Frequency: Be able to perform aerobic exercise two to three times per week in program working toward 2-5 days per week of home exercise.  Intensity: Work with a perceived exertion of 11 (fairly light) - 15 (hard) while following your exercise prescription.  We will make changes to your prescription with you as you progress through the program.   Duration: Be able to do 30 to 45 minutes of continuous aerobic exercise in addition to a 5 minute warm-up and a 5 minute cool-down routine.   Nutrition Goals: Your personal nutrition goals will be established when you do your nutrition analysis with the dietician.  The following are  general nutrition guidelines to follow: Cholesterol < 200mg /day Sodium < 1500mg /day Fiber: Women under 50 yrs - 25 grams per day  Personal Goals:  Personal Goals and Risk Factors at Admission - 02/02/23 1748       Core Components/Risk Factors/Patient Goals on Admission    Weight Management Yes;Obesity    Intervention Weight Management: Develop a combined nutrition and exercise program designed to reach desired caloric intake, while maintaining appropriate intake of nutrient and fiber, sodium and fats, and appropriate energy expenditure required for the weight goal.;Weight Management: Provide education and appropriate resources to help participant work on and attain dietary goals.;Weight Management/Obesity: Establish reasonable short term and long term weight goals.;Obesity: Provide education and appropriate resources to help participant work on and attain dietary goals.    Admit Weight 376 lb 8 oz (170.8 kg)    Goal Weight: Short Term 370 lb (167.8 kg)    Goal Weight: Long Term 340 lb (154.2 kg)    Expected Outcomes Short Term: Continue to assess and modify interventions until short term weight is achieved;Long Term: Adherence to nutrition and physical activity/exercise program aimed toward attainment of established weight goal;Weight Loss: Understanding of general recommendations for a balanced deficit meal plan, which promotes 1-2  lb weight loss per week and includes a negative energy balance of [id number] kcal/d;Understanding recommendations for meals to include 15-35% energy as protein, 25-35% energy from fat, 35-60% energy from carbohydrates, less than 200mg  of dietary cholesterol, 20-35 gm of total fiber daily;Understanding of distribution of calorie intake throughout the day with the consumption of 4-5 meals/snacks    Improve shortness of breath with ADL's Yes    Intervention Provide education, individualized exercise plan and daily activity instruction to help decrease symptoms of SOB with  activities of daily living.    Expected Outcomes Short Term: Improve cardiorespiratory fitness to achieve a reduction of symptoms when performing ADLs;Long Term: Be able to perform more ADLs without symptoms or delay the onset of symptoms    Increase knowledge of respiratory medications and ability to use respiratory devices properly  Yes    Intervention Provide education and demonstration as needed of appropriate use of medications, inhalers, and oxygen therapy.    Expected Outcomes Short Term: Achieves understanding of medications use. Understands that oxygen is a medication prescribed by physician. Demonstrates appropriate use of inhaler and oxygen therapy.;Long Term: Maintain appropriate use of medications, inhalers, and oxygen therapy.    Diabetes Yes    Intervention Provide education about signs/symptoms and action to take for hypo/hyperglycemia.;Provide education about proper nutrition, including hydration, and aerobic/resistive exercise prescription along with prescribed medications to achieve blood glucose in normal ranges: Fasting glucose 65-99 mg/dL    Expected Outcomes Short Term: Participant verbalizes understanding of the signs/symptoms and immediate care of hyper/hypoglycemia, proper foot care and importance of medication, aerobic/resistive exercise and nutrition plan for blood glucose control.;Long Term: Attainment of HbA1C < 7%.    Hypertension Yes    Intervention Provide education on lifestyle modifcations including regular physical activity/exercise, weight management, moderate sodium restriction and increased consumption of fresh fruit, vegetables, and low fat dairy, alcohol moderation, and smoking cessation.;Monitor prescription use compliance.    Expected Outcomes Short Term: Continued assessment and intervention until BP is < 140/68mm HG in hypertensive participants. < 130/81mm HG in hypertensive participants with diabetes, heart failure or chronic kidney disease.;Long Term:  Maintenance of blood pressure at goal levels.             Tobacco Use Initial Evaluation: Social History   Tobacco Use  Smoking Status Former   Packs/day: 0.50   Years: 10.00   Additional pack years: 0.00   Total pack years: 5.00   Types: Cigarettes   Quit date: 2014   Years since quitting: 10.2  Smokeless Tobacco Never    Exercise Goals and Review:  Exercise Goals     Row Name 02/02/23 1744             Exercise Goals   Increase Physical Activity Yes       Intervention Provide advice, education, support and counseling about physical activity/exercise needs.;Develop an individualized exercise prescription for aerobic and resistive training based on initial evaluation findings, risk stratification, comorbidities and participant's personal goals.       Expected Outcomes Short Term: Attend rehab on a regular basis to increase amount of physical activity.;Long Term: Add in home exercise to make exercise part of routine and to increase amount of physical activity.;Long Term: Exercising regularly at least 3-5 days a week.       Increase Strength and Stamina Yes       Intervention Provide advice, education, support and counseling about physical activity/exercise needs.;Develop an individualized exercise prescription for aerobic and resistive training based on initial  evaluation findings, risk stratification, comorbidities and participant's personal goals.       Expected Outcomes Short Term: Increase workloads from initial exercise prescription for resistance, speed, and METs.;Short Term: Perform resistance training exercises routinely during rehab and add in resistance training at home;Long Term: Improve cardiorespiratory fitness, muscular endurance and strength as measured by increased METs and functional capacity (6MWT)       Able to understand and use rate of perceived exertion (RPE) scale Yes       Intervention Provide education and explanation on how to use RPE scale        Expected Outcomes Short Term: Able to use RPE daily in rehab to express subjective intensity level;Long Term:  Able to use RPE to guide intensity level when exercising independently       Able to understand and use Dyspnea scale Yes       Intervention Provide education and explanation on how to use Dyspnea scale       Expected Outcomes Short Term: Able to use Dyspnea scale daily in rehab to express subjective sense of shortness of breath during exertion;Long Term: Able to use Dyspnea scale to guide intensity level when exercising independently       Knowledge and understanding of Target Heart Rate Range (THRR) Yes       Intervention Provide education and explanation of THRR including how the numbers were predicted and where they are located for reference       Expected Outcomes Short Term: Able to state/look up THRR;Long Term: Able to use THRR to govern intensity when exercising independently;Short Term: Able to use daily as guideline for intensity in rehab       Able to check pulse independently Yes       Intervention Provide education and demonstration on how to check pulse in carotid and radial arteries.;Review the importance of being able to check your own pulse for safety during independent exercise       Expected Outcomes Short Term: Able to explain why pulse checking is important during independent exercise       Understanding of Exercise Prescription Yes       Intervention Provide education, explanation, and written materials on patient's individual exercise prescription       Expected Outcomes Short Term: Able to explain program exercise prescription;Long Term: Able to explain home exercise prescription to exercise independently                Copy of goals given to participant.

## 2023-02-02 NOTE — Progress Notes (Signed)
Pulmonary Individual Treatment Plan  Patient Details  Name: Cynthia Lang MRN: [id number] Date of Birth: [birth year] Referring Provider:   Flowsheet Row Pulmonary Rehab from 02/02/2023 in [facility] Cardiac and Pulmonary Rehab  Referring Provider Nurum Romeo Apple Erdem       Initial Encounter Date:  Flowsheet Row Pulmonary Rehab from 02/02/2023 in [facility] Cardiac and Pulmonary Rehab  Date 02/02/23       Visit Diagnosis: Pulmonary hypertension ([place])  Restrictive lung disease  Patient's Home Medications on Admission:  Current Outpatient Medications:    ACCU-CHEK GUIDE test strip, CHECK SUGARS BEFORE MEALS 3 TIMES DAILY FOR INSULIN DEPENDANT TYPE TWO DIABETES., Disp: , Rfl:    atenolol (TENORMIN) 100 MG tablet, Take 100 mg by mouth daily., Disp: , Rfl:    bosutinib (BOSULIF) 500 MG tablet, Take 500 mg by mouth daily with breakfast. Take with food., Disp: , Rfl:    CALTRATE 600+D3 600-20 MG-MCG TABS, Take 1 tablet by mouth 2 (two) times daily., Disp: , Rfl:    cetirizine (ZYRTEC) 10 MG tablet, Take 10 mg by mouth every morning., Disp: , Rfl:    clindamycin (CLEOCIN T) 1 % SWAB, Apply topically 2 (two) times daily. (Patient not taking: Reported on 01/28/2023), Disp: , Rfl:    clobetasol (TEMOVATE) 0.05 % external solution, Apply topically 2 (two) times daily as needed., Disp: , Rfl:    Continuous Blood Gluc Sensor (DEXCOM G6 SENSOR) MISC, , Disp: , Rfl:    cyclobenzaprine (FLEXERIL) 10 MG tablet, Take by mouth., Disp: , Rfl:    divalproex (DEPAKOTE ER) 500 MG 24 hr tablet, Take 1 tablet (500 mg total) by mouth at bedtime., Disp: 30 tablet, Rfl: 3   ferrous sulfate 325 (65 FE) MG EC tablet, Take by mouth., Disp: , Rfl:    glipiZIDE (GLUCOTROL XL) 10 MG 24 hr tablet, Take 20 mg by mouth daily. (Patient not taking: Reported on 01/28/2023), Disp: , Rfl:    HUMULIN R U-500 KWIKPEN 500 UNIT/ML KwikPen, Inject into the skin., Disp: , Rfl:    hydrochlorothiazide (HYDRODIURIL) 25 MG tablet, Take by mouth.  (Patient not taking: Reported on 01/28/2023), Disp: , Rfl:    hydrocortisone 2.5 % ointment, Apply 1 application topically 2 (two) times daily. (Patient not taking: Reported on 01/28/2023), Disp: , Rfl:    hydrOXYzine (ATARAX) 50 MG tablet, TAKE ONE (1) TO ONE AND ONE-HALF (1+1/2)TABLET BY MOUTH AT BEDTIME AS NEEDED FORANXIETY OR SLEEP, Disp: 45 tablet, Rfl: 2   lamoTRIgine (LAMICTAL) 200 MG tablet, Take 1 tablet (200 mg total) by mouth 2 (two) times daily., Disp: 60 tablet, Rfl: 3   metFORMIN (GLUCOPHAGE) 1000 MG tablet, Take by mouth., Disp: , Rfl:    MOUNJARO 2.5 MG/0.5ML Pen, SMARTSIG:0.5 Milliliter(s) SUB-Q Once a Week, Disp: , Rfl:    norethindrone (AYGESTIN) 5 MG tablet, Take 5 mg by mouth every morning., Disp: , Rfl:    NOVOFINE PEN NEEDLE 32G X 6 MM MISC, Inject into the skin 3 (three) times daily. (Patient not taking: Reported on 01/28/2023), Disp: , Rfl:    pantoprazole (PROTONIX) 40 MG tablet, Take 40 mg by mouth every morning., Disp: , Rfl:    pregabalin (LYRICA) 75 MG capsule, TAKE 1 CAPSULE BY MOUTH EVERY MORNING and 2 EVERY EVENING, Disp: , Rfl:    torsemide (DEMADEX) 20 MG tablet, Take 20 mg by mouth every other day., Disp: , Rfl:    traZODone (DESYREL) 100 MG tablet, Take 2 tablets (200 mg total) by mouth at bedtime., Disp:  60 tablet, Rfl: 3   TRESIBA FLEXTOUCH 200 UNIT/ML FlexTouch Pen, Inject into the skin. (Patient not taking: Reported on 01/28/2023), Disp: , Rfl:    triamcinolone ointment (KENALOG) 0.1 %, Apply topically., Disp: , Rfl:    valsartan (DIOVAN) 160 MG tablet, Take by mouth., Disp: , Rfl:    VENTOLIN HFA 108 (90 Base) MCG/ACT inhaler, Inhale 2 puffs into the lungs every 6 (six) hours as needed., Disp: , Rfl:    VICTOZA 18 MG/3ML SOPN, SMARTSIG:0.3 Milliliter(s) SUB-Q Daily (Patient not taking: Reported on 01/28/2023), Disp: , Rfl:    ziprasidone (GEODON) 80 MG capsule, TAKE 1 CAPSULE BY MOUTH TWICE DAILY WITHFOOD., Disp: 60 capsule, Rfl: 3  Past Medical History: Past  Medical History:  Diagnosis Date   Depression    Diabetes mellitus, type II ([place])    Hypertension    Leukemia, 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   PCOS (polycystic ovarian syndrome)    PTSD (post-traumatic stress disorder)     Tobacco Use: Social History   Tobacco Use  Smoking Status Former   Packs/day: 0.50   Years: 10.00   Additional pack years: 0.00   Total pack years: 5.00   Types: Cigarettes   Quit date: 2014   Years since quitting: 10.2  Smokeless Tobacco Never    Labs: Review Flowsheet       Latest Ref Rng & Units 10/18/2021  Labs for ITP Cardiac and Pulmonary Rehab  Cholestrol 0 - 200 mg/dL 165   LDL (calc) 0 - 99 mg/dL 98   HDL-C >40 mg/dL 36   Trlycerides <150 mg/dL 156      Pulmonary Assessment Scores:  Pulmonary Assessment Scores     Row Name 02/02/23 1750         ADL UCSD   ADL Phase Entry     SOB Score total 90     Rest 1     Walk 3     Stairs 5     Bath 4     Dress 3     Shop 5       CAT Score   CAT Score 21       mMRC Score   mMRC Score 3              UCSD: Self-administered rating of dyspnea associated with activities of daily living (ADLs) 6-point scale (0 = "not at all" to 5 = "maximal or unable to do because of breathlessness")  Scoring Scores range from 0 to 120.  Minimally important difference is 5 units  CAT: CAT can identify the health impairment of COPD patients and is better correlated with disease progression.  CAT has a scoring range of zero to 40. The CAT score is classified into four groups of low (less than 10), medium (10 - 20), high (21-30) and very high (31-40) based on the impact level of disease on health status. A CAT score over 10 suggests significant symptoms.  A worsening CAT score could be explained by an exacerbation, poor medication adherence, poor inhaler technique, or progression of COPD or comorbid conditions.  CAT MCID is 2 points  mMRC: mMRC (Modified Medical Research Council) Dyspnea Scale is  used to assess the degree of baseline functional disability in patients of respiratory disease due to dyspnea. No minimal important difference is established. A decrease in score of 1 point or greater is considered a positive change.   Pulmonary Function Assessment:   Exercise Target Goals: Exercise Program Goal: Individual exercise prescription  set using results from initial 6 min walk test and THRR while considering  patient's activity barriers and safety.   Exercise Prescription Goal: Initial exercise prescription builds to 30-45 minutes a day of aerobic activity, 2-3 days per week.  Home exercise guidelines will be given to patient during program as part of exercise prescription that the participant will acknowledge.  Education: Aerobic Exercise: - Group verbal and visual presentation on the components of exercise prescription. Introduces F.I.T.T principle from ACSM for exercise prescriptions.  Reviews F.I.T.T. principles of aerobic exercise including progression. Written material given at graduation.   Education: Resistance Exercise: - Group verbal and visual presentation on the components of exercise prescription. Introduces F.I.T.T principle from ACSM for exercise prescriptions  Reviews F.I.T.T. principles of resistance exercise including progression. Written material given at graduation.    Education: Exercise & Equipment Safety: - Individual verbal instruction and demonstration of equipment use and safety with use of the equipment. Flowsheet Row Pulmonary Rehab from 02/02/2023 in [facility] Cardiac and Pulmonary Rehab  Date 02/02/23  Educator [facility]  Instruction Review Code 1- Verbalizes Understanding       Education: Exercise Physiology & General Exercise Guidelines: - Group verbal and written instruction with models to review the exercise physiology of the cardiovascular system and associated critical values. Provides general exercise guidelines with specific guidelines to those with  heart or lung disease.    Education: Flexibility, Balance, Mind/Body Relaxation: - Group verbal and visual presentation with interactive activity on the components of exercise prescription. Introduces F.I.T.T principle from ACSM for exercise prescriptions. Reviews F.I.T.T. principles of flexibility and balance exercise training including progression. Also discusses the mind body connection.  Reviews various relaxation techniques to help reduce and manage stress (i.e. Deep breathing, progressive muscle relaxation, and visualization). Balance handout provided to take home. Written material given at graduation.   Activity Barriers & Risk Stratification:  Activity Barriers & Cardiac Risk Stratification - 02/02/23 1739       Activity Barriers & Cardiac Risk Stratification   Activity Barriers Other (comment);Shortness of Breath             6 Minute Walk:  6 Minute Walk     Row Name 02/02/23 1735         6 Minute Walk   Phase Initial     Distance 800 feet     Walk Time 4.5 minutes     # of Rest Breaks 2     MPH 2.02     METS 2.41     RPE 15     Perceived Dyspnea  3     VO2 Peak 8.45     Symptoms Yes (comment)     Comments SOB     Resting HR 64 bpm     Resting BP 138/82     Resting Oxygen Saturation  95 %     Exercise Oxygen Saturation  during 6 min walk 92 %     Max Ex. HR 81 bpm     Max Ex. BP 140/80     2 Minute Post BP 124/78       Interval HR   1 Minute HR 77     2 Minute HR 81     3 Minute HR 71     4 Minute HR 77     5 Minute HR 80     6 Minute HR 94     2 Minute Post HR 68     Interval Heart Rate? Yes  Interval Oxygen   Interval Oxygen? Yes     Baseline Oxygen Saturation % 95 %     1 Minute Oxygen Saturation % 95 %     1 Minute Liters of Oxygen 0 L     2 Minute Oxygen Saturation % 93 %     2 Minute Liters of Oxygen 0 L     3 Minute Oxygen Saturation % 92 %     3 Minute Liters of Oxygen 0 L     4 Minute Oxygen Saturation % 94 %     4 Minute  Liters of Oxygen 0 L     5 Minute Oxygen Saturation % 92 %     5 Minute Liters of Oxygen 0 L     6 Minute Oxygen Saturation % 94 %     6 Minute Liters of Oxygen 0 L     2 Minute Post Oxygen Saturation % 96 %     2 Minute Post Liters of Oxygen 0 L             Oxygen Initial Assessment:  Oxygen Initial Assessment - 02/02/23 1750       Home Oxygen   Home Oxygen Device None    Sleep Oxygen Prescription None    Home Exercise Oxygen Prescription None    Home Resting Oxygen Prescription None      Intervention   Short Term Goals To learn and understand importance of maintaining oxygen saturations>88%;To learn and demonstrate proper pursed lip breathing techniques or other breathing techniques. ;To learn and demonstrate proper use of respiratory medications    Long  Term Goals Maintenance of O2 saturations>88%;Exhibits proper breathing techniques, such as pursed lip breathing or other method taught during program session;Compliance with respiratory medication;Demonstrates proper use of MDI's             Oxygen Re-Evaluation:   Oxygen Discharge (Final Oxygen Re-Evaluation):   Initial Exercise Prescription:  Initial Exercise Prescription - 02/02/23 1700       Date of Initial Exercise RX and Referring Provider   Date 02/02/23    Referring Provider Nurum Romeo Apple Erdem      Oxygen   Maintain Oxygen Saturation 88% or higher      T5 Nustep   Level 1    SPM 80    Minutes 15    METs 2.41      Track   Laps 12    Minutes 15    METs 1.65      Prescription Details   Frequency (times per week) 3    Duration Progress to 30 minutes of continuous aerobic without signs/symptoms of physical distress      Intensity   THRR 40-80% of Max Heartrate 113-162    Ratings of Perceived Exertion 11-13    Perceived Dyspnea 0-4      Progression   Progression Continue to progress workloads to maintain intensity without signs/symptoms of physical distress.      Resistance Training    Training Prescription Yes    Weight 3    Reps 10-15             Perform Capillary Blood Glucose checks as needed.  Exercise Prescription Changes:   Exercise Prescription Changes     Row Name 02/02/23 1700             Response to Exercise   Blood Pressure (Admit) 138/82       Blood Pressure (Exercise) 140/80  Blood Pressure (Exit) 124/78       Heart Rate (Admit) 64 bpm       Heart Rate (Exercise) 81 bpm       Heart Rate (Exit) 68 bpm       Oxygen Saturation (Admit) 95 %       Oxygen Saturation (Exercise) 92 %       Oxygen Saturation (Exit) 96 %       Rating of Perceived Exertion (Exercise) 15       Perceived Dyspnea (Exercise) 3       Symptoms SOB       Comments 6 MWT results                Exercise Comments:   Exercise Goals and Review:   Exercise Goals     Row Name 02/02/23 1744             Exercise Goals   Increase Physical Activity Yes       Intervention Provide advice, education, support and counseling about physical activity/exercise needs.;Develop an individualized exercise prescription for aerobic and resistive training based on initial evaluation findings, risk stratification, comorbidities and participant's personal goals.       Expected Outcomes Short Term: Attend rehab on a regular basis to increase amount of physical activity.;Long Term: Add in home exercise to make exercise part of routine and to increase amount of physical activity.;Long Term: Exercising regularly at least 3-5 days a week.       Increase Strength and Stamina Yes       Intervention Provide advice, education, support and counseling about physical activity/exercise needs.;Develop an individualized exercise prescription for aerobic and resistive training based on initial evaluation findings, risk stratification, comorbidities and participant's personal goals.       Expected Outcomes Short Term: Increase workloads from initial exercise prescription for resistance, speed, and  METs.;Short Term: Perform resistance training exercises routinely during rehab and add in resistance training at home;Long Term: Improve cardiorespiratory fitness, muscular endurance and strength as measured by increased METs and functional capacity (6MWT)       Able to understand and use rate of perceived exertion (RPE) scale Yes       Intervention Provide education and explanation on how to use RPE scale       Expected Outcomes Short Term: Able to use RPE daily in rehab to express subjective intensity level;Long Term:  Able to use RPE to guide intensity level when exercising independently       Able to understand and use Dyspnea scale Yes       Intervention Provide education and explanation on how to use Dyspnea scale       Expected Outcomes Short Term: Able to use Dyspnea scale daily in rehab to express subjective sense of shortness of breath during exertion;Long Term: Able to use Dyspnea scale to guide intensity level when exercising independently       Knowledge and understanding of Target Heart Rate Range (THRR) Yes       Intervention Provide education and explanation of THRR including how the numbers were predicted and where they are located for reference       Expected Outcomes Short Term: Able to state/look up THRR;Long Term: Able to use THRR to govern intensity when exercising independently;Short Term: Able to use daily as guideline for intensity in rehab       Able to check pulse independently Yes       Intervention Provide education and demonstration on  how to check pulse in carotid and radial arteries.;Review the importance of being able to check your own pulse for safety during independent exercise       Expected Outcomes Short Term: Able to explain why pulse checking is important during independent exercise       Understanding of Exercise Prescription Yes       Intervention Provide education, explanation, and written materials on patient's individual exercise prescription       Expected  Outcomes Short Term: Able to explain program exercise prescription;Long Term: Able to explain home exercise prescription to exercise independently                Exercise Goals Re-Evaluation :   Discharge Exercise Prescription (Final Exercise Prescription Changes):  Exercise Prescription Changes - 02/02/23 1700       Response to Exercise   Blood Pressure (Admit) 138/82    Blood Pressure (Exercise) 140/80    Blood Pressure (Exit) 124/78    Heart Rate (Admit) 64 bpm    Heart Rate (Exercise) 81 bpm    Heart Rate (Exit) 68 bpm    Oxygen Saturation (Admit) 95 %    Oxygen Saturation (Exercise) 92 %    Oxygen Saturation (Exit) 96 %    Rating of Perceived Exertion (Exercise) 15    Perceived Dyspnea (Exercise) 3    Symptoms SOB    Comments 6 MWT results             Nutrition:  Target Goals: Understanding of nutrition guidelines, daily intake of sodium 1500mg , cholesterol 200mg , calories 30% from fat and 7% or less from saturated fats, daily to have 5 or more servings of fruits and vegetables.  Education: All About Nutrition: -Group instruction provided by verbal, written material, interactive activities, discussions, models, and posters to present general guidelines for heart healthy nutrition including fat, fiber, MyPlate, the role of sodium in heart healthy nutrition, utilization of the nutrition label, and utilization of this knowledge for meal planning. Follow up email sent as well. Written material given at graduation.   Biometrics:  Pre Biometrics - 02/02/23 1745       Pre Biometrics   Height 6' (1.829 m)    Weight 376 lb 8 oz (170.8 kg)    Waist Circumference 60 inches    Hip Circumference 60 inches    Waist to Hip Ratio 1 %    BMI (Calculated) 51.05    Single Leg Stand 30 seconds              Nutrition Therapy Plan and Nutrition Goals:  Nutrition Therapy & Goals - 02/02/23 1748       Intervention Plan   Intervention Prescribe, educate and counsel  regarding individualized specific dietary modifications aiming towards targeted core components such as weight, hypertension, lipid management, diabetes, heart failure and other comorbidities.    Expected Outcomes Short Term Goal: A plan has been developed with personal nutrition goals set during dietitian appointment.;Short Term Goal: Understand basic principles of dietary content, such as calories, fat, sodium, cholesterol and nutrients.;Long Term Goal: Adherence to prescribed nutrition plan.             Nutrition Assessments:  MEDIFICTS Score Key: ?70 Need to make dietary changes  40-70 Heart Healthy Diet ? 40 Therapeutic Level Cholesterol Diet  Flowsheet Row Pulmonary Rehab from 02/02/2023 in [facility] Cardiac and Pulmonary Rehab  Picture Your Plate Total Score on Admission 61      Picture Your Plate Scores: [id number] Unhealthy dietary  pattern with much room for improvement. 41-50 Dietary pattern unlikely to meet recommendations for good health and room for improvement. 51-60 More healthful dietary pattern, with some room for improvement.  >60 Healthy dietary pattern, although there may be some specific behaviors that could be improved.   Nutrition Goals Re-Evaluation:   Nutrition Goals Discharge (Final Nutrition Goals Re-Evaluation):   Psychosocial: Target Goals: Acknowledge presence or absence of significant depression and/or stress, maximize coping skills, provide positive support system. Participant is able to verbalize types and ability to use techniques and skills needed for reducing stress and depression.   Education: Stress, Anxiety, and Depression - Group verbal and visual presentation to define topics covered.  Reviews how body is impacted by stress, anxiety, and depression.  Also discusses healthy ways to reduce stress and to treat/manage anxiety and depression.  Written material given at graduation.   Education: Sleep Hygiene -Provides group verbal and written instruction  about how sleep can affect your health.  Define sleep hygiene, discuss sleep cycles and impact of sleep habits. Review good sleep hygiene tips.    Initial Review & Psychosocial Screening:  Initial Psych Review & Screening - 01/28/23 1018       Initial Review   Current issues with Current Psychotropic Meds;Current Anxiety/Panic   bi polar  taking meds, has therapist on going     Pink? Yes   mom     Barriers   Psychosocial barriers to participate in program There are no identifiable barriers or psychosocial needs.      Screening Interventions   Interventions Encouraged to exercise;To provide support and resources with identified psychosocial needs;Provide feedback about the scores to participant    Expected Outcomes Short Term goal: Utilizing psychosocial counselor, staff and physician to assist with identification of specific Stressors or current issues interfering with healing process. Setting desired goal for each stressor or current issue identified.;Long Term Goal: Stressors or current issues are controlled or eliminated.;Short Term goal: Identification and review with participant of any Quality of Life or Depression concerns found by scoring the questionnaire.;Long Term goal: The participant improves quality of Life and PHQ9 Scores as seen by post scores and/or verbalization of changes             Quality of Life Scores:  Scores of 19 and below usually indicate a poorer quality of life in these areas.  A difference of  2-3 points is a clinically meaningful difference.  A difference of 2-3 points in the total score of the Quality of Life Index has been associated with significant improvement in overall quality of life, self-image, physical symptoms, and general health in studies assessing change in quality of life.  PHQ-9: Review Flowsheet  More data exists      02/02/2023 06/30/2022 04/03/2022 12/31/2021 10/31/2021  Depression screen PHQ 2/9   Decreased Interest 1      Down, Depressed, Hopeless 1      PHQ - 2 Score 2      Altered sleeping 3 - - -   Tired, decreased energy 3 - - -   Change in appetite 3 - - -   Feeling bad or failure about yourself  3 - - -   Trouble concentrating 1 - - -   Moving slowly or fidgety/restless 2 - - -   Suicidal thoughts 0 - - -   PHQ-9 Score 17 - - -   Difficult doing work/chores Very difficult - - -  Details       Information is confidential and restricted. Go to Review Flowsheets to unlock data.        Interpretation of Total Score  Total Score Depression Severity:  1-4 = Minimal depression, 5-9 = Mild depression, 10-14 = Moderate depression, 15-19 = Moderately severe depression, 20-27 = Severe depression   Psychosocial Evaluation and Intervention:  Psychosocial Evaluation - 01/28/23 1037       Psychosocial Evaluation & Interventions   Interventions Encouraged to exercise with the program and follow exercise prescription    Comments Cynthia Lang has no barriers to attending the program. Cynthia Lang lives with her Mom and her Mom is her support.  Cynthia Lang has 4 dogs and 1 cat at home. Cynthia Lang is diagnosed as BiPolar and is on meds and sees a threapist and psychiatrist to manage her symptoms. Cynthia Lang is working on  medical clearance to be able to have gastric bypass surgery.    Cynthia Lang is ready to get started and see if Cynthia Lang can improve her stamina and strength and prepare for her surgery.    Expected Outcomes STG Cynthia Lang attends all scheduled sessions, Cynthia Lang is able to progress her exercise.  Cynthia Lang continues to [facility] her Bipolar symptoms LTG Cynthia Lang is able to continue with her exercise progression and manage her Bipolar disease.    Continue Psychosocial Services  Follow up required by staff             Psychosocial Re-Evaluation:   Psychosocial Discharge (Final Psychosocial Re-Evaluation):   Education: Education Goals: Education classes will be provided on a weekly basis, covering required topics.  Participant will state understanding/return demonstration of topics presented.  Learning Barriers/Preferences:  Learning Barriers/Preferences - 01/28/23 1023       Learning Barriers/Preferences   Learning Barriers Hearing    Learning Preferences None             General Pulmonary Education Topics:  Infection Prevention: - Provides verbal and written material to individual with discussion of infection control including proper hand washing and proper equipment cleaning during exercise session. Flowsheet Row Pulmonary Rehab from 02/02/2023 in [facility] Cardiac and Pulmonary Rehab  Date 02/02/23  Educator [facility]  Instruction Review Code 1- Verbalizes Understanding       Falls Prevention: - Provides verbal and written material to individual with discussion of falls prevention and safety. Flowsheet Row Pulmonary Rehab from 02/02/2023 in [facility] Cardiac and Pulmonary Rehab  Date 02/02/23  Educator [facility]  Instruction Review Code 1- Verbalizes Understanding       Chronic Lung Disease Review: - Group verbal instruction with posters, models, PowerPoint presentations and videos,  to review new updates, new respiratory medications, new advancements in procedures and treatments. Providing information on websites and "800" numbers for continued self-education. Includes information about supplement oxygen, available portable oxygen systems, continuous and intermittent flow rates, oxygen safety, concentrators, and Medicare reimbursement for oxygen. Explanation of Pulmonary Drugs, including class, frequency, complications, importance of spacers, rinsing mouth after steroid MDI's, and proper cleaning methods for nebulizers. Review of basic lung anatomy and physiology related to function, structure, and complications of lung disease. Review of risk factors. Discussion about methods for diagnosing sleep apnea and types of masks and machines for [name]. Includes a review of the use of types of environmental controls:  home humidity, furnaces, filters, dust mite/pet prevention, HEPA vacuums. Discussion about weather changes, air quality and the benefits of nasal washing. Instruction on Warning signs, infection symptoms, calling MD promptly, preventive modes, and value of vaccinations. Review  of effective airway clearance, coughing and/or vibration techniques. Emphasizing that all should Create an Action Plan. Written material given at graduation. Flowsheet Row Pulmonary Rehab from 02/02/2023 in [facility] Cardiac and Pulmonary Rehab  Education need identified 02/02/23       AED/CPR: - Group verbal and written instruction with the use of models to demonstrate the basic use of the AED with the basic ABC's of resuscitation.    Anatomy and Cardiac Procedures: - Group verbal and visual presentation and models provide information about basic cardiac anatomy and function. Reviews the testing methods done to diagnose heart disease and the outcomes of the test results. Describes the treatment choices: Medical Management, Angioplasty, or Coronary Bypass Surgery for treating various heart conditions including Myocardial Infarction, Angina, Valve Disease, and Cardiac Arrhythmias.  Written material given at graduation.   Medication Safety: - Group verbal and visual instruction to review commonly prescribed medications for heart and lung disease. Reviews the medication, class of the drug, and side effects. Includes the steps to properly store meds and maintain the prescription regimen.  Written material given at graduation.   Other: -Provides group and verbal instruction on various topics (see comments)   Knowledge Questionnaire Score:  Knowledge Questionnaire Score - 02/02/23 1750       Knowledge Questionnaire Score   Pre Score 14/18              Core Components/Risk Factors/Patient Goals at Admission:  Personal Goals and Risk Factors at Admission - 02/02/23 1748       Core Components/Risk Factors/Patient  Goals on Admission    Weight Management Yes;Obesity    Intervention Weight Management: Develop a combined nutrition and exercise program designed to reach desired caloric intake, while maintaining appropriate intake of nutrient and fiber, sodium and fats, and appropriate energy expenditure required for the weight goal.;Weight Management: Provide education and appropriate resources to help participant work on and attain dietary goals.;Weight Management/Obesity: Establish reasonable short term and long term weight goals.;Obesity: Provide education and appropriate resources to help participant work on and attain dietary goals.    Admit Weight 376 lb 8 oz (170.8 kg)    Goal Weight: Short Term 370 lb (167.8 kg)    Goal Weight: Long Term 340 lb (154.2 kg)    Expected Outcomes Short Term: Continue to assess and modify interventions until short term weight is achieved;Long Term: Adherence to nutrition and physical activity/exercise program aimed toward attainment of established weight goal;Weight Loss: Understanding of general recommendations for a balanced deficit meal plan, which promotes 1-2 lb weight loss per week and includes a negative energy balance of [id number] kcal/d;Understanding recommendations for meals to include 15-35% energy as protein, 25-35% energy from fat, 35-60% energy from carbohydrates, less than 200mg  of dietary cholesterol, 20-35 gm of total fiber daily;Understanding of distribution of calorie intake throughout the day with the consumption of 4-5 meals/snacks    Improve shortness of breath with ADL's Yes    Intervention Provide education, individualized exercise plan and daily activity instruction to help decrease symptoms of SOB with activities of daily living.    Expected Outcomes Short Term: Improve cardiorespiratory fitness to achieve a reduction of symptoms when performing ADLs;Long Term: Be able to perform more ADLs without symptoms or delay the onset of symptoms    Increase knowledge  of respiratory medications and ability to use respiratory devices properly  Yes    Intervention Provide education and demonstration as needed of appropriate use of medications, inhalers, and oxygen therapy.  Expected Outcomes Short Term: Achieves understanding of medications use. Understands that oxygen is a medication prescribed by physician. Demonstrates appropriate use of inhaler and oxygen therapy.;Long Term: Maintain appropriate use of medications, inhalers, and oxygen therapy.    Diabetes Yes    Intervention Provide education about signs/symptoms and action to take for hypo/hyperglycemia.;Provide education about proper nutrition, including hydration, and aerobic/resistive exercise prescription along with prescribed medications to achieve blood glucose in normal ranges: Fasting glucose 65-99 mg/dL    Expected Outcomes Short Term: Participant verbalizes understanding of the signs/symptoms and immediate care of hyper/hypoglycemia, proper foot care and importance of medication, aerobic/resistive exercise and nutrition plan for blood glucose control.;Long Term: Attainment of HbA1C < 7%.    Hypertension Yes    Intervention Provide education on lifestyle modifcations including regular physical activity/exercise, weight management, moderate sodium restriction and increased consumption of fresh fruit, vegetables, and low fat dairy, alcohol moderation, and smoking cessation.;Monitor prescription use compliance.    Expected Outcomes Short Term: Continued assessment and intervention until BP is < 140/44mm HG in hypertensive participants. < 130/46mm HG in hypertensive participants with diabetes, heart failure or chronic kidney disease.;Long Term: Maintenance of blood pressure at goal levels.             Education:Diabetes - Individual verbal and written instruction to review signs/symptoms of diabetes, desired ranges of glucose level fasting, after meals and with exercise. Acknowledge that pre and post  exercise glucose checks will be done for 3 sessions at entry of program. Flowsheet Row Pulmonary Rehab from 02/02/2023 in [facility] Cardiac and Pulmonary Rehab  Date 02/02/23  Educator [facility]  Instruction Review Code 1- Verbalizes Understanding       Know Your Numbers and Heart Failure: - Group verbal and visual instruction to discuss disease risk factors for cardiac and pulmonary disease and treatment options.  Reviews associated critical values for Overweight/Obesity, Hypertension, Cholesterol, and Diabetes.  Discusses basics of heart failure: signs/symptoms and treatments.  Introduces Heart Failure Zone chart for action plan for heart failure.  Written material given at graduation.   Core Components/Risk Factors/Patient Goals Review:    Core Components/Risk Factors/Patient Goals at Discharge (Final Review):    ITP Comments:  ITP Comments     Row Name 01/28/23 1036 02/02/23 1734 02/02/23 1756       ITP Comments Virtual orientation call completed today. shehas an appointment on Date: [id number]  for EP eval and gym Orientation.  Documentation of diagnosis can be found in [facility] Date: 01/15/2023 . Completed 6MWT and gym orientation. Initial ITP created and sent for review to Dr. Zetta Bills, Medical Director. Patient reports that her Blood sugars are always in the 400-500 range. A clearance note will be sent to her endocrinologist before starting pulmonary rehab since patient states Cynthia Lang will never be able to get at BG below 300, which is our program guideline. 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              Comments: initial ITP

## 2023-02-04 ENCOUNTER — Encounter: Payer: Self-pay | Admitting: *Deleted

## 2023-02-04 DIAGNOSIS — I272 Pulmonary hypertension, unspecified: Secondary | ICD-10-CM

## 2023-02-04 DIAGNOSIS — J984 Other disorders of lung: Secondary | ICD-10-CM

## 2023-02-04 NOTE — Unmapped (Signed)
Patient call nurse line stating need letter written by the provider to state patient blood sugar level can be high. This letter for patient to enter a rehab program for pulmonary. Patient has also sent a my chart message on 02/02/2023 Message forward to provider.

## 2023-02-04 NOTE — Progress Notes (Signed)
Pulmonary Individual Treatment Plan  Patient Details  Name: Cynthia Lang MRN: [id number] Date of Birth: 1991/10/30 Referring Provider:   Flowsheet Row Pulmonary Rehab from 02/02/2023 in [facility] Cardiac and Pulmonary Rehab  Referring Provider Nurum Romeo Apple Erdem       Initial Encounter Date:  Flowsheet Row Pulmonary Rehab from 02/02/2023 in [facility] Cardiac and Pulmonary Rehab  Date 02/02/23       Visit Diagnosis: Restrictive lung disease  Pulmonary hypertension ([place])  Patient's Home Medications on Admission:  Current Outpatient Medications:    ACCU-CHEK GUIDE test strip, CHECK SUGARS BEFORE MEALS 3 TIMES DAILY FOR INSULIN DEPENDANT TYPE TWO DIABETES., Disp: , Rfl:    atenolol (TENORMIN) 100 MG tablet, Take 100 mg by mouth daily., Disp: , Rfl:    bosutinib (BOSULIF) 500 MG tablet, Take 500 mg by mouth daily with breakfast. Take with food., Disp: , Rfl:    CALTRATE 600+D3 600-20 MG-MCG TABS, Take 1 tablet by mouth 2 (two) times daily., Disp: , Rfl:    cetirizine (ZYRTEC) 10 MG tablet, Take 10 mg by mouth every morning., Disp: , Rfl:    clindamycin (CLEOCIN T) 1 % SWAB, Apply topically 2 (two) times daily. (Patient not taking: Reported on 01/28/2023), Disp: , Rfl:    clobetasol (TEMOVATE) 0.05 % external solution, Apply topically 2 (two) times daily as needed., Disp: , Rfl:    Continuous Blood Gluc Sensor (DEXCOM G6 SENSOR) MISC, , Disp: , Rfl:    cyclobenzaprine (FLEXERIL) 10 MG tablet, Take by mouth., Disp: , Rfl:    divalproex (DEPAKOTE ER) 500 MG 24 hr tablet, Take 1 tablet (500 mg total) by mouth at bedtime., Disp: 30 tablet, Rfl: 3   ferrous sulfate 325 (65 FE) MG EC tablet, Take by mouth., Disp: , Rfl:    glipiZIDE (GLUCOTROL XL) 10 MG 24 hr tablet, Take 20 mg by mouth daily. (Patient not taking: Reported on 01/28/2023), Disp: , Rfl:    HUMULIN R U-500 KWIKPEN 500 UNIT/ML KwikPen, Inject into the skin., Disp: , Rfl:    hydrochlorothiazide (HYDRODIURIL) 25 MG tablet, Take by mouth.  (Patient not taking: Reported on 01/28/2023), Disp: , Rfl:    hydrocortisone 2.5 % ointment, Apply 1 application topically 2 (two) times daily. (Patient not taking: Reported on 01/28/2023), Disp: , Rfl:    hydrOXYzine (ATARAX) 50 MG tablet, TAKE ONE (1) TO ONE AND ONE-HALF (1+1/2)TABLET BY MOUTH AT BEDTIME AS NEEDED FORANXIETY OR SLEEP, Disp: 45 tablet, Rfl: 2   lamoTRIgine (LAMICTAL) 200 MG tablet, Take 1 tablet (200 mg total) by mouth 2 (two) times daily., Disp: 60 tablet, Rfl: 3   metFORMIN (GLUCOPHAGE) 1000 MG tablet, Take by mouth., Disp: , Rfl:    MOUNJARO 2.5 MG/0.5ML Pen, SMARTSIG:0.5 Milliliter(s) SUB-Q Once a Week, Disp: , Rfl:    norethindrone (AYGESTIN) 5 MG tablet, Take 5 mg by mouth every morning., Disp: , Rfl:    NOVOFINE PEN NEEDLE 32G X 6 MM MISC, Inject into the skin 3 (three) times daily. (Patient not taking: Reported on 01/28/2023), Disp: , Rfl:    pantoprazole (PROTONIX) 40 MG tablet, Take 40 mg by mouth every morning., Disp: , Rfl:    pregabalin (LYRICA) 75 MG capsule, TAKE 1 CAPSULE BY MOUTH EVERY MORNING and 2 EVERY EVENING, Disp: , Rfl:    torsemide (DEMADEX) 20 MG tablet, Take 20 mg by mouth every other day., Disp: , Rfl:    traZODone (DESYREL) 100 MG tablet, Take 2 tablets (200 mg total) by mouth at bedtime., Disp:  60 tablet, Rfl: 3   TRESIBA FLEXTOUCH 200 UNIT/ML FlexTouch Pen, Inject into the skin. (Patient not taking: Reported on 01/28/2023), Disp: , Rfl:    triamcinolone ointment (KENALOG) 0.1 %, Apply topically., Disp: , Rfl:    valsartan (DIOVAN) 160 MG tablet, Take by mouth., Disp: , Rfl:    VENTOLIN HFA 108 (90 Base) MCG/ACT inhaler, Inhale 2 puffs into the lungs every 6 (six) hours as needed., Disp: , Rfl:    VICTOZA 18 MG/3ML SOPN, SMARTSIG:0.3 Milliliter(s) SUB-Q Daily (Patient not taking: Reported on 01/28/2023), Disp: , Rfl:    ziprasidone (GEODON) 80 MG capsule, TAKE 1 CAPSULE BY MOUTH TWICE DAILY WITHFOOD., Disp: 60 capsule, Rfl: 3  Past Medical History: Past  Medical History:  Diagnosis Date   Depression    Diabetes mellitus, type II ([place])    Hypertension    Leukemia, 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   PCOS (polycystic ovarian syndrome)    PTSD (post-traumatic stress disorder)     Tobacco Use: Social History   Tobacco Use  Smoking Status Former   Packs/day: 0.50   Years: 10.00   Additional pack years: 0.00   Total pack years: 5.00   Types: Cigarettes   Quit date: 2014   Years since quitting: 10.2  Smokeless Tobacco Never    Labs: Review Flowsheet       Latest Ref Rng & Units 10/18/2021  Labs for ITP Cardiac and Pulmonary Rehab  Cholestrol 0 - 200 mg/dL 165   LDL (calc) 0 - 99 mg/dL 98   HDL-C >40 mg/dL 36   Trlycerides <150 mg/dL 156      Pulmonary Assessment Scores:  Pulmonary Assessment Scores     Row Name 02/02/23 1750         ADL UCSD   ADL Phase Entry     SOB Score total 90     Rest 1     Walk 3     Stairs 5     Bath 4     Dress 3     Shop 5       CAT Score   CAT Score 21       mMRC Score   mMRC Score 3              UCSD: Self-administered rating of dyspnea associated with activities of daily living (ADLs) 6-point scale (0 = "not at all" to 5 = "maximal or unable to do because of breathlessness")  Scoring Scores range from 0 to 120.  Minimally important difference is 5 units  CAT: CAT can identify the health impairment of COPD patients and is better correlated with disease progression.  CAT has a scoring range of zero to 40. The CAT score is classified into four groups of low (less than 10), medium (10 - 20), high (21-30) and very high (31-40) based on the impact level of disease on health status. A CAT score over 10 suggests significant symptoms.  A worsening CAT score could be explained by an exacerbation, poor medication adherence, poor inhaler technique, or progression of COPD or comorbid conditions.  CAT MCID is 2 points  mMRC: mMRC (Modified Medical Research Council) Dyspnea Scale is  used to assess the degree of baseline functional disability in patients of respiratory disease due to dyspnea. No minimal important difference is established. A decrease in score of 1 point or greater is considered a positive change.   Pulmonary Function Assessment:   Exercise Target Goals: Exercise Program Goal: Individual exercise prescription  set using results from initial 6 min walk test and THRR while considering  patient's activity barriers and safety.   Exercise Prescription Goal: Initial exercise prescription builds to 30-45 minutes a day of aerobic activity, 2-3 days per week.  Home exercise guidelines will be given to patient during program as part of exercise prescription that the participant will acknowledge.  Education: Aerobic Exercise: - Group verbal and visual presentation on the components of exercise prescription. Introduces F.I.T.T principle from ACSM for exercise prescriptions.  Reviews F.I.T.T. principles of aerobic exercise including progression. Written material given at graduation.   Education: Resistance Exercise: - Group verbal and visual presentation on the components of exercise prescription. Introduces F.I.T.T principle from ACSM for exercise prescriptions  Reviews F.I.T.T. principles of resistance exercise including progression. Written material given at graduation.    Education: Exercise & Equipment Safety: - Individual verbal instruction and demonstration of equipment use and safety with use of the equipment. Flowsheet Row Pulmonary Rehab from 02/02/2023 in [facility] Cardiac and Pulmonary Rehab  Date 02/02/23  Educator [facility]  Instruction Review Code 1- Verbalizes Understanding       Education: Exercise Physiology & General Exercise Guidelines: - Group verbal and written instruction with models to review the exercise physiology of the cardiovascular system and associated critical values. Provides general exercise guidelines with specific guidelines to those with  heart or lung disease.    Education: Flexibility, Balance, Mind/Body Relaxation: - Group verbal and visual presentation with interactive activity on the components of exercise prescription. Introduces F.I.T.T principle from ACSM for exercise prescriptions. Reviews F.I.T.T. principles of flexibility and balance exercise training including progression. Also discusses the mind body connection.  Reviews various relaxation techniques to help reduce and manage stress (i.e. Deep breathing, progressive muscle relaxation, and visualization). Balance handout provided to take home. Written material given at graduation.   Activity Barriers & Risk Stratification:  Activity Barriers & Cardiac Risk Stratification - 02/02/23 1739       Activity Barriers & Cardiac Risk Stratification   Activity Barriers Other (comment);Shortness of Breath             6 Minute Walk:  6 Minute Walk     Row Name 02/02/23 1735         6 Minute Walk   Phase Initial     Distance 800 feet     Walk Time 4.5 minutes     # of Rest Breaks 2     MPH 2.02     METS 2.41     RPE 15     Perceived Dyspnea  3     VO2 Peak 8.45     Symptoms Yes (comment)     Comments SOB     Resting HR 64 bpm     Resting BP 138/82     Resting Oxygen Saturation  95 %     Exercise Oxygen Saturation  during 6 min walk 92 %     Max Ex. HR 81 bpm     Max Ex. BP 140/80     2 Minute Post BP 124/78       Interval HR   1 Minute HR 77     2 Minute HR 81     3 Minute HR 71     4 Minute HR 77     5 Minute HR 80     6 Minute HR 94     2 Minute Post HR 68     Interval Heart Rate? Yes  Interval Oxygen   Interval Oxygen? Yes     Baseline Oxygen Saturation % 95 %     1 Minute Oxygen Saturation % 95 %     1 Minute Liters of Oxygen 0 L     2 Minute Oxygen Saturation % 93 %     2 Minute Liters of Oxygen 0 L     3 Minute Oxygen Saturation % 92 %     3 Minute Liters of Oxygen 0 L     4 Minute Oxygen Saturation % 94 %     4 Minute  Liters of Oxygen 0 L     5 Minute Oxygen Saturation % 92 %     5 Minute Liters of Oxygen 0 L     6 Minute Oxygen Saturation % 94 %     6 Minute Liters of Oxygen 0 L     2 Minute Post Oxygen Saturation % 96 %     2 Minute Post Liters of Oxygen 0 L             Oxygen Initial Assessment:  Oxygen Initial Assessment - 02/02/23 1750       Home Oxygen   Home Oxygen Device None    Sleep Oxygen Prescription None    Home Exercise Oxygen Prescription None    Home Resting Oxygen Prescription None      Intervention   Short Term Goals To learn and understand importance of maintaining oxygen saturations>88%;To learn and demonstrate proper pursed lip breathing techniques or other breathing techniques. ;To learn and demonstrate proper use of respiratory medications    Long  Term Goals Maintenance of O2 saturations>88%;Exhibits proper breathing techniques, such as pursed lip breathing or other method taught during program session;Compliance with respiratory medication;Demonstrates proper use of MDI's             Oxygen Re-Evaluation:   Oxygen Discharge (Final Oxygen Re-Evaluation):   Initial Exercise Prescription:  Initial Exercise Prescription - 02/02/23 1700       Date of Initial Exercise RX and Referring Provider   Date 02/02/23    Referring Provider Nurum Romeo Apple Erdem      Oxygen   Maintain Oxygen Saturation 88% or higher      T5 Nustep   Level 1    SPM 80    Minutes 15    METs 2.41      Track   Laps 12    Minutes 15    METs 1.65      Prescription Details   Frequency (times per week) 3    Duration Progress to 30 minutes of continuous aerobic without signs/symptoms of physical distress      Intensity   THRR 40-80% of Max Heartrate 113-162    Ratings of Perceived Exertion 11-13    Perceived Dyspnea 0-4      Progression   Progression Continue to progress workloads to maintain intensity without signs/symptoms of physical distress.      Resistance Training    Training Prescription Yes    Weight 3    Reps 10-15             Perform Capillary Blood Glucose checks as needed.  Exercise Prescription Changes:   Exercise Prescription Changes     Row Name 02/02/23 1700             Response to Exercise   Blood Pressure (Admit) 138/82       Blood Pressure (Exercise) 140/80  Blood Pressure (Exit) 124/78       Heart Rate (Admit) 64 bpm       Heart Rate (Exercise) 81 bpm       Heart Rate (Exit) 68 bpm       Oxygen Saturation (Admit) 95 %       Oxygen Saturation (Exercise) 92 %       Oxygen Saturation (Exit) 96 %       Rating of Perceived Exertion (Exercise) 15       Perceived Dyspnea (Exercise) 3       Symptoms SOB       Comments 6 MWT results                Exercise Comments:   Exercise Goals and Review:   Exercise Goals     Row Name 02/02/23 1744             Exercise Goals   Increase Physical Activity Yes       Intervention Provide advice, education, support and counseling about physical activity/exercise needs.;Develop an individualized exercise prescription for aerobic and resistive training based on initial evaluation findings, risk stratification, comorbidities and participant's personal goals.       Expected Outcomes Short Term: Attend rehab on a regular basis to increase amount of physical activity.;Long Term: Add in home exercise to make exercise part of routine and to increase amount of physical activity.;Long Term: Exercising regularly at least 3-5 days a week.       Increase Strength and Stamina Yes       Intervention Provide advice, education, support and counseling about physical activity/exercise needs.;Develop an individualized exercise prescription for aerobic and resistive training based on initial evaluation findings, risk stratification, comorbidities and participant's personal goals.       Expected Outcomes Short Term: Increase workloads from initial exercise prescription for resistance, speed, and  METs.;Short Term: Perform resistance training exercises routinely during rehab and add in resistance training at home;Long Term: Improve cardiorespiratory fitness, muscular endurance and strength as measured by increased METs and functional capacity (6MWT)       Able to understand and use rate of perceived exertion (RPE) scale Yes       Intervention Provide education and explanation on how to use RPE scale       Expected Outcomes Short Term: Able to use RPE daily in rehab to express subjective intensity level;Long Term:  Able to use RPE to guide intensity level when exercising independently       Able to understand and use Dyspnea scale Yes       Intervention Provide education and explanation on how to use Dyspnea scale       Expected Outcomes Short Term: Able to use Dyspnea scale daily in rehab to express subjective sense of shortness of breath during exertion;Long Term: Able to use Dyspnea scale to guide intensity level when exercising independently       Knowledge and understanding of Target Heart Rate Range (THRR) Yes       Intervention Provide education and explanation of THRR including how the numbers were predicted and where they are located for reference       Expected Outcomes Short Term: Able to state/look up THRR;Long Term: Able to use THRR to govern intensity when exercising independently;Short Term: Able to use daily as guideline for intensity in rehab       Able to check pulse independently Yes       Intervention Provide education and demonstration on  how to check pulse in carotid and radial arteries.;Review the importance of being able to check your own pulse for safety during independent exercise       Expected Outcomes Short Term: Able to explain why pulse checking is important during independent exercise       Understanding of Exercise Prescription Yes       Intervention Provide education, explanation, and written materials on patient's individual exercise prescription       Expected  Outcomes Short Term: Able to explain program exercise prescription;Long Term: Able to explain home exercise prescription to exercise independently                Exercise Goals Re-Evaluation :   Discharge Exercise Prescription (Final Exercise Prescription Changes):  Exercise Prescription Changes - 02/02/23 1700       Response to Exercise   Blood Pressure (Admit) 138/82    Blood Pressure (Exercise) 140/80    Blood Pressure (Exit) 124/78    Heart Rate (Admit) 64 bpm    Heart Rate (Exercise) 81 bpm    Heart Rate (Exit) 68 bpm    Oxygen Saturation (Admit) 95 %    Oxygen Saturation (Exercise) 92 %    Oxygen Saturation (Exit) 96 %    Rating of Perceived Exertion (Exercise) 15    Perceived Dyspnea (Exercise) 3    Symptoms SOB    Comments 6 MWT results             Nutrition:  Target Goals: Understanding of nutrition guidelines, daily intake of sodium 1500mg , cholesterol 200mg , calories 30% from fat and 7% or less from saturated fats, daily to have 5 or more servings of fruits and vegetables.  Education: All About Nutrition: -Group instruction provided by verbal, written material, interactive activities, discussions, models, and posters to present general guidelines for heart healthy nutrition including fat, fiber, MyPlate, the role of sodium in heart healthy nutrition, utilization of the nutrition label, and utilization of this knowledge for meal planning. Follow up email sent as well. Written material given at graduation.   Biometrics:  Pre Biometrics - 02/02/23 1745       Pre Biometrics   Height 6' (1.829 m)    Weight 376 lb 8 oz (170.8 kg)    Waist Circumference 60 inches    Hip Circumference 60 inches    Waist to Hip Ratio 1 %    BMI (Calculated) 51.05    Single Leg Stand 30 seconds              Nutrition Therapy Plan and Nutrition Goals:  Nutrition Therapy & Goals - 02/02/23 1748       Intervention Plan   Intervention Prescribe, educate and counsel  regarding individualized specific dietary modifications aiming towards targeted core components such as weight, hypertension, lipid management, diabetes, heart failure and other comorbidities.    Expected Outcomes Short Term Goal: A plan has been developed with personal nutrition goals set during dietitian appointment.;Short Term Goal: Understand basic principles of dietary content, such as calories, fat, sodium, cholesterol and nutrients.;Long Term Goal: Adherence to prescribed nutrition plan.             Nutrition Assessments:  MEDIFICTS Score Key: ?70 Need to make dietary changes  40-70 Heart Healthy Diet ? 40 Therapeutic Level Cholesterol Diet  Flowsheet Row Pulmonary Rehab from 02/02/2023 in [facility] Cardiac and Pulmonary Rehab  Picture Your Plate Total Score on Admission 61      Picture Your Plate Scores: [id number] Unhealthy dietary  pattern with much room for improvement. 41-50 Dietary pattern unlikely to meet recommendations for good health and room for improvement. 51-60 More healthful dietary pattern, with some room for improvement.  >60 Healthy dietary pattern, although there may be some specific behaviors that could be improved.   Nutrition Goals Re-Evaluation:   Nutrition Goals Discharge (Final Nutrition Goals Re-Evaluation):   Psychosocial: Target Goals: Acknowledge presence or absence of significant depression and/or stress, maximize coping skills, provide positive support system. Participant is able to verbalize types and ability to use techniques and skills needed for reducing stress and depression.   Education: Stress, Anxiety, and Depression - Group verbal and visual presentation to define topics covered.  Reviews how body is impacted by stress, anxiety, and depression.  Also discusses healthy ways to reduce stress and to treat/manage anxiety and depression.  Written material given at graduation.   Education: Sleep Hygiene -Provides group verbal and written instruction  about how sleep can affect your health.  Define sleep hygiene, discuss sleep cycles and impact of sleep habits. Review good sleep hygiene tips.    Initial Review & Psychosocial Screening:  Initial Psych Review & Screening - 01/28/23 1018       Initial Review   Current issues with Current Psychotropic Meds;Current Anxiety/Panic   bi polar  taking meds, has therapist on going     [place]? Yes   mom     Barriers   Psychosocial barriers to participate in program There are no identifiable barriers or psychosocial needs.      Screening Interventions   Interventions Encouraged to exercise;To provide support and resources with identified psychosocial needs;Provide feedback about the scores to participant    Expected Outcomes Short Term goal: Utilizing psychosocial counselor, staff and physician to assist with identification of specific Stressors or current issues interfering with healing process. Setting desired goal for each stressor or current issue identified.;Long Term Goal: Stressors or current issues are controlled or eliminated.;Short Term goal: Identification and review with participant of any Quality of Life or Depression concerns found by scoring the questionnaire.;Long Term goal: The participant improves quality of Life and PHQ9 Scores as seen by post scores and/or verbalization of changes             Quality of Life Scores:  Scores of 19 and below usually indicate a poorer quality of life in these areas.  A difference of  2-3 points is a clinically meaningful difference.  A difference of 2-3 points in the total score of the Quality of Life Index has been associated with significant improvement in overall quality of life, self-image, physical symptoms, and general health in studies assessing change in quality of life.  PHQ-9: Review Flowsheet  More data exists      02/02/2023 06/30/2022 04/03/2022 12/31/2021 10/31/2021  Depression screen PHQ 2/9   Decreased Interest 1      Down, Depressed, Hopeless 1      PHQ - 2 Score 2      Altered sleeping 3 - - -   Tired, decreased energy 3 - - -   Change in appetite 3 - - -   Feeling bad or failure about yourself  3 - - -   Trouble concentrating 1 - - -   Moving slowly or fidgety/restless 2 - - -   Suicidal thoughts 0 - - -   PHQ-9 Score 17 - - -   Difficult doing work/chores Very difficult - - -  Details       Information is confidential and restricted. Go to Review Flowsheets to unlock data.        Interpretation of Total Score  Total Score Depression Severity:  1-4 = Minimal depression, 5-9 = Mild depression, 10-14 = Moderate depression, 15-19 = Moderately severe depression, 20-27 = Severe depression   Psychosocial Evaluation and Intervention:  Psychosocial Evaluation - 01/28/23 1037       Psychosocial Evaluation & Interventions   Interventions Encouraged to exercise with the program and follow exercise prescription    Comments Aniza has no barriers to attending the program. She lives with her Mom and her Mom is her support.  She has 4 dogs and 1 cat at home. She is diagnosed as BiPolar and is on meds and sees a threapist and psychiatrist to manage her symptoms. She is working on  medical clearance to be able to have gastric bypass surgery.    She is ready to get started and see if she can improve her stamina and strength and prepare for her surgery.    Expected Outcomes STG Keriann attends all scheduled sessions, she is able to progress her exercise.  She continues to [facility] her Bipolar symptoms LTG Jardyn is able to continue with her exercise progression and manage her Bipolar disease.    Continue Psychosocial Services  Follow up required by staff             Psychosocial Re-Evaluation:   Psychosocial Discharge (Final Psychosocial Re-Evaluation):   Education: Education Goals: Education classes will be provided on a weekly basis, covering required topics.  Participant will state understanding/return demonstration of topics presented.  Learning Barriers/Preferences:  Learning Barriers/Preferences - 01/28/23 1023       Learning Barriers/Preferences   Learning Barriers Hearing    Learning Preferences None             General Pulmonary Education Topics:  Infection Prevention: - Provides verbal and written material to individual with discussion of infection control including proper hand washing and proper equipment cleaning during exercise session. Flowsheet Row Pulmonary Rehab from 02/02/2023 in [facility] Cardiac and Pulmonary Rehab  Date 02/02/23  Educator [facility]  Instruction Review Code 1- Verbalizes Understanding       Falls Prevention: - Provides verbal and written material to individual with discussion of falls prevention and safety. Flowsheet Row Pulmonary Rehab from 02/02/2023 in [facility] Cardiac and Pulmonary Rehab  Date 02/02/23  Educator [facility]  Instruction Review Code 1- Verbalizes Understanding       Chronic Lung Disease Review: - Group verbal instruction with posters, models, PowerPoint presentations and videos,  to review new updates, new respiratory medications, new advancements in procedures and treatments. Providing information on websites and "800" numbers for continued self-education. Includes information about supplement oxygen, available portable oxygen systems, continuous and intermittent flow rates, oxygen safety, concentrators, and Medicare reimbursement for oxygen. Explanation of Pulmonary Drugs, including class, frequency, complications, importance of spacers, rinsing mouth after steroid MDI's, and proper cleaning methods for nebulizers. Review of basic lung anatomy and physiology related to function, structure, and complications of lung disease. Review of risk factors. Discussion about methods for diagnosing sleep apnea and types of masks and machines for [name]. Includes a review of the use of types of environmental controls:  home humidity, furnaces, filters, dust mite/pet prevention, HEPA vacuums. Discussion about weather changes, air quality and the benefits of nasal washing. Instruction on Warning signs, infection symptoms, calling MD promptly, preventive modes, and value of vaccinations. Review  of effective airway clearance, coughing and/or vibration techniques. Emphasizing that all should Create an Action Plan. Written material given at graduation. Flowsheet Row Pulmonary Rehab from 02/02/2023 in [facility] Cardiac and Pulmonary Rehab  Education need identified 02/02/23       AED/CPR: - Group verbal and written instruction with the use of models to demonstrate the basic use of the AED with the basic ABC's of resuscitation.    Anatomy and Cardiac Procedures: - Group verbal and visual presentation and models provide information about basic cardiac anatomy and function. Reviews the testing methods done to diagnose heart disease and the outcomes of the test results. Describes the treatment choices: Medical Management, Angioplasty, or Coronary Bypass Surgery for treating various heart conditions including Myocardial Infarction, Angina, Valve Disease, and Cardiac Arrhythmias.  Written material given at graduation.   Medication Safety: - Group verbal and visual instruction to review commonly prescribed medications for heart and lung disease. Reviews the medication, class of the drug, and side effects. Includes the steps to properly store meds and maintain the prescription regimen.  Written material given at graduation.   Other: -Provides group and verbal instruction on various topics (see comments)   Knowledge Questionnaire Score:  Knowledge Questionnaire Score - 02/02/23 1750       Knowledge Questionnaire Score   Pre Score 14/18              Core Components/Risk Factors/Patient Goals at Admission:  Personal Goals and Risk Factors at Admission - 02/02/23 1748       Core Components/Risk Factors/Patient  Goals on Admission    Weight Management Yes;Obesity    Intervention Weight Management: Develop a combined nutrition and exercise program designed to reach desired caloric intake, while maintaining appropriate intake of nutrient and fiber, sodium and fats, and appropriate energy expenditure required for the weight goal.;Weight Management: Provide education and appropriate resources to help participant work on and attain dietary goals.;Weight Management/Obesity: Establish reasonable short term and long term weight goals.;Obesity: Provide education and appropriate resources to help participant work on and attain dietary goals.    Admit Weight 376 lb 8 oz (170.8 kg)    Goal Weight: Short Term 370 lb (167.8 kg)    Goal Weight: Long Term 340 lb (154.2 kg)    Expected Outcomes Short Term: Continue to assess and modify interventions until short term weight is achieved;Long Term: Adherence to nutrition and physical activity/exercise program aimed toward attainment of established weight goal;Weight Loss: Understanding of general recommendations for a balanced deficit meal plan, which promotes 1-2 lb weight loss per week and includes a negative energy balance of [phone] kcal/d;Understanding recommendations for meals to include 15-35% energy as protein, 25-35% energy from fat, 35-60% energy from carbohydrates, less than 200mg  of dietary cholesterol, 20-35 gm of total fiber daily;Understanding of distribution of calorie intake throughout the day with the consumption of 4-5 meals/snacks    Improve shortness of breath with ADL's Yes    Intervention Provide education, individualized exercise plan and daily activity instruction to help decrease symptoms of SOB with activities of daily living.    Expected Outcomes Short Term: Improve cardiorespiratory fitness to achieve a reduction of symptoms when performing ADLs;Long Term: Be able to perform more ADLs without symptoms or delay the onset of symptoms    Increase knowledge  of respiratory medications and ability to use respiratory devices properly  Yes    Intervention Provide education and demonstration as needed of appropriate use of medications, inhalers, and oxygen therapy.  Expected Outcomes Short Term: Achieves understanding of medications use. Understands that oxygen is a medication prescribed by physician. Demonstrates appropriate use of inhaler and oxygen therapy.;Long Term: Maintain appropriate use of medications, inhalers, and oxygen therapy.    Diabetes Yes    Intervention Provide education about signs/symptoms and action to take for hypo/hyperglycemia.;Provide education about proper nutrition, including hydration, and aerobic/resistive exercise prescription along with prescribed medications to achieve blood glucose in normal ranges: Fasting glucose 65-99 mg/dL    Expected Outcomes Short Term: Participant verbalizes understanding of the signs/symptoms and immediate care of hyper/hypoglycemia, proper foot care and importance of medication, aerobic/resistive exercise and nutrition plan for blood glucose control.;Long Term: Attainment of HbA1C < 7%.    Hypertension Yes    Intervention Provide education on lifestyle modifcations including regular physical activity/exercise, weight management, moderate sodium restriction and increased consumption of fresh fruit, vegetables, and low fat dairy, alcohol moderation, and smoking cessation.;Monitor prescription use compliance.    Expected Outcomes Short Term: Continued assessment and intervention until BP is < 140/17mm HG in hypertensive participants. < 130/81mm HG in hypertensive participants with diabetes, heart failure or chronic kidney disease.;Long Term: Maintenance of blood pressure at goal levels.             Education:Diabetes - Individual verbal and written instruction to review signs/symptoms of diabetes, desired ranges of glucose level fasting, after meals and with exercise. Acknowledge that pre and post  exercise glucose checks will be done for 3 sessions at entry of program. Flowsheet Row Pulmonary Rehab from 02/02/2023 in [facility] Cardiac and Pulmonary Rehab  Date 02/02/23  Educator [facility]  Instruction Review Code 1- Verbalizes Understanding       Know Your Numbers and Heart Failure: - Group verbal and visual instruction to discuss disease risk factors for cardiac and pulmonary disease and treatment options.  Reviews associated critical values for Overweight/Obesity, Hypertension, Cholesterol, and Diabetes.  Discusses basics of heart failure: signs/symptoms and treatments.  Introduces Heart Failure Zone chart for action plan for heart failure.  Written material given at graduation.   Core Components/Risk Factors/Patient Goals Review:    Core Components/Risk Factors/Patient Goals at Discharge (Final Review):    ITP Comments:  ITP Comments     Row Name 01/28/23 1036 02/02/23 1734 02/02/23 1756 02/04/23 0757     ITP Comments Virtual orientation call completed today. shehas an appointment on Date: [id number]  for EP eval and gym Orientation.  Documentation of diagnosis can be found in [facility] Date: 01/15/2023 . Completed 6MWT and gym orientation. Initial ITP created and sent for review to Dr. Zetta Bills, Medical Director. Patient reports that her Blood sugars are always in the 400-500 range. A clearance note will be sent to her endocrinologist before starting pulmonary rehab since patient states she will never be able to get at BG below 300, which is our program guideline. She reports this is due to her carcer treatments. 30 Day review completed. Medical Director ITP review done, changes made as directed, and signed approval by Medical Director.    new to program     patient has not started until blood sugar ranges approved by MD             Comments:

## 2023-02-05 ENCOUNTER — Ambulatory Visit: Admit: 2023-02-05 | Discharge: 2023-02-07 | Payer: MEDICAID

## 2023-02-09 NOTE — Unmapped (Signed)
recent COVID-19 infection. Had an overall mild case and has been recovering well. Patient restarted Bosutinib about 2 weeks ago. She reports no acute increase in swelling since re-starting the medication, besides some mild lower leg edema one evening, that resolved spontaneously. Denies any increased abdominal pain, abdominal swelling, fatigue or bone pains. Patient continue to have shortness of breath which is her baseline, and is starting to work with pulmonary rehabilitation this week to assist with this. Will also be following up with pulmonology in the next few weeks for repeat PFT's and assessment of her restrictive lung disease. CBC today is stable with no concerns. CMP reveals mild hyponatremia, and continued uncontrolled blood sugars at 427. Discussed below but patient has not been using Mounjaro this past week due to having COVID-19 plans to restart soon. I expressed concern about consistently higher blood sugars overall, and encouraged she restart her medication as soon as possible. Continues to take Torsemide 40 mg one day, and 20 mg the next alternating. She has a follow up with Cardiology, Hepatology, and Primary Care in the coming weeks to discuss her volume status and other chronic issues.  Once underlying etiology is more clear we will be able to determine in more detail if we need to consider TKI as an underlying contributor and possibly switch to Asciminib as 3rd line treatment. For now, patient has tolerated re-introduction of Bosutinib with no acute problems, so will plan to continue the medication until patient able to follow up with other specialties and we can make a more informed decision. RTC in 2 months for repeat labs, and BCR-ABL recheck.      NAFLD: CT CAP during ED visit 02/06/21 showed variably enhancing liver lesions, most consistent with hemangioma. 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 Most recent abdominal US revealing for hepatosplenomegaly. No ascites appreciated. Patient scheduled to follow up with Hepatology on 3/28.      DM:  Follow with endocrinology.  Patient with continued uncontrolled blood sugars but with no obvious signs of DKA or HHS. She has not started her elevated dose of Mounjaro yet due to her being sick, strongly encouraged her to start this ASAP to help better control things. Recommend close follow up with PCP and Endocrinology.      Fertility planning: Seen by infertility team. Expresses that she wishes to have children in the future.  Received Lupron injection today, will continue to receive every 3 months in [facility].      Hx restrictive lung disease/Fluid Overload: Evaluated previously by [facility] pulmonology for chronic dyspnea and noted to have restrictive lung disease likely predominantly from obesity. Continues to follow with Dr. Smith Robert. Patient recently started pulmonary rehabilitation in [place]. Will follow with pulmonology in the coming weeks for repeat pulmonary function testing.      COVID-19: Diagnosed on 3/6 after ER visit. Patient reports high fevers, and extreme fatigue initially, but by day 3 or 4 of illness was feeling much improved. Did not take Paxlovid due to interaction with Bosutinib. Continues to have a cough that is improving with use of supportive care medications such as Tessalon Perles. Patient with mild infection overall, no further recommendations besides continued supportive care.         Plan and Recommendations:  - Continue Bostunib 500 mg daily   - Recommend restarting mounjaro at prescribed dose for better blood sugar control and weight management  - Lupron injection every 3 months in [place],  dose given today due to missed appointment on 3/6 due to COVID  - Recommend Hepatology, PCP, and  Cardiology follow up  - Continue pulmonary rehabilitation  - RTC in 2 months for check up and BCR-ABL re-assessment which she is tolerating well. Her blood glucose levels have been erratic since her hospitalization, but are gradually stabilizing. She was diagnosed with COVID-19 during her hospital stay.  Her mom expresses frustration regarding the patient's inability to participate in pulmonary rehabilitation due to elevated blood glucose levels. The patient's endocrinologist has declined to provide clearance for rehabilitation until a reevaluation in 03/2023. The patient does not have sliding scale insulin and is currently on Humulin RU 500 twice daily. Prior to hospitalization, her blood glucose levels were not excessively high, with the highest recorded level being 300 postprandial. The patient's appetite has improved, and she is maintaining a healthy diet. She denies any leg swelling. They possess a pulse oximeter at home, which recorded a drop to 91 during a 6-minute walk test. The patient is considering joining [employer] for regular exercise. She engages in gardening activities, alternating between standing and sitting. A recent sleep study ruled out sleep apnea. The patient has upcoming appointments with cardiology and pulmonology.    ROS  A comprehensive review of systems was conducted with negative results except as noted in the HPI above.          Objective:   Vital Signs  BP 110/62 (BP Site: L Arm, BP Position: Sitting, BP Cuff Size: Large)  - Pulse 72  - Ht 182.9 cm (6' 0.01)  - Wt (!) 169.9 kg (374 lb 9.6 oz)  - SpO2 97%  - Breastfeeding No  - BMI 50.79 kg/m??  Body mass index is 50.79 kg/m??.  Exam  Physical Exam  Constitutional:       Appearance: Normal appearance.   HENT:      Head: Normocephalic and atraumatic.   Cardiovascular:      Rate and Rhythm: Normal rate and regular rhythm.   Pulmonary:      Effort: Pulmonary effort is normal.      Breath sounds: Normal breath sounds.   Musculoskeletal:      Right lower leg: No edema.      Left lower leg: No edema.   Skin:     General: Skin is warm.   Neurological: General: No focal deficit present.      Mental Status: She is alert. Mental status is at baseline.   Psychiatric:         Mood and Affect: Mood normal.         Behavior: Behavior normal.         Thought Content: Thought content normal.             I've personally reviewed and summarized records in EPIC/Media and via CareEverywhere as well as medication, allergies, past medical, social, and family history.   I have reviewed the patient's medical history in detail and updated the computerized patient record.    Note - This chart has been prepared using the Dragon voice recognition system. Typographical errors may have occurred.  Attempts have been made to correct errors, however, inadvertent errors may persist.

## 2023-02-10 NOTE — Unmapped (Signed)
Spoke on the phone with Pulmonary Rehab clinician Shanda Bumps who explained their criteria for safety is that patient's glucose be 110-300. I will have patient return to clinic earlier to assist with this goal. Per rehab provider patient can return to pumonary rehab at any point once glucose improves.     DOS 02/10/23

## 2023-02-11 ENCOUNTER — Encounter: Payer: Self-pay | Admitting: *Deleted

## 2023-02-11 ENCOUNTER — Ambulatory Visit
Admit: 2023-02-11 | Discharge: 2023-02-12 | Payer: MEDICAID | Attending: Geriatric Medicine | Primary: Geriatric Medicine

## 2023-02-11 DIAGNOSIS — J984 Other disorders of lung: Secondary | ICD-10-CM

## 2023-02-11 DIAGNOSIS — I272 Pulmonary hypertension, unspecified: Secondary | ICD-10-CM

## 2023-02-11 MED ORDER — ALBUTEROL SULFATE HFA 90 MCG/ACTUATION AEROSOL INHALER
Freq: Four times a day (QID) | RESPIRATORY_TRACT | 0 refills | 0 days | Status: CP | PRN
Start: 2023-02-11 — End: 2024-02-11

## 2023-02-11 MED ORDER — CETIRIZINE 10 MG TABLET
ORAL_TABLET | 3 refills | 0 days | Status: CP
Start: 2023-02-11 — End: ?

## 2023-02-11 NOTE — Unmapped (Signed)
Patient is requesting the following refill  Requested Prescriptions     Pending Prescriptions Disp Refills    cetirizine (ZYRTEC) 10 MG tablet [Pharmacy Med Name: CETIRIZINE HCL 10 MG TAB] 90 tablet 3     Sig: TAKE 1 TABLET BY MOUTH IN THE MORNING.       Order pended. Please advise. Thanks    Last OV: 01/20/2023   Next OV: 02/11/2023      To pharmacy: FOR FUTURE REFILLS **BUBBLE PACK PT

## 2023-02-11 NOTE — Progress Notes (Signed)
Discharge Progress Report  Patient Details  Name: Cynthia Lang MRN: [id number] Date of Birth: 27-Jun-1991 Referring Provider:   Flowsheet Row Pulmonary Rehab from 02/02/2023 in [facility] Cardiac and Pulmonary Rehab  Referring Provider Nurum Romeo Apple Erdem        Number of Visits: 1  Reason for Discharge:  Early Exit:  blood sugar control issues     Diagnosis:  Restrictive lung disease  Pulmonary hypertension ([place])     Functional Capacity:  6 Minute Walk     Row Name 02/02/23 1735         6 Minute Walk   Phase Initial     Distance 800 feet     Walk Time 4.5 minutes     # of Rest Breaks 2     MPH 2.02     METS 2.41     RPE 15     Perceived Dyspnea  3     VO2 Peak 8.45     Symptoms Yes (comment)     Comments SOB     Resting HR 64 bpm     Resting BP 138/82     Resting Oxygen Saturation  95 %     Exercise Oxygen Saturation  during 6 min walk 92 %     Max Ex. HR 81 bpm     Max Ex. BP 140/80     2 Minute Post BP 124/78       Interval HR   1 Minute HR 77     2 Minute HR 81     3 Minute HR 71     4 Minute HR 77     5 Minute HR 80     6 Minute HR 94     2 Minute Post HR 68     Interval Heart Rate? Yes       Interval Oxygen   Interval Oxygen? Yes     Baseline Oxygen Saturation % 95 %     1 Minute Oxygen Saturation % 95 %     1 Minute Liters of Oxygen 0 L     2 Minute Oxygen Saturation % 93 %     2 Minute Liters of Oxygen 0 L     3 Minute Oxygen Saturation % 92 %     3 Minute Liters of Oxygen 0 L     4 Minute Oxygen Saturation % 94 %     4 Minute Liters of Oxygen 0 L     5 Minute Oxygen Saturation % 92 %     5 Minute Liters of Oxygen 0 L     6 Minute Oxygen Saturation % 94 %     6 Minute Liters of Oxygen 0 L     2 Minute Post Oxygen Saturation % 96 %     2 Minute Post Liters of Oxygen 0 L

## 2023-02-11 NOTE — Progress Notes (Signed)
Pulmonary Individual Treatment Plan  Patient Details  Name: Cynthia Lang MRN: [id number] Date of Birth: [birth year] Referring Provider:   Flowsheet Row Pulmonary Rehab from 02/02/2023 in [facility] Cardiac and Pulmonary Rehab  Referring Provider Nurum Romeo Apple Erdem       Initial Encounter Date:  Flowsheet Row Pulmonary Rehab from 02/02/2023 in [facility] Cardiac and Pulmonary Rehab  Date 02/02/23       Visit Diagnosis: Restrictive lung disease  Pulmonary hypertension ([place])  Patient's Home Medications on Admission:  Current Outpatient Medications:    ACCU-CHEK GUIDE test strip, CHECK SUGARS BEFORE MEALS 3 TIMES DAILY FOR INSULIN DEPENDANT TYPE TWO DIABETES., Disp: , Rfl:    atenolol (TENORMIN) 100 MG tablet, Take 100 mg by mouth daily., Disp: , Rfl:    bosutinib (BOSULIF) 500 MG tablet, Take 500 mg by mouth daily with breakfast. Take with food., Disp: , Rfl:    CALTRATE 600+D3 600-20 MG-MCG TABS, Take 1 tablet by mouth 2 (two) times daily., Disp: , Rfl:    cetirizine (ZYRTEC) 10 MG tablet, Take 10 mg by mouth every morning., Disp: , Rfl:    clindamycin (CLEOCIN T) 1 % SWAB, Apply topically 2 (two) times daily. (Patient not taking: Reported on 01/28/2023), Disp: , Rfl:    clobetasol (TEMOVATE) 0.05 % external solution, Apply topically 2 (two) times daily as needed., Disp: , Rfl:    Continuous Blood Gluc Sensor (DEXCOM G6 SENSOR) MISC, , Disp: , Rfl:    cyclobenzaprine (FLEXERIL) 10 MG tablet, Take by mouth., Disp: , Rfl:    divalproex (DEPAKOTE ER) 500 MG 24 hr tablet, Take 1 tablet (500 mg total) by mouth at bedtime., Disp: 30 tablet, Rfl: 3   ferrous sulfate 325 (65 FE) MG EC tablet, Take by mouth., Disp: , Rfl:    glipiZIDE (GLUCOTROL XL) 10 MG 24 hr tablet, Take 20 mg by mouth daily. (Patient not taking: Reported on 01/28/2023), Disp: , Rfl:    HUMULIN R U-500 KWIKPEN 500 UNIT/ML KwikPen, Inject into the skin., Disp: , Rfl:    hydrochlorothiazide (HYDRODIURIL) 25 MG tablet, Take by mouth.  (Patient not taking: Reported on 01/28/2023), Disp: , Rfl:    hydrocortisone 2.5 % ointment, Apply 1 application topically 2 (two) times daily. (Patient not taking: Reported on 01/28/2023), Disp: , Rfl:    hydrOXYzine (ATARAX) 50 MG tablet, TAKE ONE (1) TO ONE AND ONE-HALF (1+1/2)TABLET BY MOUTH AT BEDTIME AS NEEDED FORANXIETY OR SLEEP, Disp: 45 tablet, Rfl: 2   lamoTRIgine (LAMICTAL) 200 MG tablet, Take 1 tablet (200 mg total) by mouth 2 (two) times daily., Disp: 60 tablet, Rfl: 3   metFORMIN (GLUCOPHAGE) 1000 MG tablet, Take by mouth., Disp: , Rfl:    MOUNJARO 2.5 MG/0.5ML Pen, SMARTSIG:0.5 Milliliter(s) SUB-Q Once a Week, Disp: , Rfl:    norethindrone (AYGESTIN) 5 MG tablet, Take 5 mg by mouth every morning., Disp: , Rfl:    NOVOFINE PEN NEEDLE 32G X 6 MM MISC, Inject into the skin 3 (three) times daily. (Patient not taking: Reported on 01/28/2023), Disp: , Rfl:    pantoprazole (PROTONIX) 40 MG tablet, Take 40 mg by mouth every morning., Disp: , Rfl:    pregabalin (LYRICA) 75 MG capsule, TAKE 1 CAPSULE BY MOUTH EVERY MORNING and 2 EVERY EVENING, Disp: , Rfl:    torsemide (DEMADEX) 20 MG tablet, Take 20 mg by mouth every other day., Disp: , Rfl:    traZODone (DESYREL) 100 MG tablet, Take 2 tablets (200 mg total) by mouth at bedtime., Disp:  60 tablet, Rfl: 3   TRESIBA FLEXTOUCH 200 UNIT/ML FlexTouch Pen, Inject into the skin. (Patient not taking: Reported on 01/28/2023), Disp: , Rfl:    triamcinolone ointment (KENALOG) 0.1 %, Apply topically., Disp: , Rfl:    valsartan (DIOVAN) 160 MG tablet, Take by mouth., Disp: , Rfl:    VENTOLIN HFA 108 (90 Base) MCG/ACT inhaler, Inhale 2 puffs into the lungs every 6 (six) hours as needed., Disp: , Rfl:    VICTOZA 18 MG/3ML SOPN, SMARTSIG:0.3 Milliliter(s) SUB-Q Daily (Patient not taking: Reported on 01/28/2023), Disp: , Rfl:    ziprasidone (GEODON) 80 MG capsule, TAKE 1 CAPSULE BY MOUTH TWICE DAILY WITHFOOD., Disp: 60 capsule, Rfl: 3  Past Medical History: Past  Medical History:  Diagnosis Date   Depression    Diabetes mellitus, type II ([place])    Hypertension    Leukemia, 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   PCOS (polycystic ovarian syndrome)    PTSD (post-traumatic stress disorder)     Tobacco Use: Social History   Tobacco Use  Smoking Status Former   Packs/day: 0.50   Years: 10.00   Additional pack years: 0.00   Total pack years: 5.00   Types: Cigarettes   Quit date: 2014   Years since quitting: 10.2  Smokeless Tobacco Never    Labs: Review Flowsheet       Latest Ref Rng & Units 10/18/2021  Labs for ITP Cardiac and Pulmonary Rehab  Cholestrol 0 - 200 mg/dL 165   LDL (calc) 0 - 99 mg/dL 98   HDL-C >40 mg/dL 36   Trlycerides <150 mg/dL 156      Pulmonary Assessment Scores:  Pulmonary Assessment Scores     Row Name 02/02/23 1750         ADL UCSD   ADL Phase Entry     SOB Score total 90     Rest 1     Walk 3     Stairs 5     Bath 4     Dress 3     Shop 5       CAT Score   CAT Score 21       mMRC Score   mMRC Score 3              UCSD: Self-administered rating of dyspnea associated with activities of daily living (ADLs) 6-point scale (0 = "not at all" to 5 = "maximal or unable to do because of breathlessness")  Scoring Scores range from 0 to 120.  Minimally important difference is 5 units  CAT: CAT can identify the health impairment of COPD patients and is better correlated with disease progression.  CAT has a scoring range of zero to 40. The CAT score is classified into four groups of low (less than 10), medium (10 - 20), high (21-30) and very high (31-40) based on the impact level of disease on health status. A CAT score over 10 suggests significant symptoms.  A worsening CAT score could be explained by an exacerbation, poor medication adherence, poor inhaler technique, or progression of COPD or comorbid conditions.  CAT MCID is 2 points  mMRC: mMRC (Modified Medical Research Council) Dyspnea Scale is  used to assess the degree of baseline functional disability in patients of respiratory disease due to dyspnea. No minimal important difference is established. A decrease in score of 1 point or greater is considered a positive change.   Pulmonary Function Assessment:   Exercise Target Goals: Exercise Program Goal: Individual exercise prescription  set using results from initial 6 min walk test and THRR while considering  patient's activity barriers and safety.   Exercise Prescription Goal: Initial exercise prescription builds to 30-45 minutes a day of aerobic activity, 2-3 days per week.  Home exercise guidelines will be given to patient during program as part of exercise prescription that the participant will acknowledge.  Education: Aerobic Exercise: - Group verbal and visual presentation on the components of exercise prescription. Introduces F.I.T.T principle from ACSM for exercise prescriptions.  Reviews F.I.T.T. principles of aerobic exercise including progression. Written material given at graduation.   Education: Resistance Exercise: - Group verbal and visual presentation on the components of exercise prescription. Introduces F.I.T.T principle from ACSM for exercise prescriptions  Reviews F.I.T.T. principles of resistance exercise including progression. Written material given at graduation.    Education: Exercise & Equipment Safety: - Individual verbal instruction and demonstration of equipment use and safety with use of the equipment. Flowsheet Row Pulmonary Rehab from 02/02/2023 in [facility] Cardiac and Pulmonary Rehab  Date 02/02/23  Educator [facility]  Instruction Review Code 1- Verbalizes Understanding       Education: Exercise Physiology & General Exercise Guidelines: - Group verbal and written instruction with models to review the exercise physiology of the cardiovascular system and associated critical values. Provides general exercise guidelines with specific guidelines to those with  heart or lung disease.    Education: Flexibility, Balance, Mind/Body Relaxation: - Group verbal and visual presentation with interactive activity on the components of exercise prescription. Introduces F.I.T.T principle from ACSM for exercise prescriptions. Reviews F.I.T.T. principles of flexibility and balance exercise training including progression. Also discusses the mind body connection.  Reviews various relaxation techniques to help reduce and manage stress (i.e. Deep breathing, progressive muscle relaxation, and visualization). Balance handout provided to take home. Written material given at graduation.   Activity Barriers & Risk Stratification:  Activity Barriers & Cardiac Risk Stratification - 02/02/23 1739       Activity Barriers & Cardiac Risk Stratification   Activity Barriers Other (comment);Shortness of Breath             6 Minute Walk:  6 Minute Walk     Row Name 02/02/23 1735         6 Minute Walk   Phase Initial     Distance 800 feet     Walk Time 4.5 minutes     # of Rest Breaks 2     MPH 2.02     METS 2.41     RPE 15     Perceived Dyspnea  3     VO2 Peak 8.45     Symptoms Yes (comment)     Comments SOB     Resting HR 64 bpm     Resting BP 138/82     Resting Oxygen Saturation  95 %     Exercise Oxygen Saturation  during 6 min walk 92 %     Max Ex. HR 81 bpm     Max Ex. BP 140/80     2 Minute Post BP 124/78       Interval HR   1 Minute HR 77     2 Minute HR 81     3 Minute HR 71     4 Minute HR 77     5 Minute HR 80     6 Minute HR 94     2 Minute Post HR 68     Interval Heart Rate? Yes  Interval Oxygen   Interval Oxygen? Yes     Baseline Oxygen Saturation % 95 %     1 Minute Oxygen Saturation % 95 %     1 Minute Liters of Oxygen 0 L     2 Minute Oxygen Saturation % 93 %     2 Minute Liters of Oxygen 0 L     3 Minute Oxygen Saturation % 92 %     3 Minute Liters of Oxygen 0 L     4 Minute Oxygen Saturation % 94 %     4 Minute  Liters of Oxygen 0 L     5 Minute Oxygen Saturation % 92 %     5 Minute Liters of Oxygen 0 L     6 Minute Oxygen Saturation % 94 %     6 Minute Liters of Oxygen 0 L     2 Minute Post Oxygen Saturation % 96 %     2 Minute Post Liters of Oxygen 0 L             Oxygen Initial Assessment:  Oxygen Initial Assessment - 02/02/23 1750       Home Oxygen   Home Oxygen Device None    Sleep Oxygen Prescription None    Home Exercise Oxygen Prescription None    Home Resting Oxygen Prescription None      Intervention   Short Term Goals To learn and understand importance of maintaining oxygen saturations>88%;To learn and demonstrate proper pursed lip breathing techniques or other breathing techniques. ;To learn and demonstrate proper use of respiratory medications    Long  Term Goals Maintenance of O2 saturations>88%;Exhibits proper breathing techniques, such as pursed lip breathing or other method taught during program session;Compliance with respiratory medication;Demonstrates proper use of MDI's             Oxygen Re-Evaluation:   Oxygen Discharge (Final Oxygen Re-Evaluation):   Initial Exercise Prescription:  Initial Exercise Prescription - 02/02/23 1700       Date of Initial Exercise RX and Referring Provider   Date 02/02/23    Referring Provider Nurum Romeo Apple Erdem      Oxygen   Maintain Oxygen Saturation 88% or higher      T5 Nustep   Level 1    SPM 80    Minutes 15    METs 2.41      Track   Laps 12    Minutes 15    METs 1.65      Prescription Details   Frequency (times per week) 3    Duration Progress to 30 minutes of continuous aerobic without signs/symptoms of physical distress      Intensity   THRR 40-80% of Max Heartrate 113-162    Ratings of Perceived Exertion 11-13    Perceived Dyspnea 0-4      Progression   Progression Continue to progress workloads to maintain intensity without signs/symptoms of physical distress.      Resistance Training    Training Prescription Yes    Weight 3    Reps 10-15             Perform Capillary Blood Glucose checks as needed.  Exercise Prescription Changes:   Exercise Prescription Changes     Row Name 02/02/23 1700             Response to Exercise   Blood Pressure (Admit) 138/82       Blood Pressure (Exercise) 140/80  Blood Pressure (Exit) 124/78       Heart Rate (Admit) 64 bpm       Heart Rate (Exercise) 81 bpm       Heart Rate (Exit) 68 bpm       Oxygen Saturation (Admit) 95 %       Oxygen Saturation (Exercise) 92 %       Oxygen Saturation (Exit) 96 %       Rating of Perceived Exertion (Exercise) 15       Perceived Dyspnea (Exercise) 3       Symptoms SOB       Comments 6 MWT results                Exercise Comments:   Exercise Goals and Review:   Exercise Goals     Row Name 02/02/23 1744             Exercise Goals   Increase Physical Activity Yes       Intervention Provide advice, education, support and counseling about physical activity/exercise needs.;Develop an individualized exercise prescription for aerobic and resistive training based on initial evaluation findings, risk stratification, comorbidities and participant's personal goals.       Expected Outcomes Short Term: Attend rehab on a regular basis to increase amount of physical activity.;Long Term: Add in home exercise to make exercise part of routine and to increase amount of physical activity.;Long Term: Exercising regularly at least 3-5 days a week.       Increase Strength and Stamina Yes       Intervention Provide advice, education, support and counseling about physical activity/exercise needs.;Develop an individualized exercise prescription for aerobic and resistive training based on initial evaluation findings, risk stratification, comorbidities and participant's personal goals.       Expected Outcomes Short Term: Increase workloads from initial exercise prescription for resistance, speed, and  METs.;Short Term: Perform resistance training exercises routinely during rehab and add in resistance training at home;Long Term: Improve cardiorespiratory fitness, muscular endurance and strength as measured by increased METs and functional capacity (6MWT)       Able to understand and use rate of perceived exertion (RPE) scale Yes       Intervention Provide education and explanation on how to use RPE scale       Expected Outcomes Short Term: Able to use RPE daily in rehab to express subjective intensity level;Long Term:  Able to use RPE to guide intensity level when exercising independently       Able to understand and use Dyspnea scale Yes       Intervention Provide education and explanation on how to use Dyspnea scale       Expected Outcomes Short Term: Able to use Dyspnea scale daily in rehab to express subjective sense of shortness of breath during exertion;Long Term: Able to use Dyspnea scale to guide intensity level when exercising independently       Knowledge and understanding of Target Heart Rate Range (THRR) Yes       Intervention Provide education and explanation of THRR including how the numbers were predicted and where they are located for reference       Expected Outcomes Short Term: Able to state/look up THRR;Long Term: Able to use THRR to govern intensity when exercising independently;Short Term: Able to use daily as guideline for intensity in rehab       Able to check pulse independently Yes       Intervention Provide education and demonstration on  how to check pulse in carotid and radial arteries.;Review the importance of being able to check your own pulse for safety during independent exercise       Expected Outcomes Short Term: Able to explain why pulse checking is important during independent exercise       Understanding of Exercise Prescription Yes       Intervention Provide education, explanation, and written materials on patient's individual exercise prescription       Expected  Outcomes Short Term: Able to explain program exercise prescription;Long Term: Able to explain home exercise prescription to exercise independently                Exercise Goals Re-Evaluation :   Discharge Exercise Prescription (Final Exercise Prescription Changes):  Exercise Prescription Changes - 02/02/23 1700       Response to Exercise   Blood Pressure (Admit) 138/82    Blood Pressure (Exercise) 140/80    Blood Pressure (Exit) 124/78    Heart Rate (Admit) 64 bpm    Heart Rate (Exercise) 81 bpm    Heart Rate (Exit) 68 bpm    Oxygen Saturation (Admit) 95 %    Oxygen Saturation (Exercise) 92 %    Oxygen Saturation (Exit) 96 %    Rating of Perceived Exertion (Exercise) 15    Perceived Dyspnea (Exercise) 3    Symptoms SOB    Comments 6 MWT results             Nutrition:  Target Goals: Understanding of nutrition guidelines, daily intake of sodium 1500mg , cholesterol 200mg , calories 30% from fat and 7% or less from saturated fats, daily to have 5 or more servings of fruits and vegetables.  Education: All About Nutrition: -Group instruction provided by verbal, written material, interactive activities, discussions, models, and posters to present general guidelines for heart healthy nutrition including fat, fiber, MyPlate, the role of sodium in heart healthy nutrition, utilization of the nutrition label, and utilization of this knowledge for meal planning. Follow up email sent as well. Written material given at graduation.   Biometrics:  Pre Biometrics - 02/02/23 1745       Pre Biometrics   Height 6' (1.829 m)    Weight 376 lb 8 oz (170.8 kg)    Waist Circumference 60 inches    Hip Circumference 60 inches    Waist to Hip Ratio 1 %    BMI (Calculated) 51.05    Single Leg Stand 30 seconds              Nutrition Therapy Plan and Nutrition Goals:  Nutrition Therapy & Goals - 02/02/23 1748       Intervention Plan   Intervention Prescribe, educate and counsel  regarding individualized specific dietary modifications aiming towards targeted core components such as weight, hypertension, lipid management, diabetes, heart failure and other comorbidities.    Expected Outcomes Short Term Goal: A plan has been developed with personal nutrition goals set during dietitian appointment.;Short Term Goal: Understand basic principles of dietary content, such as calories, fat, sodium, cholesterol and nutrients.;Long Term Goal: Adherence to prescribed nutrition plan.             Nutrition Assessments:  MEDIFICTS Score Key: ?70 Need to make dietary changes  40-70 Heart Healthy Diet ? 40 Therapeutic Level Cholesterol Diet  Flowsheet Row Pulmonary Rehab from 02/02/2023 in [facility] Cardiac and Pulmonary Rehab  Picture Your Plate Total Score on Admission 61      Picture Your Plate Scores: [id number] Unhealthy dietary  pattern with much room for improvement. 41-50 Dietary pattern unlikely to meet recommendations for good health and room for improvement. 51-60 More healthful dietary pattern, with some room for improvement.  >60 Healthy dietary pattern, although there may be some specific behaviors that could be improved.   Nutrition Goals Re-Evaluation:   Nutrition Goals Discharge (Final Nutrition Goals Re-Evaluation):   Psychosocial: Target Goals: Acknowledge presence or absence of significant depression and/or stress, maximize coping skills, provide positive support system. Participant is able to verbalize types and ability to use techniques and skills needed for reducing stress and depression.   Education: Stress, Anxiety, and Depression - Group verbal and visual presentation to define topics covered.  Reviews how body is impacted by stress, anxiety, and depression.  Also discusses healthy ways to reduce stress and to treat/manage anxiety and depression.  Written material given at graduation.   Education: Sleep Hygiene -Provides group verbal and written instruction  about how sleep can affect your health.  Define sleep hygiene, discuss sleep cycles and impact of sleep habits. Review good sleep hygiene tips.    Initial Review & Psychosocial Screening:  Initial Psych Review & Screening - 01/28/23 1018       Initial Review   Current issues with Current Psychotropic Meds;Current Anxiety/Panic   bi polar  taking meds, has therapist on going     [place]? Yes   mom     Barriers   Psychosocial barriers to participate in program There are no identifiable barriers or psychosocial needs.      Screening Interventions   Interventions Encouraged to exercise;To provide support and resources with identified psychosocial needs;Provide feedback about the scores to participant    Expected Outcomes Short Term goal: Utilizing psychosocial counselor, staff and physician to assist with identification of specific Stressors or current issues interfering with healing process. Setting desired goal for each stressor or current issue identified.;Long Term Goal: Stressors or current issues are controlled or eliminated.;Short Term goal: Identification and review with participant of any Quality of Life or Depression concerns found by scoring the questionnaire.;Long Term goal: The participant improves quality of Life and PHQ9 Scores as seen by post scores and/or verbalization of changes             Quality of Life Scores:  Scores of 19 and below usually indicate a poorer quality of life in these areas.  A difference of  2-3 points is a clinically meaningful difference.  A difference of 2-3 points in the total score of the Quality of Life Index has been associated with significant improvement in overall quality of life, self-image, physical symptoms, and general health in studies assessing change in quality of life.  PHQ-9: Review Flowsheet  More data exists      02/02/2023 06/30/2022 04/03/2022 12/31/2021 10/31/2021  Depression screen PHQ 2/9   Decreased Interest 1      Down, Depressed, Hopeless 1      PHQ - 2 Score 2      Altered sleeping 3 - - -   Tired, decreased energy 3 - - -   Change in appetite 3 - - -   Feeling bad or failure about yourself  3 - - -   Trouble concentrating 1 - - -   Moving slowly or fidgety/restless 2 - - -   Suicidal thoughts 0 - - -   PHQ-9 Score 17 - - -   Difficult doing work/chores Very difficult - - -  Details       Information is confidential and restricted. Go to Review Flowsheets to unlock data.        Interpretation of Total Score  Total Score Depression Severity:  1-4 = Minimal depression, 5-9 = Mild depression, 10-14 = Moderate depression, 15-19 = Moderately severe depression, 20-27 = Severe depression   Psychosocial Evaluation and Intervention:  Psychosocial Evaluation - 01/28/23 1037       Psychosocial Evaluation & Interventions   Interventions Encouraged to exercise with the program and follow exercise prescription    Comments Marlyss has no barriers to attending the program. She lives with her Mom and her Mom is her support.  She has 4 dogs and 1 cat at home. She is diagnosed as BiPolar and is on meds and sees a threapist and psychiatrist to manage her symptoms. She is working on  medical clearance to be able to have gastric bypass surgery.    She is ready to get started and see if she can improve her stamina and strength and prepare for her surgery.    Expected Outcomes STG Ranada attends all scheduled sessions, she is able to progress her exercise.  She continues to [facility] her Bipolar symptoms LTG Keola is able to continue with her exercise progression and manage her Bipolar disease.    Continue Psychosocial Services  Follow up required by staff             Psychosocial Re-Evaluation:   Psychosocial Discharge (Final Psychosocial Re-Evaluation):   Education: Education Goals: Education classes will be provided on a weekly basis, covering required topics.  Participant will state understanding/return demonstration of topics presented.  Learning Barriers/Preferences:  Learning Barriers/Preferences - 01/28/23 1023       Learning Barriers/Preferences   Learning Barriers Hearing    Learning Preferences None             General Pulmonary Education Topics:  Infection Prevention: - Provides verbal and written material to individual with discussion of infection control including proper hand washing and proper equipment cleaning during exercise session. Flowsheet Row Pulmonary Rehab from 02/02/2023 in [facility] Cardiac and Pulmonary Rehab  Date 02/02/23  Educator [facility]  Instruction Review Code 1- Verbalizes Understanding       Falls Prevention: - Provides verbal and written material to individual with discussion of falls prevention and safety. Flowsheet Row Pulmonary Rehab from 02/02/2023 in [facility] Cardiac and Pulmonary Rehab  Date 02/02/23  Educator [facility]  Instruction Review Code 1- Verbalizes Understanding       Chronic Lung Disease Review: - Group verbal instruction with posters, models, PowerPoint presentations and videos,  to review new updates, new respiratory medications, new advancements in procedures and treatments. Providing information on websites and "800" numbers for continued self-education. Includes information about supplement oxygen, available portable oxygen systems, continuous and intermittent flow rates, oxygen safety, concentrators, and Medicare reimbursement for oxygen. Explanation of Pulmonary Drugs, including class, frequency, complications, importance of spacers, rinsing mouth after steroid MDI's, and proper cleaning methods for nebulizers. Review of basic lung anatomy and physiology related to function, structure, and complications of lung disease. Review of risk factors. Discussion about methods for diagnosing sleep apnea and types of masks and machines for [name]. Includes a review of the use of types of environmental controls:  home humidity, furnaces, filters, dust mite/pet prevention, HEPA vacuums. Discussion about weather changes, air quality and the benefits of nasal washing. Instruction on Warning signs, infection symptoms, calling MD promptly, preventive modes, and value of vaccinations. Review  of effective airway clearance, coughing and/or vibration techniques. Emphasizing that all should Create an Action Plan. Written material given at graduation. Flowsheet Row Pulmonary Rehab from 02/02/2023 in [facility]  Education need identified 02/02/23       AED/CPR: - Group verbal and written instruction with the use of models to demonstrate the basic use of the AED with the basic ABC's of resuscitation.    Anatomy and Cardiac Procedures: - Group verbal and visual presentation and models provide information about basic cardiac anatomy and function. Reviews the testing methods done to diagnose heart disease and the outcomes of the test results. Describes the treatment choices: Medical Management, Angioplasty, or Coronary Bypass Surgery for treating various heart conditions including Myocardial Infarction, Angina, Valve Disease, and Cardiac Arrhythmias.  Written material given at graduation.   Medication Safety: - Group verbal and visual instruction to review commonly prescribed medications for heart and lung disease. Reviews the medication, class of the drug, and side effects. Includes the steps to properly store meds and maintain the prescription regimen.  Written material given at graduation.   Other: -Provides group and verbal instruction on various topics (see comments)   Knowledge Questionnaire Score:  Knowledge Questionnaire Score - 02/02/23 1750       Knowledge Questionnaire Score   Pre Score 14/18              Core Components/Risk Factors/Patient Goals at Admission:  Personal Goals and Risk Factors at Admission - 02/02/23 1748       Core Components/Risk Factors/Patient  Goals on Admission    Weight Management Yes;Obesity    Intervention Weight Management: Develop a combined nutrition and exercise program designed to reach desired caloric intake, while maintaining appropriate intake of nutrient and fiber, sodium and fats, and appropriate energy expenditure required for the weight goal.;Weight Management: Provide education and appropriate resources to help participant work on and attain dietary goals.;Weight Management/Obesity: Establish reasonable short term and long term weight goals.;Obesity: Provide education and appropriate resources to help participant work on and attain dietary goals.    Admit Weight 376 lb 8 oz (170.8 kg)    Goal Weight: Short Term 370 lb (167.8 kg)    Goal Weight: Long Term 340 lb (154.2 kg)    Expected Outcomes Short Term: Continue to assess and modify interventions until short term weight is achieved;Long Term: Adherence to nutrition and physical activity/exercise program aimed toward attainment of established weight goal;Weight Loss: Understanding of general recommendations for a balanced deficit meal plan, which promotes 1-2 lb weight loss per week and includes a negative energy balance of [id number] kcal/d;Understanding recommendations for meals to include 15-35% energy as protein, 25-35% energy from fat, 35-60% energy from carbohydrates, less than 200mg  of dietary cholesterol, 20-35 gm of total fiber daily;Understanding of distribution of calorie intake throughout the day with the consumption of 4-5 meals/snacks    Improve shortness of breath with ADL's Yes    Intervention Provide education, individualized exercise plan and daily activity instruction to help decrease symptoms of SOB with activities of daily living.    Expected Outcomes Short Term: Improve cardiorespiratory fitness to achieve a reduction of symptoms when performing ADLs;Long Term: Be able to perform more ADLs without symptoms or delay the onset of symptoms    Increase knowledge  of respiratory medications and ability to use respiratory devices properly  Yes    Intervention Provide education and demonstration as needed of appropriate use of medications, inhalers, and oxygen therapy.  Expected Outcomes Short Term: Achieves understanding of medications use. Understands that oxygen is a medication prescribed by physician. Demonstrates appropriate use of inhaler and oxygen therapy.;Long Term: Maintain appropriate use of medications, inhalers, and oxygen therapy.    Diabetes Yes    Intervention Provide education about signs/symptoms and action to take for hypo/hyperglycemia.;Provide education about proper nutrition, including hydration, and aerobic/resistive exercise prescription along with prescribed medications to achieve blood glucose in normal ranges: Fasting glucose 65-99 mg/dL    Expected Outcomes Short Term: Participant verbalizes understanding of the signs/symptoms and immediate care of hyper/hypoglycemia, proper foot care and importance of medication, aerobic/resistive exercise and nutrition plan for blood glucose control.;Long Term: Attainment of HbA1C < 7%.    Hypertension Yes    Intervention Provide education on lifestyle modifcations including regular physical activity/exercise, weight management, moderate sodium restriction and increased consumption of fresh fruit, vegetables, and low fat dairy, alcohol moderation, and smoking cessation.;Monitor prescription use compliance.    Expected Outcomes Short Term: Continued assessment and intervention until BP is < 140/76mm HG in hypertensive participants. < 130/32mm HG in hypertensive participants with diabetes, heart failure or chronic kidney disease.;Long Term: Maintenance of blood pressure at goal levels.             Education:Diabetes - Individual verbal and written instruction to review signs/symptoms of diabetes, desired ranges of glucose level fasting, after meals and with exercise. Acknowledge that pre and post  exercise glucose checks will be done for 3 sessions at entry of program. Flowsheet Row Pulmonary Rehab from 02/02/2023 in [facility]  Date 02/02/23  Educator [facility]  Instruction Review Code 1- Verbalizes Understanding       Know Your Numbers and Heart Failure: - Group verbal and visual instruction to discuss disease risk factors for cardiac and pulmonary disease and treatment options.  Reviews associated critical values for Overweight/Obesity, Hypertension, Cholesterol, and Diabetes.  Discusses basics of heart failure: signs/symptoms and treatments.  Introduces Heart Failure Zone chart for action plan for heart failure.  Written material given at graduation.   Core Components/Risk Factors/Patient Goals Review:    Core Components/Risk Factors/Patient Goals at Discharge (Final Review):    ITP Comments:  ITP Comments     Row Name 01/28/23 1036 02/02/23 1734 02/02/23 1756 02/04/23 0757 02/11/23 1744   ITP Comments Virtual orientation call completed today. shehas an appointment on Date: [id number]  for EP eval and gym Orientation.  Documentation of diagnosis can be found in [facility] Date: 01/15/2023 . Completed 6MWT and gym orientation. Initial ITP created and sent for review to Dr. Zetta Bills, Medical Director. Patient reports that her Blood sugars are always in the 400-500 range. A clearance note will be sent to her endocrinologist before starting pulmonary rehab since patient states she will never be able to get at BG below 300, which is our program guideline. She reports this is due to her carcer treatments. 30 Day review completed. Medical Director ITP review done, changes made as directed, and signed approval by Medical Director.    new to program     patient has not started until blood sugar ranges approved by MD Ralyn's endocrinologist reported back that she needs to come into the office for an appointment to get her blood sugars under control before starting pulmonary rehab.  Patient was contacted and stated that she currently has an appointment in May but will try to get in sooner if she can. Will re-evaluate patient's readiness to participate in pulmonary rehab after this  appointment.            Comments: discharge ITP

## 2023-02-12 ENCOUNTER — Ambulatory Visit: Admit: 2023-02-12 | Discharge: 2023-02-13 | Payer: MEDICAID

## 2023-02-12 DIAGNOSIS — K7581 Nonalcoholic steatohepatitis (NASH): Principal | ICD-10-CM
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
## 2023-02-12 NOTE — Unmapped (Signed)
Chronic, worsening over the past few months. Followed by Pulmonology. Has had PFTs previously, Pulm planning for repeat at next visit. 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 No asthma diagnosis.  Recent sleep study echoed previous sleep studies which were not consistent with [name]. Echo done 12/31/22 with normal EF, only significant for mildly elevated right atrial pressure in the setting of mild Pulmonary HTN. Most likely multifactorial due to OHS, deconditioning, volume overload. Outpatient pulmonary follow up and rehab planned.  Patient was on an asthma treatment plan in the past however this was stopped because it did not help and no obstructive airway disease noted on exam.  I did give patient an albuterol inhaler to take as needed.  Recommend she use an incentive spirometer to exercise her muscles of respiration to see if this will help with some of her restrictive lung disease.  As well I recommend she start exercising again.  She can use her home pulse oximeter and as long as her O2 sat stays above 92% she can continue exercising to improve her endurance.

## 2023-02-12 NOTE — Unmapped (Signed)
Bp under good control taking valsartan 160mg  daily, atenolol 100mg  daily, and torsemide 20mg  alternating with 40mg 

## 2023-02-12 NOTE — Unmapped (Signed)
Your #1 job is to drink water!!      We'll get you scheduled for a FibroScan.      You do not have cirrhosis or portal hypertension.       Repeat MRI in the summer.

## 2023-02-12 NOTE — Unmapped (Signed)
Alk phos elevated, similar to prior. CT CAP during ED visit 02/06/21 showed variably enhancing liver lesions, most consistent with hemangioma, follow-up MRI abdomen 02/26/21 showed multisegmental solid hepatic neoplasia, favoring FNH and hepatic adenomatosis in setting of NAFLD. 12/14/21 CTAP noted normal liver contour, no focal liver lesions.  Abdominal ultrasound performed last week showed hepatomegaly and mild splenomegaly.  No mention of ascites.  Patient is working on scheduling with hepatology for evaluation.  Needs to work on weight and calorie restriction.

## 2023-02-12 NOTE — Unmapped (Signed)
Management as per endocrine.   Recently increased Mounjaro current regimen includes metformin 1000 mg bid, mounjaro, Humulin R U500 110 units every morning and 100 every evening.  Patient does not have sliding scale insulin instructions.  I think she would benefit from that.Upon evaluation, it was observed that her glycemic control has shown improvement, however, it has not yet reached the desired level. The patient was advised to monitor her blood glucose levels prior to attending pulmonary rehabilitation. If the readings are elevated, she may administer sliding scale insulin. It was recommended that she consult with her endocrinologist, for further guidance on this matter. She was also encouraged to engage in physical activities such as walking around the house while wearing a pulse oximeter, aiming to maintain oxygen saturation above 92 percent.  Additionally there are several sit and be fit exercises she can do at home while waiting to enroll in pulmonary rehab.

## 2023-02-13 MED ORDER — MOUNJARO 7.5 MG/0.5 ML SUBCUTANEOUS PEN INJECTOR
SUBCUTANEOUS | 0 refills | 0 days | Status: CP
Start: 2023-02-13 — End: 2023-03-07
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
## 2023-02-16 DIAGNOSIS — Z794 Long term (current) use of insulin: Principal | ICD-10-CM

## 2023-02-16 DIAGNOSIS — E119 Type 2 diabetes mellitus without complications: Principal | ICD-10-CM

## 2023-02-16 MED ORDER — HUMULIN R U-500 (CONC) INSULIN KWIKPEN 500 UNIT/ML (3 ML) SUBCUTANEOUS
0 refills | 0 days
Start: 2023-02-16 — End: ?

## 2023-02-16 MED ORDER — TECHLITE PEN NEEDLE 32 GAUGE X 1/4" (6 MM)
4 refills | 0 days
Start: 2023-02-16 — End: ?

## 2023-02-18 MED ORDER — TECHLITE PEN NEEDLE 32 GAUGE X 1/4" (6 MM)
4 refills | 0 days | Status: CP
Start: 2023-02-18 — End: ?

## 2023-02-18 MED ORDER — HUMULIN R U-500 (CONC) INSULIN KWIKPEN 500 UNIT/ML (3 ML) SUBCUTANEOUS
0 refills | 0 days | Status: CP
Start: 2023-02-18 — End: ?

## 2023-02-19 ENCOUNTER — Ambulatory Visit: Admit: 2023-02-19 | Discharge: 2023-02-20 | Payer: MEDICAID

## 2023-02-19 NOTE — Unmapped (Signed)
Schedule right heart catheterization

## 2023-02-23 MED ORDER — MOUNJARO 5 MG/0.5 ML SUBCUTANEOUS PEN INJECTOR
SUBCUTANEOUS | 3 refills | 0 days | Status: CP
Start: 2023-02-23 — End: 2023-03-17

## 2023-03-02 ENCOUNTER — Ambulatory Visit: Admit: 2023-03-02 | Discharge: 2023-03-03 | Payer: MEDICAID

## 2023-03-03 NOTE — Unmapped (Signed)
[facility] Refill Coordination Note    Specialty Medication(s) to be Shipped:   Hematology/Oncology: Bosulif    Other medication(s) to be shipped: No additional medications requested for fill at this time     Jenna Mosley, DOB: 01-09-1991  Phone: [phone] (home) [phone] (work)      All above HIPAA information was verified with patient.     Was a Nurse, learning disability used for this call? No    Completed refill call assessment today to schedule patient's medication shipment from the [facility] [phone]).  All relevant notes have been reviewed.     Specialty medication(s) and dose(s) confirmed: Regimen is correct and unchanged.   Changes to medications: Sargun reports no changes at this time.  Changes to insurance: No  New side effects reported not previously addressed with a pharmacist or physician: None reported  Questions for the pharmacist: No    Confirmed patient received a Conservation officer, historic buildings and a Surveyor, mining with first shipment. The patient will receive a drug information handout for each medication shipped and additional FDA Medication Guides as required.       DISEASE/MEDICATION-SPECIFIC INFORMATION        N/A    SPECIALTY MEDICATION ADHERENCE     Medication Adherence    Patient reported X missed doses in the last month: 0  Specialty Medication: Bosulif 500mg   Patient is on additional specialty medications: No  Informant: patient     Were doses missed due to medication being on hold? No    Bosulif 500mg : 5 days of medicine on hand       REFERRAL TO PHARMACIST     Referral to the pharmacist: Not needed      [facility]     Shipping address confirmed in Epic.     Delivery Scheduled: Yes, Expected medication delivery date: 03/05/23.     Medication will be delivered via Next Day Courier to the prescription address in Epic [place].    [name] [name]   [facility] Specialty Technician

## 2023-03-04 ENCOUNTER — Ambulatory Visit: Admit: 2023-03-04 | Discharge: 2023-03-04 | Payer: MEDICAID

## 2023-03-04 DIAGNOSIS — Z79899 Other long term (current) drug therapy: Principal | ICD-10-CM

## 2023-03-04 MED ORDER — SPIRONOLACTONE 25 MG TABLET
ORAL_TABLET | Freq: Every day | ORAL | 0 refills | 30.00000 days | Status: CP
Start: 2023-03-04 — End: 2023-04-03

## 2023-03-04 MED ORDER — TORSEMIDE 40 MG TABLET
ORAL_TABLET | Freq: Every day | ORAL | 0 refills | 30 days | Status: CP
Start: 2023-03-04 — End: 2023-04-03

## 2023-03-04 MED ADMIN — lidocaine (PF) (XYLOCAINE-MPF) 20 mg/mL (2 %) injection: @ 14:00:00 | Stop: 2023-03-04

## 2023-03-04 MED ADMIN — heparin (porcine) in NS Manifold Flush: @ 14:00:00 | Stop: 2023-03-04

## 2023-03-04 MED ADMIN — midazolam (VERSED) injection: INTRAVENOUS | @ 14:00:00 | Stop: 2023-03-04

## 2023-03-04 MED FILL — BOSULIF 500 MG TABLET: 30 days supply | Qty: 30 | Fill #1

## 2023-03-04 NOTE — Unmapped (Signed)
Cardiac Catheterization Laboratory  [place] [facility] [place]  Tel: [phone] - Fax: [phone]     PRELIMINARY CARDIAC CATHETERIZATION REPORT  (Full report to follow within 72 hours)  ____________________________________________________________________________     Findings:  Elevated right- and left-sided filling pressures.  Moderate pulmonary hypertension.  Normal cardiac output and index.  No significant transhepatic pressure gradient.    Recommendations:  Medical management of HFpEF.  ____________________________________________________________________________                Date of cardiac procedure: 03/04/2023    Pre-op diagnosis: c/f pulmonary hypertension vs HFpEF  Post-op diagnosis: same     Performing Service:  Cardiology  Surgeons and Role:     * Autumn Messing, MD - Primary    Procedures performed:   Right heart catheterization    Venous access site: right brachial vein    Right heart catheterization and hemodynamics:  RA (12), RV 54/15, PA 58/25 (31), W (20)  CO 8.4, CI 3.0  Hepatic wedge 17, hepatic vein 16  ____________________________________________________________________________                No specimens were taken.  Estimated blood loss: minimal.     I was present during the entire procedure.    Kathreen Devoid Verdis Frederickson, MD, [facility]  Assistant Professor of Medicine  Division of Cardiology  [place] [facility]    03/04/2023 10:31 AM

## 2023-03-04 NOTE — Unmapped (Signed)
CARDIOLOGY PROCEDURE PROGRESS NOTE:   Admit Date: March 04, 2023         Attending: No att. providers found    Primary Service: Cardiology [facility])    Admitting Diagnosis:  Pulmonary hypertension (CMS-HCC) [I27.20]    Patient seen and evaluated post procedure.  I agree with H&P as recorded.    Right brachial venous puncture site with dressing clean, dry, intact.  No bleeding or hematoma noted at puncture site.  Dry dressing distress.  Is in place.     Procedure:  03/04/2023 right heart catheterization..         ASSESSMENT AND PLAN      ASSESSMENT AND PLAN:    Jenna Mosley is a 32 y.o.-year-old female with pulmonary hypertension who is admitted s/p right heart catheterization.     1.  Pulmonary hypertension s/p right heart catheterization  * Monitor on telemetry.  * Vital signs per protocol.   * Monitor  brachial  venous access site(s) for bleeding or hematoma.  *Start spironolactone 12.5 mg by mouth once daily.  *Increase torsemide to 40 mg by mouth once daily.  *BMP in 1 week.  * Up ad lib after bedrest complete.  *Follow-up with Dr. Verdis Frederickson in 4 to 6 weeks as scheduled.        DISPO:  * Anticipate discharge after patient hemodynamically stable post-procedure.      FEN:    * IVF: none   * Monitor/replete electrolytes prn    DVT/GI Prophylaxis:  * PPI ppx: GI proph not indicated as patient to be discharged <24 hours post-procedure.   * DVT ppx: anticoagulation as above and encourage ambulation after bedrest complete.        BEST PRACTICE:  BB: yes  If No-Reason/Contraindication: N/A    Statin: no  If No-Reason/Contraindication: N/A    ASA: no  If No-Reason/Contraindication: N/A    Antiplatelet agent: no   If No-Reason/Contraindication: N/A   Cardiac Rehab Referral: no   Smoking Cessation Counseling: YES  Patient was counseled on smoking cessation and/or continued abstinence from tobacco as a strategy to reduce risk of cardiovascular disease and events. Patient participated in the counseling and verbalized understanding of the serious risks of using nicotine products.       Lab Results   Component Value Date    WBC 8.6 01/29/2023    HGB 12.1 01/29/2023    HCT 36.5 01/29/2023    PLT 289 01/29/2023       Lab Results   Component Value Date    NA 132 (L) 01/29/2023    K 4.7 01/29/2023    CL 100 01/29/2023    CO2 24.0 01/29/2023    BUN 18 01/29/2023    CREATININE 1.04 (H) 01/29/2023    GLU 427 (HH) 01/29/2023    CALCIUM 9.7 01/29/2023    MG 2.6 01/04/2023    PHOS 4.7 08/08/2019       Lab Results   Component Value Date    LABPROT 9.8 08/23/2013    INR 1.07 12/31/2022    APTT 27.7 08/02/2019       Lab Results   Component Value Date    LDL 81 08/26/2022       Lab Results   Component Value Date    A1C 8.2 (H) 12/03/2022      Jenna Mosley, ACNP-AG  March 04, 2023 11:13 AM

## 2023-03-04 NOTE — Unmapped (Signed)
Cardiac Catheterization Laboratory  [place], [place]  Tel: [phone]     Fax: [phone]    HISTORY & PHYSICAL ASSESSMENT    PCP:  Harlow Mares, MD  Phone: [phone]  Fax: [phone]    Referring physicians: Referring, None Per Patient  [address]  [place],  [place] [address]     Primary cardiologist: Gwynne Edinger, MD     Procedures to be performed:   Right heart catheterization    Indication: Suspected pulmonary hypertension    Consent: I hereby certify that the nature, purpose, benefits, usual and most frequent risks of, and alternatives to, the operation or procedure have been explained to the patient (or person authorized to sign for the patient) either by a physician or by the provider who is to perform the operation or procedure, that the patient has had an opportunity to ask questions, and that those questions have been answered. The patient or the patient's representative has been advised that selected tasks may be performed by assistants to the primary health care provider(s). I believe that the patient (or person authorized to sign for the patient) understands what has been explained, and has consented to the operation or procedure.  _____________________________________________________________________    HISTORY: This is a 31 y.o. female with PMH of hypertension, diabetes mellitus type 2, [name], NASH, AKI, restrictive lung disease, [name] and obesity who presents today for right heart catheterization..    The patient underwent an echocardiogram on 01/01/2023 which revealed normal LV systolic function and mildly elevated right atrial pressure in the setting of mild pulmonary hypertension.  She has been experiencing progressively worsening dyspnea; therefore, right heart catheterization was scheduled for further evaluation.    TTE: (01/01/23)  Summary    1. The left ventricle is normal in size with normal wall thickness.    2. The left ventricular systolic function is normal, LVEF is visually  estimated at > 55%.    3. The right ventricle is normal in size, with normal systolic function.    4. There are no significant valvular abnormalities.    Brief labs:  Lab Results   Component Value Date    HGB 12.1 01/29/2023    Hemoglobin 10.7 (L) 04/07/2020    Platelet 289 01/29/2023    Platelet 404 12/21/2014    Creatinine Whole Blood, POC 0.8 03/06/2014    Creatinine 1.04 (H) 01/29/2023    Creatinine 0.55 (L) 01/05/2015    INR 1.07 12/31/2022    INR 0.9 08/23/2013    Troponin I <0.300 10/24/2019    Troponin I <0.034 08/23/2013       Other history includes:  Previous smoker; Quit in 2014, smoked about 0.5 per day.  No known history of prior PCI.  No known history of prior MI.  No known history of prior CABG.  No known heart failure.  No cardiac arrest surrounding this admission.    Assessments:  ECG: normal   Stress Test: No stress test performed  No new antiarrhythmic therapy initiated prior to cath lab.  No cardiac CTA performed  No prior angio WITHOUT intervention.  An EF of >55% was obtained on 01/01/23.   No Agatston coronary calcium score was assessed.  CSHA Clinical Frailty Scale: 4 - Vulnerable  Chest Pain Assessment: asymptomatic   Cardiovascular Instability: none    Pre-procedure medications administered: N/a     Medications contraindicated: N/A     OBJECTIVE  BP 123/62  - Pulse 73  - Temp  37.2 ??C (99 ??F) (Oral)  - Ht 182.9 cm (6')  - Wt (!) 169.6 kg (374 lb)  - SpO2 98%  - BMI 50.72 kg/m??   Physical Exam   Constitutional: She appears chronically ill.   HENT:   Mouth/Throat: Oropharynx is clear.   Eyes: Pupils are equal, round, and reactive to light. Conjunctivae are normal.   Neck: No JVD present.   Cardiovascular: Normal rate, regular rhythm, S1 normal and S2 normal.   No murmur heard.  Pulses:       Radial pulses are 2+ on the right side and 2+ on the left side.        Posterior tibial pulses are 2+ on the right side and 2+ on the left side.   Capillary refill <3 seconds.   Pulmonary/Chest: Effort normal and breath sounds normal. She has no wheezes. She has no rales. She exhibits no tenderness.   Abdominal: Soft. Bowel sounds are normal.   Rounded, obese    Musculoskeletal:         General: No edema. Normal range of motion.      Cervical back: Normal range of motion and neck supple.   Neurological: She is alert and oriented to person, place, and time.   Skin: Skin is warm and dry.        Labs and imaging were reviewed.     Calculated CV Bleeding Risk Score: 40 (03/04/2023  9:08 AM)  Patient is a moderate bleeding risk.       Strategies used to mitigate risk include: Give preference to radial approach, micropuncture for femoral access, and limit heparin administration as able.     Haydn Hutsell, ACNP-AG  March 04, 2023 9:19 AM

## 2023-03-04 NOTE — Unmapped (Signed)
[facility] HOSPITALS  DEPARTMENT OF CARDIOLOGY  RIGHT HEART CATH DISCHARGE INSTRUCTIONS    SIGNS AND SYMPTOMS TO REPORT:   If you have any of the following, please call us at [phone] Monday to Friday 8:00-5:00:  A fever above 100 degrees F  Pain not relieved by acetaminophen (Tylenol)  Shortness of breath, dizziness, difficulty swallowing or talking  The brachial site has pus or foul-smelling drainage, is red, swollen, or feels hot to touch.  If bleeding occurs, apply light pressure on top of brachial dressing with your hand for about 15 minutes. If you are unable to stop the bleeding after 15 minutes, call 911 and continue holding pressure until help arrives.    ACTIVITY:  Leave brachial bandage on for 6 hours.  Apply light pressure to brachial dressing if you cough or sneeze.  Use Tylenol (Acetominophen), for minor pain at the brachial dressing site.  Take a tub bath or sponge bath only. Do not shower for 24 hours.    If you have any questions of concerns call the Cardiac Catheterization lab at [phone]. This phone number will be answered Monday-Friday from 7:00 am to 5:30 pm. After hours or on weekends, call the main hospital phone number at [phone] and ask for the Cardiology Fellow on-call.
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
## 2023-03-09 ENCOUNTER — Ambulatory Visit: Admit: 2023-03-09 | Discharge: 2023-03-10 | Payer: MEDICAID

## 2023-03-09 ENCOUNTER — Ambulatory Visit: Admit: 2023-03-09 | Discharge: 2023-03-10 | Payer: MEDICAID | Attending: Internal Medicine | Primary: Internal Medicine

## 2023-03-09 DIAGNOSIS — K7581 Nonalcoholic steatohepatitis (NASH): Principal | ICD-10-CM

## 2023-03-09 DIAGNOSIS — J984 Other disorders of lung: Principal | ICD-10-CM

## 2023-03-09 NOTE — Unmapped (Signed)
[place] DIVISION OF GASTROENTEROLOGY AND HEPATOLOGY  [facility] LIVER CENTER    FIBROSCAN will be performed to assess hepatic fibrosis (scarring) in order to stage this patient's liver disease. This will assist with evaluating the natural course of the disease and will provide important information regarding prognosis, duration of therapy, and potential response to treatment. This information will also help assess risk for hepatocellular carcinoma and need for liver cancer surveillance.    FibroscanProcedure:  After obtaining verbal consent, the patient was placed in a supine position. Physical characteristics, body habitus, and landmarks were assessed to establish appropriate midaxillary intercostal space for probe placement.    FibroScan 630 Expert Serial # [id number]    Probe  []  M+ Serial # [id number]  [x]  XL+ Serial # [id number]    Main Etiology of Liver Disease:  [] Hepatitis C (HCV)     [] Hepatitis B (HBV)  [] Alcohol  [] NAFLD     [] PBC       [] PSC  [] Autoimmune Hepatitis       [] Elevated hepatic enzymes       [x] Other            NASH                        Skin to liver capsule distance and liver parenchyma were accessed during the entire examination with the FibroScan probe. The patient was instructed to breathe normally and to abstain from sudden movements during the procedure, allowing for random measurements of liver stiffness. 50Hz  shear wave pulses were applied and the resulting shear wave and propagation speed detected with a 3.5 MHz ultrasonic signal, using the FibroScan probe.     Unable to obtain valid readings. Patient and probe repositioned in attempt to get readings. Patient body habitus prohibited the readings.       At least ten Shear Waves were produced; individual measurements of each shear wave were calculated. Patient tolerated the procedure well and was discharged without incident.    FibroScan score __N/A__ kPa   IQR/Med __N/A__ %   CAP __N/A__ dB/m     Test performed by: T.Zonnique Norkus,LPN    --------------------------------------------------------------------  RESULTS REPORT - Physician???s interpretation    Estimation of the stage of liver fibrosis (Metavir Score): The results of the Liver Stiffness Score are consistent with the following liver fibrosis stage:    []  F0-F1  []  F2   []  F3   [] F4           GENERAL RECOMMENDATIONS ACCORDING TO THE STAGE OF LIVER FIBROSIS.    F0-F1: No-minimal fibrosis. The risk of progression to advanced fibrosis and cirrhosis is low. If the cause of liver disease is not removed, a 1-2 yr follow-up study is recommended. F2: Significant fibrosis. There is a moderate risk of progression to cirrhosis. If the cause of liver disease is not removed, a follow-up study in 12 months is recommended.  F3: Advanced (pre-cirrhotic stage). The risk of progression to cirrhosis is high. Imaging studies to rule out hepatocellular carcinoma should be considered. Efforts to remove the cause of liver disease are highly recommended.  F4: Cirrhosis. There is significant risk of portal hypertension and esophageal varices. An upper endoscopy is recommended. Imaging studies for hepatocellular carcinoma screening are recommended.    Any and all FibroScan studies must be carefully evaluated, taking fully into account all individual measurement/scans, patient history and other factors. As with liver biopsy, any estimation of liver fibrosis may be subject to under or over staging  due to sampling error. Any further medical or surgical intervention should be made only while fully considering the circumstances of this patient and in consultation with this patient.    I have reviewed and interpreted the FIBROSCAN test results as described above.       No reading available.  Consider liver biopsy if clinically important.    AYolonda Kida, MD, [facility]  Professor of Medicine  Division of Gastroenterology and Hepatology  [facility]  [address]  [id number]  [place], [place] [address]    Phone [phone]  Fax [phone]

## 2023-03-09 NOTE — Unmapped (Signed)
Please take your medications as prescribed.      Thank you for allowing me to be a part of your care. Please call the clinic with any questions.     Between appointments, you can reach us at these numbers:    For Appointments: [phone]     For urgent issues after hours: Call the hospital operator ([phone]) and ask for the "Pulmonary Fellow On Call"    Clinic Fax: Fax: [phone]    Some information about contacting us through My [place] Chart:    My [place] Chart is for non-urgent messages. This means you have a simple medical question that does not require an immediate response.    If you need immediate attention, call 911. If you have an non-emergent but urgent issue that you feel needs a response the same day, you should contact your primary care provider or be evaluated at an Urgent Care clinic.    Responses may take up to 3 business days. Your message will be read by your provider or another medical team member who may respond on your provider's behalf. We will do our best to respond as quickly as possible, as your message is important to us. However, many of our providers have other duties (inpatient hospital work, research activities, teaching) that may make them not available for same day responses. If you have sent a MyChart message to the clinic and have not received a response after three business days, please call us at [phone] to speak to a nurse.    Some questions cannot be answered through messages in My [place] Chart. Depending on your question, your provider's office may ask you to schedule an appointment.     Information sent through My [place] Chart will become part of your medical record.    Thank you for your visit to the [facility].  You may receive a survey from [facility] regarding your visit today, and we are eager to use this feedback to improve your experience.  Thank you for taking the time to fill it out.

## 2023-03-10 ENCOUNTER — Institutional Professional Consult (permissible substitution): Admit: 2023-03-10 | Discharge: 2023-03-10 | Payer: MEDICAID

## 2023-03-10 ENCOUNTER — Ambulatory Visit: Admit: 2023-03-10 | Discharge: 2023-03-10 | Payer: MEDICAID

## 2023-03-10 DIAGNOSIS — R809 Proteinuria, unspecified: Principal | ICD-10-CM

## 2023-03-10 DIAGNOSIS — E1165 Type 2 diabetes mellitus with hyperglycemia: Principal | ICD-10-CM

## 2023-03-10 DIAGNOSIS — Z79899 Other long term (current) drug therapy: Principal | ICD-10-CM

## 2023-03-10 DIAGNOSIS — E875 Hyperkalemia: Principal | ICD-10-CM

## 2023-03-10 DIAGNOSIS — Z794 Long term (current) use of insulin: Principal | ICD-10-CM

## 2023-03-10 LAB — BASIC METABOLIC PANEL
ANION GAP: 8 mmol/L (ref 5–14)
BLOOD UREA NITROGEN: 55 mg/dL — ABNORMAL HIGH (ref 9–23)
BUN / CREAT RATIO: 33
CALCIUM: 10.4 mg/dL (ref 8.7–10.4)
CHLORIDE: 102 mmol/L (ref 98–107)
CO2: 21.7 mmol/L (ref 20.0–31.0)
CREATININE: 1.68 mg/dL — ABNORMAL HIGH
EGFR CKD-EPI (2021) FEMALE: 41 mL/min/{1.73_m2} — ABNORMAL LOW (ref >=60)
GLUCOSE RANDOM: 437 mg/dL (ref 70–179)
POTASSIUM: 5.7 mmol/L — ABNORMAL HIGH (ref 3.4–4.8)
SODIUM: 132 mmol/L — ABNORMAL LOW (ref 135–145)

## 2023-03-10 LAB — HEMOGLOBIN A1C
ESTIMATED AVERAGE GLUCOSE: 258 mg/dL
HEMOGLOBIN A1C: 10.6 % — ABNORMAL HIGH (ref 4.8–5.6)

## 2023-03-10 MED ORDER — HUMULIN R U-500 (CONC) INSULIN KWIKPEN 500 UNIT/ML (3 ML) SUBCUTANEOUS
Freq: Two times a day (BID) | SUBCUTANEOUS | 3 refills | 90 days | Status: CP
Start: 2023-03-10 — End: 2024-03-09

## 2023-03-10 NOTE — Unmapped (Signed)
So we went [facility] lab and they said that Dr. Leretha Dykes lab were not in the computer.   Please advise? (My Chart message from pt to Dr. Verdis Frederickson)    Hgb A1C and BMP were drawn today at 12:17 PM.  Please note K level and Creatinine level
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
## 2023-03-10 NOTE — Unmapped (Signed)
Spoke to the patient regarding her recent BMP that was remarkable for a potassium of 5.7 and a glucose of 437.  I informed her that after discussing the case with Dr. Verdis Frederickson, we will stop the spironolactone at this time.  We will recheck her BMP this week.  She verbalized understanding of all information and reports that she is unsure at this time whether or not she will be able to get to the lab on either Thursday or Friday, but she reassures me that she will be able to go on one of those days for the recheck. Additionally, she informed me that she was seen by her endocrinologist on today home and addressed her elevated CBG.
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
## 2023-03-10 NOTE — Unmapped (Addendum)
Your goal fasting glucose is 100-150  Your goal glucose 2h after meals is less than 180  Adjust breakfast/morning U500 to 130 units with breakfast. Monitor your 2h post meal glucose for 2 days, if still above 180, can increase morning dose of U500 by 5 units every 2 days until your sugars 2h after meals are less than 180.   Adjust dinner/evening U500 to 120 units with dinner. Monitor your  fasting glucose for 2 days on this dose, if fasting glucose remains above target 100-150, then can increase dose by 5 units every 2 days until your fasting glucose is at goal 100-150.

## 2023-03-10 NOTE — Unmapped (Signed)
Dexcom linked to clarity, last date uploaded was 02/21/2023. POC glucose done today. PP 9am. 377 mg/dL.

## 2023-03-11 DIAGNOSIS — E1165 Type 2 diabetes mellitus with hyperglycemia: Principal | ICD-10-CM

## 2023-03-11 DIAGNOSIS — E119 Type 2 diabetes mellitus without complications: Principal | ICD-10-CM

## 2023-03-11 DIAGNOSIS — Z794 Long term (current) use of insulin: Principal | ICD-10-CM

## 2023-03-11 MED ORDER — ALCOHOL SWABS
Freq: Three times a day (TID) | TOPICAL | 3 refills | 0 days | Status: CP
Start: 2023-03-11 — End: ?

## 2023-03-11 MED ORDER — ACCU-CHEK GUIDE TEST STRIPS
11 refills | 0 days | Status: CP
Start: 2023-03-11 — End: ?

## 2023-03-11 MED ORDER — LANCETS
11 refills | 0 days | Status: CP
Start: 2023-03-11 — End: ?

## 2023-03-13 ENCOUNTER — Ambulatory Visit: Admit: 2023-03-13 | Discharge: 2023-03-14 | Payer: MEDICAID

## 2023-03-13 ENCOUNTER — Ambulatory Visit
Admit: 2023-03-13 | Discharge: 2023-03-15 | Disposition: A | Discharge status: Home / Self Care | Payer: MEDICAID | Admitting: Internal Medicine

## 2023-03-13 LAB — CBC W/ AUTO DIFF
BASOPHILS ABSOLUTE COUNT: 0 10*9/L (ref 0.0–0.1)
BASOPHILS ABSOLUTE COUNT: 0.2 10*9/L — ABNORMAL HIGH (ref 0.0–0.1)
BASOPHILS RELATIVE PERCENT: 0.2 %
BASOPHILS RELATIVE PERCENT: 1.8 %
EOSINOPHILS ABSOLUTE COUNT: 0.2 10*9/L (ref 0.0–0.5)
EOSINOPHILS ABSOLUTE COUNT: 0.2 10*9/L (ref 0.0–0.5)
EOSINOPHILS RELATIVE PERCENT: 1.8 %
EOSINOPHILS RELATIVE PERCENT: 2 %
HEMATOCRIT: 36.8 % (ref 34.0–44.0)
HEMATOCRIT: 35.7 % (ref 34.0–44.0)
HEMOGLOBIN: 12.2 g/dL (ref 11.3–14.9)
HEMOGLOBIN: 12 g/dL (ref 11.3–14.9)
LYMPHOCYTES ABSOLUTE COUNT: 2.8 10*9/L (ref 1.1–3.6)
LYMPHOCYTES ABSOLUTE COUNT: 3.4 10*9/L (ref 1.1–3.6)
LYMPHOCYTES RELATIVE PERCENT: 29.9 %
LYMPHOCYTES RELATIVE PERCENT: 35.2 %
MEAN CORPUSCULAR HEMOGLOBIN CONC: 33.1 g/dL (ref 32.0–36.0)
MEAN CORPUSCULAR HEMOGLOBIN CONC: 33.7 g/dL (ref 32.0–36.0)
MEAN CORPUSCULAR HEMOGLOBIN: 28.1 pg (ref 25.9–32.4)
MEAN CORPUSCULAR HEMOGLOBIN: 28.7 pg (ref 25.9–32.4)
MEAN CORPUSCULAR VOLUME: 84.9 fL (ref 77.6–95.7)
MEAN CORPUSCULAR VOLUME: 85.1 fL (ref 77.6–95.7)
MEAN PLATELET VOLUME: 8.1 fL (ref 6.8–10.7)
MEAN PLATELET VOLUME: 8.4 fL (ref 6.8–10.7)
MONOCYTES ABSOLUTE COUNT: 0.4 10*9/L (ref 0.3–0.8)
MONOCYTES ABSOLUTE COUNT: 0.5 10*9/L (ref 0.3–0.8)
MONOCYTES RELATIVE PERCENT: 4.7 %
MONOCYTES RELATIVE PERCENT: 5.2 %
NEUTROPHILS ABSOLUTE COUNT: 6.1 10*9/L (ref 1.8–7.8)
NEUTROPHILS ABSOLUTE COUNT: 5.3 10*9/L (ref 1.8–7.8)
NEUTROPHILS RELATIVE PERCENT: 63.4 %
NEUTROPHILS RELATIVE PERCENT: 55.8 %
NUCLEATED RED BLOOD CELLS: 0 /100{WBCs} (ref <=4)
NUCLEATED RED BLOOD CELLS: 0 /100{WBCs} (ref <=4)
PLATELET COUNT: 260 10*9/L (ref 150–450)
PLATELET COUNT: 252 10*9/L (ref 150–450)
RED BLOOD CELL COUNT: 4.34 10*12/L (ref 3.95–5.13)
RED BLOOD CELL COUNT: 4.2 10*12/L (ref 3.95–5.13)
RED CELL DISTRIBUTION WIDTH: 14.7 % (ref 12.2–15.2)
RED CELL DISTRIBUTION WIDTH: 14.5 % (ref 12.2–15.2)
WBC ADJUSTED: 9.5 10*9/L (ref 3.6–11.2)
WBC ADJUSTED: 9.5 10*9/L (ref 3.6–11.2)

## 2023-03-13 LAB — URINALYSIS WITH MICROSCOPY WITH CULTURE REFLEX
BACTERIA: NONE SEEN /HPF
BILIRUBIN UA: NEGATIVE
BLOOD UA: NEGATIVE
GLUCOSE UA: NEGATIVE
HYALINE CASTS: 2 /LPF — ABNORMAL HIGH (ref 0–1)
KETONES UA: NEGATIVE
LEUKOCYTE ESTERASE UA: NEGATIVE
NITRITE UA: NEGATIVE
PH UA: 5 (ref 5.0–9.0)
PROTEIN UA: NEGATIVE
RBC UA: 1 /HPF (ref <=4)
SPECIFIC GRAVITY UA: 1.008 (ref 1.003–1.030)
SQUAMOUS EPITHELIAL: 1 /HPF (ref 0–5)
UROBILINOGEN UA: <2
WBC UA: <1 /HPF (ref 0–5)

## 2023-03-13 LAB — BASIC METABOLIC PANEL
ANION GAP: 11 mmol/L (ref 5–14)
ANION GAP: 11 mmol/L (ref 5–14)
BLOOD UREA NITROGEN: 71 mg/dL — ABNORMAL HIGH (ref 9–23)
BLOOD UREA NITROGEN: 55 mg/dL — ABNORMAL HIGH (ref 9–23)
BUN / CREAT RATIO: 26
BUN / CREAT RATIO: 22
CALCIUM: 10.3 mg/dL (ref 8.7–10.4)
CALCIUM: 9.9 mg/dL (ref 8.7–10.4)
CHLORIDE: 102 mmol/L (ref 98–107)
CHLORIDE: 101 mmol/L (ref 98–107)
CO2: 20.4 mmol/L (ref 20.0–31.0)
CO2: 17.7 mmol/L — ABNORMAL LOW (ref 20.0–31.0)
CREATININE: 2.72 mg/dL — ABNORMAL HIGH
CREATININE: 2.45 mg/dL — ABNORMAL HIGH
EGFR CKD-EPI (2021) FEMALE: 23 mL/min/{1.73_m2} — ABNORMAL LOW (ref >=60)
EGFR CKD-EPI (2021) FEMALE: 26 mL/min/{1.73_m2} — ABNORMAL LOW (ref >=60)
GLUCOSE RANDOM: 332 mg/dL — ABNORMAL HIGH (ref 70–179)
GLUCOSE RANDOM: 327 mg/dL — ABNORMAL HIGH (ref 70–179)
POTASSIUM: 5.9 mmol/L — ABNORMAL HIGH (ref 3.4–4.8)
POTASSIUM: 5.3 mmol/L — ABNORMAL HIGH (ref 3.4–4.8)
SODIUM: 133 mmol/L — ABNORMAL LOW (ref 135–145)
SODIUM: 130 mmol/L — ABNORMAL LOW (ref 135–145)

## 2023-03-13 LAB — CREATININE, URINE: CREATININE, URINE: 29.2 mg/dL

## 2023-03-13 LAB — SODIUM, URINE, RANDOM: SODIUM URINE: 76 mmol/L

## 2023-03-13 LAB — CORTISOL: CORTISOL TOTAL: 7.1 ug/dL

## 2023-03-13 LAB — B-TYPE NATRIURETIC PEPTIDE: B-TYPE NATRIURETIC PEPTIDE: 26.24 pg/mL (ref <=100)

## 2023-03-13 LAB — MAGNESIUM: MAGNESIUM: 2.2 mg/dL (ref 1.6–2.6)

## 2023-03-13 LAB — TSH: THYROID STIMULATING HORMONE: 0.73 u[IU]/mL (ref 0.550–4.780)

## 2023-03-13 MED ORDER — MOUNJARO 10 MG/0.5 ML SUBCUTANEOUS PEN INJECTOR
0 refills | 0 days | Status: CP
Start: 2023-03-13 — End: ?

## 2023-03-13 MED ADMIN — sodium chloride 0.9% (NS) bolus 500 mL: 500 mL | INTRAVENOUS | @ 23:00:00 | Stop: 2023-03-13

## 2023-03-13 NOTE — Unmapped (Signed)
Spoke to the patient to inform her that her potassium level continues to be significantly elevated at now 5.9 and her creatinine is greater than 2 which indicates worsening kidney functioning.  At this time we recommend that the patient to the nearest ER. She verbalized understanding of all information and states that she will proceed to the nearest ER once mother is off of her business meeting.     Dr. Gwynne Edinger made aware.

## 2023-03-14 LAB — COMPREHENSIVE METABOLIC PANEL
ALBUMIN: 3.4 g/dL (ref 3.4–5.0)
ALKALINE PHOSPHATASE: 136 U/L — ABNORMAL HIGH (ref 46–116)
ALT (SGPT): <7 U/L — ABNORMAL LOW (ref 10–49)
ANION GAP: 9 mmol/L (ref 5–14)
AST (SGOT): 12 U/L (ref <=34)
BILIRUBIN TOTAL: 0.3 mg/dL (ref 0.3–1.2)
BLOOD UREA NITROGEN: 66 mg/dL — ABNORMAL HIGH (ref 9–23)
BUN / CREAT RATIO: 27
CALCIUM: 9.7 mg/dL (ref 8.7–10.4)
CHLORIDE: 105 mmol/L (ref 98–107)
CO2: 21.1 mmol/L (ref 20.0–31.0)
CREATININE: 2.43 mg/dL — ABNORMAL HIGH
EGFR CKD-EPI (2021) FEMALE: 26 mL/min/{1.73_m2} — ABNORMAL LOW (ref >=60)
GLUCOSE RANDOM: 285 mg/dL — ABNORMAL HIGH (ref 70–179)
POTASSIUM: 5 mmol/L — ABNORMAL HIGH (ref 3.4–4.8)
PROTEIN TOTAL: 7 g/dL (ref 5.7–8.2)
SODIUM: 135 mmol/L (ref 135–145)

## 2023-03-14 MED ADMIN — pantoprazole (Protonix) EC tablet 40 mg: 40 mg | ORAL | @ 14:00:00

## 2023-03-14 MED ADMIN — ferrous sulfate tablet 325 mg: 325 mg | ORAL | @ 14:00:00

## 2023-03-14 MED ADMIN — insulin lispro (HumaLOG) injection 0-20 Units: 0-20 [IU] | SUBCUTANEOUS | @ 14:00:00

## 2023-03-14 MED ADMIN — divalproex ER (DEPAKOTE ER) extended released 24 hr tablet 500 mg: 500 mg | ORAL | @ 03:00:00

## 2023-03-14 MED ADMIN — sodium chloride 0.9% (NS) bolus 500 mL: 500 mL | INTRAVENOUS | @ 03:00:00

## 2023-03-14 MED ADMIN — ziprasidone (GEODON) capsule 80 mg: 80 mg | ORAL | @ 14:00:00

## 2023-03-14 MED ADMIN — norethindrone (AYGESTIN) tablet 5 mg: 5 mg | ORAL | @ 14:00:00

## 2023-03-14 MED ADMIN — lamoTRIgine (LaMICtal) tablet 150 mg: 150 mg | ORAL | @ 03:00:00

## 2023-03-14 MED ADMIN — lactated Ringers infusion: 75 mL/h | INTRAVENOUS | @ 22:00:00 | Stop: 2023-03-15

## 2023-03-14 MED ADMIN — insulin lispro (HumaLOG) injection 16 Units: 16 [IU] | SUBCUTANEOUS | @ 17:00:00

## 2023-03-14 MED ADMIN — insulin lispro (HumaLOG) injection 12 Units: 12 [IU] | SUBCUTANEOUS | @ 03:00:00

## 2023-03-14 MED ADMIN — lamoTRIgine (LaMICtal) tablet 150 mg: 150 mg | ORAL | @ 14:00:00

## 2023-03-14 MED ADMIN — atenolol (TENORMIN) tablet 100 mg: 100 mg | ORAL | @ 14:00:00

## 2023-03-14 MED ADMIN — insulin lispro (HumaLOG) injection 12 Units: 12 [IU] | SUBCUTANEOUS | @ 14:00:00 | Stop: 2023-03-14

## 2023-03-14 MED ADMIN — traZODone (DESYREL) tablet 200 mg: 200 mg | ORAL | @ 03:00:00

## 2023-03-14 MED ADMIN — insulin lispro (HumaLOG) injection 0-20 Units: 0-20 [IU] | SUBCUTANEOUS | @ 17:00:00

## 2023-03-14 MED ADMIN — insulin glargine (LANTUS) injection 80 Units: 80 [IU] | SUBCUTANEOUS | @ 10:00:00 | Stop: 2023-03-14

## 2023-03-14 MED ADMIN — insulin lispro (HumaLOG) injection 0-20 Units: 0-20 [IU] | SUBCUTANEOUS

## 2023-03-14 MED ADMIN — insulin glargine (LANTUS) injection 80 Units: 80 [IU] | SUBCUTANEOUS | @ 03:00:00

## 2023-03-14 MED ADMIN — insulin lispro (HumaLOG) injection 0-20 Units: 0-20 [IU] | SUBCUTANEOUS | @ 02:00:00

## 2023-03-14 MED ADMIN — ziprasidone (GEODON) capsule 80 mg: 80 mg | ORAL | @ 22:00:00
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
## 2023-03-14 NOTE — Unmapped (Signed)
Patient is admitted primarily for AKI. Patient is also observed for Hyperkalemia, Hyponatremia, and Hyperglycemia. Patient is A&O x 4. Family at bedside. VSS, cardiac monitoring, and RA. No complaint of pain and meds given as ordered. POCT and needed insulin administered per order. Standard precaution maintained, bed in low position, and call bell within reach  Problem: Adult Inpatient Plan of Care  Goal: Plan of Care Review  Outcome: Ongoing - Unchanged  Goal: Patient-Specific Goal (Individualized)  Outcome: Ongoing - Unchanged  Goal: Absence of Hospital-Acquired Illness or Injury  Outcome: Ongoing - Unchanged  Intervention: Identify and Manage Fall Risk  Recent Flowsheet Documentation  Taken 03/13/2023 2200 by Melony Overly, RN  Safety Interventions: assistive device  Intervention: Prevent Skin Injury  Recent Flowsheet Documentation  Taken 03/13/2023 2200 by Melony Overly, RN  Skin Protection: incontinence pads utilized  Taken 03/13/2023 2130 by Melony Overly, RN  Skin Protection: incontinence pads utilized  Goal: Optimal Comfort and Wellbeing  Outcome: Ongoing - Unchanged  Goal: Readiness for Transition of Care  Outcome: Ongoing - Unchanged  Goal: Rounds/Family Conference  Outcome: Ongoing - Unchanged

## 2023-03-14 NOTE — Unmapped (Signed)
[facility] Internal Medicine Hospitalist Progress Note     LOS: 0 days       Assessment/Plan:  Principal Problem:    Hyperkalemia  Active Problems:    Essential (primary) hypertension    Obstructive sleep apnea    Bipolar I disorder: With psychotic features, Current or most recent episode depressed, with mixed features (CMS-HCC)    Type 2 diabetes mellitus with diabetic neuropathy, with long-term current use of insulin (CMS-HCC)    [name] (chronic myelocytic leukemia) (CMS-HCC)    AKI (acute kidney injury) (CMS-HCC)    Shortness of breath    Pulmonary hypertension (CMS-HCC)  Resolved Problems:    * No resolved hospital problems. *       1.  Acute kidney injury and hyperkalemia likely due to recent dosing of Aldactone and subsequent volume depletion.  Potassium now improved to 5.0 although creatinine remains elevated at 2.4, appears to have baseline of 0.9-1.  -Continue to hold Aldactone, patient placed on IV fluids, encourage p.o. fluids  -A.m. BMP.  -Bedside nephrology consult not available the weekend, will contact by phone if she does not respond with improved creatinine to holding ARB and Aldactone therapy, administration of IV fluids    2.  Type 2 insulin-dependent diabetes mellitus.  -Increase Lantus and Premeal insulin, continue sliding scale, not using patient's home U-500, blood sugars elevated but improving.  She is on a consistent carbohydrate diet, expresses interest continue appropriate diabetic diet management.    3.  [name].  The patient asked that she continue her home bosutinib (BOSULIF) 500 mg Tab, will need to get clearance from Oncology per Pharmacy protocol.    4.  New diagnosis of pulmonary hypertension.  No complaint of shortness of breath, no oxygen requirement.    *DVT prophylaxis: She is ambulatory.    *Disposition: She will require another midnight in the hospital.    Subjective:       Objective:       Vital signs in last 24 hours:  Temp:  [35.7 ??C (96.2 ??F)-36.6 ??C (97.9 ??F)] 36.4 ??C (97.6 ??F)  Heart Rate:  [58-67] 65  Resp:  [16-20] 18  BP: (84-132)/(54-75) 114/56  MAP (mmHg):  [77-81] 80  SpO2:  [96 %-100 %] 96 %    Intake/Output last 24 hours:    Intake/Output Summary (Last 24 hours) at 03/14/2023 1658  Last data filed at 03/14/2023 1600  Gross per 24 hour   Intake 2160 ml   Output --   Net 2160 ml         Physical Exam:    Gen: Alert, NAD    HEENT: No icterus.    CV: Regular rate and rhythm.    PULM/chest: Clear to auscultation.    BJY:NWGN, Non tender, No palpable organomegaly    Ext: No C/C/E    Neuro: No acute focal deficits.    Skin: Warm, dry.      Medications:   Scheduled Meds:   atenolol  100 mg Oral QAM    divalproex ER  500 mg Oral Nightly    ferrous sulfate  325 mg Oral Daily    insulin glargine  100 Units Subcutaneous BID    insulin lispro  0-20 Units Subcutaneous ACHS    insulin lispro  16 Units Subcutaneous TID AC    lamoTRIgine  150 mg Oral BID    norethindrone  5 mg Oral Daily    pantoprazole  40 mg Oral QAM    traZODone  200 mg  Oral Nightly    ziprasidone  80 mg Oral BID with meals     Continuous Infusions:      Recent Labs     03/13/23  1806 03/14/23  0639   NA 130* 135   K 5.3* 5.0*   CL 101 105   CO2 17.7* 21.1   BUN 55* 66*   CREATININE 2.45* 2.43*   GLU 327* 285*   CALCIUM 9.9 9.7   ALBUMIN  --  3.4   PROT  --  7.0   BILITOT  --  0.3   AST  --  12   ALT  --  <7*   ALKPHOS  --  136*   MG 2.2  --    TSH 0.730  --      Recent Labs     03/13/23  2012   BNP 26.24     Recent Labs     03/13/23  1909   WBCUA <1   NITRITE Negative   LEUKOCYTESUR Negative   BACTERIA None Seen   RBCUA 1   BLOODU Negative   GLUCOSEU Negative   PROTEINUA Negative   KETONESU Negative       I personally spent greater than  35 minutes face-to-face and non-face-to-face in the care of this patient, which includes all pre, intra, and post visit time on the date of service.  All documented time was specific to the E/M visit and does not include any procedures that may have been performed.      Tawni Levy MD

## 2023-03-14 NOTE — Unmapped (Signed)
Ambulatory to triage.    Was called by provider to elevated K+ and Creatinine, and Low sodium.   Elevated BSG.     Pt is Leukemia pt.     K+   =  5.9  NA+ = 133  Creatinine = 2.72
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
## 2023-03-14 NOTE — Unmapped (Signed)
Pt is A & O x 4, continent, no expression of pain or discomfort, blood glucose monitored per orders, hyperglycemia noted and MD notified, nutritional insulin dose increased. Fall and skin care precautions maintained, monitoring continues.    Problem: Adult Inpatient Plan of Care  Goal: Plan of Care Review  Outcome: Progressing  Goal: Patient-Specific Goal (Individualized)  Outcome: Progressing  Goal: Absence of Hospital-Acquired Illness or Injury  Outcome: Progressing  Intervention: Identify and Manage Fall Risk  Recent Flowsheet Documentation  Taken 03/14/2023 1400 by Winona Legato, RN  Safety Interventions:   fall reduction program maintained   family at bedside   low bed   nonskid shoes/slippers when out of bed  Taken 03/14/2023 1200 by Winona Legato, RN  Safety Interventions:   fall reduction program maintained   low bed   nonskid shoes/slippers when out of bed  Taken 03/14/2023 1000 by Winona Legato, RN  Safety Interventions:   fall reduction program maintained   nonskid shoes/slippers when out of bed  Taken 03/14/2023 1610 by Winona Legato, RN  Safety Interventions:   fall reduction program maintained   low bed   family at bedside   nonskid shoes/slippers when out of bed  Intervention: Prevent Skin Injury  Recent Flowsheet Documentation  Taken 03/14/2023 1400 by Winona Legato, RN  Positioning for Skin: Supine/Back  Taken 03/14/2023 1200 by Winona Legato, RN  Positioning for Skin: (sitting on the bed) Other (Comment)  Taken 03/14/2023 1000 by Winona Legato, RN  Positioning for Skin:   Right   Sitting in Chair  Taken 03/14/2023 9604 by Winona Legato, RN  Positioning for Skin: Supine/Back  Goal: Optimal Comfort and Wellbeing  Outcome: Progressing  Goal: Readiness for Transition of Care  Outcome: Progressing  Goal: Rounds/Family Conference  Outcome: Progressing     Problem: Wound  Goal: Optimal Coping  Outcome: Progressing  Goal: Optimal Functional Ability  Outcome: Progressing  Goal: Absence of Infection Signs and Symptoms  Outcome: Progressing  Goal: Improved Oral Intake  Outcome: Progressing  Goal: Optimal Pain Control and Function  Outcome: Progressing  Goal: Skin Health and Integrity  Outcome: Progressing  Intervention: Optimize Skin Protection  Recent Flowsheet Documentation  Taken 03/14/2023 1400 by Winona Legato, RN  Pressure Reduction Techniques:   frequent weight shift encouraged   heels elevated off bed   pressure points protected  Taken 03/14/2023 1200 by Winona Legato, RN  Pressure Reduction Techniques:   frequent weight shift encouraged   pressure points protected  Taken 03/14/2023 1000 by Winona Legato, RN  Pressure Reduction Techniques:   frequent weight shift encouraged   pressure points protected  Pressure Reduction Devices: chair cushion utilized  Taken 03/14/2023 5409 by Winona Legato, RN  Pressure Reduction Techniques:   frequent weight shift encouraged   heels elevated off bed   pressure points protected  Goal: Optimal Wound Healing  Outcome: Progressing

## 2023-03-15 LAB — BASIC METABOLIC PANEL
ANION GAP: 8 mmol/L (ref 5–14)
ANION GAP: 6 mmol/L (ref 5–14)
BLOOD UREA NITROGEN: 51 mg/dL — ABNORMAL HIGH (ref 9–23)
BLOOD UREA NITROGEN: 45 mg/dL — ABNORMAL HIGH (ref 9–23)
BUN / CREAT RATIO: 33
BUN / CREAT RATIO: 31
CALCIUM: 9.8 mg/dL (ref 8.7–10.4)
CALCIUM: 9.8 mg/dL (ref 8.7–10.4)
CHLORIDE: 109 mmol/L — ABNORMAL HIGH (ref 98–107)
CHLORIDE: 109 mmol/L — ABNORMAL HIGH (ref 98–107)
CO2: 20.9 mmol/L (ref 20.0–31.0)
CO2: 21.9 mmol/L (ref 20.0–31.0)
CREATININE: 1.56 mg/dL — ABNORMAL HIGH
CREATININE: 1.43 mg/dL — ABNORMAL HIGH
EGFR CKD-EPI (2021) FEMALE: 45 mL/min/{1.73_m2} — ABNORMAL LOW (ref >=60)
EGFR CKD-EPI (2021) FEMALE: 50 mL/min/{1.73_m2} — ABNORMAL LOW (ref >=60)
GLUCOSE RANDOM: 242 mg/dL — ABNORMAL HIGH (ref 70–179)
GLUCOSE RANDOM: 254 mg/dL — ABNORMAL HIGH (ref 70–179)
POTASSIUM: 5.4 mmol/L — ABNORMAL HIGH (ref 3.4–4.8)
POTASSIUM: 5.4 mmol/L — ABNORMAL HIGH (ref 3.4–4.8)
SODIUM: 138 mmol/L (ref 135–145)
SODIUM: 137 mmol/L (ref 135–145)

## 2023-03-15 LAB — BLOOD GAS, VENOUS
BASE EXCESS VENOUS: -2.5 — ABNORMAL LOW (ref -2.0–2.0)
HCO3 VENOUS: 22 mmol/L (ref 22–27)
O2 SATURATION VENOUS: 86.9 % — ABNORMAL HIGH (ref 40.0–85.0)
PCO2 VENOUS: 38 mmHg — ABNORMAL LOW (ref 40–60)
PH VENOUS: 7.38 (ref 7.32–7.43)
PO2 VENOUS: 57 mmHg — ABNORMAL HIGH (ref 35–40)

## 2023-03-15 MED ORDER — SODIUM ZIRCONIUM CYCLOSILICATE 10 GRAM ORAL POWDER PACKET
PACK | Freq: Two times a day (BID) | ORAL | 0 refills | 3 days | Status: CP
Start: 2023-03-15 — End: 2023-03-18

## 2023-03-15 MED ADMIN — sodium zirconium cyclosilicate (LOKELMA) packet 10 g: 10 g | ORAL | @ 15:00:00 | Stop: 2023-03-15

## 2023-03-15 MED ADMIN — traZODone (DESYREL) tablet 200 mg: 200 mg | ORAL | @ 01:00:00

## 2023-03-15 MED ADMIN — lamoTRIgine (LaMICtal) tablet 150 mg: 150 mg | ORAL | @ 01:00:00

## 2023-03-15 MED ADMIN — insulin lispro (HumaLOG) injection 0-20 Units: 0-20 [IU] | SUBCUTANEOUS | @ 12:00:00 | Stop: 2023-03-15

## 2023-03-15 MED ADMIN — ferrous sulfate tablet 325 mg: 325 mg | ORAL | @ 12:00:00 | Stop: 2023-03-15

## 2023-03-15 MED ADMIN — insulin glargine (LANTUS) injection 100 Units: 100 [IU] | SUBCUTANEOUS | @ 01:00:00

## 2023-03-15 MED ADMIN — divalproex ER (DEPAKOTE ER) extended released 24 hr tablet 500 mg: 500 mg | ORAL | @ 01:00:00

## 2023-03-15 MED ADMIN — lamoTRIgine (LaMICtal) tablet 150 mg: 150 mg | ORAL | @ 12:00:00 | Stop: 2023-03-15

## 2023-03-15 MED ADMIN — ziprasidone (GEODON) capsule 80 mg: 80 mg | ORAL | @ 12:00:00 | Stop: 2023-03-15

## 2023-03-15 MED ADMIN — insulin glargine (LANTUS) injection 100 Units: 100 [IU] | SUBCUTANEOUS | @ 10:00:00 | Stop: 2023-03-15

## 2023-03-15 MED ADMIN — insulin lispro (HumaLOG) injection 16 Units: 16 [IU] | SUBCUTANEOUS | @ 12:00:00 | Stop: 2023-03-15

## 2023-03-15 MED ADMIN — pantoprazole (Protonix) EC tablet 40 mg: 40 mg | ORAL | @ 12:00:00 | Stop: 2023-03-15

## 2023-03-15 MED ADMIN — atenolol (TENORMIN) tablet 100 mg: 100 mg | ORAL | @ 12:00:00 | Stop: 2023-03-15

## 2023-03-15 MED ADMIN — norethindrone (AYGESTIN) tablet 5 mg: 5 mg | ORAL | @ 12:00:00 | Stop: 2023-03-15

## 2023-03-15 MED ADMIN — insulin lispro (HumaLOG) injection 0-20 Units: 0-20 [IU] | SUBCUTANEOUS | @ 02:00:00

## 2023-03-15 NOTE — Unmapped (Signed)
Patient is here for AKI. Patient is A&O x 4. No complaint of pain and no falls this shift. VSS, on tele for cardiac monitoring, and RA. Glucose checks completed as ordered and needed insulin given. Standard precautions maintained, bed in low position, and call bell within reach  Problem: Adult Inpatient Plan of Care  Goal: Plan of Care Review  Outcome: Ongoing - Unchanged  Goal: Patient-Specific Goal (Individualized)  Outcome: Ongoing - Unchanged  Goal: Absence of Hospital-Acquired Illness or Injury  Outcome: Ongoing - Unchanged  Intervention: Identify and Manage Fall Risk  Recent Flowsheet Documentation  Taken 03/14/2023 2000 by Melony Overly, RN  Safety Interventions:   low bed   lighting adjusted for tasks/safety  Intervention: Prevent Skin Injury  Recent Flowsheet Documentation  Taken 03/14/2023 2200 by Melony Overly, RN  Skin Protection: incontinence pads utilized  Taken 03/14/2023 2000 by Melony Overly, RN  Skin Protection: incontinence pads utilized  Goal: Optimal Comfort and Wellbeing  Outcome: Ongoing - Unchanged  Goal: Readiness for Transition of Care  Outcome: Ongoing - Unchanged  Goal: Rounds/Family Conference  Outcome: Ongoing - Unchanged     Problem: Wound  Goal: Optimal Coping  Outcome: Ongoing - Unchanged  Goal: Optimal Functional Ability  Outcome: Ongoing - Unchanged  Goal: Absence of Infection Signs and Symptoms  Outcome: Ongoing - Unchanged  Goal: Improved Oral Intake  Outcome: Ongoing - Unchanged  Goal: Optimal Pain Control and Function  Outcome: Ongoing - Unchanged  Goal: Skin Health and Integrity  Outcome: Ongoing - Unchanged  Intervention: Optimize Skin Protection  Recent Flowsheet Documentation  Taken 03/14/2023 2200 by Dimas Scheck, RN  Pressure Reduction Techniques: frequent weight shift encouraged  Pressure Reduction Devices:   positioning supports utilized   pressure-redistributing mattress utilized  Skin Protection: incontinence pads utilized  Taken 03/14/2023 2000 by Melony Overly, RN  Pressure Reduction Techniques: frequent weight shift encouraged  Pressure Reduction Devices: positioning supports utilized  Skin Protection: incontinence pads utilized  Goal: Optimal Wound Healing  Outcome: Ongoing - Unchanged

## 2023-03-15 NOTE — Unmapped (Signed)
Physician Discharge Summary    Admit date: 03/13/2023    Discharge date and time: 03/15/2023    Discharge to: Home    Discharge Service: Med Undesignated (MDX)    Discharge Attending Physician: Rica Koyanagi, MD    Discharge Diagnoses: Hyperkalemia    Hospital Course: The patient is a 32 year old Caucasian female admitted to the hospital for hyperkalemia and acute kidney injury after being asked to present to the hospital when she was found to have a potassium level of 5.7 on routine outpatient labs with an elevated creatinine of 2.7 mg/dL.  Creatinine on 01/29/2023 was 1.0, rising to 1.7 on 4/23.  She had undergone a cardiac cath on 4/17 and was started on spironolactone after the procedure.  She had been newly diagnosed with pulmonary hypertension.  Because of her elevated potassium and increased creatinine, request was made from the ER to admit her to the hospital for treatment of hyperkalemia and monitoring of renal function, cardiac monitoring and management of her otherwise complex past medical history that includes type 2 diabetes insulin-dependent, bipolar syndrome, [name], nonalcoholic fatty liver disease, essential hypertension, heart failure with preserved ejection fraction versus pulmonary hypertension.  She will be seen in outpatient Cardiology follow-up.  Bedside Nephrology consultation is not available at [facility] on the weekend but given the improvement in her creatinine, this was not felt to be essential during this hospitalization.    She was admitted to the hospital, placed on telemetry and had no dysrhythmias.  Aldactone was of course held as well as her losartan as it may have contributed to her elevated potassium.  She was given IV fluids and on the morning following admission her potassium had dropped to 5.0 but her creatinine remained elevated at 2.4.  She remained overnight on IV fluids and her creatinine came down to 1.4 but potassium was still elevated at 5.4.  She was given a dose of Lokelma and discharged home.  She is to take a dose of Lokelma later on the evening of discharge, another dose in the morning and an order for outpatient labs has been placed for her to return to [facility] for a lab check in the morning after discharge.  She is given a total of 5 doses in case she needs additional Lokelma.  Her outpatient physicians will be notified of these lab tests and this provider will also be on duty.  The patient is attended to quite closely by her mother who will ensure that any appropriate follow-up takes place.      The patient's other medical problems were stable and managed with appropriate adjustment of her medications as needed for this hospitalization, in particular her insulin regimen.  She did not show signs of overt metabolic derangement secondary to diabetes.    She will need consideration of resumption of losartan and reconsideration of spironolactone in the outpatient setting.      Condition at Discharge: stable  Discharge Medications:      Your Medication List        STOP taking these medications      spironolactone 25 MG tablet  Commonly known as: ALDACTONE     valsartan 160 MG tablet  Commonly known as: DIOVAN            START taking these medications      sodium zirconium cyclosilicate 10 gram Pwpk packet  Commonly known as: LOKELMA  Take 1 packet (10 g total) by mouth two (2) times a day for 5 doses.  Take pill or capsule if packet not available for 2 doses than as directed            CONTINUE taking these medications      ACCU-CHEK GUIDE TEST STRIPS Strp  Generic drug: blood sugar diagnostic  Check sugars before meals three times for insulin dependent type two diabetes. E11.65     acetaminophen 500 MG tablet  Commonly known as: TYLENOL  Take 2 tablets (1,000 mg total) by mouth every six (6) hours as needed for pain. Extra strength tylenol     albuterol 90 mcg/actuation inhaler  Commonly known as: PROVENTIL HFA;VENTOLIN HFA  Inhale 2 puffs every six (6) hours as needed.     alcohol swabs Padm  Commonly known as: ALCOHOL PADS  Apply 1 Swab topically Three (3) times a day before meals.     atenolol 100 MG tablet  Commonly known as: TENORMIN  TAKE 1 TABLET BY MOUTH IN THE MORNING.     BOSULIF 500 mg Tab  Generic drug: bosutinib  Take 1 tablet (500 mg total) by mouth once daily. Administer with food. Swallow tablet whole; do not cut, crush, break, or chew.     calcium carbonate-vitamin D3 600 mg-20 mcg (800 unit) Tab  Take 1 mg by mouth Two (2) times a day (at 8am and 12:00).     cetirizine 10 MG tablet  Commonly known as: ZYRTEC  TAKE 1 TABLET BY MOUTH IN THE MORNING.     clonazePAM 0.5 MG tablet  Commonly known as: KlonoPIN  Take 1 tablet (0.5 mg total) by mouth daily as needed. PRN     cyclobenzaprine 5 MG tablet  Commonly known as: FLEXERIL  Take 1 tablet (5 mg total) by mouth Three (3) times a day as needed (lower back pain).     DEXCOM G6 RECEIVER Misc  Generic drug: blood-glucose meter,continuous  Dispense DexCom G6 Receiver     DEXCOM G6 SENSOR Devi  Generic drug: blood-glucose sensor  USE 1 SENSOR EVERY 10 DAYS AS DIRECTED     DEXCOM G6 TRANSMITTER Devi  Generic drug: blood-glucose transmitter  Use 1 every 90 days     diclofenac sodium 1 % gel  Commonly known as: VOLTAREN  Apply 4 g topically four (4) times a day.     divalproex ER 500 MG extended released 24 hr tablet  Commonly known as: DEPAKOTE ER  TAKE 1 TABLET BY MOUTH AT BEDTIME     ferrous sulfate 325 (65 FE) MG tablet  Take 1 tablet (325 mg total) by mouth in the morning.     HumuLIN R U-500 (Conc) Kwikpen 500 unit/mL (3 mL) CONCENTRATED injection  Generic drug: insulin regular hum U-500 conc  Inject 150 Units under the skin Two (2) times a day (30 minutes before a meal).     hydrOXYzine 50 MG capsule  Commonly known as: VISTARIL  Take 2 capsules (100 mg total) by mouth nightly. And takes PRN     lamoTRIgine 150 MG tablet  Commonly known as: LaMICtal  Take 1 tablet (150 mg total) by mouth two (2) times a day. lancets Misc  Commonly known as: ACCU-CHEK SOFTCLIX LANCETS  Check sugar three times per day before meals for insulin dependent type two diabetes.  E11.65     LUPRON DEPOT IM  Inject into the muscle.     metFORMIN 1000 MG tablet  Commonly known as: GLUCOPHAGE  TAKE ONE TABLET BY MOUTH TWICE DAILY IN THE MORNING AND IN THE EVENING.  TAKE WITH MEALS     MOUNJARO 5 mg/0.5 mL Pnij  Generic drug: tirzepatide  Inject 5 mg under the skin every seven (7) days for 4 doses.     MOUNJARO 10 mg/0.5 mL Pnij  Generic drug: tirzepatide  10 mg subcutaneous weekly     norethindrone 5 mg tablet  Commonly known as: AYGESTIN  TAKE ONE TABLET EVERY MORNING     pantoprazole 40 MG tablet  Commonly known as: Protonix  TAKE 1 TABLET BY MOUTH IN THE MORNING.     pregabalin 75 MG capsule  Commonly known as: LYRICA  TAKE 1 CAPSULE BY MOUTH IN THE MORNING AND 2 CAPSULES BY MOUTH IN THE EVENING.     TECHLITE PEN NEEDLE 32 gauge x 1/4 (6 mm) Ndle  Generic drug: pen needle, diabetic  USE 3 TIMES DAILY AS DIRECTED     torsemide 40 mg Tab  Take 1 tablet (40 mg total) by mouth daily.     traZODone 100 MG tablet  Commonly known as: DESYREL  TAKE 2 TABLETS BY MOUTH AT BEDTIME     UNABLE TO FIND  Take by mouth daily. Med Name: vitamin D 1,000 mg daily.  Patient does not have medication yet, but plan on picking it up     ziprasidone 80 MG capsule  Commonly known as: GEODON  Take 1 capsule (80 mg total) by mouth in the morning and 1 capsule (80 mg total) in the evening. Take with meals.            ASK your doctor about these medications      MOUNJARO 7.5 mg/0.5 mL Pnij  Generic drug: tirzepatide  Inject 7.5 mg under the skin every seven (7) days for 4 doses.  Ask about: Should I take this medication?              Pending Test Results:       Discharge Instructions:   Activity Instructions       Activity as tolerated            Diet Instructions       Discharge diet (specify)      Discharge Nutrition Therapy: Consistent Carb    Consistent Carb Level: Consistent Carb 45/45/45 (3/3/3)    Avoid high potassium foods              Other Instructions       Discharge instructions      Contact Cardiology and Primary care for follow up.  Take another dose of Lokelma later today in evening after 6 PM and take a dose tomorrow morning, then go to get your blood checked for the potassium level and kidney function test tomorrow.  The medications Aldactone and losartan (Diovan) are being held until either your Primary Care doctor or Dr. Verdis Frederickson from Cardiology can decide how and when to resume them.    Discharge instructions to patient: Call your primary care doctor and make an appointment to see them:      Within 2 weeks from the time you are discharged from the hospital        Follow Up instructions and Outpatient Referrals     Discharge instructions      Basic Metabolic Panel      Is this a fasting order?: No    Release to patient: Immediate     BMP contains the following laboratory tests: NA, K, CL, CO2, BUN, CR,   GLUC, and CA.  Appointments which have been scheduled for you      Mar 25, 2023 11:20 AM  (Arrive by 11:05 AM)  RETURN WEIGHT MANAGEMENT with Tamala Fothergill, DO  [facility] MEDICAL WEIGHT PROGRAM [place] [facility] REGION) [address]  [facility] 1 through 4  [place] [address]  [phone]   Please check with your insurance company about prescription coverage for anti-obesity medications (AOMs) prior to your visit so that we can make the most informed decisions for your care.     Please bring your current insurance cards, including prescription coverage, to your appointment to update at registration/check-in, or scan into My [facility] Chart.         Mar 31, 2023 1:00 PM  (Arrive by 12:30 PM)  LAB ONLY [place] with ADULT ONC LAB  [facility] ADULT ONCOLOGY LAB DRAW STATION [place] [facility] REGION) [address]  [place] [address]  [phone]        Mar 31, 2023 2:00 PM  (Arrive by 1:30 PM)  RETURN ACTIVE [place] with Haig Prophet, [place]  [facility] HEMATOLOGY ONCOLOGY 2ND FLR CANCER HOSP [facility] REGION) [address]  [place] [address]  [phone]        Apr 06, 2023 3:00 PM  (Arrive by 2:45 PM)  RETURN VIDEO HCP MYCHART with Clovia Cuff  [facility] DIABETES AND ENDOCRINOLOGY [place] [facility] REGION) [address]  [facility] 1 through 4  [place] [address]  [phone]   Please sign into My [place] Chart at least 15 minutes before your appointment to complete the eCheck-In process. You must complete eCheck-In before you can start your video visit. We also recommend testing your audio and video connection to troubleshoot any issues before your visit begins. Click ???Join Video Visit??? to complete these checks. Once you have completed eCheck-In and tested your audio and video, click ???Join Call??? to connect to your visit.     For your video visit, you will need a computer with a working camera, speaker and microphone, a smartphone, or a tablet with internet access.    My [facility] Chart enables you to manage your health, send non-urgent messages to your provider, view your test results, schedule and manage appointments, and request prescription refills securely and conveniently from your computer or mobile device.    You can go to [email or web address] to sign in to your My [place] Chart account with your username and password. If you have forgotten your username or password, please choose the ???Forgot Username???? and/or ???Forgot Password???? links to gain access. You also can access your My [place] Chart account with the free MyChart mobile app for Android or iPhone.    If you need assistance accessing your My [facility] Chart account or for assistance in reaching your provider's office to reschedule or cancel your appointment, please call [facility] [phone].         Apr 09, 2023 8:40 AM  (Arrive by 8:25 AM)  RETURN CARDIOLOGY with Autumn Messing, MD  [facility] CARDIOLOGY [address] [facility] REGION) [address]  [address]nd Floor  [place] [address]  [phone]        Apr 21, 2023 1:00 PM  NEW OPTOMETRY with Ali Lowe Atkinson, OD  [place] OPHTHALMOLOGY [address] [place] [facility] REGION) [address]  [address]  [place] [address]  [phone]  Apr 23, 2023 1:00 PM  (Arrive by 12:45 PM)  NURSE VISIT with ONCINF HMOB LABS  [facility] ONCOLOGY [facility] REGION) [address]  1st Floor  [place] [address]  [phone]        Jul 17, 2023 3:10 PM  (Arrive by 2:55 PM)  RETURN  DIABETES with Lindalou Hose Dulcy Fanny, MD  [facility] DIABETES AND ENDOCRINOLOGY [place] [facility] REGION) [address]  [facility] 1 through 4  [place] [address]  [phone]                I spent greater than 30 minutes in the discharge of this patient.    Tawni Levy MD

## 2023-03-15 NOTE — Unmapped (Signed)
Patient discharging home for self care via mom    Problem: Adult Inpatient Plan of Care  Goal: Plan of Care Review  Outcome: Progressing  Goal: Patient-Specific Goal (Individualized)  Outcome: Progressing  Goal: Absence of Hospital-Acquired Illness or Injury  Outcome: Progressing  Intervention: Identify and Manage Fall Risk  Recent Flowsheet Documentation  Taken 03/15/2023 0800 by Jaquelyn Bitter, RN  Safety Interventions:   low bed   family at bedside   fall reduction program maintained   room near unit station  Intervention: Prevent Skin Injury  Recent Flowsheet Documentation  Taken 03/15/2023 0800 by Jaquelyn Bitter, RN  Positioning for Skin: Supine/Back  Goal: Optimal Comfort and Wellbeing  Outcome: Progressing  Goal: Readiness for Transition of Care  Outcome: Progressing  Goal: Rounds/Family Conference  Outcome: Progressing     Problem: Wound  Goal: Optimal Coping  Outcome: Progressing  Goal: Optimal Functional Ability  Outcome: Progressing  Goal: Absence of Infection Signs and Symptoms  Outcome: Progressing  Goal: Improved Oral Intake  Outcome: Progressing  Goal: Optimal Pain Control and Function  Outcome: Progressing  Goal: Skin Health and Integrity  Outcome: Progressing  Intervention: Optimize Skin Protection  Recent Flowsheet Documentation  Taken 03/15/2023 0800 by Jaquelyn Bitter, RN  Pressure Reduction Techniques: frequent weight shift encouraged  Head of Bed (HOB) Positioning: HOB elevated  Pressure Reduction Devices: positioning supports utilized  Goal: Optimal Wound Healing  Outcome: Progressing

## 2023-03-17 ENCOUNTER — Other Ambulatory Visit: Admit: 2023-03-17 | Discharge: 2023-03-18 | Payer: MEDICAID

## 2023-03-17 LAB — BASIC METABOLIC PANEL
ANION GAP: 11 mmol/L (ref 5–14)
BLOOD UREA NITROGEN: 29 mg/dL — ABNORMAL HIGH (ref 9–23)
BUN / CREAT RATIO: 20
CALCIUM: 9.3 mg/dL (ref 8.7–10.4)
CHLORIDE: 105 mmol/L (ref 98–107)
CO2: 20.6 mmol/L (ref 20.0–31.0)
CREATININE: 1.46 mg/dL — ABNORMAL HIGH
EGFR CKD-EPI (2021) FEMALE: 49 mL/min/{1.73_m2} — ABNORMAL LOW (ref >=60)
GLUCOSE RANDOM: 249 mg/dL — ABNORMAL HIGH (ref 70–179)
POTASSIUM: 4.3 mmol/L (ref 3.4–4.8)
SODIUM: 137 mmol/L (ref 135–145)

## 2023-03-17 NOTE — Unmapped (Signed)
Please see results of labs drawn by ER doctor.  I can defer diuretic management to cardiology to avoid risk of over medication and ADE. n

## 2023-03-25 MED ORDER — PREGABALIN 75 MG CAPSULE
ORAL_CAPSULE | 0 refills | 0 days | Status: CP
Start: 2023-03-25 — End: ?

## 2023-03-25 MED ORDER — NORETHINDRONE ACETATE 5 MG TABLET
ORAL_TABLET | 11 refills | 0 days
Start: 2023-03-25 — End: ?

## 2023-03-25 NOTE — Unmapped (Signed)
Pt is requesting refill    Most recent clinic visit: 01/29/2023  Next clinic visit:  03/31/2023

## 2023-03-26 ENCOUNTER — Ambulatory Visit: Admit: 2023-03-26 | Discharge: 2023-03-27 | Payer: MEDICAID

## 2023-03-26 LAB — BASIC METABOLIC PANEL
ANION GAP: 11 mmol/L (ref 5–14)
BLOOD UREA NITROGEN: 14 mg/dL (ref 9–23)
BUN / CREAT RATIO: 11
CALCIUM: 10 mg/dL (ref 8.7–10.4)
CHLORIDE: 107 mmol/L (ref 98–107)
CO2: 23 mmol/L (ref 20.0–31.0)
CREATININE: 1.26 mg/dL — ABNORMAL HIGH
EGFR CKD-EPI (2021) FEMALE: 58 mL/min/{1.73_m2} — ABNORMAL LOW (ref >=60)
GLUCOSE RANDOM: 122 mg/dL (ref 70–179)
POTASSIUM: 4.3 mmol/L (ref 3.4–4.8)
SODIUM: 141 mmol/L (ref 135–145)

## 2023-03-26 MED ORDER — DAPAGLIFLOZIN PROPANEDIOL 10 MG TABLET
ORAL_TABLET | Freq: Every morning | ORAL | 3 refills | 90 days | Status: CP
Start: 2023-03-26 — End: ?

## 2023-03-26 MED ORDER — CARVEDILOL 6.25 MG TABLET
ORAL_TABLET | Freq: Two times a day (BID) | ORAL | 6 refills | 30 days | Status: CP
Start: 2023-03-26 — End: 2024-03-25

## 2023-03-26 MED ORDER — TORSEMIDE 40 MG TABLET
ORAL_TABLET | Freq: Every day | ORAL | 0 refills | 30 days | Status: CP
Start: 2023-03-26 — End: 2023-04-25

## 2023-03-26 NOTE — Unmapped (Signed)
 [place] [facility] Terrence Dupont  [address] - Phone: [phone]  [place], [place] [address] - Fax: [phone]   Date of Service: 03/26/2023    Assessment/Plan:   1. Chronic heart failure with preserved ejection fraction (CMS-HCC)  Doing well overall since her recent admission. We will discontinue atenolol and switch to carvedilol 6.25 mg BID. No ACEi/ARB/ARNI/MRA due to intolerances/SEs in the past. She stopped SGLT2i in the past, but has not had any recent UTIs -- will send Rx for dapagliflozin to see if we can get her back on this. Continue on torsemide 80 mg daily. BMP today.    Return to clinic: scheduled for follow-up 5/23 -- will change to mid-June.    I personally spent 35 minutes face-to-face and non-face-to-face in the care of this patient, which includes all pre, intra, and post visit time on the date of service.    Documentation assistance was provided by Mirian Capuchin, Scribe on Mar 26, 2023 3:53 PM  for Gwynne Edinger, MD.     I have reviewed the documentation provided by the scribe and confirm that it accurately reflects the service I personally performed and the decisions made by me.  Signature: KAF  Date: 03/31/23   Time: 3:53 PM        Caryn Bee A. Verdis Frederickson, MD, [facility]  Assistant Professor of Medicine  Division of Cardiology  [place] [facility]      Subjective:   ZHY:QMVHQ, Mauricio Po, MD  Patient ID: Jenna Mosley is an 32 y.o. female patient with HTN, [name], [name], AKI, BP1D, and s/p RHC on 03/04/23, referred by None Per Patient Pcp, presenting for follow-up of HFpEF.    Per chart review, RHC was performed which showed elevated filling pressures with normal cardiac output. She was started on spironolactone but did not tolerate this due to hyperkalemia and AKI, which required a brief admission for hyperkalemia management.    Interval History:  Today Jenna Mosley presents to the clinic accompanied and ambulating without external support. She has been experiencing some shortness of breath following eating some high sodium food recently, though her weight is currently down trending with diuretic use. She has remained adherent to her current medication plan, despite discontinuing her spironolactone following elevated potassium levels seen on BMP panels. Denies CP, SOB, DOE, presyncope/syncope, edema, orthostasis, or orthopnea.    Objective:   Physical Exam:  BP 130/62 (BP Site: L Arm, BP Position: Sitting, BP Cuff Size: Medium)  - Pulse 66  - Wt (!) 170.9 kg (376 lb 11.2 oz)  - SpO2 96%  - BMI 51.08 kg/m??   GEN: adult female patient in NAD  HEENT: NCAT, sclerae anicteric, OP clear  NECK: unable to visualize due to body habitus  CARD: RRR   S1/S2 audible   no murmurs, rubs, or gallops  RESP: CTAB, no wheezes, crackles, or ronchi, normal work of breathing  ABDO: soft, NT/ND  EXTREM: WWP, PPP, without edema  NEURO: AAO, CN II-XII grossly normal, moving all 4 extremities  SKIN: warm and dry    Notable results:  Labs:   Lab Results   Component Value Date    WBC 8.0 03/31/2023    HGB 12.1 03/31/2023    HCT 35.9 03/31/2023    PLT 257 03/31/2023     Lab Results   Component Value Date    NA 142 03/31/2023    K 4.4 03/31/2023    CREATININE 1.09 (H) 03/31/2023      Lab  Results   Component Value Date    CHOL 166 08/26/2022    LDL 81 08/26/2022    HDL 30 (L) 08/26/2022    TRIG 274 (H) 08/26/2022     Lab Results   Component Value Date    A1C 10.6 (H) 03/10/2023      Lab Results   Component Value Date    TROPONINI <0.300 10/24/2019    TROPONINI <0.034 08/04/2019    TROPONINI <0.300 08/02/2019        Imaging/Other:  Electrocardiogram:  From 03/13/23 showed: Normal sinus rhythm.     Cardiac catheterization:  From 03/04/23 showed: Elevated right- and left-sided filling pressures. Moderate pulmonary hypertension. Normal cardiac output and index. No significant transhepatic pressure gradient.    The ASCVD Risk score (Arnett DK, et al., 2019) failed to calculate.

## 2023-03-26 NOTE — Unmapped (Signed)
I don't think I've seen this patient and am not her PCP

## 2023-03-26 NOTE — Unmapped (Signed)
Stop atenolol  Start carvedilol (Coreg) 6.25 mg twice daily  Continue torsemide 80 mg once daily -- OK to take an occasional second dose if you are accumulating fluid  Prescription sent for dapagliflozin Marcelline Deist) -- will likely require approval  Labs today

## 2023-03-26 NOTE — Unmapped (Signed)
Patient here for follow up after hospitalization and discharge.  Continues to have SOB.  No CP.  Would like to discuss meds to make sure taking correctly after discharge.

## 2023-03-27 NOTE — Unmapped (Signed)
[facility] Refill Coordination Note    Specialty Medication(s) to be Shipped:   Hematology/Oncology: Bosulif    Other medication(s) to be shipped: No additional medications requested for fill at this time     Jenna Mosley, DOB: May 29, 1991  Phone: [phone] (home) [phone] (work)      All above HIPAA information was verified with patient.     Was a Nurse, learning disability used for this call? No    Completed refill call assessment today to schedule patient's medication shipment from the [facility] [phone]).  All relevant notes have been reviewed.     Specialty medication(s) and dose(s) confirmed: Regimen is correct and unchanged.   Changes to medications: Jenna Mosley reports no changes at this time.  Changes to insurance: No  New side effects reported not previously addressed with a pharmacist or physician: None reported  Questions for the pharmacist: No    Confirmed patient received a Conservation officer, historic buildings and a Surveyor, mining with first shipment. The patient will receive a drug information handout for each medication shipped and additional FDA Medication Guides as required.       DISEASE/MEDICATION-SPECIFIC INFORMATION        N/A    SPECIALTY MEDICATION ADHERENCE     Medication Adherence    Patient reported X missed doses in the last month: 0  Specialty Medication: Bosulif 500mg   Patient is on additional specialty medications: No  Patient is on more than two specialty medications: No  Any gaps in refill history greater than 2 weeks in the last 3 months: no  Demonstrates understanding of importance of adherence: yes  Informant: patient  Reliability of informant: reliable  Provider-estimated medication adherence level: good  Patient is at risk for Non-Adherence: No  Reasons for non-adherence: no problems identified              Were doses missed due to medication being on hold? No    BOSULIF 500 mg Tab (bosutinib)  : 13 days of medicine on hand       REFERRAL TO PHARMACIST Referral to the pharmacist: Not needed      [facility]     Shipping address confirmed in Epic.       Delivery Scheduled: Yes, Expected medication delivery date: 04/01/23.     Medication will be delivered via Same Day Courier to the prescription address in Epic WAM.    Jenna Mosley' W Jenna Mosley Shared [facility] Specialty Technician

## 2023-03-28 DIAGNOSIS — C9210 Chronic myeloid leukemia, BCR/ABL-positive, not having achieved remission: Principal | ICD-10-CM
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
## 2023-03-31 ENCOUNTER — Other Ambulatory Visit: Admit: 2023-03-31 | Discharge: 2023-03-31 | Payer: MEDICAID

## 2023-03-31 ENCOUNTER — Ambulatory Visit
Admit: 2023-03-31 | Discharge: 2023-03-31 | Payer: MEDICAID | Attending: Student in an Organized Health Care Education/Training Program | Primary: Student in an Organized Health Care Education/Training Program

## 2023-03-31 DIAGNOSIS — C9210 Chronic myeloid leukemia, BCR/ABL-positive, not having achieved remission: Principal | ICD-10-CM

## 2023-03-31 LAB — CBC W/ AUTO DIFF
BASOPHILS ABSOLUTE COUNT: 0.1 10*9/L (ref 0.0–0.1)
BASOPHILS RELATIVE PERCENT: 1.5 %
EOSINOPHILS ABSOLUTE COUNT: 0.2 10*9/L (ref 0.0–0.5)
EOSINOPHILS RELATIVE PERCENT: 2.1 %
HEMATOCRIT: 35.9 % (ref 34.0–44.0)
HEMOGLOBIN: 12.1 g/dL (ref 11.3–14.9)
LYMPHOCYTES ABSOLUTE COUNT: 2.4 10*9/L (ref 1.1–3.6)
LYMPHOCYTES RELATIVE PERCENT: 29.6 %
MEAN CORPUSCULAR HEMOGLOBIN CONC: 33.6 g/dL (ref 32.0–36.0)
MEAN CORPUSCULAR HEMOGLOBIN: 28.4 pg (ref 25.9–32.4)
MEAN CORPUSCULAR VOLUME: 84.6 fL (ref 77.6–95.7)
MEAN PLATELET VOLUME: 8.1 fL (ref 6.8–10.7)
MONOCYTES ABSOLUTE COUNT: 0.5 10*9/L (ref 0.3–0.8)
MONOCYTES RELATIVE PERCENT: 6.3 %
NEUTROPHILS ABSOLUTE COUNT: 4.8 10*9/L (ref 1.8–7.8)
NEUTROPHILS RELATIVE PERCENT: 60.5 %
PLATELET COUNT: 257 10*9/L (ref 150–450)
RED BLOOD CELL COUNT: 4.25 10*12/L (ref 3.95–5.13)
RED CELL DISTRIBUTION WIDTH: 14.8 % (ref 12.2–15.2)
WBC ADJUSTED: 8 10*9/L (ref 3.6–11.2)

## 2023-03-31 LAB — COMPREHENSIVE METABOLIC PANEL
ALBUMIN: 3.7 g/dL (ref 3.4–5.0)
ALKALINE PHOSPHATASE: 156 U/L — ABNORMAL HIGH (ref 46–116)
ALT (SGPT): 11 U/L (ref 10–49)
ANION GAP: 9 mmol/L (ref 5–14)
AST (SGOT): 19 U/L (ref <=34)
BILIRUBIN TOTAL: 0.3 mg/dL (ref 0.3–1.2)
BLOOD UREA NITROGEN: 13 mg/dL (ref 9–23)
BUN / CREAT RATIO: 12
CALCIUM: 9.3 mg/dL (ref 8.7–10.4)
CHLORIDE: 108 mmol/L — ABNORMAL HIGH (ref 98–107)
CO2: 25 mmol/L (ref 20.0–31.0)
CREATININE: 1.09 mg/dL — ABNORMAL HIGH
EGFR CKD-EPI (2021) FEMALE: 69 mL/min/{1.73_m2} (ref >=60)
GLUCOSE RANDOM: 202 mg/dL — ABNORMAL HIGH (ref 70–179)
POTASSIUM: 4.4 mmol/L (ref 3.4–4.8)
PROTEIN TOTAL: 7.7 g/dL (ref 5.7–8.2)
SODIUM: 142 mmol/L (ref 135–145)

## 2023-03-31 NOTE — Unmapped (Addendum)
It was great to see you today!     Keep taking your Bosutinib 500 mg daily.    I will see you back in 3 months!     Reach     If you have questions or concerns, you may call the Nurse call line at [phone].    Lab on 03/31/2023   Component Date Value Ref Range Status    Sodium 03/31/2023 142  135 - 145 mmol/L Final    Potassium 03/31/2023 4.4  3.4 - 4.8 mmol/L Final    Chloride 03/31/2023 108 (H)  98 - 107 mmol/L Final    CO2 03/31/2023 25.0  20.0 - 31.0 mmol/L Final    Anion Gap 03/31/2023 9  5 - 14 mmol/L Final    BUN 03/31/2023 13  9 - 23 mg/dL Final    Creatinine 86/57/8469 1.09 (H)  0.55 - 1.02 mg/dL Final    BUN/Creatinine Ratio 03/31/2023 12   Final    eGFR CKD-EPI (2021) Female 03/31/2023 69  >=60 mL/min/1.55m2 Final    eGFR calculated with CKD-EPI 2021 equation in accordance with [employer] and [employer] of Nephrology Task Force recommendations.    Glucose 03/31/2023 202 (H)  70 - 179 mg/dL Final    Calcium 62/95/2841 9.3  8.7 - 10.4 mg/dL Final    Albumin 32/44/0102 3.7  3.4 - 5.0 g/dL Final    Total Protein 03/31/2023 7.7  5.7 - 8.2 g/dL Final    Total Bilirubin 03/31/2023 0.3  0.3 - 1.2 mg/dL Final    AST 72/53/6644 19  <=34 U/L Final    ALT 03/31/2023 11  10 - 49 U/L Final    Alkaline Phosphatase 03/31/2023 156 (H)  46 - 116 U/L Final    WBC 03/31/2023 8.0  3.6 - 11.2 10*9/L Final    RBC 03/31/2023 4.25  3.95 - 5.13 10*12/L Final    HGB 03/31/2023 12.1  11.3 - 14.9 g/dL Final    HCT 03/47/4259 35.9  34.0 - 44.0 % Final    MCV 03/31/2023 84.6  77.6 - 95.7 fL Final    MCH 03/31/2023 28.4  25.9 - 32.4 pg Final    MCHC 03/31/2023 33.6  32.0 - 36.0 g/dL Final    RDW 56/38/7564 14.8  12.2 - 15.2 % Final    MPV 03/31/2023 8.1  6.8 - 10.7 fL Final    Platelet 03/31/2023 257  150 - 450 10*9/L Final    Neutrophils % 03/31/2023 60.5  % Final    Lymphocytes % 03/31/2023 29.6  % Final    Monocytes % 03/31/2023 6.3  % Final    Eosinophils % 03/31/2023 2.1  % Final    Basophils % 03/31/2023 1.5  % Final    Absolute Neutrophils 03/31/2023 4.8  1.8 - 7.8 10*9/L Final    Absolute Lymphocytes 03/31/2023 2.4  1.1 - 3.6 10*9/L Final    Absolute Monocytes 03/31/2023 0.5  0.3 - 0.8 10*9/L Final    Absolute Eosinophils 03/31/2023 0.2  0.0 - 0.5 10*9/L Final    Absolute Basophils 03/31/2023 0.1  0.0 - 0.1 10*9/L Final

## 2023-04-01 MED FILL — BOSULIF 500 MG TABLET: 30 days supply | Qty: 30 | Fill #2
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
## 2023-04-06 ENCOUNTER — Telehealth: Admit: 2023-04-06 | Discharge: 2023-04-07 | Payer: MEDICAID

## 2023-04-09 MED ORDER — SPIRONOLACTONE 25 MG TABLET
ORAL_TABLET | 0 refills | 0 days
Start: 2023-04-09 — End: ?

## 2023-04-10 MED ORDER — TORSEMIDE 20 MG TABLET
ORAL_TABLET | 6 refills | 0 days | Status: CP
Start: 2023-04-10 — End: ?

## 2023-04-13 MED ORDER — SPIRONOLACTONE 25 MG TABLET
ORAL_TABLET | 0 refills | 0 days
Start: 2023-04-13 — End: ?

## 2023-04-15 MED ORDER — NORETHINDRONE ACETATE 5 MG TABLET
ORAL_TABLET | 11 refills | 0 days | Status: CP
Start: 2023-04-15 — End: ?

## 2023-04-15 NOTE — Unmapped (Signed)
Pt is requesting refill    Most recent clinic visit: 03/31/2023  Next clinic visit:  06/30/2023

## 2023-04-16 NOTE — Unmapped (Signed)
Called pt set up an appt to have Zio patch applied per Dr. Verdis Frederickson.

## 2023-04-17 ENCOUNTER — Ambulatory Visit: Admit: 2023-04-17 | Discharge: 2023-04-18 | Payer: MEDICAID

## 2023-04-17 ENCOUNTER — Institutional Professional Consult (permissible substitution): Admit: 2023-04-17 | Discharge: 2023-04-18 | Payer: MEDICAID

## 2023-04-17 MED ORDER — ALBUTEROL SULFATE HFA 90 MCG/ACTUATION AEROSOL INHALER
Freq: Four times a day (QID) | RESPIRATORY_TRACT | 0 refills | 0 days | Status: CP | PRN
Start: 2023-04-17 — End: 2024-04-16

## 2023-04-17 NOTE — Unmapped (Signed)
Addended by: Beatrice Lecher on: 04/17/2023 01:38 PM     Modules accepted: Orders

## 2023-04-17 NOTE — Unmapped (Signed)
Pt has a sinus infection and is coughing and says she is sob.  Pulse is 92 and o2 sat  is 97% on RA.  Zio instructions explained, device applied for 7 days.  Pt verbalizes understanding of device.  Registered in [place] [id number].

## 2023-04-18 ENCOUNTER — Emergency Department: Admit: 2023-04-18 | Discharge: 2023-04-18 | Disposition: A | Discharge status: Home / Self Care | Payer: MEDICAID

## 2023-04-18 ENCOUNTER — Ambulatory Visit: Admit: 2023-04-18 | Discharge: 2023-04-18 | Disposition: A | Discharge status: Home / Self Care | Payer: MEDICAID

## 2023-04-18 DIAGNOSIS — J40 Bronchitis, not specified as acute or chronic: Principal | ICD-10-CM

## 2023-04-18 DIAGNOSIS — N390 Urinary tract infection, site not specified: Principal | ICD-10-CM

## 2023-04-18 LAB — CBC W/ AUTO DIFF
BASOPHILS ABSOLUTE COUNT: 0.1 10*9/L (ref 0.0–0.1)
BASOPHILS RELATIVE PERCENT: 1.2 %
EOSINOPHILS ABSOLUTE COUNT: 0.2 10*9/L (ref 0.0–0.5)
EOSINOPHILS RELATIVE PERCENT: 2.2 %
HEMATOCRIT: 37.2 % (ref 34.0–44.0)
HEMOGLOBIN: 12.5 g/dL (ref 11.3–14.9)
LYMPHOCYTES ABSOLUTE COUNT: 2.5 10*9/L (ref 1.1–3.6)
LYMPHOCYTES RELATIVE PERCENT: 22.7 %
MEAN CORPUSCULAR HEMOGLOBIN CONC: 33.6 g/dL (ref 32.0–36.0)
MEAN CORPUSCULAR HEMOGLOBIN: 28 pg (ref 25.9–32.4)
MEAN CORPUSCULAR VOLUME: 83.2 fL (ref 77.6–95.7)
MEAN PLATELET VOLUME: 8.8 fL (ref 6.8–10.7)
MONOCYTES ABSOLUTE COUNT: 1.5 10*9/L — ABNORMAL HIGH (ref 0.3–0.8)
MONOCYTES RELATIVE PERCENT: 13.4 %
NEUTROPHILS ABSOLUTE COUNT: 6.8 10*9/L (ref 1.8–7.8)
NEUTROPHILS RELATIVE PERCENT: 60.5 %
NUCLEATED RED BLOOD CELLS: 0 /100{WBCs} (ref <=4)
PLATELET COUNT: 286 10*9/L (ref 150–450)
RED BLOOD CELL COUNT: 4.46 10*12/L (ref 3.95–5.13)
RED CELL DISTRIBUTION WIDTH: 14.9 % (ref 12.2–15.2)
WBC ADJUSTED: 11.2 10*9/L (ref 3.6–11.2)

## 2023-04-18 LAB — COMPREHENSIVE METABOLIC PANEL
ALBUMIN: 3.5 g/dL (ref 3.4–5.0)
ALKALINE PHOSPHATASE: 162 U/L — ABNORMAL HIGH (ref 46–116)
ALT (SGPT): 8 U/L — ABNORMAL LOW (ref 10–49)
ANION GAP: 11 mmol/L (ref 5–14)
AST (SGOT): 18 U/L (ref <=34)
BILIRUBIN TOTAL: 0.4 mg/dL (ref 0.3–1.2)
BLOOD UREA NITROGEN: 23 mg/dL (ref 9–23)
BUN / CREAT RATIO: 19
CALCIUM: 9.8 mg/dL (ref 8.7–10.4)
CHLORIDE: 100 mmol/L (ref 98–107)
CO2: 23.7 mmol/L (ref 20.0–31.0)
CREATININE: 1.21 mg/dL — ABNORMAL HIGH
EGFR CKD-EPI (2021) FEMALE: 61 mL/min/{1.73_m2} (ref >=60)
GLUCOSE RANDOM: 273 mg/dL — ABNORMAL HIGH (ref 70–179)
POTASSIUM: 4.1 mmol/L (ref 3.4–4.8)
PROTEIN TOTAL: 7.7 g/dL (ref 5.7–8.2)
SODIUM: 135 mmol/L (ref 135–145)

## 2023-04-18 LAB — URINALYSIS WITH MICROSCOPY
BILIRUBIN UA: NEGATIVE
GLUCOSE UA: 150 — AB
KETONES UA: NEGATIVE
NITRITE UA: NEGATIVE
PH UA: 5.5 (ref 5.0–9.0)
PROTEIN UA: 70 — AB
RBC UA: 3 /HPF (ref <=4)
SPECIFIC GRAVITY UA: 1.015 (ref 1.003–1.030)
SQUAMOUS EPITHELIAL: 1 /HPF (ref 0–5)
UROBILINOGEN UA: <2
WBC UA: 6 /HPF — ABNORMAL HIGH (ref 0–5)

## 2023-04-18 LAB — LACTATE SEPSIS, VENOUS: LACTATE BLOOD VENOUS: 3.2 mmol/L (ref 0.5–1.8)

## 2023-04-18 LAB — B-TYPE NATRIURETIC PEPTIDE: B-TYPE NATRIURETIC PEPTIDE: 7.42 pg/mL (ref <=100)

## 2023-04-18 LAB — HIGH SENSITIVITY TROPONIN I - SINGLE: HIGH SENSITIVITY TROPONIN I: 8 ng/L (ref <=34)

## 2023-04-18 MED ORDER — FLUTICASONE PROPIONATE 50 MCG/ACTUATION NASAL SPRAY,SUSPENSION
Freq: Every day | NASAL | 0 refills | 60 days | Status: CP
Start: 2023-04-18 — End: 2024-04-17

## 2023-04-18 MED ORDER — BENZONATATE 100 MG CAPSULE
ORAL_CAPSULE | Freq: Three times a day (TID) | ORAL | 0 refills | 7 days | Status: CP | PRN
Start: 2023-04-18 — End: 2023-04-25

## 2023-04-18 MED ORDER — CODEINE 10 MG-GUAIFENESIN 100 MG/5 ML ORAL LIQUID
Freq: Three times a day (TID) | ORAL | 0 refills | 5 days | Status: CP | PRN
Start: 2023-04-18 — End: 2023-04-23

## 2023-04-18 MED ORDER — NITROFURANTOIN MONOHYDRATE/MACROCRYSTALS 100 MG CAPSULE
ORAL_CAPSULE | Freq: Two times a day (BID) | ORAL | 0 refills | 7 days | Status: CP
Start: 2023-04-18 — End: 2023-04-25

## 2023-04-18 MED ADMIN — ketorolac (TORADOL) injection 30 mg: 30 mg | INTRAVENOUS | @ 15:00:00 | Stop: 2023-04-18

## 2023-04-18 MED ADMIN — sodium chloride 0.9% (NS) bolus 500 mL: 500 mL | INTRAVENOUS | @ 14:00:00 | Stop: 2023-04-18
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
## 2023-04-18 NOTE — Unmapped (Signed)
Pt reports sore throat and head cold along with fever (tmax 101), tightness to chest,ha, productive coughing and SOB for 5-6 days. States it is just getting worse. H/o leukemia

## 2023-04-20 DIAGNOSIS — Z794 Long term (current) use of insulin: Principal | ICD-10-CM

## 2023-04-20 DIAGNOSIS — E1140 Type 2 diabetes mellitus with diabetic neuropathy, unspecified: Principal | ICD-10-CM

## 2023-04-21 ENCOUNTER — Ambulatory Visit: Admit: 2023-04-21 | Discharge: 2023-04-22 | Payer: MEDICAID

## 2023-04-21 NOTE — Unmapped (Signed)
I called [facility].  PA not needed for Farixga.  Rx has been ordered and ready for pick up tomorrow.

## 2023-04-21 NOTE — Unmapped (Signed)
1) Regular astigmatism OD/OS, excellent BCVA  -- patient apprised of findings  -- new spec Rx given  -- keep appointments with Dr. Beckey Downing, RTC with Dr. Sheppard Evens prn for refractions

## 2023-04-23 ENCOUNTER — Institutional Professional Consult (permissible substitution): Admit: 2023-04-23 | Discharge: 2023-04-23 | Payer: MEDICAID

## 2023-04-23 DIAGNOSIS — C9210 Chronic myeloid leukemia, BCR/ABL-positive, not having achieved remission: Principal | ICD-10-CM

## 2023-04-23 MED ORDER — BOSULIF 500 MG TABLET
ORAL_TABLET | 2 refills | 0 days
Start: 2023-04-23 — End: ?

## 2023-04-23 MED ADMIN — leuprolide (LUPRON) injection 11.25 mg: 11.25 mg | INTRAMUSCULAR | @ 17:00:00 | Stop: 2023-04-23

## 2023-04-23 NOTE — Unmapped (Signed)
Patient arrived to the clinic for lupron injection. Patient was administered lupron IM injection per regimen order without complications while at clinic. Area covered with gauze bandaid. Patient discharged home to self care.

## 2023-04-24 MED ORDER — PANTOPRAZOLE 40 MG TABLET,DELAYED RELEASE
ORAL_TABLET | 3 refills | 0 days
Start: 2023-04-24 — End: ?

## 2023-04-24 MED FILL — DEXCOM G6 TRANSMITTER DEVICE: 90 days supply | Qty: 1 | Fill #0

## 2023-04-24 MED FILL — DEXCOM G6 SENSOR DEVICE: 30 days supply | Qty: 3 | Fill #0

## 2023-04-27 MED ORDER — PANTOPRAZOLE 40 MG TABLET,DELAYED RELEASE
ORAL_TABLET | 3 refills | 0 days | Status: CP
Start: 2023-04-27 — End: ?

## 2023-04-27 MED ORDER — BOSUTINIB 500 MG TABLET
ORAL_TABLET | 2 refills | 0 days | Status: CP
Start: 2023-04-27 — End: ?
  Filled 2023-05-08: qty 30, 30d supply, fill #0

## 2023-04-28 NOTE — Unmapped (Signed)
Blood Culture  Order: [id number]   Status: Final result      Blood Culture  Order: [id number]   Status: Final result      Final results reviewed. Both blood cultures are negative. No further action needed.

## 2023-05-06 ENCOUNTER — Ambulatory Visit
Admit: 2023-05-06 | Discharge: 2023-05-07 | Payer: MEDICAID | Attending: Geriatric Medicine | Primary: Geriatric Medicine

## 2023-05-06 LAB — URINALYSIS WITH MICROSCOPY WITH CULTURE REFLEX PERFORMABLE
BACTERIA: NONE SEEN /HPF
BILIRUBIN UA: NEGATIVE
BLOOD UA: NEGATIVE
GLUCOSE UA: >1000 — AB
KETONES UA: NEGATIVE
LEUKOCYTE ESTERASE UA: NEGATIVE
NITRITE UA: NEGATIVE
PH UA: 5.5 (ref 5.0–9.0)
PROTEIN UA: NEGATIVE
RBC UA: <1 /HPF (ref <=4)
SPECIFIC GRAVITY UA: 1.021 (ref 1.003–1.030)
SQUAMOUS EPITHELIAL: 1 /HPF (ref 0–5)
UROBILINOGEN UA: <2
WBC UA: 1 /HPF (ref 0–5)

## 2023-05-06 LAB — COMPREHENSIVE METABOLIC PANEL
ALBUMIN: 3.7 g/dL (ref 3.4–5.0)
ALKALINE PHOSPHATASE: 160 U/L — ABNORMAL HIGH (ref 46–116)
ALT (SGPT): 19 U/L (ref 10–49)
ANION GAP: 7 mmol/L (ref 5–14)
AST (SGOT): 31 U/L (ref <=34)
BILIRUBIN TOTAL: 0.3 mg/dL (ref 0.3–1.2)
BLOOD UREA NITROGEN: 18 mg/dL (ref 9–23)
BUN / CREAT RATIO: 15
CALCIUM: 9.9 mg/dL (ref 8.7–10.4)
CHLORIDE: 104 mmol/L (ref 98–107)
CO2: 25 mmol/L (ref 20.0–31.0)
CREATININE: 1.17 mg/dL — ABNORMAL HIGH
EGFR CKD-EPI (2021) FEMALE: 63 mL/min/{1.73_m2} (ref >=60)
GLUCOSE RANDOM: 268 mg/dL — ABNORMAL HIGH (ref 70–179)
POTASSIUM: 3.8 mmol/L (ref 3.4–4.8)
PROTEIN TOTAL: 7.8 g/dL (ref 5.7–8.2)
SODIUM: 136 mmol/L (ref 135–145)

## 2023-05-06 NOTE — Unmapped (Addendum)
Blood pressure is at goal below 130/80. Continue carvedilol 6.25 mg BID and torsemide 80 mg daily. Encouraged to continue monitoring with cardiology.  Orders:    Comprehensive Metabolic Panel

## 2023-05-06 NOTE — Unmapped (Addendum)
Seen by liver with assessment and plan as follows: CT CAP during ED visit 02/06/21 showed variably enhancing liver lesions, most consistent with hemangioma. 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 Most recent abdominal US revealing for hepatosplenomegaly. No ascites appreciated. Patient followed up with hepatology on 3/28, Fibro Scan unfortunately difficult to interpret due to body habitus. They recommended liver biopsy if clinically indicated. She is reassured her liver function is intact and we do not have concern for cirrhosis. She has simple hepatic steatosis due to metabolic syndrome. Weight loss and tight glucose control should be the focus.     Liver masses: unchanged in  years, non-worrisome at this point for malignancy. Suggest next imaging, MRI with Eovist to help

## 2023-05-06 NOTE — Unmapped (Addendum)
Management as per heme-onc.  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 She started imatinib on 03/27/2021 but her BCR-ABL did not show improvement after 3 months and remained >10%. She switched to bosutinib 500 mg daily on 07/12/21 and has since achieved MMR.  Patient stable on current regimen.  Takes Basutinib 500 daily.

## 2023-05-06 NOTE — Unmapped (Addendum)
Diabetes not at goal with A1c at 10.6 on 03/10/23. Continue metformin 1000 mg BID, Farxiga 10 mg daily and Humulin R U-500 sliding scale with breakfast and dinner.  Hopeful that patient can get a hold of Mounjaro at some point to help with diabetes control as well as weight and appetite.    Your goal fasting glucose is 100-150  Your goal glucose 2h after meals is less than 180  Adjust breakfast/morning U500 to 130 units with breakfast. Monitor your 2h post meal glucose for 2 days, if still above 180, can increase morning dose of U500 by 5 units every 2 days until your sugars 2h after meals are less than 180.   Adjust dinner/evening U500 to 120 units with dinner. Monitor your  fasting glucose for 2 days on this dose, if fasting glucose remains above target 100-150, then can increase dose by 5 units every 2 days until your fasting glucose is at goal 100-150.

## 2023-05-06 NOTE — Unmapped (Addendum)
Resolving

## 2023-05-06 NOTE — Unmapped (Addendum)
Doing well overall since her recent admission. We will discontinue atenolol and switch to carvedilol 6.25 mg BID. No ACEi/ARB/ARNI/MRA due to intolerances/SEs in the past.  Jenna Mosley.  Continue on torsemide 80 mg daily. BMP today.

## 2023-05-06 NOTE — Unmapped (Deleted)
Dog bite given abx 04/26/23    Allergy to augmentin rash and hives    DM eye exam and refraction recently    [place] approval    Bronchitis seen in ER 04/18/23    Zio    CHFpEF:Doing well overall since her recent admission. We will discontinue atenolol and switch to carvedilol 6.25 mg BID. No ACEi/ARB/ARNI/MRA due to intolerances/SEs in the past. She stopped SGLT2i in the past, but has not had any recent UTIs -- will send Rx for dapagliflozin to see if we can get her back on this. Continue on torsemide 80 mg daily. BMP today.     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 She started imatinib on 03/27/2021 but her BCR-ABL did not show improvement after 3 months and remained >10%. She switched to bosutinib 500 mg daily on 07/12/21 and has since achieved MMR.  She presents today for follow up.     Ms. Seaward presents today for follow up appointment. She reports feeling well today, recently tripped at home and has some muscle sorness relieved by Tylenol. She reports her fluid retention has been stable, noticed no change since restarting Bosutinib.  Patient continue to have shortness of breath which is her baseline, trying to some exercises at home. CBC today is stable with no concerns. CMP reveals more stable blood sugar at 202, and stable Cr at 1.09. This level is improved from recent levels as patient was briefly admitted for hyperkalemia and an AKI. She has put on some weight today since her last cardiology appointment on 5/9, about 8 pounds. She reports eating more salty food twice this week, and upon further investigation she has only been taking Torsemide 40 mg, despite her dose recently being increased to 80 mg. Advised patient to increase dose and to reach out her cardiologist for further guidance.  At this point, our team feels that patients other medical conditions are likely contributing more to her fluid retention and it is likely not the Bosutinib. She should continue her Bosutinib 500 mg daily. BCR-ABL drawn today, will follow up on results. Of note, patient still reports a strong interest in consultation for gastric bypass surgery. Our pharmacy team and myself have discussed this in detail, and advised her of the risk and benefits that this could have on her [name]. She does feel very strongly about moving forward, so I encouraged her to go ahead and schedule a consultation appointment to find out if she would qualify. Ultimately if cleared, patient would have to personally weigh all risks/benefits of options to pursue or avoid gastric bypass and make a decision that is best for her. We will discuss further from there next best steps. RTC in 3 months for repeat labs, and BCR-ABL recheck.      NAFLD: CT CAP during ED visit 02/06/21 showed variably enhancing liver lesions, most consistent with hemangioma. 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 Most recent abdominal US revealing for hepatosplenomegaly. No ascites appreciated. Patient followed up with hepatology on 3/28, Fibro Scan unfortunately difficult to interpret due to body habitus. They recommended liver biopsy if clinically indicated. She is reassured her liver function is intact and we do not have concern for cirrhosis. She has simple hepatic steatosis due to metabolic syndrome. Weight loss and tight glucose control should be the focus.     Liver masses: unchanged in  years, non-worrisome at this point for malignancy. Suggest next imaging, MRI with Eovist to help  DM:  Follow with endocrinology.  Patient with better controlled blood sugars recently.annah presents today with highly uncontrolled glucose levels.  Most recent levels from April 6 show TIR 1% with 8% high and 91% very high.  Weight is down 500 --> 370 lbs.   Increase U500 per Dr. Koren Shiver recommendations Dexcom G6 app is restarted and linked to the endo clinic.  Last HbA1c 10% today from 8.2% on 12/03/22<<9.6% on 08/26/22  - contributors to insulin resistence: diet, weight, bosutinib (started 2022, plan for 5 yrs). No overt signs of Cushing's. Normal IGF-1 ruling out acromegaly, normal ICA, GAD65 and high c-peptide (concordant with [name] and insulin resistance).   - glycemic control has worsened due to difficulty with dietary interventions, struggles with binge eating, weight gain, life stressors around moving and mental health dx, as well as poor compliance with SMBG at home.   - CGM downloaded and interpreted x 10/14 days (3/24-02/21/23). Daily trends reviewed including patterns. Highlights in nursing note from today. Patterns include: MBG 338, SD 60, 1% TIR, 0%low level 1, 0% low level 2, 8% high level 1, 91% high level 2, 71% time active sensor use, 18%CV, GMI n/a%. Patient has significant hyperglycemia both fasting and postprandial.   Plan:  Encouraged to resume Mounjaro when available. Managed by Dr. Hale Bogus (Weight management clinic)  Discussed glycemic goals and how to adjust insulin to achieve those goals  Patient Instructions   Your goal fasting glucose is 100-150  Your goal glucose 2h after meals is less than 180  Adjust breakfast/morning U500 to 130 units with breakfast. Monitor your 2h post meal glucose for 2 days, if still above 180, can increase morning dose of U500 by 5 units every 2 days until your sugars 2h after meals are less than 180.   Adjust dinner/evening U500 to 120 units with dinner. Monitor your  fasting glucose for 2 days on this dose, if fasting glucose remains above target 100-150, then can increase dose by 5 units every 2 days until your fasting glucose is at goal 100-150.   Continue Metformin 1000 mg twice  Encouraged continued CGM use for monitoring glucose and insulin adjustments, she is at high risk of complications given high insulin dosis. CDE will assist today with CGM related technical issues.    - Migh consider SGLT2i in the future if not having hypoglycemia episodes, and when we have more glucose data.   - Repeat A1c in 3 months.     Fertility planning: Seen by infertility team. Expresses that she wishes to have children in the future.  She will continue to receive Lupron injections every 3 months at [facility], next scheduled in June.      Pulmonary Hypertension/Fluid Balance:  confirmed moderate pulmonary hypertension with normal cardiac output. Currently being managed medically. She has been having good success with diuretic use to assist in her fluids balance, she is meant to be taking Torsemide 80 mg daily. Was recently switched to Carvedilol and is working on Occupational psychologist for [employer]. Patient has only been taking 40 mg daily and has a recent weight gain of 8 pounds over the past week. I have encouraged her to increase her Torsemide up to the appropriate dose and reach out to her Cardiologist. Denies any worsening shortness of breath, and has been eating more salty foods this week.   dyspnea. Chronic, worsening over the past few months. Followed by Pulmonology. Has had PFTs previously, Pulm planning for repeat at next visit. Previously diagnosed with restrictive lung disease  2/2 obesity. No asthma diagnosis.  Recent sleep study echoed previous sleep studies which were not consistent with [name]. Echo done 12/31/22 with normal EF, only significant for mildly elevated right atrial pressure in the setting of mild Pulmonary HTN. Most likely multifactorial due to OHS, deconditioning, volume overload. Outpatient pulmonary follow up and rehab planned.  Patient was on an asthma treatment plan in the past however this was stopped because it did not help and no obstructive airway disease noted on exam.  I did give patient an albuterol inhaler to take as needed.  Recommend she use an incentive spirometer to exercise her muscles of respiration to see if this will help with some of her restrictive lung disease.  As well I recommend she start exercising again.  She can use her home pulse oximeter and as long as her O2 sat stays above 92% she can continue exercising to improve her endurance.      #HFpEF  - Follow-up with cardiology as scheduled  - Follow-up with pulmonary in one year to ensure no occult pulmonary disease is discovered  - Hold inhalers for now; doubt that albuterol will be helpful  - Diuresis per cardiology    htn

## 2023-05-06 NOTE — Unmapped (Addendum)
Last creatinine was 1.21 on 04/18/23. Will repeat CMP today to recheck kidney and liver function and electrolyte levels.  Orders:    Comprehensive Metabolic Panel

## 2023-05-06 NOTE — Unmapped (Signed)
Patient ID: Jenna Mosley is a 32 y.o. female who presents for follow up of multiple medical problems.  Informant: Patient is accompanied by mother.  Assessment/Plan:     Assessment & Plan  Dysuria  Patient recently discovered an asymptomatic UTI in ED visit. She remains asymptomatic and has completed a course of Keflex. Will repeat UA with culture today. Will treat accordingly based on findings.   Orders:    Urinalysis with Microscopy with Culture Reflex    Comprehensive Metabolic Panel    Bilateral impacted cerumen  Cerumen was removed from the bilateral ears. using gentle irrigation and soft plastic curettes. Copious moderate wax was removed. Patient tolerated the procedure well, without any apparent complications. Irrigation took 5-10 minutes and was done by medical assistant with the use of  luke-warm water rinsing. After irrigation, the left tympanic membrane was visualized with canal clear however right tympanic membrane not visualized with significant earwax still present.  Patient was counseled for dry ear precautions and not using Q-tips.  Refer to ear nose and throat.  Orders:    Ambulatory referral to ENT; Future    Essential (primary) hypertension  Blood pressure is at goal below 130/80. Continue carvedilol 6.25 mg BID and torsemide 80 mg daily. Encouraged to continue monitoring with cardiology.  Orders:    Comprehensive Metabolic Panel    Type 2 diabetes mellitus with diabetic neuropathy, with long-term current use of insulin (CMS-HCC)  Diabetes not at goal with A1c at 10.6 on 03/10/23. Continue metformin 1000 mg BID, Farxiga 10 mg daily and Humulin R U-500 sliding scale with breakfast and dinner.  Hopeful that patient can get a hold of Mounjaro at some point to help with diabetes control as well as weight and appetite.    Your goal fasting glucose is 100-150  Your goal glucose 2h after meals is less than 180  Adjust breakfast/morning U500 to 130 units with breakfast. Monitor your 2h post meal glucose for 2 days, if still above 180, can increase morning dose of U500 by 5 units every 2 days until your sugars 2h after meals are less than 180.   Adjust dinner/evening U500 to 120 units with dinner. Monitor your  fasting glucose for 2 days on this dose, if fasting glucose remains above target 100-150, then can increase dose by 5 units every 2 days until your fasting glucose is at goal 100-150.     Chronic renal impairment, stage 2 (mild)  Last creatinine was 1.21 on 04/18/23. Will repeat CMP today to recheck kidney and liver function and electrolyte levels.  Orders:    Comprehensive Metabolic Panel    Class 3 severe obesity due to excess calories with serious comorbidity and body mass index (BMI) of 45.0 to 49.9 in adult (CMS-HCC)  Weight markedly improved due to recent illness with 20 pound weight loss however weight is currently not at goal. Patient is unable to take [facility] due to lack of availability. Continue working independently to reduce calories through portion control and careful food selection. Continue following up with weight management clinic. Recommend taking brisk walks in the mornings or evenings.  Applauded patient's weight loss and ongoing efforts to keep that weight off.  Clearly she is reap the benefits of improvement in mobility, breathing and general function with the 5% weight loss she has had.       Shortness of breath  Likely multifactorial.  Followed by Pulmonology. Has had PFTs previously, Pulm planning for repeat at next visit. Previously diagnosed with  restrictive lung disease 2/2 obesity. No asthma diagnosis. Previous sleep studies not consistent with [name], referral has been placed for updated sleep study per patient. Echo done 12/31/22 with normal EF, only significant for mildly elevated right atrial pressure in the setting of mild Pulmonary HTN.  Patient followed by both pulmonary and cardiology.  Unfortunately unable to participate in pulmonary rehab due to poorly controlled diabetes.  Patient was on an asthma treatment plan in the past however this was stopped because it did not help and no obstructive airway disease noted on exam.  I did give patient an albuterol inhaler to take as needed.  Recommend she use an incentive spirometer to exercise her muscles of respiration to see if this will help with some of her restrictive lung disease.  She can use her home pulse oximeter when exercising at home and as long as her O2 sat stays above 92% she can continue exercising to improve her endurance.          NASH (nonalcoholic steatohepatitis)  Seen by liver with assessment and plan as follows: CT CAP during ED visit 02/06/21 showed variably enhancing liver lesions, most consistent with hemangioma. 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 Most recent abdominal US revealing for hepatosplenomegaly. No ascites appreciated. Patient followed up with hepatology on 3/28, Fibro Scan unfortunately difficult to interpret due to body habitus. They recommended liver biopsy if clinically indicated. She is reassured her liver function is intact and we do not have concern for cirrhosis. She has simple hepatic steatosis due to metabolic syndrome. Weight loss and tight glucose control should be the focus.     Liver masses: unchanged in  years, non-worrisome at this point for malignancy. Suggest next imaging, MRI with Eovist to help         [name] (chronic myelocytic leukemia) (CMS-HCC)    Management as per heme-onc.  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 She started imatinib on 03/27/2021 but her BCR-ABL did not show improvement after 3 months and remained >10%. She switched to bosutinib 500 mg daily on 07/12/21 and has since achieved MMR.  Patient stable on current regimen.  Takes Basutinib 500 daily.         Allergic reaction to Augmentin         Chronic heart failure with preserved ejection fraction (CMS-HCC)    Doing well overall since her recent admission. We will discontinue atenolol and switch to carvedilol 6.25 mg BID. No ACEi/ARB/ARNI/MRA due to intolerances/SEs in the past.  Windell Moment.  Continue on torsemide 80 mg daily. BMP today.            Dog bite, hand, unspecified laterality, sequela  Resolving           Preventive services addressed today  We did not review preventive services today  -- Lifestyle changes:  work on improving diet (food selection, portion control) and increase daily exercise  -- Patient verbalized an understanding of today's assessment and recommendations, as well as the purpose of ongoing medications.  Return in about 3 months (around 08/06/2023).     Subjective:   HPI  The patient presents for evaluation of multiple medical issues and to re-check urine.     Patient was evaluated at [facility] ED on 04/18/23 for bronchitis and UTI. She presented with a 4 day history of a URI illness with minimally productive cough, intermittent fevers (Tmax 101F), chills, myalgias, generalized headache, nausea, and pleuritic chest pain. ED course  is below:    CBC and CMP remarkable for creatinine 1.21, consistent with prior values. Lactate elevated at 3.2. IV fluids given. No additional fluids given due to HF history. Flu, COVID, and RSV swabs negative. UA concerning for infection given leukocyte esterase and bacteria. CXR shows mild bronchial wall thickening. EKG shows normal sinus at 92.     Patient's symptoms have improved with IV fluids and toradol. She is agreeable to discharge with prescription for nitrofurantoin for UTI and tessalon perles and guaifenesin AC for bronchitis. Return precautions given.     Patient reports feeling well overall. She has no residual SOB or cough. She has lost her sense of smell and taste. She reports a loss of appetite while sick and has lost about 20 lbs.     CHF/hypertension/palpitations:  Pt continues to follow with Dr. Verdis Frederickson at [facility]. Last visit was 03/26/23. She is doing well since her last admission and is compliant with carvedilol 6.25 mg BID and torsemide 80 mg daily. he describes recent palpitations. She states that they feel intense, like her heart is going to come out of chest. She recently completed a Zio patch however results are not available yet.     Diabetes:   Continues treatment with metformin 1000 mg BID, Farxiga 10 mg daily and Humulin R U-500 sliding scale with breakfast and dinner. Her CGM has not been working, but mother reports blood sugars have been stable overall at home. No significant hyper- or hypoglycemia events reported. She continues to follow with [facility] diabetes clinic, with her last visit in April 2024.   Most recent labs show:  Lab Results   Component Value Date    A1C 10.6 (H) 03/10/2023    A1C 8.2 (H) 12/03/2022    A1C 9.6 (H) 08/26/2022     Obesity: Pt has lost about 20 lbs over the last month, mostly attributed to loss of appetite while sick. She was prescribed Mounjaro but has had trouble acquiring it due to back order. She reports cutting all carbs out of her diet and limiting her sugar intake. Pt continues to express interest in bariatric surgery. Her oncologist and cardiologist cleared her for surgery. If she can avoid  hospitalization and achieve an A1c goal of 7-8, they will proceed with surgery. Pt continues to follow with weight management clinic, last seen in February of this year.    NASH (nonalcoholic steatohepatitis):  Pt is followed by [facility]. Her last visit with NP Eppie Gibson was in March 2024. She was reassured her liver function was intact and there is no concern for cirrhosis. She has simple hepatic steatosis due to metabolic syndrome. Recommended focusing on weight loss and tight glucose control. Plan was to perform FibroScan in a month and MRI next spring.    Dog bite:  Patient was seen at [facility] on 04/26/23 for a dog bite. At this visit, she verbalized being bitten by her own dog a few days prior. She described worsening pain and swelling to index and ring finger. Positive for color change, otherwise unremarkable findings. The bites are nearly healed and pt reports feeling much better. She reports a new allergic reaction to Augmentin with a rash and angioedema.    A comprehensive review of systems was conducted with negative results except as noted in the HPI above.          Objective:   Vital Signs  BP 128/82 (BP Site: L Wrist, BP Position: Sitting, BP Cuff Size:  Medium)  - Pulse 80  - Ht 182.9 cm (6' 0.01)  - Wt (!) 165.2 kg (364 lb 3.2 oz)  - LMP  (LMP Unknown)  - SpO2 98%  - Breastfeeding No  - BMI 49.38 kg/m??  Body mass index is 49.38 kg/m??.  Exam  Physical Exam  Constitutional:       Appearance: Normal appearance.   HENT:      Head: Normocephalic and atraumatic.      Right Ear: Tympanic membrane, ear canal and external ear normal. There is impacted cerumen.      Left Ear: Tympanic membrane, ear canal and external ear normal. There is impacted cerumen.      Nose: Nose normal.      Mouth/Throat:      Mouth: Mucous membranes are moist.      Pharynx: Oropharynx is clear.   Eyes:      Extraocular Movements: Extraocular movements intact.      Conjunctiva/sclera: Conjunctivae normal.      Pupils: Pupils are equal, round, and reactive to light.   Cardiovascular:      Rate and Rhythm: Normal rate and regular rhythm.      Pulses: Normal pulses.      Heart sounds: Normal heart sounds.   Pulmonary:      Effort: Pulmonary effort is normal.      Breath sounds: Normal breath sounds.   Abdominal:      General: Bowel sounds are normal. There is distension.      Palpations: Abdomen is soft.      Tenderness: There is no abdominal tenderness. There is no right CVA tenderness or left CVA tenderness.   Musculoskeletal:      Right lower leg: No edema.      Left lower leg: No edema.   Lymphadenopathy:      Cervical: No cervical adenopathy.      Upper Body:      Right upper body: No supraclavicular or axillary adenopathy.      Left upper body: No supraclavicular or axillary adenopathy.   Skin:     General: Skin is warm.      Comments: Laceration that is healing without infection on her 4th digit at DIP. Small laceration between DIP and PIP on index finger that is also healing without infection.   Neurological:      General: No focal deficit present.      Mental Status: She is alert. Mental status is at baseline.   Psychiatric:         Mood and Affect: Mood normal.         Behavior: Behavior normal.         Thought Content: Thought content normal.               I've personally reviewed and summarized records in EPIC/Media and via CareEverywhere as well as medication, allergies, past medical, social, and family history.   I have reviewed the patient's medical history in detail and updated the computerized patient record.      Note - This chart has been prepared using the Dragon voice recognition system. Typographical errors may have occurred.  Attempts have been made to correct errors, however, inadvertent errors may persist.     I attest that I, Mardi Mainland, personally documented this note while acting as scribe for Jannah Guardiola Joeseph Amor, MD.      Mardi Mainland, Scribe.  05/06/2023     The documentation recorded by the scribe accurately  reflects the service I personally performed and the decisions made by me.    Earlee Herald Joeseph Amor, MD

## 2023-05-06 NOTE — Unmapped (Addendum)
Likely multifactorial.  Followed by Pulmonology. Has had PFTs previously, Pulm planning for repeat at next visit. 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 No asthma diagnosis. Previous sleep studies not consistent with [name], referral has been placed for updated sleep study per patient. Echo done 12/31/22 with normal EF, only significant for mildly elevated right atrial pressure in the setting of mild Pulmonary HTN.  Patient followed by both pulmonary and cardiology.  Unfortunately unable to participate in pulmonary rehab due to poorly controlled diabetes.  Patient was on an asthma treatment plan in the past however this was stopped because it did not help and no obstructive airway disease noted on exam.  I did give patient an albuterol inhaler to take as needed.  Recommend she use an incentive spirometer to exercise her muscles of respiration to see if this will help with some of her restrictive lung disease.  She can use her home pulse oximeter when exercising at home and as long as her O2 sat stays above 92% she can continue exercising to improve her endurance.

## 2023-05-06 NOTE — Unmapped (Addendum)
Weight markedly improved due to recent illness with 20 pound weight loss however weight is currently not at goal. Patient is unable to take [facility] due to lack of availability. Continue working independently to reduce calories through portion control and careful food selection. Continue following up with weight management clinic. Recommend taking brisk walks in the mornings or evenings.  Applauded patient's weight loss and ongoing efforts to keep that weight off.  Clearly she is reap the benefits of improvement in mobility, breathing and general function with the 5% weight loss she has had.

## 2023-05-07 NOTE — Unmapped (Signed)
[facility] Refill Coordination Note    Specialty Medication(s) to be Shipped:   Hematology/Oncology: Bosulif    Other medication(s) to be shipped: No additional medications requested for fill at this time     Jenna Mosley, DOB: Jan 19, 1991  Phone: [phone] (home) [phone] (work)      All above HIPAA information was verified with patient.     Was a Nurse, learning disability used for this call? No    Completed refill call assessment today to schedule patient's medication shipment from the [facility] [phone]).  All relevant notes have been reviewed.     Specialty medication(s) and dose(s) confirmed: Regimen is correct and unchanged.   Changes to medications: [name] reports starting the following medications: Carvedilol and Farxiga  Changes to insurance: No  New side effects reported not previously addressed with a pharmacist or physician: None reported  Questions for the pharmacist: No    Confirmed patient received a Conservation officer, historic buildings and a Surveyor, mining with first shipment. The patient will receive a drug information handout for each medication shipped and additional FDA Medication Guides as required.       DISEASE/MEDICATION-SPECIFIC INFORMATION        N/A    SPECIALTY MEDICATION ADHERENCE     Medication Adherence    Patient reported X missed doses in the last month: 1  Specialty Medication: Bosulif 500mg   Patient is on additional specialty medications: No  Informant: patient     Were doses missed due to medication being on hold? No    Bosulif 500mg : 6 days of medicine on hand       REFERRAL TO PHARMACIST     Referral to the pharmacist: Not needed      [facility]     Shipping address confirmed in Epic.     Delivery Scheduled: Yes, Expected medication delivery date: 05/09/23.     Medication will be delivered via Next Day Courier to the prescription address in Epic [place].    Jenna Mosley M Jenna Mosley   [facility] Specialty Technician

## 2023-05-07 NOTE — Unmapped (Signed)
Labs are normal.  Urine has a tremendous amount of glucose because of the medications you are taking which can increase glucose output from the kidneys Jenna Mosley).  Otherwise no signs of infection.

## 2023-05-14 ENCOUNTER — Ambulatory Visit: Admit: 2023-05-14 | Discharge: 2023-05-15 | Payer: MEDICAID

## 2023-05-14 DIAGNOSIS — I5032 Chronic diastolic (congestive) heart failure: Principal | ICD-10-CM

## 2023-05-14 DIAGNOSIS — I272 Pulmonary hypertension, unspecified: Principal | ICD-10-CM

## 2023-05-14 NOTE — Unmapped (Signed)
[facility] Terrence Dupont  [address] - Phone: [phone]  [place], [place] [address] - Fax: [phone]   Date of Service: 05/14/2023    Assessment/Plan:   1. Chronic heart failure with preserved ejection fraction (CMS-HCC)  2. Pulmonary hypertension (CMS-HCC)  Overall doing well overall since her last visit. Continue on carvedilol 6.25 mg BID, torsemide 80 mg daily, and dapagliflozin 10 mg daily. No ACEi/ARB/ARNI/MRA due to intolerances/SEs in the past. Her Ziopatch was reassuring, most triggered events were NSR/sinus tach. We talked about dietary modifications such as cutting down on caffeine.    Return to clinic: Return in about 3 months (around 08/14/2023).    I personally spent 35 minutes face-to-face and non-face-to-face in the care of this patient, which includes all pre, intra, and post visit time on the date of service.    Documentation assistance was provided by Mirian Capuchin, Scribe on May 14, 2023 8:38 AM  for Gwynne Edinger, MD.     I have reviewed the documentation provided by the scribe and confirm that it accurately reflects the service I personally performed and the decisions made by me.  Signature: KAF  Date: 05/14/23   Time: 8:38 AM        Kathreen Devoid. Verdis Frederickson, MD, [facility]  Assistant Professor of Medicine  Division of Cardiology  [place] [facility]    Subjective:   ZHY:QMVHQ, Mauricio Po, MD  Patient ID: Jenna Mosley is an 32 y.o. female patient with HTN, pulmonary HTN, and HFpEF, referred by Nurum Joeseph Amor, presenting for the evaluation and treatment of HFpEF.    Per chart review, RHC was performed which showed elevated filling pressures with normal cardiac output. She was started on spironolactone but did not tolerate this due to hyperkalemia and AKI, which required a brief admission for hyperkalemia management. She continues to endorse shortness of breath following eating high sodium foods. Fluids are well controlled with torsemide. She was seen at the ED on 03/18/23 for 4 day history of a URI illness, symptoms have improved with IV fluids and toradol. She was provided a prescription for nitrofurantoin for UTI.      At last visit: She was advised to discontinue her atenolol, she was stared on carvedilol 6.25 mg BID. No other changes were made to her treatment plan. BMP labs were ordered.     Interval History:  Today Jenna Mosley presents to the clinic accompanied and ambulating without external support. She has been doing well overall. She continues to have palpitations since her last visit, occurring both at rest and while active. These episodes have not limited her ability to perform her normal activities of daily living. We went over her Ziopatch results at length today. She did note that she had URI symptoms around the time the Ziopatch was done. Denies CP, SOB, DOE, presyncope/syncope, edema, orthostasis, or orthopnea. Also of note, she asked about caffeine intake -- she drinks several liters of Diet Dr. Reino Kent daily.    Objective:   Physical Exam:  BP 125/59  - Pulse 60  - Temp 36.6 ??C (97.8 ??F)  - Resp 20  - Ht 182.9 cm (6')  - Wt (!) 165.4 kg (364 lb 9.6 oz)  - LMP  (LMP Unknown)  - SpO2 97%  - BMI 49.45 kg/m??   GEN: adult female patient in NAD  HEENT: NCAT, sclerae anicteric, OP clear  NECK: flat neck veins  CARD: RRR   S1/S2 audible   no murmurs, rubs,  or gallops  RESP: CTAB, no wheezes, crackles, or ronchi, normal work of breathing  ABDO: soft, NT/ND  EXTREM: WWP, PPP, no edema  NEURO: AAO, CN II-XII grossly normal, moving all 4 extremities  SKIN: warm and dry    Notable results:  Labs:   Lab Results   Component Value Date    WBC 11.2 04/18/2023    HGB 12.5 04/18/2023    HCT 37.2 04/18/2023    PLT 286 04/18/2023     Lab Results   Component Value Date    NA 136 05/06/2023    K 3.8 05/06/2023    CREATININE 1.17 (H) 05/06/2023      Lab Results   Component Value Date    CHOL 166 08/26/2022    LDL 81 08/26/2022    HDL 30 (L) 08/26/2022    TRIG 274 (H) 08/26/2022     Lab Results   Component Value Date    A1C 10.6 (H) 03/10/2023      Lab Results   Component Value Date    TROPONINI 8 04/18/2023    TROPONINI <0.300 10/24/2019    TROPONINI <0.034 08/04/2019        Imaging/Other:  Electrocardiogram:  From 04/18/23 showed: Normal sinus rhythm.     Ziopatch:  From 5/31-6/07/24 showed: Patient had a min HR of 63 bpm, max HR of 126 bpm, and avg HR of 83 bpm. Predominant underlying rhythm was Sinus Rhythm. Junctional Rhythm was present. Junctional Rhythm was detected within +/- 45 seconds of symptomatic patient event(s). Isolated SVEs were rare (<1.0%), SVE Couplets were rare (<1.0%), and no SVE Triplets were present. No Isolated VEs, VE Couplets, or VE Triplets were present. There were 46 patient triggered events.  5 episodes corresponded to premature atrial contraction, 1 episode corresponded to labile of junctional rhythm although this may have just been ectopic atrial tachycardia with low voltage P wave in the lead.  The remaining episodes all corresponded normal sinus rhythm.     The ASCVD Risk score (Arnett DK, et al., 2019) failed to calculate.

## 2023-05-14 NOTE — Unmapped (Signed)
Ziopatch looks OK  No changes to medications

## 2023-05-14 NOTE — Unmapped (Signed)
Pt is here for Zio results.  No new symptoms.
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
## 2023-05-18 ENCOUNTER — Ambulatory Visit: Admit: 2023-05-18 | Discharge: 2023-05-19 | Payer: MEDICAID

## 2023-05-18 DIAGNOSIS — H6123 Impacted cerumen, bilateral: Principal | ICD-10-CM

## 2023-06-11 NOTE — Unmapped (Signed)
[facility] Clinical Assessment & Refill Coordination Note    Jenna Mosley, DOB: September 16, 1991  Phone: [phone] (home) [phone] (work)    All above HIPAA information was verified with patient.     Was a Nurse, learning disability used for this call? No    Specialty Medication(s):   Hematology/Oncology: Bosulif     Current Outpatient Medications   Medication Sig Dispense Refill    acetaminophen (TYLENOL) 500 MG tablet Take 2 tablets (1,000 mg total) by mouth every six (6) hours as needed for pain. Extra strength tylenol      albuterol HFA 90 mcg/actuation inhaler Inhale 2 puffs every six (6) hours as needed. 8 g 0    alcohol swabs (ALCOHOL PADS) PadM Apply 1 Swab topically Three (3) times a day before meals. 100 each 3    blood sugar diagnostic (ACCU-CHEK GUIDE TEST STRIPS) Strp Check sugars before meals three times for insulin dependent type two diabetes. E11.65 100 each 11    blood-glucose meter,continuous (DEXCOM G6 RECEIVER) Misc Dispense DexCom G6 Receiver 1 each 3    blood-glucose sensor (DEXCOM G6 SENSOR) Devi USE 1 SENSOR EVERY 10 DAYS AS DIRECTED 3 each 11    blood-glucose transmitter (DEXCOM G6 TRANSMITTER) Devi Use 1 every 90 days 1 each 3    bosutinib 500 mg Tab Take 1 tablet (500 mg total) by mouth once daily. Administer with food. Swallow tablet whole; do not cut, crush, break, or chew. 30 tablet 2    calcium carbonate-vitamin D3 600 mg-20 mcg (800 unit) Tab Take 1 mg by mouth Two (2) times a day (at 8am and 12:00).      carvedilol (COREG) 6.25 MG tablet Take 1 tablet (6.25 mg total) by mouth in the morning and 1 tablet (6.25 mg total) in the evening. Take with meals. 60 tablet 6    cetirizine (ZYRTEC) 10 MG tablet TAKE 1 TABLET BY MOUTH IN THE MORNING. 90 tablet 3    cholecalciferol, vitamin D3, (VITAMIN D3 ORAL) Take by mouth.      clonazePAM (KLONOPIN) 0.5 MG tablet Take 1 tablet (0.5 mg total) by mouth daily as needed. PRN      cyclobenzaprine (FLEXERIL) 5 MG tablet Take 1 tablet (5 mg total) by mouth Three (3) times a day as needed (lower back pain). 90 tablet 1    dapagliflozin propanediol (FARXIGA) 10 mg Tab tablet Take 1 tablet (10 mg total) by mouth every morning. 90 tablet 3    diclofenac sodium (VOLTAREN) 1 % gel Apply 4 g topically four (4) times a day. 300 g 1    divalproex ER (DEPAKOTE ER) 500 MG extended released 24 hr tablet TAKE 1 TABLET BY MOUTH AT BEDTIME 90 tablet 3    ferrous sulfate 325 (65 FE) MG tablet Take 1 tablet (325 mg total) by mouth in the morning.      fluticasone propionate (FLONASE) 50 mcg/actuation nasal spray 2 sprays into each nostril daily. 16 g 0    hydrOXYzine (VISTARIL) 50 MG capsule Take 2 capsules (100 mg total) by mouth nightly. And takes PRN      insulin regular hum U-500 conc (HUMULIN R U-500, CONC, KWIKPEN) 500 unit/mL (3 mL) CONCENTRATED injection Inject 150 Units under the skin Two (2) times a day (30 minutes before a meal). 54 mL 3    lamoTRIgine (LAMICTAL) 150 MG tablet Take 1 tablet (150 mg total) by mouth two (2) times a day.      lancets (ACCU-CHEK SOFTCLIX  LANCETS) Misc Check sugar three times per day before meals for insulin dependent type two diabetes.  E11.65 100 each 11    leuprolide acetate (LUPRON DEPOT IM) Inject into the muscle. Every 3 months      metFORMIN (GLUCOPHAGE) 1000 MG tablet TAKE ONE TABLET BY MOUTH TWICE DAILY IN THE MORNING AND IN THE EVENING. TAKE WITH MEALS 180 tablet 0    norethindrone (AYGESTIN) 5 mg tablet TAKE ONE TABLET EVERY MORNING 30 tablet 11    pantoprazole (PROTONIX) 40 MG tablet TAKE 1 TABLET BY MOUTH IN THE MORNING. 90 tablet 3    pregabalin (LYRICA) 75 MG capsule TAKE 1 CAPSULE BY MOUTH IN THE MORNING AND 2 CAPSULES BY MOUTH IN THE EVENING. 270 capsule 0    TECHLITE PEN NEEDLE 32 gauge x 1/4 (6 mm) Ndle USE 3 TIMES DAILY AS DIRECTED 300 each 4    tirzepatide (MOUNJARO) 10 mg/0.5 mL PnIj 10 mg subcutaneous weekly 2 mL 0    torsemide (DEMADEX) 20 MG tablet TAKE FOUR TABLETS BY MOUTH EVERY DAY 120 tablet 6    traZODone (DESYREL) 100 MG tablet TAKE 2 TABLETS BY MOUTH AT BEDTIME 180 tablet 3    UNABLE TO FIND Take by mouth daily. Med Name: vitamin D 1,000 mg daily.  Patient does not have medication yet, but plan on picking it up      ziprasidone (GEODON) 80 MG capsule Take 1 capsule (80 mg total) by mouth in the morning and 1 capsule (80 mg total) in the evening. Take with meals.       No current facility-administered medications for this visit.     Facility-Administered Medications Ordered in Other Visits   Medication Dose Route Frequency Provider Last Rate Last Admin    leuprolide (LUPRON) injection 11.25 mg  11.25 mg Intramuscular Once Coombs, Leta Speller, MD            Changes to medications: Brehanna reports no changes at this time.    Allergies   Allergen Reactions    Augmentin [Amoxicillin-Pot Clavulanate] Angioedema     Rash and angioedema    Lisinopril Shortness Of Breath     Other Reaction(s): chest pain    SOB, chest painSOB, chest pain    Naproxen Nausea Only, Palpitations and Other (See Comments)     Chest palpitations and feels like flying    Aripiprazole Other (See Comments)     Inability to urinate    Other Reaction(s): cannot void    Ciprofloxacin Other (See Comments)     Does not work    Fluphenazine      mental health problems    Metoclopramide Other (See Comments)     Mania    ManiaMania    Prednisone Other (See Comments)     mania    Reglan [Metoclopramide Hcl] Other (See Comments)     Induces mania    Diphenhydramine Hcl Anxiety     Other Reaction(s): mania    Multihance [Gadobenate Dimeglumine] Nausea And Vomiting    Ondansetron Hcl Anxiety    Promethazine Anxiety       Changes to allergies: No    SPECIALTY MEDICATION ADHERENCE     Bosulif 500 mg: 10 days of medicine on hand     Medication Adherence    Patient reported X missed doses in the last month: >5  Specialty Medication: Bosutinib 500 mg once daily  Patient is on additional specialty medications: No  Informant: patient Other non-adherence reason: drug was not put  into pillpak             Specialty medication(s) dose(s) confirmed: Regimen is correct and unchanged.     Are there any concerns with adherence? Yes: Company she uses for pillpack was not putting in the [employer].  She did not realize until recently and has talked to company.      Adherence counseling provided? Not needed    CLINICAL MANAGEMENT AND INTERVENTION      Clinical Benefit Assessment:    Do you feel the medicine is effective or helping your condition? Patient declined to answer    Clinical Benefit counseling provided? Not needed    Adverse Effects Assessment:    Are you experiencing any side effects? No    Are you experiencing difficulty administering your medicine? No    Quality of Life Assessment:    Quality of Life    Rheumatology  Oncology  Dermatology  Cystic Fibrosis          How many days over the past month did your condition  keep you from your normal activities? For example, brushing your teeth or getting up in the morning. 0    Have you discussed this with your provider? Not needed    Acute Infection Status:    Acute infections noted within Epic:  No active infections  Patient reported infection: None    Therapy Appropriateness:    Is therapy appropriate and patient progressing towards therapeutic goals? Yes, therapy is appropriate and should be continued    DISEASE/MEDICATION-SPECIFIC INFORMATION      N/A    Oncology: Is the patient receiving adequate infection prevention treatment? Not applicable  Does the patient have adequate nutritional support? Not applicable    PATIENT SPECIFIC NEEDS     Does the patient have any physical, cognitive, or cultural barriers? No    Is the patient high risk? Yes, patient is taking oral chemotherapy. Appropriateness of therapy as been assessed    Did the patient require a clinical intervention? No    Does the patient require physician intervention or other additional services (i.e., nutrition, smoking cessation, social work)? No    SOCIAL DETERMINANTS OF HEALTH     At the [facility], we have learned that life circumstances - like trouble affording food, housing, utilities, or transportation can affect the health of many of our patients.   That is why we wanted to ask: are you currently experiencing any life circumstances that are negatively impacting your health and/or quality of life? No    Social Determinants of Health     Financial Resource Strain: Medium Risk (01/01/2023)    Overall Financial Resource Strain (CARDIA)     Difficulty of Paying Living Expenses: Somewhat hard   Internet Connectivity: Not on file   Food Insecurity: Food Insecurity Present (01/01/2023)    Hunger Vital Sign     Worried About Running Out of Food in the Last Year: Sometimes true     Ran Out of Food in the Last Year: Sometimes true   Tobacco Use: Medium Risk (05/18/2023)    Patient History     Smoking Tobacco Use: Former     Smokeless Tobacco Use: Never     Passive Exposure: Not on file   Housing/Utilities: Low Risk  (01/01/2023)    Housing/Utilities     Within the past 12 months, have you ever stayed: outside, in a car, in a tent, in an overnight shelter, or temporarily in someone else's home (i.e. couch-surfing)?: No  Are you worried about losing your housing?: No     Within the past 12 months, have you been unable to get utilities (heat, electricity) when it was really needed?: No   Alcohol Use: Not At Risk (05/14/2021)    Alcohol Use     How often do you have a drink containing alcohol?: Never     How many drinks containing alcohol do you have on a typical day when you are drinking?: Not on file     How often do you have 5 or more drinks on one occasion?: Not on file   Transportation Needs: No Transportation Needs (01/01/2023)    PRAPARE - Transportation     Lack of Transportation (Medical): No     Lack of Transportation (Non-Medical): No   Substance Use: Not on file   Health Literacy: Medium Risk (05/14/2021)    Health Literacy     : Sometimes Physical Activity: Not on file   Interpersonal Safety: Unknown (06/11/2023)    Interpersonal Safety     Unsafe Where You Currently Live: Not on file     Physically Hurt by Anyone: Not on file     Abused by Anyone: Not on file   Stress: Not on file   Intimate Partner Violence: Not on file   Depression: Not at risk (04/10/2021)    PHQ-2     PHQ-2 Score: 2   Recent Concern: Depression - At risk (04/02/2021)    PHQ-2     PHQ-2 Score: 3   Social Connections: Not on file       Would you be willing to receive help with any of the needs that you have identified today? Not applicable       SHIPPING     Specialty Medication(s) to be Shipped:   Hematology/Oncology: Bosulif    Other medication(s) to be shipped: No additional medications requested for fill at this time     Changes to insurance: No    Delivery Scheduled: Yes, Expected medication delivery date: 06/17/23.     Medication will be delivered via Next Day Courier to the confirmed prescription address in [facility].    The patient will receive a drug information handout for each medication shipped and additional FDA Medication Guides as required.  Verified that patient has previously received a Conservation officer, historic buildings and a Surveyor, mining.    The patient or caregiver noted above participated in the development of this care plan and knows that they can request review of or adjustments to the care plan at any time.      All of the patient's questions and concerns have been addressed.    Tobi Groesbeck Vangie Bicker, PharmD   [facility] Specialty Pharmacist

## 2023-06-16 MED FILL — BOSULIF 500 MG TABLET: 30 days supply | Qty: 30 | Fill #1

## 2023-06-23 NOTE — Unmapped (Signed)
A1c Outreach Complete
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
## 2023-06-30 ENCOUNTER — Other Ambulatory Visit: Admit: 2023-06-30 | Discharge: 2023-07-01 | Payer: PRIVATE HEALTH INSURANCE

## 2023-06-30 ENCOUNTER — Ambulatory Visit
Admit: 2023-06-30 | Discharge: 2023-07-01 | Payer: PRIVATE HEALTH INSURANCE | Attending: Student in an Organized Health Care Education/Training Program | Primary: Student in an Organized Health Care Education/Training Program

## 2023-06-30 DIAGNOSIS — C9210 Chronic myeloid leukemia, BCR/ABL-positive, not having achieved remission: Principal | ICD-10-CM

## 2023-06-30 LAB — CBC W/ AUTO DIFF
BASOPHILS ABSOLUTE COUNT: 0.1 10*9/L (ref 0.0–0.1)
BASOPHILS RELATIVE PERCENT: 1 %
EOSINOPHILS ABSOLUTE COUNT: 0.4 10*9/L (ref 0.0–0.5)
EOSINOPHILS RELATIVE PERCENT: 3.5 %
HEMATOCRIT: 38 % (ref 34.0–44.0)
HEMOGLOBIN: 12.6 g/dL (ref 11.3–14.9)
LYMPHOCYTES ABSOLUTE COUNT: 3.4 10*9/L (ref 1.1–3.6)
LYMPHOCYTES RELATIVE PERCENT: 31.2 %
MEAN CORPUSCULAR HEMOGLOBIN CONC: 33.1 g/dL (ref 32.0–36.0)
MEAN CORPUSCULAR HEMOGLOBIN: 28 pg (ref 25.9–32.4)
MEAN CORPUSCULAR VOLUME: 84.5 fL (ref 77.6–95.7)
MEAN PLATELET VOLUME: 8.1 fL (ref 6.8–10.7)
MONOCYTES ABSOLUTE COUNT: 0.6 10*9/L (ref 0.3–0.8)
MONOCYTES RELATIVE PERCENT: 5.8 %
NEUTROPHILS ABSOLUTE COUNT: 6.4 10*9/L (ref 1.8–7.8)
NEUTROPHILS RELATIVE PERCENT: 58.5 %
PLATELET COUNT: 308 10*9/L (ref 150–450)
RED BLOOD CELL COUNT: 4.5 10*12/L (ref 3.95–5.13)
RED CELL DISTRIBUTION WIDTH: 15.6 % — ABNORMAL HIGH (ref 12.2–15.2)
WBC ADJUSTED: 11 10*9/L (ref 3.6–11.2)

## 2023-06-30 LAB — COMPREHENSIVE METABOLIC PANEL
ALBUMIN: 3.8 g/dL (ref 3.4–5.0)
ALKALINE PHOSPHATASE: 167 U/L — ABNORMAL HIGH (ref 46–116)
ALT (SGPT): 15 U/L (ref 10–49)
ANION GAP: 10 mmol/L (ref 5–14)
AST (SGOT): 26 U/L (ref <=34)
BILIRUBIN TOTAL: 0.3 mg/dL (ref 0.3–1.2)
BLOOD UREA NITROGEN: 17 mg/dL (ref 9–23)
BUN / CREAT RATIO: 11
CALCIUM: 9.5 mg/dL (ref 8.7–10.4)
CHLORIDE: 104 mmol/L (ref 98–107)
CO2: 24 mmol/L (ref 20.0–31.0)
CREATININE: 1.51 mg/dL — ABNORMAL HIGH
EGFR CKD-EPI (2021) FEMALE: 46 mL/min/{1.73_m2} — ABNORMAL LOW (ref >=60)
GLUCOSE RANDOM: 197 mg/dL — ABNORMAL HIGH (ref 70–179)
POTASSIUM: 3.8 mmol/L (ref 3.4–4.8)
PROTEIN TOTAL: 8 g/dL (ref 5.7–8.2)
SODIUM: 138 mmol/L (ref 135–145)

## 2023-06-30 NOTE — Unmapped (Addendum)
Please stay hydrated, your creatinine is a little bit elevated. Conitnue your Bosutinib, I will reach out in a couple of days with your BCR-Abl results.    Lets recheck your Cr next week.     Your blood counts look stalbe.    If BCR-ABL looks good I will see you back in 3 months!     If you have questions or concerns, you may call the Nurse call line at [phone].    Lab on 06/30/2023   Component Date Value Ref Range Status    Sodium 06/30/2023 138  135 - 145 mmol/L Final    Potassium 06/30/2023 3.8  3.4 - 4.8 mmol/L Final    Chloride 06/30/2023 104  98 - 107 mmol/L Final    CO2 06/30/2023 24.0  20.0 - 31.0 mmol/L Final    Anion Gap 06/30/2023 10  5 - 14 mmol/L Final    BUN 06/30/2023 17  9 - 23 mg/dL Final    Creatinine 44/01/4741 1.51 (H)  0.55 - 1.02 mg/dL Final    BUN/Creatinine Ratio 06/30/2023 11   Final    eGFR CKD-EPI (2021) Female 06/30/2023 46 (L)  >=60 mL/min/1.41m2 Final    eGFR calculated with CKD-EPI 2021 equation in accordance with [employer] and [employer] of Nephrology Task Force recommendations.    Glucose 06/30/2023 197 (H)  70 - 179 mg/dL Final    Calcium 59/56/3875 9.5  8.7 - 10.4 mg/dL Final    Albumin 64/33/2951 3.8  3.4 - 5.0 g/dL Final    Total Protein 06/30/2023 8.0  5.7 - 8.2 g/dL Final    Total Bilirubin 06/30/2023 0.3  0.3 - 1.2 mg/dL Final    AST 88/41/6606 26  <=34 U/L Final    ALT 06/30/2023 15  10 - 49 U/L Final    Alkaline Phosphatase 06/30/2023 167 (H)  46 - 116 U/L Final    WBC 06/30/2023 11.0  3.6 - 11.2 10*9/L Final    RBC 06/30/2023 4.50  3.95 - 5.13 10*12/L Final    HGB 06/30/2023 12.6  11.3 - 14.9 g/dL Final    HCT 30/16/0109 38.0  34.0 - 44.0 % Final    MCV 06/30/2023 84.5  77.6 - 95.7 fL Final    MCH 06/30/2023 28.0  25.9 - 32.4 pg Final    MCHC 06/30/2023 33.1  32.0 - 36.0 g/dL Final    RDW 32/35/5732 15.6 (H)  12.2 - 15.2 % Final    MPV 06/30/2023 8.1  6.8 - 10.7 fL Final    Platelet 06/30/2023 308  150 - 450 10*9/L Final    Neutrophils % 06/30/2023 58.5  % Final    Lymphocytes % 06/30/2023 31.2  % Final    Monocytes % 06/30/2023 5.8  % Final    Eosinophils % 06/30/2023 3.5  % Final    Basophils % 06/30/2023 1.0  % Final    Absolute Neutrophils 06/30/2023 6.4  1.8 - 7.8 10*9/L Final    Absolute Lymphocytes 06/30/2023 3.4  1.1 - 3.6 10*9/L Final    Absolute Monocytes 06/30/2023 0.6  0.3 - 0.8 10*9/L Final    Absolute Eosinophils 06/30/2023 0.4  0.0 - 0.5 10*9/L Final    Absolute Basophils 06/30/2023 0.1  0.0 - 0.1 10*9/L Final

## 2023-07-03 DIAGNOSIS — E1165 Type 2 diabetes mellitus with hyperglycemia: Principal | ICD-10-CM

## 2023-07-03 DIAGNOSIS — Z794 Long term (current) use of insulin: Principal | ICD-10-CM

## 2023-07-03 MED ORDER — METFORMIN 1,000 MG TABLET
ORAL_TABLET | 3 refills | 0 days | Status: CP
Start: 2023-07-03 — End: ?

## 2023-07-03 NOTE — Unmapped (Signed)
Patient is requesting the following refill  Requested Prescriptions     Pending Prescriptions Disp Refills    metFORMIN (GLUCOPHAGE) 1000 MG tablet [Pharmacy Med Name: METFORMIN HCL 1000 MG TAB] 180 tablet 0     Sig: TAKE ONE TABLET BY MOUTH TWICE DAILY IN THE MORNING AND IN THE EVENING. TAKE WITH MEALS       Order pended. Please advise. Thanks    Last OV: 05/06/2023   Next OV: Visit date not found

## 2023-07-06 ENCOUNTER — Ambulatory Visit: Admit: 2023-07-06 | Discharge: 2023-07-06 | Payer: PRIVATE HEALTH INSURANCE

## 2023-07-06 ENCOUNTER — Institutional Professional Consult (permissible substitution): Admit: 2023-07-06 | Discharge: 2023-07-07 | Payer: PRIVATE HEALTH INSURANCE

## 2023-07-06 DIAGNOSIS — T148XXA Other injury of unspecified body region, initial encounter: Principal | ICD-10-CM

## 2023-07-06 DIAGNOSIS — C9210 Chronic myeloid leukemia, BCR/ABL-positive, not having achieved remission: Principal | ICD-10-CM

## 2023-07-06 LAB — COMPREHENSIVE METABOLIC PANEL
ALBUMIN: 4 g/dL (ref 3.4–5.0)
ALKALINE PHOSPHATASE: 180 U/L — ABNORMAL HIGH (ref 46–116)
ALT (SGPT): 13 U/L (ref 10–49)
ANION GAP: 10 mmol/L (ref 5–14)
AST (SGOT): 24 U/L (ref <=34)
BILIRUBIN TOTAL: 0.4 mg/dL (ref 0.3–1.2)
BLOOD UREA NITROGEN: 19 mg/dL (ref 9–23)
BUN / CREAT RATIO: 13
CALCIUM: 10.2 mg/dL (ref 8.7–10.4)
CHLORIDE: 98 mmol/L (ref 98–107)
CO2: 24.7 mmol/L (ref 20.0–31.0)
CREATININE: 1.48 mg/dL — ABNORMAL HIGH
EGFR CKD-EPI (2021) FEMALE: 47 mL/min/{1.73_m2} — ABNORMAL LOW (ref >=60)
GLUCOSE RANDOM: 272 mg/dL — ABNORMAL HIGH (ref 70–179)
POTASSIUM: 4 mmol/L (ref 3.4–4.8)
PROTEIN TOTAL: 8 g/dL (ref 5.7–8.2)
SODIUM: 133 mmol/L — ABNORMAL LOW (ref 135–145)

## 2023-07-06 LAB — CBC W/ AUTO DIFF
BASOPHILS ABSOLUTE COUNT: 0.1 10*9/L (ref 0.0–0.1)
BASOPHILS RELATIVE PERCENT: 0.8 %
EOSINOPHILS ABSOLUTE COUNT: 0.3 10*9/L (ref 0.0–0.5)
EOSINOPHILS RELATIVE PERCENT: 3.2 %
HEMATOCRIT: 39.2 % (ref 34.0–44.0)
HEMOGLOBIN: 12.8 g/dL (ref 11.3–14.9)
LYMPHOCYTES ABSOLUTE COUNT: 3.7 10*9/L — ABNORMAL HIGH (ref 1.1–3.6)
LYMPHOCYTES RELATIVE PERCENT: 37.6 %
MEAN CORPUSCULAR HEMOGLOBIN CONC: 32.7 g/dL (ref 32.0–36.0)
MEAN CORPUSCULAR HEMOGLOBIN: 28 pg (ref 25.9–32.4)
MEAN CORPUSCULAR VOLUME: 85.8 fL (ref 77.6–95.7)
MEAN PLATELET VOLUME: 8.1 fL (ref 6.8–10.7)
MONOCYTES ABSOLUTE COUNT: 0.6 10*9/L (ref 0.3–0.8)
MONOCYTES RELATIVE PERCENT: 5.7 %
NEUTROPHILS ABSOLUTE COUNT: 5.2 10*9/L (ref 1.8–7.8)
NEUTROPHILS RELATIVE PERCENT: 52.7 %
PLATELET COUNT: 317 10*9/L (ref 150–450)
RED BLOOD CELL COUNT: 4.57 10*12/L (ref 3.95–5.13)
RED CELL DISTRIBUTION WIDTH: 15.3 % — ABNORMAL HIGH (ref 12.2–15.2)
WBC ADJUSTED: 9.8 10*9/L (ref 3.6–11.2)

## 2023-07-06 NOTE — Unmapped (Addendum)
Warm compresses for now  As discussed, you may develop some pain and redness when the hematoma liquefies. Start alternating cold and warm compresses.  If noticing spreading redness, increased warmth, increased pain, follow up with PCP for recheck
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
## 2023-07-08 DIAGNOSIS — Z794 Long term (current) use of insulin: Principal | ICD-10-CM

## 2023-07-08 DIAGNOSIS — E1140 Type 2 diabetes mellitus with diabetic neuropathy, unspecified: Principal | ICD-10-CM

## 2023-07-08 DIAGNOSIS — N1831 Stage 3a chronic kidney disease (CMS-HCC): Principal | ICD-10-CM

## 2023-07-08 MED ORDER — DEXCOM G7 SENSOR DEVICE
11 refills | 0 days | Status: CP
Start: 2023-07-08 — End: ?

## 2023-07-08 NOTE — Unmapped (Signed)
Pt could not get the G6 app on her phone when she presented for education. So, I provided a G7 receiver and two sensors for G7. I have a pended G7 prescription for your signature if you approve.    Thanks

## 2023-07-13 NOTE — Unmapped (Signed)
The [facility] has made a second and final attempt to reach this patient to refill the following medication:Bosulif.      We have left voicemails on the following phone numbers: [phone], have sent a text message to the following phone numbers: [phone], and have sent a Mychart questionnaire..    Dates contacted: 8/15,21,26  Last scheduled delivery: 06/16/23    The patient may be at risk of non-compliance with this medication. The patient should call the [facility] at [phone]  Option 4, then Option 1: Oncology to refill medication.    Jenna Mosley Jenna Mosley   [facility] Specialty Technician

## 2023-07-17 ENCOUNTER — Ambulatory Visit: Admit: 2023-07-17 | Discharge: 2023-07-17 | Payer: PRIVATE HEALTH INSURANCE

## 2023-07-17 DIAGNOSIS — R809 Proteinuria, unspecified: Principal | ICD-10-CM

## 2023-07-17 DIAGNOSIS — E1122 Type 2 diabetes mellitus with diabetic chronic kidney disease: Principal | ICD-10-CM

## 2023-07-17 DIAGNOSIS — N1831 Type 2 diabetes mellitus with stage 3a chronic kidney disease, with long-term current use of insulin (CMS-HCC): Principal | ICD-10-CM

## 2023-07-17 DIAGNOSIS — Z794 Long term (current) use of insulin: Principal | ICD-10-CM

## 2023-07-17 LAB — BASIC METABOLIC PANEL
ANION GAP: 11 mmol/L (ref 5–14)
BLOOD UREA NITROGEN: 20 mg/dL (ref 9–23)
BUN / CREAT RATIO: 13
CALCIUM: 10.1 mg/dL (ref 8.7–10.4)
CHLORIDE: 100 mmol/L (ref 98–107)
CO2: 23.7 mmol/L (ref 20.0–31.0)
CREATININE: 1.52 mg/dL — ABNORMAL HIGH
EGFR CKD-EPI (2021) FEMALE: 46 mL/min/{1.73_m2} — ABNORMAL LOW (ref >=60)
GLUCOSE RANDOM: 196 mg/dL — ABNORMAL HIGH (ref 70–179)
POTASSIUM: 3.8 mmol/L (ref 3.4–4.8)
SODIUM: 135 mmol/L (ref 135–145)

## 2023-07-17 LAB — HEMOGLOBIN A1C
ESTIMATED AVERAGE GLUCOSE: 163 mg/dL
HEMOGLOBIN A1C: 7.3 % — ABNORMAL HIGH (ref 4.8–5.6)

## 2023-07-17 MED ORDER — MOUNJARO 2.5 MG/0.5 ML SUBCUTANEOUS PEN INJECTOR
SUBCUTANEOUS | 0 refills | 28 days | Status: CP
Start: 2023-07-17 — End: 2023-08-08

## 2023-07-17 NOTE — Unmapped (Signed)
[id number]. BS 255. Download Dexcom G7

## 2023-07-17 NOTE — Unmapped (Signed)
It was a pleasure seeing you today at our [facility].   If any testing was ordered today, most of the time the results will appear in your MyChart account even before I see them. Once all your results are available, I will contact you within 1-2 weeks with an explanation. Please contact me if you do not hear from me by then.   Please make sure you have access to MyChart since this is how I will communicate with you.  If you do not have MyChart, I will send you a letter which may take 1-2 weeks to arrive.      Remember to call us with any urgent issues. Please allow a few days for mychart messages to be read and answered.     If you are seen for diabetes, please bring your glucometer and all devices to every visit.   All refill requests must be submitted 14 days in advance to allow enough time to be processed.
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
## 2023-07-21 NOTE — Unmapped (Signed)
[facility] Refill Coordination Note    Specialty Medication(s) to be Shipped:   Hematology/Oncology: Bosulif    Other medication(s) to be shipped: No additional medications requested for fill at this time     Jenna Mosley, DOB: July 05, 1991  Phone: [phone] (home) [phone] (work)      All above HIPAA information was verified with patient.     Was a Nurse, learning disability used for this call? No    Completed refill call assessment today to schedule patient's medication shipment from the [facility] [phone]).  All relevant notes have been reviewed.     Specialty medication(s) and dose(s) confirmed: Regimen is correct and unchanged.   Changes to medications: Elibeth reports no changes at this time.  Changes to insurance: No  New side effects reported not previously addressed with a pharmacist or physician: None reported  Questions for the pharmacist: No    Confirmed patient received a Conservation officer, historic buildings and a Surveyor, mining with first shipment. The patient will receive a drug information handout for each medication shipped and additional FDA Medication Guides as required.       DISEASE/MEDICATION-SPECIFIC INFORMATION        N/A    SPECIALTY MEDICATION ADHERENCE     Medication Adherence    Patient reported X missed doses in the last month: 1  Specialty Medication: BOSULIF 500 mg Tab (bosutinib)  Patient is on additional specialty medications: No  Patient is on more than two specialty medications: No  Any gaps in refill history greater than 2 weeks in the last 3 months: no  Demonstrates understanding of importance of adherence: yes  Informant: patient  Confirmed plan for next specialty medication refill: delivery by pharmacy  Refills needed for supportive medications: not needed          Refill Coordination    Has the Patients' Contact Information Changed: No  Is the Shipping Address Different: No         Were doses missed due to medication being on hold? No    BOSULIF 500   mg: 10 days of medicine on hand       REFERRAL TO PHARMACIST     Referral to the pharmacist: Not needed      [facility]     Shipping address confirmed in Epic.       Delivery Scheduled: Yes, Expected medication delivery date: 07/23/2023.     Medication will be delivered via Next Day Courier to the prescription address in Epic WAM.    Kerby Less   [facility] Specialty Technician

## 2023-07-21 NOTE — Unmapped (Signed)
PA for [facility] 2.5mg  sent and pending.  Contact plan to follow up on [id number]

## 2023-07-22 ENCOUNTER — Institutional Professional Consult (permissible substitution): Admit: 2023-07-22 | Discharge: 2023-07-23 | Payer: PRIVATE HEALTH INSURANCE

## 2023-07-22 DIAGNOSIS — C9210 Chronic myeloid leukemia, BCR/ABL-positive, not having achieved remission: Principal | ICD-10-CM

## 2023-07-22 MED ADMIN — leuprolide (LUPRON) injection 11.25 mg: 11.25 mg | INTRAMUSCULAR | @ 17:00:00 | Stop: 2023-07-22

## 2023-07-22 NOTE — Unmapped (Signed)
Patient in clinic for lupron injection. Injection given per therapy plan orders with no issues noted while patient in clinic.

## 2023-07-23 DIAGNOSIS — C9210 Chronic myeloid leukemia, BCR/ABL-positive, not having achieved remission: Principal | ICD-10-CM

## 2023-07-23 DIAGNOSIS — Z6841 Body Mass Index (BMI) 40.0 and over, adult: Principal | ICD-10-CM

## 2023-07-23 MED FILL — BOSULIF 500 MG TABLET: 30 days supply | Qty: 30 | Fill #2

## 2023-07-27 NOTE — Unmapped (Signed)
Weight Management Services Financial Information Form    Hospital NPI: [id number] / Physicians NPI: [id number]   Ferd Glassing NPI: [id number]   [place] NPI: [id number]  Yetta Flock NPI: [id number]    MRN: [id number]  Patient: Jenna Mosley    Insurance Company: Medicaid  Telephone:  Policy/Subscriber ID: [id number] S     Subscriber Information (if different from patient)  Name: n/a  Date of Birth:  n/a     Is insurance in-network: Yes.     Is Bariatric Surgery Covered: Yes  Covered Surgeries:   43644: Laparoscopic roux-en-y gastric bypass   43775: Laparoscopic gastric sleeve     **Completion of this form does not guarantee coverage and is subject to change. The patient is instructed at the time of scheduling that they need to call their insurance company directly to confirm bariatric surgery coverage benefits. If the patient has questions about coinsurance, deductibles or out-of-pocket expenses they should call their insurance carrier or the financial counselor for upfront surgery cost questions at [phone]**    What requirements must be met for surgery to be approved:  Documented BMI > 35 for at least 12 months prior to requesting surgery w/ one co-morbidity. Patient must see surgeon before appointment with Bariatric Intake Clinic    Physician Supervised Diet? Yes How Long? 3 months  Does it have to be consecutive? Yes  Does it have to be done by PCP or can it be by a dietitian?  Either (RD if physician supervised)  How recent does it have to be? (past , , etc.) 1 year (but 3 consecutive months within year)  Do I need a weight history? (28yr, 34yrs, 63yrs) Yes  Do I need a clearance/referral letter from a doctor? Yes   If yes, does it have to come from PCP? Yes  Are nutrition consultations a covered benefit? Yes  (List next to visit type max number of visits, if applicable)  18841: Initial 1:1 nutrition counseling   [phone]: Follow up 1:1 nutrition counseling   97804: Group MNT nutrition counseling

## 2023-07-31 ENCOUNTER — Ambulatory Visit: Admit: 2023-07-31 | Discharge: 2023-07-31 | Payer: PRIVATE HEALTH INSURANCE

## 2023-07-31 ENCOUNTER — Ambulatory Visit
Admit: 2023-07-31 | Discharge: 2023-07-31 | Payer: PRIVATE HEALTH INSURANCE | Attending: Physician Assistant | Primary: Physician Assistant

## 2023-07-31 ENCOUNTER — Ambulatory Visit: Admit: 2023-07-31 | Discharge: 2023-08-01 | Payer: PRIVATE HEALTH INSURANCE

## 2023-07-31 DIAGNOSIS — Z0389 Encounter for observation for other suspected diseases and conditions ruled out: Principal | ICD-10-CM

## 2023-07-31 DIAGNOSIS — Z6841 Body Mass Index (BMI) 40.0 and over, adult: Principal | ICD-10-CM

## 2023-07-31 DIAGNOSIS — R339 Retention of urine, unspecified: Principal | ICD-10-CM

## 2023-07-31 LAB — LIPID PANEL
CHOLESTEROL/HDL RATIO SCREEN: 7.7 — ABNORMAL HIGH (ref 1.0–4.5)
CHOLESTEROL: 215 mg/dL — ABNORMAL HIGH (ref <=200)
HDL CHOLESTEROL: 28 mg/dL — ABNORMAL LOW (ref 40–60)
NON-HDL CHOLESTEROL: 187 mg/dL — ABNORMAL HIGH (ref 70–130)
TRIGLYCERIDES: 721 mg/dL — ABNORMAL HIGH (ref 0–150)

## 2023-07-31 LAB — IRON & TIBC
IRON SATURATION: 11 % — ABNORMAL LOW (ref 20–55)
IRON: 46 ug/dL — ABNORMAL LOW (ref 50–170)
TOTAL IRON BINDING CAPACITY: 408 ug/dL (ref 250–425)

## 2023-07-31 LAB — FERRITIN: FERRITIN: 68 ng/mL (ref 7.3–270.7)

## 2023-07-31 LAB — TOXICOLOGY SCREEN, URINE
AMPHETAMINE SCREEN URINE: NEGATIVE
BARBITURATE SCREEN URINE: NEGATIVE
BENZODIAZEPINE SCREEN, URINE: NEGATIVE
BUPRENORPHINE, URINE SCREEN: NEGATIVE
CANNABINOID SCREEN URINE: NEGATIVE
COCAINE(METAB.)SCREEN, URINE: NEGATIVE
FENTANYL SCREEN, URINE: NEGATIVE
METHADONE SCREEN, URINE: NEGATIVE
OPIATE SCREEN URINE: NEGATIVE
OXYCODONE SCREEN URINE: NEGATIVE
PCP SCREEN, URINE: NEGATIVE

## 2023-07-31 LAB — VITAMIN B12: VITAMIN B-12: 363 pg/mL (ref 211–911)

## 2023-07-31 LAB — LDL CHOLESTEROL, DIRECT: LDL CHOLESTEROL DIRECT: 89 mg/dL

## 2023-07-31 LAB — TSH: THYROID STIMULATING HORMONE: 2.253 u[IU]/mL (ref 0.550–4.780)

## 2023-07-31 LAB — FOLATE: FOLATE: 15 ng/mL

## 2023-07-31 NOTE — Unmapped (Signed)
[facility]    Assessment     Jenna Mosley is a 32 y.o. female and Body mass index is 48.15 kg/m??.  She presents with obesity contributing to multiple co morbidities and limiting her ability to be active.  Her goal is to improve her health.  Her weight gain history is suggestive of a strong genetic predisposition with early onset obesity and progressive weight gain. Additional causes of her obesity may include metabolic effects of consumption of processed food, irregular eating patterns, and stress/emotional eating.  Co-morbidities at this time include Esophageal Reflux, Hypertension, PCOS, Bipolar I Disorder, Anxiety, Depression, Schizoaffective Schizophrenia, PTSD, Type II Diabetes Mellitus, [name], NASH, Pulmonary Hypertension, Chronic Renal Impairment, Heart Failure with preserved ejection  [place] Obesity Staging Score is 3      Plan    1. Based on my assessment of the possible etiologies for the patient's obesity, medical co-morbidities, and review of systems my recommendations include the following:     Diet quality:   Diet is inadequate in protein, inadequate in vegetables, moderate in processed foods and low in SSB.     Patient was encouraged to focus on consuming lean protein, a variety of non starchy vegetables and fruit with each meal and snacks. Wean off carbonated and sugar sweetened beverages. Avoid consuming processed and fast foods; and avoid refined carbohydrates ( such as white bread, biscuits, white pasta, white rice, grits, crackers).     Dietary patterns and associated behaviors:  Meal pattern is inconsistent. Patient tends to skip meals.     We discussed the importance of having a  consistent meal pattern with three meals per day and 1-3 snacks. Avoid skipping meals or grazing. Avoid going longer than 4 hours without eating.     Patient asked to schedule meals and snacks every 3 hours.     Recommended Premier Protein, Ensure Max, Boost Max and Fair Life Core shakes as a meal replacement to avoid skipping meals. Or choose a protein shake with less than 200 calories, at least 20 grams of protein, less than 10 grams of total carbohydrates and 5 or less of total fat.         Exercise: PA not adequate for age/risk factors      She hopes to resume physical activity at least 3-4 times a week for 45 minutes which previously consisted of walking and weight training.        Sleep: Good sleep hygiene.  Recent sleep study on 02/04/2013 AHI = 2.9 (RDI 4.5).     Encouraged healthy sleep habits/sleep hygiene such as going to bed when sleepy, trying to get between 7-8 hours per night,  going to bed around the same time nightly and waking around the same time each morning.   Avoid screen time at least one hour before bed.  Avoid eating or drinking at least 2 hours before bed. If your mouth is dry, try small sips to moisten your mouth.   Try Yoga Nidra on YouTube.com (the lady in the blue shirt) to help with improving sleep, relaxation, releasing tension and stress.         Stress management: Stress level is currently moderate, feels that it is manageable, and no worrisome sx such as SI/HI, self harm, or worsening of previous psych diagnosis       Weight gain causing medications: Clonazepam, Hydroxyzine, Lamotrigine, Pregablin    Anti Obesity Medications: A few anti-obesity medications may be used for this patient including GLP1 agonists  or SGLT2 inhibitors pending insurance coverage.     Obesity Surgery: Patient is interested in surgery and does meet criteria for bariatric surgery.  Patient may move forward with bariatric intake appointment in the near future.      Labwork ordered: TSH, Vitamin B12, Vitamin D, Folate, Ferritin, Iron Panel, Lipid panel, Tox screen and Nicotine screen.    _____________________________________________________________________________________________________________________________    Subjective    Armond Hang Mcpeters was seen in consultation at the request of Tolley, Robyn J, AGNP for bariatric surgery and   comprehensive evaluation for the management of worsening obesity    CC/HPI:   Jenna Mosley is a 32 y.o. female and Body mass index is 48.15 kg/m??..    Motivations and goals  The following information was extracted from a questionnaire that was administered to the patient prior to setting up appointment.      05/25/2022    11:58 AM   Intake Questions   What is your main reason for obesity treatment? I am concered about my health   What treatments are you interested in pursuing? I am open to any option that will work   Overall goal - A.) I would like to get down to _____ lbs. B.) I do not have a weight goal. I mainly want to feel better B      Provider comments:    Weight History:      05/25/2022    11:58 AM   Weight History   What was your highest adult weight? (lbs) 515   What was your lowest adult weight on a diet or weight loss program? (lbs) 330   What was your lowest usual lowest adult weight? (lbs) 350   Please describe when and how you started gaining weight Jan. 2023, over eating due to anxiety.   Have you had any of the following? Multiple family members who struggle with their weight    Started gaining weight before the age of 67    Felling excessively hungry as a child   Did you notice that you gained weight during any of the following circumstances: working 2nd or 3rd shift, when you quit smoking, using some medications Various psych.medications. wt. ?   How much total weight did you gain with all your pregnancies that you were unable to lose after giving birth? 0   How much weight did you gain around the time you went through menopause? 0   Please name the diets that have worked for you in the past; how much weight you lost; how long did it take to come back? None      Provider comments: She is trying Keto now and lost 5 lbs so far.   Low carb nearly 30 lbs and maintaining.       Current medications with potential to cause weight gain:yes, Clonazepam, Hydroxyzine, Lamotrigine, Pregablin, Geodon    Dieting History:      05/25/2022    11:58 AM   Diet History   Are you currently working with a Registered Dietitian? No   Breakfast 4 chicken sandwiches and 2 diet dr. Alcus Dad   Morning Snack 0   Lunch 2 diet dr. Reino Kent   Afternoon Snack 1 diet dr. Reino Kent   Dinner Eat out, example [place] or seafood or  wendy's , always a diet soda   After [employer] Diet soda   Do you frequently feel hungry within 2 hours of having a regular size meal? Yes   How many times a  week? Daily   Do you eat at times when you are not hungry? No   Do you eat for comfort when you are stressed or emotional? No   Are there times when you eat and it feels like you can't stop yourself from eating? No   Do you try to manage your weight by vomiting, using laxatives, diuretics, or excessive exercise? No   Do you sometimes find food on your bed which you do not remember eating? No   Do you eat late at night? No   Please list foods that you eat frequently Cauliflower rice, cabbage, skinless chicken thighs, ground [place], peanut butter, banana, protein waffles, eggs.       Provider comments:   24 hour recall  Breakfast: Scrambled eggs and sausage  Lunch: 4 piece chicken nugget, Chili, Diet soda  Dinner: cabbage, ground [place], onions,   Two hamburger buns,Two snicker bars and a donut (due to low blood sugars all night)       Beverages: diet soda, sugar free water Hint, Gatorade zero, Minute Maid Zero.   Alcohol: denies  Eating out: 2 times a month.     Physical Activity:      05/25/2022    11:58 AM   Physical Activity History   How active are you at work? I mostly sit at a desk   Do you exercise regularly? Yes but recently stopped    Type of Exercise Walking on the treadmill and strength training   How many minutes do you exercise? 6-10 walking 30 minutes strength training   How many times do you exercise in a week? 3-4    If you do not exercise, it is because of? pain Which physical activity do you enjoy? Water aerobics   Are there certain actions or activities that you cannot do because of your weight? Yes      Provider comments: She hopes to resume exercise soon.     Effect on quality of life:  Activities limited by the weight: certain exercise, climbing stairs, bending, stooping squatting,running, prolonged walking or standing      Sleep:      05/25/2022    11:58 AM   Sleep History   How many hours do you sleep at night on average? 10   What time do you fall asleep? 10:00 PM   What time do you wake up without needing to go back to sleep?  1:00 PM   How many times do you wake up per night? 3   What time do you get the last drink of the day? 10:00 PM   Have you been diagnosed with Obstructive Sleep Apnea ([name])? Yes   Do you currently use a CPAP or other device for [name]? No   STOP-BANG Score Incomplete      Sleep study on 02/04/2013 AHI = 2.9 (RDI 4.5). She had [name] when she was around 500 lbs.     She feels tired during the day but attributes to [name].     Provider comments:    Stress:      05/25/2022    11:58 AM   Stress History   On a scale of 1 to 10, how stressed were you in the past year? 4   What do you do to manage your stress? Therapy, exercise, Listen to music, Watching DIY videos, Mindfulness.       Provider comments:  Effectiveness of these strategies:good      Previous use of anti-obesity medications:  05/25/2022    11:58 AM   Weight Loss Medication History   Have you ever used any of the medications from this list? Metformin    Victoza/Saxenda (Iiraglutide    Wellbutrin (Buproprion)    Jardiance (empagliflozin)    Farxiga (dapagliflozin)   Enter the amount of weight loss and side-effects for any medications selected in the previous question Varied, need to discuss medication        Mental Health History(emotional health):  Any thoughts about harming yourself  or engagement in any self harming behaviors e.g. cutting yourself in the past 18 months: no  Have you been to the ER or hospitalized for mental health reasons :yes, due to medication changes, feeling like she could not cope, suicide attempt or ideation . States most hospitalizations occurred as a teenager and young adult in 41s.   Any alcohol or substance abuse, including prescription abuse : no  Any suicidal attempts in the past: yes, as a teenager.   Any recreational drug use in the past 12 months:no  Any CBD products: no  Any passive exposures to tobacco or marijuana smoke:yes, brother smokes marijuana and tobacco in the car    ??  Patient Active Problem List   Diagnosis    Esophageal reflux    Essential (primary) hypertension    Obstructive sleep apnea    Polycystic ovarian syndrome    Bipolar I disorder: With psychotic features, Current or most recent episode depressed, with mixed features (CMS-HCC)    Anxiety disorder    Decreased hearing of both ears    Seborrheic dermatitis of scalp    Atypical chest pain    Pelvic mass    Therapeutic drug monitoring    H/O non anemic vitamin B12 deficiency    Schizo affective schizophrenia (CMS-HCC)    PTSD (post-traumatic stress disorder)    Class 3 severe obesity due to excess calories with serious comorbidity and body mass index (BMI) of 45.0 to 49.9 in adult (CMS-HCC)    Thyromegaly    Elevated platelet count    Type 2 diabetes mellitus with diabetic neuropathy, with long-term current use of insulin (CMS-HCC)    [name] (chronic myelocytic leukemia) (CMS-HCC)    NASH (nonalcoholic steatohepatitis)    Focal nodular hyperplasia of liver    Intestinal malabsorption    Drug-induced constipation    At risk for prolonged QT interval syndrome    Inappropriate diet and eating habits    Cow's milk intolerance    Gastroparesis    Insomnia    Prominent metatarsal head of left foot    Low vitamin D level    Primary stabbing headache    Restrictive airway disease    Brain atrophy (CMS-HCC)    Shortness of breath    Pulmonary hypertension (CMS-HCC)    Hyperkalemia    Chronic renal impairment, stage 2 (mild)    Allergic reaction to Augmentin    (HFpEF) heart failure with preserved ejection fraction (CMS-HCC)    Dog bite, hand, unspecified laterality, sequela       Current Outpatient Medications   Medication Sig Dispense Refill    acetaminophen (TYLENOL) 500 MG tablet Take 2 tablets (1,000 mg total) by mouth every six (6) hours as needed for pain. Extra strength tylenol      albuterol HFA 90 mcg/actuation inhaler Inhale 2 puffs every six (6) hours as needed. 8 g 0    bosutinib 500 mg Tab Take 1 tablet (500 mg total) by mouth once daily. Administer with  food. Swallow tablet whole; do not cut, crush, break, or chew. 30 tablet 2    calcium carbonate-vitamin D3 600 mg-20 mcg (800 unit) Tab Take 1 mg by mouth Two (2) times a day (at 8am and 12:00).      carvedilol (COREG) 6.25 MG tablet Take 1 tablet (6.25 mg total) by mouth in the morning and 1 tablet (6.25 mg total) in the evening. Take with meals. 60 tablet 6    cetirizine (ZYRTEC) 10 MG tablet TAKE 1 TABLET BY MOUTH IN THE MORNING. 90 tablet 3    cholecalciferol, vitamin D3, (VITAMIN D3 ORAL) Take by mouth.      clonazePAM (KLONOPIN) 0.5 MG tablet Take 1 tablet (0.5 mg total) by mouth daily as needed. PRN      cyclobenzaprine (FLEXERIL) 5 MG tablet Take 1 tablet (5 mg total) by mouth Three (3) times a day as needed (lower back pain). 90 tablet 1    dapagliflozin propanediol (FARXIGA) 10 mg Tab tablet Take 1 tablet (10 mg total) by mouth every morning. 90 tablet 3    diclofenac sodium (VOLTAREN) 1 % gel Apply 4 g topically four (4) times a day. 300 g 1    divalproex ER (DEPAKOTE ER) 500 MG extended released 24 hr tablet TAKE 1 TABLET BY MOUTH AT BEDTIME 90 tablet 3    ferrous sulfate 325 (65 FE) MG tablet Take 1 tablet (325 mg total) by mouth in the morning.      fluticasone propionate (FLONASE) 50 mcg/actuation nasal spray 2 sprays into each nostril daily. (Patient taking differently: 2 sprays into each nostril as needed.) 16 g 0    hydrOXYzine (VISTARIL) 50 MG capsule Take 2 capsules (100 mg total) by mouth nightly. And takes PRN      insulin regular hum U-500 conc (HUMULIN R U-500, CONC, KWIKPEN) 500 unit/mL (3 mL) CONCENTRATED injection Inject 150 Units under the skin Two (2) times a day (30 minutes before a meal). (Patient taking differently: Inject 150 Units under the skin Two (2) times a day (30 minutes before a meal). 160 units in the morning, 150 units at night) 54 mL 3    lamoTRIgine (LAMICTAL) 150 MG tablet Take 1 tablet (150 mg total) by mouth two (2) times a day.      leuprolide acetate (LUPRON DEPOT IM) Inject into the muscle. Every 3 months      metFORMIN (GLUCOPHAGE) 1000 MG tablet TAKE ONE TABLET BY MOUTH TWICE DAILY IN THE MORNING AND IN THE EVENING. TAKE WITH MEALS 180 tablet 3    norethindrone (AYGESTIN) 5 mg tablet TAKE ONE TABLET EVERY MORNING 30 tablet 11    pantoprazole (PROTONIX) 40 MG tablet TAKE 1 TABLET BY MOUTH IN THE MORNING. 90 tablet 3    pregabalin (LYRICA) 75 MG capsule TAKE 1 CAPSULE BY MOUTH IN THE MORNING AND 2 CAPSULES BY MOUTH IN THE EVENING. 270 capsule 0    torsemide (DEMADEX) 20 MG tablet TAKE FOUR TABLETS BY MOUTH EVERY DAY 120 tablet 6    traZODone (DESYREL) 100 MG tablet TAKE 2 TABLETS BY MOUTH AT BEDTIME 180 tablet 3    UNABLE TO FIND Take 1,000 mg by mouth in the morning. Med Name: vitamin D 1,000 mg daily    .      ziprasidone (GEODON) 80 MG capsule Take 1 capsule (80 mg total) by mouth in the morning and 1 capsule (80 mg total) in the evening. Take with meals.      alcohol swabs (ALCOHOL  PADS) PadM Apply 1 Swab topically Three (3) times a day before meals. 100 each 3    blood sugar diagnostic (ACCU-CHEK GUIDE TEST STRIPS) Strp Check sugars before meals three times for insulin dependent type two diabetes. E11.65 100 each 11    blood-glucose meter,continuous (DEXCOM G6 RECEIVER) Misc Dispense DexCom G6 Receiver 1 each 3    blood-glucose sensor (DEXCOM G6 SENSOR) Devi USE 1 SENSOR EVERY 10 DAYS AS DIRECTED 3 each 11 blood-glucose sensor (DEXCOM G7 SENSOR) Devi Change sensor every 10 days. 3 each 11    blood-glucose transmitter (DEXCOM G6 TRANSMITTER) Devi Use 1 every 90 days 1 each 3    lancets (ACCU-CHEK SOFTCLIX LANCETS) Misc Check sugar three times per day before meals for insulin dependent type two diabetes.  E11.65 100 each 11    TECHLITE PEN NEEDLE 32 gauge x 1/4 (6 mm) Ndle USE 3 TIMES DAILY AS DIRECTED 300 each 4    tirzepatide (MOUNJARO) 2.5 mg/0.5 mL PnIj Inject 0.5 mL (2.5 mg total) under the skin every seven (7) days for 4 doses. (Patient not taking: Reported on 07/31/2023) 2 mL 0    [START ON 08/14/2023] tirzepatide (MOUNJARO) 5 mg/0.5 mL PnIj Inject 5 mg under the skin every seven (7) days for 4 doses. 2 mL 0    [START ON 09/11/2023] tirzepatide (MOUNJARO) 7.5 mg/0.5 mL PnIj Inject 7.5 mg under the skin every seven (7) days for 4 doses. 2 mL 0     No current facility-administered medications for this visit.     Facility-Administered Medications Ordered in Other Visits   Medication Dose Route Frequency Provider Last Rate Last Admin    leuprolide (LUPRON) injection 11.25 mg  11.25 mg Intramuscular Once Coombs, Leta Speller, MD            Current medications with potential to cause weight gain:yes, Clonazepam, Hydroxyzine, Lamotrigine, Pregablin, Geodon    Allergies   Allergen Reactions    Augmentin [Amoxicillin-Pot Clavulanate] Angioedema     Rash and angioedema    Lisinopril Shortness Of Breath     Other Reaction(s): chest pain    SOB, chest painSOB, chest pain    Naproxen Nausea Only, Palpitations and Other (See Comments)     Chest palpitations and feels like flying    Aripiprazole Other (See Comments)     Inability to urinate    Other Reaction(s): cannot void    Ciprofloxacin Other (See Comments)     Does not work    Fluphenazine      mental health problems    Metoclopramide Other (See Comments)     Mania    ManiaMania    Prednisone Other (See Comments)     mania    Reglan [Metoclopramide Hcl] Other (See Comments)     Induces mania    Diphenhydramine Hcl Anxiety     Other Reaction(s): mania    Multihance [Gadobenate Dimeglumine] Nausea And Vomiting    Ondansetron Hcl Anxiety    Promethazine Anxiety       family history includes Alcohol abuse in her father; Anxiety disorder in her mother; Breast cancer in her maternal grandmother; Depression in her mother; Diabetes in her maternal grandfather, maternal uncle, mother, and paternal grandmother; Drug abuse in her father; Heart disease in her father; Hypertension in her maternal grandmother and mother; Macular degeneration in an other family member; Melanoma in her maternal grandmother; Parkinsonism in her maternal grandmother; Squamous cell carcinoma in her mother; Stroke in her maternal grandmother and another family member.  reports that she quit smoking about 10 years ago. Her smoking use included cigarettes. She started smoking about 20 years ago. She has a 10 pack-year smoking history. She has never used smokeless tobacco. She reports that she does not drink alcohol and does not use drugs.     Past Surgical History:   Procedure Laterality Date    COLONOSCOPY  2011    for diarrhea and rectal bleeding; hemorrhoids, otherwise normal with benign biopsies    LYMPHANGIOMA EXCISION      PR RIGHT HEART CATH O2 SATURATION & CARDIAC OUTPUT N/A 03/04/2023    Procedure: Right Heart Catheterization;  Surgeon: Autumn Messing, MD;  Location: [facility];  Service: Cardiology    PR UPPER GI ENDOSCOPY,BIOPSY N/A 10/24/2019    Procedure: UGI ENDOSCOPY; WITH BIOPSY, SINGLE OR MULTIPLE;  Surgeon: Scarlett Presto, MD;  [facility];  Service: Gastroenterology    REMOVAL OF IMPACTED TOOTH PARTIALLY BONY Right 07/16/2020    Procedure: REMOVAL OF IMPACTED TOOTH, PARTIALLY BONY;  Surgeon: Warren Danes, MD;  Location: MA[facility]  Service: Oral Maxillofacial    SKIN BIOPSY      SURGICAL REMOVAL Bilateral 07/16/2020    Procedure: SURGICAL REMOVAL ERUPTED TOOTH REQUIRING ELEVATION MUCOPERIOSTEAL FLAP/REMOVAL BONE &/OR SECTION OF TOOTH;  Surgeon: Warren Danes, MD;  Location: MA[facility];  Service: Oral Maxillofacial    TONSILLECTOMY      WISDOM TOOTH EXTRACTION          Patients previous medical records from [facility] reviewed and summarized in the Obesity Medicine Evaluation Summary            Previous and current use of anti-obesity medications:  Metformin:   yes, current  Phentermine: no  Topiramate:  no    Qysmia:      no  Bupropion:  yes, previously   Naltrexone:   no     Contrave:    no  Setmelanotide: no      ([place])  Liraglutide:  yes, previously      (Victoza, [place])  Semaglutide:  no     ([place], [place])  Tirzepatide:  yes, previously      (Mounjaro, Zepbound)  SGLT-2 inhibitor: no     ([place], [place], [place]...)  Orlistat :no     (Xenical, [place])  Other agents:  None known   Relevant History:  Migraines/HA: no  Seizures:   no    Palpitations:  yes, occassionally  Hypertension:  yes, controlled with medication    Glaucoma:  no  Kidney Stones:no  Gout:     no    Gallbladder Disease: no     Gallbladder Surgery:  no  Pancreatitis: no     Gallstone Pancreatitis: no    Personal or family history of Medullary Thyroid Cancer: no    For Women:   Irregular menstrual cycles: Yes, Takes Lupron every 3 months.   Excessive facial hair: No  Active Contraceptive use: yes, OCP        ROS:  Chest Discomfort:  no  Whooshing sound in ears:no   Blurry Vision:     no  Musculoskeletal pain:  no  Urinary incontinence:  no  Heartburn/Reflux:  yes, controlled with medication    Nausea:    no   Vomiting:    no   Dysphagia:    no    Abdominal pain:   no   Diarrhea:    no   Constipation:    no  Other systems reviewed were negative        OBJECTIVE:    Vital Signs:  BP 127/72 (BP Site: L Arm, BP Position: Sitting, BP Cuff Size: Large)  - Pulse 75  - Temp 36.7 ??C (98.1 ??F) (Oral)  - Ht 183.5 cm (6' 0.24)  - Wt (!) 162.1 kg (357 lb 6.4 oz)  - SpO2 98% - BMI 48.15 kg/m??    Wt Readings from Last 5 Encounters:   07/31/23 (!) 162.1 kg (357 lb 6.4 oz)   07/17/23 (!) 163.1 kg (359 lb 9.6 oz)   07/06/23 (!) 165.4 kg (364 lb 9.6 oz)   06/30/23 (!) 164.3 kg (362 lb 3.5 oz)   05/18/23 (!) 164.2 kg (362 lb)       Physical Exam:  Gen: alert and oriented, no acute distress  Body fat distribution Central adiposity      No supraclavicular adiposity.       No dorsal adiposity.      HEENT:      Ocular: non-icteric, non-injected, EOMI    Oral exam : Tongue moist, pink. moderateOP crowding.   NECK: full neck, no bruits   THYROID: normal size gland, non-tender, no discrete nodules palpated  RESP: CTA bilaterally   CV: RRR, no murmurs appreciated   Abdomen:  Benign, striae present        Moderate  pannus.   Musculoskeletal: Patient ambulatory without help  Extremities:  Peripheral edema trace  Skin exam:No acanthosis, No purplish striae. Intertrigo no    Pscyh: appropriate affect           Jenna Mosley has Esophageal reflux; Essential (primary) hypertension; Obstructive sleep apnea; Polycystic ovarian syndrome; Bipolar I disorder: With psychotic features, Current or most recent episode depressed, with mixed features (CMS-HCC); Anxiety disorder; Decreased hearing of both ears; Seborrheic dermatitis of scalp; Atypical chest pain; Pelvic mass; Therapeutic drug monitoring; H/O non anemic vitamin B12 deficiency; Schizo affective schizophrenia (CMS-HCC); PTSD (post-traumatic stress disorder); Class 3 severe obesity due to excess calories with serious comorbidity and body mass index (BMI) of 45.0 to 49.9 in adult (CMS-HCC); Thyromegaly; Elevated platelet count; Type 2 diabetes mellitus with diabetic neuropathy, with long-term current use of insulin (CMS-HCC); [name] (chronic myelocytic leukemia) (CMS-HCC); NASH (nonalcoholic steatohepatitis); Focal nodular hyperplasia of liver; Intestinal malabsorption; Drug-induced constipation; At risk for prolonged QT interval syndrome; Inappropriate diet and eating habits; Cow's milk intolerance; Gastroparesis; Insomnia; Prominent metatarsal head of left foot; Low vitamin D level; Primary stabbing headache; Restrictive airway disease; Brain atrophy (CMS-HCC); Shortness of breath; Pulmonary hypertension (CMS-HCC); Hyperkalemia; Chronic renal impairment, stage 2 (mild); Allergic reaction to Augmentin; (HFpEF) heart failure with preserved ejection fraction (CMS-HCC); and Dog bite, hand, unspecified laterality, sequela on their problem list.   Jenna Mosley  has a past surgical history that includes Tonsillectomy; Lymphangioma excision; Colonoscopy (2011); Skin biopsy; pr upper gi endoscopy,biopsy (N/A, 10/24/2019); removal of impacted tooth partially bony (Right, 07/16/2020); surgical removal (Bilateral, 07/16/2020); Wisdom tooth extraction; and pr right heart cath o2 saturation & cardiac output (N/A, 03/04/2023).         has a current medication list which includes the following prescription(s): acetaminophen, albuterol, bosutinib, calcium carbonate-vitamin d3, carvedilol, cetirizine, cholecalciferol (vitamin d3), clonazepam, cyclobenzaprine, dapagliflozin propanediol, diclofenac sodium, divalproex er, ferrous sulfate, fluticasone propionate, hydroxyzine, humulin r u-500 (conc) kwikpen, lamotrigine, leuprolide acetate, metformin, norethindrone, pantoprazole, pregabalin, torsemide, trazodone, UNABLE TO FIND, ziprasidone, alcohol swabs, accu-chek guide test strips, dexcom g6 receiver, dexcom g6 sensor, dexcom g7 sensor, dexcom g6 transmitter,  lancets, techlite pen needle, mounjaro, [START ON 08/14/2023] mounjaro, and [START ON 09/11/2023] mounjaro, and the following Facility-Administered Medications: leuprolide.     is allergic to augmentin [amoxicillin-pot clavulanate], lisinopril, naproxen, aripiprazole, ciprofloxacin, fluphenazine, metoclopramide, prednisone, reglan [metoclopramide hcl], diphenhydramine hcl, multihance [gadobenate dimeglumine], ondansetron hcl, and promethazine.       Results:   Lab work within last 6 months was unavailable and was ordered today        Visit duration  I personally spent 89 minutes face-to-face and non-face-to-face in the care of this patient, which includes all pre, intra, and post visit time on the date of service.    Note - This record has been created using [employer]. Chart creation errors have been sought, but may not always have been located. Such creation errors do not reflect on the standard of medical care.
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
## 2023-08-03 LAB — VITAMIN D 25 HYDROXY: VITAMIN D, TOTAL (25OH): 34.1 ng/mL (ref 20.0–80.0)

## 2023-08-06 LAB — NICOTINE SCREEN, URINE
ANABASINE, URINE: <2 ng/mL
COTININE, URINE: <5 ng/mL
NICOTINE, URINE: <5 ng/mL
NORNICOTINE, URINE: <2 ng/mL

## 2023-08-12 DIAGNOSIS — C9210 Chronic myeloid leukemia, BCR/ABL-positive, not having achieved remission: Principal | ICD-10-CM

## 2023-08-12 MED ORDER — BOSUTINIB 500 MG TABLET
ORAL_TABLET | 2 refills | 0 days | Status: CP
Start: 2023-08-12 — End: ?
  Filled 2023-08-25: qty 30, 30d supply, fill #0

## 2023-08-13 ENCOUNTER — Ambulatory Visit: Admit: 2023-08-13 | Discharge: 2023-08-14 | Payer: PRIVATE HEALTH INSURANCE

## 2023-08-13 NOTE — Unmapped (Unsigned)
[facility] Dupont  [address] - Phone: [phone]  [place], [place] [address] - Fax: [phone]   Date of Service: 08/13/2023    Assessment/Plan:   1. Chronic heart failure with preserved ejection fraction (CMS-HCC)  2. Pulmonary hypertension (CMS-HCC)  Symptoms stable, she is working on her exercise tolerance and weight which is great. Continue meds as below. No ACEi/ARB/ARNI/MRA due to intolerances/SEs in the past.  -torsemide 80 mg daily  -carvedilol 6.25 mg BID  -dapagliflozin 10 mg daily    Return to clinic: Return in about 3 months (around 11/12/2023).    I personally spent 35 minutes face-to-face and non-face-to-face in the care of this patient, which includes all pre, intra, and post visit time on the date of service.    Kathreen Devoid Verdis Frederickson, MD, [facility]  Assistant Professor of Medicine  Division of Cardiology  [facility]    Subjective:   ION:GEXBM, Mauricio Po, MD  Patient ID: Jenna Mosley is an 32 y.o. female patient with obesity, HTN, pulmonary HTN, and HFpEF, referred by Nurum Joeseph Amor, here for follow-up of HFpEF.    Interval History:  Patient was last seen in our clinic on 05/14/2023.  Since then she has generally done well without any ED visits or hospitalizations.  She is working on control of her diabetes, and her blood sugars have improved.  She is also lost about 15 pounds over the summer.  From a heart failure perspective, she is doing okay.  Working on exercise tolerance and going to the gym several times per week.  She is able to walk up to 10 minutes on the treadmill without stopping.  She does feel pretty wiped out after she does this.  Denies chest pain, dyspnea at rest, orthopnea, PND, palpitations, syncope/presyncope, or LE edema.  She is tolerating her medications without any issues.    Objective:   Physical Exam:  BP 131/75 (BP Site: L Arm, BP Position: Sitting, BP Cuff Size: Medium)  - Pulse 72  - Wt (!) 163.2 kg (359 lb 11.2 oz)  - SpO2 98%  - BMI 48.78 kg/m??   GEN: adult female patient in NAD  HEENT: NCAT, sclerae anicteric, OP clear  NECK: cannot appreciate JVP due to body habitus  CARD: RRR   S1/S2 audible   no murmurs, rubs, or gallops  RESP: CTAB, no wheezes, crackles, or ronchi, normal work of breathing  ABDO: soft, NT/ND  EXTREM: WWP, PPP, no edema  NEURO: AAO, CN II-XII grossly normal, moving all 4 extremities  SKIN: warm and dry    Notable results:  Labs:   Lab Results   Component Value Date    WBC 9.8 07/06/2023    HGB 12.8 07/06/2023    HCT 39.2 07/06/2023    PLT 317 07/06/2023     Lab Results   Component Value Date    NA 135 07/17/2023    K 3.8 07/17/2023    CREATININE 1.52 (H) 07/17/2023      Lab Results   Component Value Date    CHOL 215 (H) 07/31/2023    LDL  07/31/2023      Comment:      Unable to calculate due to triglyceride greater than 400 mg/dL.    LDL 89.0 07/31/2023    HDL 28 (L) 07/31/2023    TRIG 721 (H) 07/31/2023     Lab Results   Component Value Date    A1C 7.3 (H) 07/17/2023  Lab Results   Component Value Date    TROPONINI 8 04/18/2023    TROPONINI <0.300 10/24/2019    TROPONINI <0.034 08/04/2019        Imaging/Other: none new    The ASCVD Risk score (Arnett DK, et al., 2019) failed to calculate. mg/dL.    LDL 89.0 07/31/2023    HDL 28 (L) 07/31/2023    TRIG 721 (H) 07/31/2023     Lab Results   Component Value Date    A1C 7.3 (H) 07/17/2023      Lab Results   Component Value Date    TROPONINI 8 04/18/2023    TROPONINI <0.300 10/24/2019    TROPONINI <0.034 08/04/2019        Imaging/Other:  Electrocardiogram:  From 04/18/23 showed: Normal sinus rhythm.     Ziopatch:  From 5/31-6/07/24 showed: Patient had a min HR of 63 bpm, max HR of 126 bpm, and avg HR of 83 bpm. Predominant underlying rhythm was Sinus Rhythm. Junctional Rhythm was present. Junctional Rhythm was detected within +/- 45 seconds of symptomatic patient event(s). Isolated SVEs were rare (<1.0%), SVE Couplets were rare (<1.0%), and no SVE Triplets were present. No Isolated VEs, VE Couplets, or VE Triplets were present. There were 46 patient triggered events.  5 episodes corresponded to premature atrial contraction, 1 episode corresponded to labile of junctional rhythm although this may have just been ectopic atrial tachycardia with low voltage P wave in the lead.  The remaining episodes all corresponded normal sinus rhythm.     The ASCVD Risk score (Arnett DK, et al., 2019) failed to calculate.

## 2023-08-13 NOTE — Unmapped (Signed)
Patient here for follow up

## 2023-08-13 NOTE — Unmapped (Signed)
Continue current medications  Try holding morning carvedilol before exercise

## 2023-08-13 NOTE — Unmapped (Signed)
I have sent pt's application for a Handicap Parking placard to her My Chart as she requested.

## 2023-08-14 MED ORDER — MOUNJARO 5 MG/0.5 ML SUBCUTANEOUS PEN INJECTOR
SUBCUTANEOUS | 0 refills | 0 days | Status: CP
Start: 2023-08-14 — End: 2023-09-05

## 2023-08-18 ENCOUNTER — Emergency Department
Admit: 2023-08-18 | Discharge: 2023-08-19 | Disposition: A | Discharge status: Home / Self Care | Payer: PRIVATE HEALTH INSURANCE | Attending: Student in an Organized Health Care Education/Training Program

## 2023-08-18 ENCOUNTER — Ambulatory Visit
Admit: 2023-08-18 | Discharge: 2023-08-19 | Disposition: A | Discharge status: Home / Self Care | Payer: PRIVATE HEALTH INSURANCE | Attending: Student in an Organized Health Care Education/Training Program

## 2023-08-19 MED ORDER — METRONIDAZOLE 500 MG TABLET
ORAL_TABLET | Freq: Three times a day (TID) | ORAL | 0 refills | 7 days | Status: CP
Start: 2023-08-19 — End: 2023-08-26

## 2023-08-19 MED ORDER — DOXYCYCLINE HYCLATE 100 MG CAPSULE
ORAL_CAPSULE | Freq: Two times a day (BID) | ORAL | 0 refills | 7 days | Status: CP
Start: 2023-08-19 — End: 2023-08-26

## 2023-08-19 MED ADMIN — cephalexin (KEFLEX) capsule 500 mg: 500 mg | ORAL | @ 02:00:00 | Stop: 2023-08-18

## 2023-08-19 MED ADMIN — diph,pertuss(acel),tetanus vaccine-Tdap (BOOSTRIX) injection 0.5 mL: .5 mL | INTRAMUSCULAR | @ 03:00:00 | Stop: 2023-08-18

## 2023-08-19 MED ADMIN — lidocaine (PF) (XYLOCAINE-MPF) 10 mg/mL (1 %) injection 5 mL: 5 mL | @ 02:00:00 | Stop: 2023-08-18

## 2023-08-19 NOTE — Unmapped (Signed)
Pt states she was breaking up a dog fight between her dogs (who are vaccinated) and got bit on her R foot- pt has it wrapped up in an ace bandage
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
## 2023-08-20 NOTE — Unmapped (Signed)
[facility] Refill Coordination Note    Specialty Medication(s) to be Shipped:   Hematology/Oncology: Bosulif    Other medication(s) to be shipped: No additional medications requested for fill at this time     Jenna Mosley, DOB: 06-Jul-1991  Phone: [phone] (home) [phone] (work)      All above HIPAA information was verified with patient.     Was a Nurse, learning disability used for this call? No    Completed refill call assessment today to schedule patient's medication shipment from the [facility]  [phone]).  All relevant notes have been reviewed.     Specialty medication(s) and dose(s) confirmed: Regimen is correct and unchanged.   Changes to medications: Jenna Mosley reports no changes at this time.  Changes to insurance: No  New side effects reported not previously addressed with a pharmacist or physician: None reported  Questions for the pharmacist: No    Confirmed patient received a Conservation officer, historic buildings and a Surveyor, mining with first shipment. The patient will receive a drug information handout for each medication shipped and additional FDA Medication Guides as required.       DISEASE/MEDICATION-SPECIFIC INFORMATION        N/A    SPECIALTY MEDICATION ADHERENCE     Medication Adherence    Patient reported X missed doses in the last month: 0  Specialty Medication: Bosulif 500 mg  Patient is on additional specialty medications: No  Informant: patient              Were doses missed due to medication being on hold? No    Bosulif 500 mg: 8 days of medicine on hand     REFERRAL TO PHARMACIST     Referral to the pharmacist: Not needed      [facility]     Shipping address confirmed in Epic.       Delivery Scheduled: Yes, Expected medication delivery date: 08/25/23.     Medication will be delivered via Same Day Courier to the prescription address in Epic WAM.    Jenna Mosley   [facility]  Specialty Technician

## 2023-08-20 NOTE — Unmapped (Signed)
ED Progress Note    I received a call from pt's mother re: her prescriptions.  Patient's mother states she was supposed to have a prescription of Keflex sent to her pharmacy.  She states that she is at the pharmacy and there is no prescription there.  I reviewed the patient's chart, she had presented with dog bite to her hand.  Patient does have an Augmentin allergy.  I did not see an outpatient prescription for Keflex electronically sent in our system.  Patient's mother states that she had several stitches placed into the wound given how deep it was.  Given high concern for possible infection, particularly for Capnocytophaga, which is a common bacteria that is found in the oral cavity of a dog, pt will need anaerobic coverage as well. I will plan to start pt on doxycyline and flagyl as a second line therapy as she is allergic to augmentin.  I have confirmed with pt's mother that she has not had any known reactions to these medications.

## 2023-08-22 ENCOUNTER — Emergency Department
Admit: 2023-08-22 | Discharge: 2023-08-22 | Disposition: A | Discharge status: Home / Self Care | Payer: PRIVATE HEALTH INSURANCE | Attending: Family

## 2023-08-22 ENCOUNTER — Ambulatory Visit
Admit: 2023-08-22 | Discharge: 2023-08-22 | Disposition: A | Discharge status: Home / Self Care | Payer: PRIVATE HEALTH INSURANCE | Attending: Family

## 2023-08-22 DIAGNOSIS — L03115 Cellulitis of right lower limb: Principal | ICD-10-CM

## 2023-08-22 LAB — COMPREHENSIVE METABOLIC PANEL
ALBUMIN: 3.9 g/dL (ref 3.4–5.0)
ALKALINE PHOSPHATASE: 170 U/L — ABNORMAL HIGH (ref 46–116)
ALT (SGPT): 11 U/L (ref 10–49)
ANION GAP: 9 mmol/L (ref 5–14)
AST (SGOT): 26 U/L (ref <=34)
BILIRUBIN TOTAL: 0.3 mg/dL (ref 0.3–1.2)
BLOOD UREA NITROGEN: 26 mg/dL — ABNORMAL HIGH (ref 9–23)
BUN / CREAT RATIO: 23
CALCIUM: 10.3 mg/dL (ref 8.7–10.4)
CHLORIDE: 107 mmol/L (ref 98–107)
CO2: 23.3 mmol/L (ref 20.0–31.0)
CREATININE: 1.13 mg/dL — ABNORMAL HIGH
EGFR CKD-EPI (2021) FEMALE: 66 mL/min/{1.73_m2} (ref >=60)
GLUCOSE RANDOM: 148 mg/dL (ref 70–179)
POTASSIUM: 4.5 mmol/L (ref 3.4–4.8)
PROTEIN TOTAL: 8 g/dL (ref 5.7–8.2)
SODIUM: 139 mmol/L (ref 135–145)

## 2023-08-22 LAB — CBC W/ AUTO DIFF
BASOPHILS ABSOLUTE COUNT: 0.1 10*9/L (ref 0.0–0.1)
BASOPHILS RELATIVE PERCENT: 1.2 %
EOSINOPHILS ABSOLUTE COUNT: 0.2 10*9/L (ref 0.0–0.5)
EOSINOPHILS RELATIVE PERCENT: 2.3 %
HEMATOCRIT: 37.8 % (ref 34.0–44.0)
HEMOGLOBIN: 12.6 g/dL (ref 11.3–14.9)
LYMPHOCYTES ABSOLUTE COUNT: 3.1 10*9/L (ref 1.1–3.6)
LYMPHOCYTES RELATIVE PERCENT: 29.2 %
MEAN CORPUSCULAR HEMOGLOBIN CONC: 33.3 g/dL (ref 32.0–36.0)
MEAN CORPUSCULAR HEMOGLOBIN: 28.3 pg (ref 25.9–32.4)
MEAN CORPUSCULAR VOLUME: 85.1 fL (ref 77.6–95.7)
MEAN PLATELET VOLUME: 7.7 fL (ref 6.8–10.7)
MONOCYTES ABSOLUTE COUNT: 0.5 10*9/L (ref 0.3–0.8)
MONOCYTES RELATIVE PERCENT: 4.9 %
NEUTROPHILS ABSOLUTE COUNT: 6.7 10*9/L (ref 1.8–7.8)
NEUTROPHILS RELATIVE PERCENT: 62.4 %
NUCLEATED RED BLOOD CELLS: 0 /100{WBCs} (ref <=4)
PLATELET COUNT: 316 10*9/L (ref 150–450)
RED BLOOD CELL COUNT: 4.44 10*12/L (ref 3.95–5.13)
RED CELL DISTRIBUTION WIDTH: 14.9 % (ref 12.2–15.2)
WBC ADJUSTED: 10.7 10*9/L (ref 3.6–11.2)

## 2023-08-22 LAB — C-REACTIVE PROTEIN: C-REACTIVE PROTEIN: 48 mg/L — ABNORMAL HIGH (ref <=10.0)

## 2023-08-22 MED ADMIN — bacitracin ointment: TOPICAL | @ 18:00:00 | Stop: 2023-08-22

## 2023-08-22 MED ADMIN — vancomycin (VANCOCIN) 2000 mg in sodium chloride (NS) 0.9% 500 mL IVPB: 2000 mg | INTRAVENOUS | @ 18:00:00 | Stop: 2023-08-22
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
## 2023-08-22 NOTE — Unmapped (Signed)
The patient reports that she was seen here on 10/01 for a dog bite on her right foot. She states the wound was sutured, but the area is red, swollen, bleeding, and oozing. She mentions she has been taking the prescribed antibiotics and denies having any fever or chills.
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
## 2023-08-24 ENCOUNTER — Ambulatory Visit: Admit: 2023-08-24 | Discharge: 2023-08-24 | Payer: PRIVATE HEALTH INSURANCE

## 2023-08-24 ENCOUNTER — Institutional Professional Consult (permissible substitution): Admit: 2023-08-24 | Discharge: 2023-08-24 | Payer: PRIVATE HEALTH INSURANCE

## 2023-08-24 DIAGNOSIS — H6123 Impacted cerumen, bilateral: Principal | ICD-10-CM
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
## 2023-08-24 NOTE — Unmapped (Signed)
Nurse left a message to call the office to schedule a follow up visit with Dr. Chanetta Marshall.

## 2023-08-24 NOTE — Unmapped (Signed)
The patient is requesting for the Front Desk to contact them in regards to  ER follow up, needing appt next week Thursday or Friday in the AM, please advise.  Please contact The patient by Cell Phone    Routine callback turnaround time: 24-48 business hours. Programmer, systems Notified)

## 2023-08-27 NOTE — Unmapped (Signed)
Prior Authorization submitted for Mounjaro 5MG /0.5ML pen-injectors.    pending     (KeyMiki Kins)  PA Case ID #: [id number]  Rx #: [id number]

## 2023-09-02 NOTE — Unmapped (Signed)
Prior Authorization submitted for Mounjaro 5MG /0.5ML pen-injectors , via [employer] approved     Approved on October 10 by [facility] 2017  Your request has been approved  Authorization Expiration Date: 08/25/2024      (Key: [id number])  PA Case ID #: [id number]  Rx #: [id number]

## 2023-09-04 ENCOUNTER — Ambulatory Visit
Admit: 2023-09-04 | Discharge: 2023-09-04 | Disposition: A | Discharge status: Home / Self Care | Payer: PRIVATE HEALTH INSURANCE | Attending: Family

## 2023-09-04 DIAGNOSIS — L03115 Cellulitis of right lower limb: Principal | ICD-10-CM

## 2023-09-04 MED ORDER — DOXYCYCLINE HYCLATE 100 MG CAPSULE
ORAL_CAPSULE | Freq: Two times a day (BID) | ORAL | 0 refills | 7 days | Status: CP
Start: 2023-09-04 — End: 2023-09-11

## 2023-09-04 MED ORDER — CEPHALEXIN 500 MG CAPSULE
ORAL_CAPSULE | Freq: Three times a day (TID) | ORAL | 0 refills | 7 days | Status: CP
Start: 2023-09-04 — End: 2023-09-11

## 2023-09-04 NOTE — Unmapped (Signed)
Pt presents for removal of stitches to R foot.
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
## 2023-09-11 MED ORDER — MOUNJARO 7.5 MG/0.5 ML SUBCUTANEOUS PEN INJECTOR
SUBCUTANEOUS | 0 refills | 0 days | Status: CP
Start: 2023-09-11 — End: 2023-10-03

## 2023-09-18 ENCOUNTER — Ambulatory Visit
Admit: 2023-09-18 | Discharge: 2023-09-19 | Payer: PRIVATE HEALTH INSURANCE | Attending: Student in an Organized Health Care Education/Training Program | Primary: Student in an Organized Health Care Education/Training Program

## 2023-09-18 DIAGNOSIS — Z6841 Body Mass Index (BMI) 40.0 and over, adult: Principal | ICD-10-CM

## 2023-09-18 DIAGNOSIS — E66813 Class 3 severe obesity due to excess calories with serious comorbidity and body mass index (BMI) of 45.0 to 49.9 in adult (CMS-HCC): Principal | ICD-10-CM

## 2023-09-18 NOTE — Unmapped (Addendum)
Thank you for your interest in the Bariatric Program at [facility]. We are happy that you have made the first step at improving your health and look forward to working with you.          Our surgical path starts with an intake clinic appointment with a dietitian and bariatric nurse practitioner at our [facility] location to discuss the surgical program requirements.      Group Nutrition appointment, with dietitian follow-up 4 weeks after group appointment  Psychologist appointment, to help determine if you are emotionally prepared to make lifelong lifestyle changes  Additional follow up with the dietitian and/or nurse practitioner as needed; working to ensure that you have or will have the necessary support system around you as you proceed through treatments  Additional program visits will be scheduled as needed.    You will receive a call from our scheduler to be scheduled for your appointments.      IIf you have any questions regarding your visit today please feel free to contact me at the number listed below.     Eldridge Dace, RN, Bariatric Coordinator  Surgical RN Coordinator for Dr. Rowe Clack  [email or web address]  Phone:  [phone]  Fax:       [phone]
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
## 2023-09-21 DIAGNOSIS — I5032 Chronic diastolic (congestive) heart failure: Principal | ICD-10-CM

## 2023-09-21 MED ORDER — CARVEDILOL 6.25 MG TABLET
ORAL_TABLET | 6 refills | 0 days | Status: CP
Start: 2023-09-21 — End: ?

## 2023-09-24 NOTE — Unmapped (Signed)
[facility] Refill Coordination Note    Specialty Medication(s) to be Shipped:   Hematology/Oncology: Bosulif    Other medication(s) to be shipped: No additional medications requested for fill at this time     Jenna Mosley, DOB: 1991-09-17  Phone: [phone] (home) [phone] (work)      All above HIPAA information was verified with patient.     Was a Nurse, learning disability used for this call? No    Completed refill call assessment today to schedule patient's medication shipment from the [facility]  [phone]).  All relevant notes have been reviewed.     Specialty medication(s) and dose(s) confirmed: Regimen is correct and unchanged.   Changes to medications: Jenna Mosley reports no changes at this time.  Changes to insurance: No  New side effects reported not previously addressed with a pharmacist or physician: None reported  Questions for the pharmacist: No    Confirmed patient received a Conservation officer, historic buildings and a Surveyor, mining with first shipment. The patient will receive a drug information handout for each medication shipped and additional FDA Medication Guides as required.       DISEASE/MEDICATION-SPECIFIC INFORMATION        N/A    SPECIALTY MEDICATION ADHERENCE     Medication Adherence    Patient reported X missed doses in the last month: 0  Specialty Medication: Bosulif 500 mg  Patient is on additional specialty medications: No  Informant: patient              Were doses missed due to medication being on hold? No    Bosulif 500 mg: 7 days of medicine on hand     REFERRAL TO PHARMACIST     Referral to the pharmacist: Not needed      [facility]     Shipping address confirmed in Epic.       Delivery Scheduled: Yes, Expected medication delivery date: 11/12.     Medication will be delivered via UPS to the prescription address in Epic WAM.    Clydell Hakim, PharmD   [facility]  Specialty Pharmacist

## 2023-09-27 DIAGNOSIS — C9210 Chronic myeloid leukemia, BCR/ABL-positive, not having achieved remission: Principal | ICD-10-CM
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
## 2023-09-28 MED ORDER — PREGABALIN 75 MG CAPSULE
ORAL_CAPSULE | 0 refills | 0 days | Status: CP
Start: 2023-09-28 — End: ?

## 2023-09-28 MED FILL — BOSULIF 500 MG TABLET: 30 days supply | Qty: 30 | Fill #1

## 2023-10-01 ENCOUNTER — Ambulatory Visit
Admit: 2023-10-01 | Discharge: 2023-10-01 | Payer: PRIVATE HEALTH INSURANCE | Attending: Student in an Organized Health Care Education/Training Program | Primary: Student in an Organized Health Care Education/Training Program

## 2023-10-01 ENCOUNTER — Other Ambulatory Visit: Admit: 2023-10-01 | Discharge: 2023-10-01 | Payer: PRIVATE HEALTH INSURANCE

## 2023-10-01 DIAGNOSIS — C9210 Chronic myeloid leukemia, BCR/ABL-positive, not having achieved remission: Principal | ICD-10-CM

## 2023-10-01 DIAGNOSIS — R350 Frequency of micturition: Principal | ICD-10-CM

## 2023-10-01 LAB — COMPREHENSIVE METABOLIC PANEL
ALBUMIN: 3.8 g/dL (ref 3.4–5.0)
ALKALINE PHOSPHATASE: 206 U/L — ABNORMAL HIGH (ref 46–116)
ALT (SGPT): 14 U/L (ref 10–49)
ANION GAP: 7 mmol/L (ref 5–14)
AST (SGOT): 26 U/L (ref <=34)
BILIRUBIN TOTAL: 0.3 mg/dL (ref 0.3–1.2)
BLOOD UREA NITROGEN: 10 mg/dL (ref 9–23)
BUN / CREAT RATIO: 9
CALCIUM: 9.6 mg/dL (ref 8.7–10.4)
CHLORIDE: 106 mmol/L (ref 98–107)
CO2: 26 mmol/L (ref 20.0–31.0)
CREATININE: 1.07 mg/dL — ABNORMAL HIGH
EGFR CKD-EPI (2021) FEMALE: 70 mL/min/{1.73_m2} (ref >=60)
GLUCOSE RANDOM: 178 mg/dL (ref 70–179)
POTASSIUM: 3.8 mmol/L (ref 3.4–4.8)
PROTEIN TOTAL: 8.1 g/dL (ref 5.7–8.2)
SODIUM: 139 mmol/L (ref 135–145)

## 2023-10-01 LAB — CBC W/ AUTO DIFF
BASOPHILS ABSOLUTE COUNT: 0.1 10*9/L (ref 0.0–0.1)
BASOPHILS RELATIVE PERCENT: 1.4 %
EOSINOPHILS ABSOLUTE COUNT: 0.2 10*9/L (ref 0.0–0.5)
EOSINOPHILS RELATIVE PERCENT: 2.9 %
HEMATOCRIT: 38.7 % (ref 34.0–44.0)
HEMOGLOBIN: 12.8 g/dL (ref 11.3–14.9)
LYMPHOCYTES ABSOLUTE COUNT: 3.1 10*9/L (ref 1.1–3.6)
LYMPHOCYTES RELATIVE PERCENT: 38 %
MEAN CORPUSCULAR HEMOGLOBIN CONC: 33.2 g/dL (ref 32.0–36.0)
MEAN CORPUSCULAR HEMOGLOBIN: 27.8 pg (ref 25.9–32.4)
MEAN CORPUSCULAR VOLUME: 83.8 fL (ref 77.6–95.7)
MEAN PLATELET VOLUME: 8.3 fL (ref 6.8–10.7)
MONOCYTES ABSOLUTE COUNT: 0.5 10*9/L (ref 0.3–0.8)
MONOCYTES RELATIVE PERCENT: 5.8 %
NEUTROPHILS ABSOLUTE COUNT: 4.3 10*9/L (ref 1.8–7.8)
NEUTROPHILS RELATIVE PERCENT: 51.9 %
PLATELET COUNT: 303 10*9/L (ref 150–450)
RED BLOOD CELL COUNT: 4.62 10*12/L (ref 3.95–5.13)
RED CELL DISTRIBUTION WIDTH: 14.8 % (ref 12.2–15.2)
WBC ADJUSTED: 8.2 10*9/L (ref 3.6–11.2)

## 2023-10-01 LAB — URINALYSIS WITH MICROSCOPY WITH CULTURE REFLEX PERFORMABLE
BILIRUBIN UA: NEGATIVE
BLOOD UA: NEGATIVE
GLUCOSE UA: 500 — AB
KETONES UA: NEGATIVE
LEUKOCYTE ESTERASE UA: NEGATIVE
NITRITE UA: NEGATIVE
PH UA: 6.5 (ref 5.0–9.0)
PROTEIN UA: NEGATIVE
RBC UA: <1 /HPF (ref <=4)
SPECIFIC GRAVITY UA: 1.004 (ref 1.003–1.030)
SQUAMOUS EPITHELIAL: 1 /HPF (ref 0–5)
UROBILINOGEN UA: <2
WBC UA: 1 /HPF (ref 0–5)

## 2023-10-01 NOTE — Unmapped (Signed)
1351 COVID vaccine given IM to left deltoid slowly. Tolerated this well. Band-aid applied.

## 2023-10-01 NOTE — Unmapped (Signed)
It was great to see you today! Your blood counts look good and your kidney function is improving    I will update you on the results of your UA by the end of the day    Continue your Bosutinib daily    I will see you back in 3 months for a follow up     If you have questions or concerns, you may call the Nurse call line at [phone].    Office Visit on 10/01/2023   Component Date Value Ref Range Status    Color, UA 10/01/2023 Colorless   Final    Clarity, UA 10/01/2023 Clear   Final    Specific Gravity, UA 10/01/2023 1.004  1.003 - 1.030 Final    pH, UA 10/01/2023 6.5  5.0 - 9.0 Final    Leukocyte Esterase, UA 10/01/2023 Negative  Negative Final    Nitrite, UA 10/01/2023 Negative  Negative Final    Protein, UA 10/01/2023 Negative  Negative Final    Glucose, UA 10/01/2023 500 mg/dL (A)  Negative Final    Ketones, UA 10/01/2023 Negative  Negative Final    Urobilinogen, UA 10/01/2023 <2.0 mg/dL  <0.9 mg/dL Final    Bilirubin, UA 10/01/2023 Negative  Negative Final    Blood, UA 10/01/2023 Negative  Negative Final    RBC, UA 10/01/2023 <1  <=4 /HPF Final    WBC, UA 10/01/2023 1  0 - 5 /HPF Final    Squam Epithel, UA 10/01/2023 1  0 - 5 /HPF Final    Bacteria, UA 10/01/2023 Rare (A)  None Seen /HPF Final   Lab on 10/01/2023   Component Date Value Ref Range Status    Sodium 10/01/2023 139  135 - 145 mmol/L Final    Potassium 10/01/2023 3.8  3.4 - 4.8 mmol/L Final    Chloride 10/01/2023 106  98 - 107 mmol/L Final    CO2 10/01/2023 26.0  20.0 - 31.0 mmol/L Final    Anion Gap 10/01/2023 7  5 - 14 mmol/L Final    BUN 10/01/2023 10  9 - 23 mg/dL Final    Creatinine 81/19/1478 1.07 (H)  0.55 - 1.02 mg/dL Final    BUN/Creatinine Ratio 10/01/2023 9   Final    eGFR CKD-EPI (2021) Female 10/01/2023 70  >=60 mL/min/1.69m2 Final    eGFR calculated with CKD-EPI 2021 equation in accordance with [employer] and [employer] of Nephrology Task Force recommendations.    Glucose 10/01/2023 178  70 - 179 mg/dL Final Calcium 29/56/2130 9.6  8.7 - 10.4 mg/dL Final    Albumin 86/57/8469 3.8  3.4 - 5.0 g/dL Final    Total Protein 10/01/2023 8.1  5.7 - 8.2 g/dL Final    Total Bilirubin 10/01/2023 0.3  0.3 - 1.2 mg/dL Final    AST 62/95/2841 26  <=34 U/L Final    ALT 10/01/2023 14  10 - 49 U/L Final    Alkaline Phosphatase 10/01/2023 206 (H)  46 - 116 U/L Final    WBC 10/01/2023 8.2  3.6 - 11.2 10*9/L Final    RBC 10/01/2023 4.62  3.95 - 5.13 10*12/L Final    HGB 10/01/2023 12.8  11.3 - 14.9 g/dL Final    HCT 32/44/0102 38.7  34.0 - 44.0 % Final    MCV 10/01/2023 83.8  77.6 - 95.7 fL Final    MCH 10/01/2023 27.8  25.9 - 32.4 pg Final    MCHC 10/01/2023 33.2  32.0 - 36.0 g/dL Final  RDW 10/01/2023 14.8  12.2 - 15.2 % Final    MPV 10/01/2023 8.3  6.8 - 10.7 fL Final    Platelet 10/01/2023 303  150 - 450 10*9/L Final    Neutrophils % 10/01/2023 51.9  % Final    Lymphocytes % 10/01/2023 38.0  % Final    Monocytes % 10/01/2023 5.8  % Final    Eosinophils % 10/01/2023 2.9  % Final    Basophils % 10/01/2023 1.4  % Final    Absolute Neutrophils 10/01/2023 4.3  1.8 - 7.8 10*9/L Final    Absolute Lymphocytes 10/01/2023 3.1  1.1 - 3.6 10*9/L Final    Absolute Monocytes 10/01/2023 0.5  0.3 - 0.8 10*9/L Final    Absolute Eosinophils 10/01/2023 0.2  0.0 - 0.5 10*9/L Final    Absolute Basophils 10/01/2023 0.1  0.0 - 0.1 10*9/L Final

## 2023-10-08 DIAGNOSIS — C9210 Chronic myeloid leukemia, BCR/ABL-positive, not having achieved remission: Principal | ICD-10-CM

## 2023-10-13 DIAGNOSIS — C9210 Chronic myeloid leukemia, BCR/ABL-positive, not having achieved remission: Principal | ICD-10-CM

## 2023-10-20 ENCOUNTER — Institutional Professional Consult (permissible substitution): Admit: 2023-10-20 | Discharge: 2023-10-21 | Payer: PRIVATE HEALTH INSURANCE

## 2023-10-20 DIAGNOSIS — C9210 Chronic myeloid leukemia, BCR/ABL-positive, not having achieved remission: Principal | ICD-10-CM

## 2023-10-20 MED ADMIN — leuprolide (LUPRON) injection 11.25 mg: 11.25 mg | INTRAMUSCULAR | @ 17:00:00 | Stop: 2023-10-20

## 2023-10-20 NOTE — Unmapped (Signed)
Patient arrived to the clinic for lupron injection. Patient was administered IM injection per regimen order without complications while at clinic. Area covered with gauze and bandaid. Patient discharged home to self care.

## 2023-10-29 DIAGNOSIS — C9210 Chronic myeloid leukemia, BCR/ABL-positive, not having achieved remission: Principal | ICD-10-CM

## 2023-10-29 NOTE — Unmapped (Signed)
[facility] Refill Coordination Note    Specialty Medication(s) to be Shipped:   Hematology/Oncology: Bosulif    Other medication(s) to be shipped: No additional medications requested for fill at this time     Jenna Mosley, DOB: 19-Oct-1991  Phone: [phone] (home) [phone] (work)      All above HIPAA information was verified with patient.     Was a Nurse, learning disability used for this call? No    Completed refill call assessment today to schedule patient's medication shipment from the [facility]  [phone]).  All relevant notes have been reviewed.     Specialty medication(s) and dose(s) confirmed: Regimen is correct and unchanged.   Changes to medications: [name] reports no changes at this time.  Changes to insurance: No  New side effects reported not previously addressed with a pharmacist or physician: None reported  Questions for the pharmacist: No    Confirmed patient received a Conservation officer, historic buildings and a Surveyor, mining with first shipment. The patient will receive a drug information handout for each medication shipped and additional FDA Medication Guides as required.       DISEASE/MEDICATION-SPECIFIC INFORMATION        N/A    SPECIALTY MEDICATION ADHERENCE     Medication Adherence    Patient reported X missed doses in the last month: 1  Specialty Medication: Bosulif 500 mg  Patient is on additional specialty medications: No  Patient is on more than two specialty medications: No  Any gaps in refill history greater than 2 weeks in the last 3 months: no  Demonstrates understanding of importance of adherence: yes  Informant: patient              Were doses missed due to medication being on hold? No    BOSULIF 500  mg: 5 days of medicine on hand       REFERRAL TO PHARMACIST     Referral to the pharmacist: Not needed      [facility]     Shipping address confirmed in Epic.       Delivery Scheduled: Yes, Expected medication delivery date: 11/02/23. Medication will be delivered via Same Day Courier to the prescription address in Epic WAM.    Moshe Salisbury   [facility]  Specialty Technician

## 2023-10-30 ENCOUNTER — Ambulatory Visit: Admit: 2023-10-30 | Discharge: 2023-10-31 | Payer: PRIVATE HEALTH INSURANCE

## 2023-10-30 DIAGNOSIS — R809 Proteinuria, unspecified: Principal | ICD-10-CM

## 2023-10-30 DIAGNOSIS — E1140 Type 2 diabetes mellitus with diabetic neuropathy, unspecified: Principal | ICD-10-CM

## 2023-10-30 DIAGNOSIS — Z794 Long term (current) use of insulin: Principal | ICD-10-CM

## 2023-10-30 DIAGNOSIS — E66813 Class 3 obesity: Principal | ICD-10-CM

## 2023-10-30 MED ORDER — HUMULIN R U-500 (CONC) INSULIN KWIKPEN 500 UNIT/ML (3 ML) SUBCUTANEOUS
Freq: Every day | SUBCUTANEOUS | 3 refills | 100.00 days | Status: CP
Start: 2023-10-30 — End: ?

## 2023-10-30 MED ORDER — DEXCOM G7 SENSOR DEVICE
11 refills | 0.00 days | Status: CP
Start: 2023-10-30 — End: ?
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
## 2023-10-30 NOTE — Unmapped (Cosign Needed)
Dexcom downloaded.  BG done today.  A1C done today.  PP: 2:26 pm  BG: 205 mg/dL

## 2023-11-02 MED FILL — BOSULIF 500 MG TABLET: 30 days supply | Qty: 30 | Fill #2

## 2023-11-03 ENCOUNTER — Ambulatory Visit
Admit: 2023-11-03 | Discharge: 2023-11-04 | Disposition: A | Discharge status: Home / Self Care | Payer: PRIVATE HEALTH INSURANCE | Attending: Student in an Organized Health Care Education/Training Program

## 2023-11-03 ENCOUNTER — Emergency Department
Admit: 2023-11-03 | Discharge: 2023-11-04 | Disposition: A | Discharge status: Home / Self Care | Payer: PRIVATE HEALTH INSURANCE | Attending: Student in an Organized Health Care Education/Training Program

## 2023-11-03 LAB — COMPREHENSIVE METABOLIC PANEL
ALBUMIN: 3.7 g/dL (ref 3.4–5.0)
ALKALINE PHOSPHATASE: 217 U/L — ABNORMAL HIGH (ref 46–116)
ALT (SGPT): 24 U/L (ref 10–49)
ANION GAP: 13 mmol/L (ref 5–14)
AST (SGOT): 30 U/L (ref <=34)
BILIRUBIN TOTAL: 0.4 mg/dL (ref 0.3–1.2)
BLOOD UREA NITROGEN: 18 mg/dL (ref 9–23)
BUN / CREAT RATIO: 14
CALCIUM: 10 mg/dL (ref 8.7–10.4)
CHLORIDE: 96 mmol/L — ABNORMAL LOW (ref 98–107)
CO2: 31.7 mmol/L — ABNORMAL HIGH (ref 20.0–31.0)
CREATININE: 1.28 mg/dL — ABNORMAL HIGH (ref 0.55–1.02)
EGFR CKD-EPI (2021) FEMALE: 56 mL/min/{1.73_m2} — ABNORMAL LOW (ref >=60)
GLUCOSE RANDOM: 72 mg/dL (ref 70–179)
POTASSIUM: 3.9 mmol/L (ref 3.4–4.8)
PROTEIN TOTAL: 7.6 g/dL (ref 5.7–8.2)
SODIUM: 141 mmol/L (ref 135–145)

## 2023-11-03 LAB — CBC W/ AUTO DIFF
BASOPHILS ABSOLUTE COUNT: 0 10*9/L (ref 0.0–0.1)
BASOPHILS RELATIVE PERCENT: 0.2 %
EOSINOPHILS ABSOLUTE COUNT: 0.3 10*9/L (ref 0.0–0.5)
EOSINOPHILS RELATIVE PERCENT: 2.7 %
HEMATOCRIT: 40 % (ref 34.0–44.0)
HEMOGLOBIN: 13.5 g/dL (ref 11.3–14.9)
LYMPHOCYTES ABSOLUTE COUNT: 4 10*9/L — ABNORMAL HIGH (ref 1.1–3.6)
LYMPHOCYTES RELATIVE PERCENT: 41.4 %
MEAN CORPUSCULAR HEMOGLOBIN CONC: 33.6 g/dL (ref 32.0–36.0)
MEAN CORPUSCULAR HEMOGLOBIN: 28 pg (ref 25.9–32.4)
MEAN CORPUSCULAR VOLUME: 83.3 fL (ref 77.6–95.7)
MEAN PLATELET VOLUME: 8 fL (ref 6.8–10.7)
MONOCYTES ABSOLUTE COUNT: 0.7 10*9/L (ref 0.3–0.8)
MONOCYTES RELATIVE PERCENT: 7.6 %
NEUTROPHILS ABSOLUTE COUNT: 4.7 10*9/L (ref 1.8–7.8)
NEUTROPHILS RELATIVE PERCENT: 48.1 %
NUCLEATED RED BLOOD CELLS: 0 /100{WBCs} (ref <=4)
PLATELET COUNT: 270 10*9/L (ref 150–450)
RED BLOOD CELL COUNT: 4.81 10*12/L (ref 3.95–5.13)
RED CELL DISTRIBUTION WIDTH: 14.5 % (ref 12.2–15.2)
WBC ADJUSTED: 9.7 10*9/L (ref 3.6–11.2)

## 2023-11-03 LAB — LIPASE: LIPASE: 35 U/L (ref 12–53)

## 2023-11-03 LAB — PREGNANCY, URINE: PREGNANCY TEST URINE: NEGATIVE

## 2023-11-03 NOTE — Unmapped (Signed)
Prior Authorization for [employer] Pending

## 2023-11-04 LAB — URINALYSIS WITH MICROSCOPY WITH CULTURE REFLEX PERFORMABLE
BACTERIA: NONE SEEN /HPF
BILIRUBIN UA: NEGATIVE
BLOOD UA: NEGATIVE
KETONES UA: NEGATIVE
LEUKOCYTE ESTERASE UA: NEGATIVE
NITRITE UA: NEGATIVE
PH UA: 5.5 (ref 5.0–9.0)
PROTEIN UA: NEGATIVE
RBC UA: <1 /HPF (ref <=4)
SPECIFIC GRAVITY UA: 1.004 (ref 1.003–1.030)
SQUAMOUS EPITHELIAL: <1 /HPF (ref 0–5)
UROBILINOGEN UA: <2
WBC UA: 1 /HPF (ref 0–5)

## 2023-11-04 LAB — MAGNESIUM: MAGNESIUM: 1.9 mg/dL (ref 1.6–2.6)

## 2023-11-04 LAB — TSH: THYROID STIMULATING HORMONE: 0.895 u[IU]/mL (ref 0.550–4.780)

## 2023-11-04 MED ORDER — ONDANSETRON 4 MG DISINTEGRATING TABLET
ORAL_TABLET | Freq: Three times a day (TID) | ORAL | 0 refills | 5.00 days | Status: CP | PRN
Start: 2023-11-04 — End: 2023-11-11

## 2023-11-04 MED ADMIN — ondansetron (ZOFRAN) injection 4 mg: 4 mg | INTRAVENOUS | @ 08:00:00 | Stop: 2023-11-04

## 2023-11-04 MED ADMIN — iohexol (OMNIPAQUE) 350 mg iodine/mL solution 100 mL: 100 mL | INTRAVENOUS | @ 09:00:00 | Stop: 2023-11-04

## 2023-11-04 MED ADMIN — ibuprofen (MOTRIN) tablet 800 mg: 800 mg | ORAL | @ 06:00:00 | Stop: 2023-11-04

## 2023-11-04 MED ADMIN — ondansetron (ZOFRAN-ODT) disintegrating tablet 4 mg: 4 mg | ORAL | @ 06:00:00 | Stop: 2023-11-04

## 2023-11-04 NOTE — Unmapped (Signed)
Nurse returned mom's phone call. Jenna Mosley was seen in the ER last night.  Patient's mom would like an appointment in our office today if possible.  There are no available appointments in our office today or tomorrow at this time. Patient has been experiencing symptoms since she received her Covid vaccine on 10/01/2023.  Symptoms include dizziness, headaches (not a migraine but will have black tunnel vision), nauseated, fatigued, sleeping a lot, and weight loss (15lbs).  Patient is hardly walking, cannot eat or sit up.  Mom states this is not depression. Patient seen by her psychologist.  Thought she may have been dehydrated so patient had 80 oz of water yesterday. CT scan of head and abdomen were done at the ER and both were normal per mom. Nurse scheduled patient a hospital follow up appointment with Dr. Theadora Rama for December 20th.  Nurse advised to continue to monitor symptoms and if worsening, to please take patient back to the ER. Mom verbalized understanding.  Please advise.

## 2023-11-04 NOTE — Unmapped (Signed)
Pt states she saw her doctor on Friday where she takes weekly mounjaro and had the dose increased 3 weeks ago and has not been eating and drinking as much, has been having dizziness, HA, nausea and they are concerned for dehydration.
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
## 2023-11-06 ENCOUNTER — Ambulatory Visit
Admit: 2023-11-06 | Discharge: 2023-11-07 | Payer: PRIVATE HEALTH INSURANCE | Attending: Internal Medicine | Primary: Internal Medicine

## 2023-11-06 NOTE — Unmapped (Signed)
Patient ID: Jenna Mosley is a 32 y.o. female who presents for follow up of ER    Informant: Patient is accompanied by mother.    Assessment/Plan:      1. Dizziness (Primary)  2. Drug-induced constipation  3. Chronic renal impairment, stage 2 (mild)  4. Class 3 severe obesity due to excess calories with serious comorbidity and body mass index (BMI) of 45.0 to 49.9 in adult (CMS-HCC)  Reviewed recent ER visit, head and abdominal imaging, and labs. Describes dizziness/lightheadedness ongoing for 3-4 weeks at least, with nausea and then constipation starting 1-2 weeks ago. Seems to be correlated with last increase of Mounjaro dose to 7.5mg . she has lost 15 lbs within 4 weeks. Her recent Cr was up slightly, suggesting mild dehydration. Feels better today after having a large BM finally yesterday after doubling up on Miralax. CT showed no signs of bowel obstruction. Recommended she hold off on increasing Mounjaro dose next week as planned and perhaps stay on 7.5mg  a few more weeks to allow body to adjust. Can continue low dose of Miralax such as half capful every 2-3 days to stay ahead of constipation, holding for diarrhea.         Preventive services addressed today  We did not review preventive services today    -- Patient verbalized an understanding of today's assessment and recommendations, as well as the purpose of ongoing medications.    No follow-ups on file.    Medication adherence and barriers to the treatment plan have been addressed. Opportunities to optimize healthy behaviors have been discussed. Patient / caregiver voiced understanding.        Subjective:     HPI  Here for ER follow up. Went to ER as she had been feeling dizzy and lightheaded for a few weeks and it had just been getting worse. Could barely get up and move around. Was nauseated. Was getting increasingly constipated, though dizziness started before the constipation. Before ER, pushed herself to drink 80 oz of water. Did get labs and imaging in ER which were fine. Did not receive any IV fluids due to shortage but she has been able to continue PO fluids. For constipation was taking one Miralax dose daily and then mom increased her to double dose daily. Was pushing and straining so hard she thinks she developed an anal fissure. Then yesterday, had a very large BM and since then has felt like a new woman and much better.     ROS  As per HPI.    Outpatient Medications Prior to Visit   Medication Sig Dispense Refill    acetaminophen (TYLENOL) 500 MG tablet Take 2 tablets (1,000 mg total) by mouth every six (6) hours as needed for pain. Extra strength tylenol      albuterol HFA 90 mcg/actuation inhaler Inhale 2 puffs every six (6) hours as needed. 8 g 0    alcohol swabs (ALCOHOL PADS) PadM Apply 1 Swab topically Three (3) times a day before meals. 100 each 3    blood sugar diagnostic (ACCU-CHEK GUIDE TEST STRIPS) Strp Check sugars before meals three times for insulin dependent type two diabetes. E11.65 100 each 11    blood-glucose sensor (DEXCOM G7 SENSOR) Devi Change sensor every 10 days. 3 each 11    bosutinib 500 mg Tab Take 1 tablet (500 mg total) by mouth once daily. Administer with food. Swallow tablet whole; do not cut, crush, break, or chew. 30 tablet 2    calcium carbonate-vitamin D3  600 mg-20 mcg (800 unit) Tab Take 1 mg by mouth Two (2) times a day (at 8am and 12:00).      carvedilol (COREG) 6.25 MG tablet TAKE ONE TABLET BY MOUTH EVERY MORNING AND ONE EVERY EVENING WITH MEALS 60 tablet 6    cetirizine (ZYRTEC) 10 MG tablet TAKE 1 TABLET BY MOUTH IN THE MORNING. 90 tablet 3    cholecalciferol, vitamin D3, (VITAMIN D3 ORAL) Take by mouth.      clonazePAM (KLONOPIN) 0.5 MG tablet Take 1 tablet (0.5 mg total) by mouth daily as needed. PRN      cyclobenzaprine (FLEXERIL) 5 MG tablet Take 1 tablet (5 mg total) by mouth Three (3) times a day as needed (lower back pain). 90 tablet 1    dapagliflozin propanediol (FARXIGA) 10 mg Tab tablet Take 1 tablet (10 mg total) by mouth every morning. 90 tablet 3    divalproex ER (DEPAKOTE ER) 500 MG extended released 24 hr tablet TAKE 1 TABLET BY MOUTH AT BEDTIME 90 tablet 3    ferrous sulfate 325 (65 FE) MG tablet Take 1 tablet (325 mg total) by mouth in the morning.      fluticasone propionate (FLONASE) 50 mcg/actuation nasal spray 2 sprays into each nostril daily. (Patient taking differently: 2 sprays into each nostril as needed.) 16 g 0    hydrOXYzine (VISTARIL) 50 MG capsule Take 2 capsules (100 mg total) by mouth nightly. And takes PRN (Patient taking differently: Take 4 capsules (200 mg total) by mouth nightly. And takes PRN)      insulin regular hum U-500 conc (HUMULIN R U-500, CONC, KWIKPEN) 500 unit/mL (3 mL) CONCENTRATED injection Inject 150 Units under the skin in the morning. 30 mL 3    lamoTRIgine (LAMICTAL) 150 MG tablet Take 1 tablet (150 mg total) by mouth two (2) times a day.      lancets (ACCU-CHEK SOFTCLIX LANCETS) Misc Check sugar three times per day before meals for insulin dependent type two diabetes.  E11.65 100 each 11    leuprolide acetate (LUPRON DEPOT IM) Inject into the muscle. Every 3 months      metFORMIN (GLUCOPHAGE) 1000 MG tablet TAKE ONE TABLET BY MOUTH TWICE DAILY IN THE MORNING AND IN THE EVENING. TAKE WITH MEALS 180 tablet 3    norethindrone (AYGESTIN) 5 mg tablet TAKE ONE TABLET EVERY MORNING 30 tablet 11    ondansetron (ZOFRAN-ODT) 4 MG disintegrating tablet Take 1 tablet (4 mg total) by mouth every eight (8) hours as needed for nausea for up to 7 days. 14 tablet 0    pantoprazole (PROTONIX) 40 MG tablet TAKE 1 TABLET BY MOUTH IN THE MORNING. 90 tablet 3    pregabalin (LYRICA) 75 MG capsule TAKE 1 CAPSULE BY MOUTH IN THE MORNING AND 2 CAPSULES BY MOUTH IN THE EVENING. 270 capsule 0    TECHLITE PEN NEEDLE 32 gauge x 1/4 (6 mm) Ndle USE 3 TIMES DAILY AS DIRECTED 300 each 4    [START ON 01/22/2024] tirzepatide (MOUNJARO) 10 mg/0.5 mL PnIj Inject 10 mg under the skin every seven (7) days for 4 doses. 2 mL 0    torsemide (DEMADEX) 20 MG tablet TAKE FOUR TABLETS BY MOUTH EVERY DAY 120 tablet 6    traZODone (DESYREL) 100 MG tablet TAKE 2 TABLETS BY MOUTH AT BEDTIME 180 tablet 3    ziprasidone (GEODON) 80 MG capsule Take 1 capsule (80 mg total) by mouth in the morning and 1 capsule (80 mg total) in the  evening. Take with meals.       Facility-Administered Medications Prior to Visit   Medication Dose Route Frequency Provider Last Rate Last Admin    leuprolide (LUPRON) injection 11.25 mg  11.25 mg Intramuscular Once Coombs, Leta Speller, MD           The following portions of the patient's history were reviewed and updated as appropriate: allergies, current medications, past medical history, past social history, and problem list.          Objective:       Vital Signs  BP 122/82 (BP Site: L Arm, BP Position: Sitting, BP Cuff Size: Medium)  - Pulse 78  - Resp 20  - Wt (!) 155.9 kg (343 lb 11.2 oz)  - SpO2 98%  - BMI 46.61 kg/m??      Exam  Physical Exam  Constitutional:       Appearance: Normal appearance. She is obese.   HENT:      Right Ear: Tympanic membrane normal.      Left Ear: Tympanic membrane normal.   Cardiovascular:      Rate and Rhythm: Normal rate and regular rhythm.      Heart sounds: Normal heart sounds.   Pulmonary:      Effort: Pulmonary effort is normal. No respiratory distress.      Breath sounds: Normal breath sounds.   Neurological:      Mental Status: She is alert.

## 2023-11-09 MED ORDER — MOUNJARO 7.5 MG/0.5 ML SUBCUTANEOUS PEN INJECTOR
0 refills | 0.00 days
Start: 2023-11-09 — End: ?

## 2023-11-10 MED ORDER — MOUNJARO 7.5 MG/0.5 ML SUBCUTANEOUS PEN INJECTOR
0 refills | 0.00 days
Start: 2023-11-10 — End: ?

## 2023-11-11 DIAGNOSIS — C9210 Chronic myeloid leukemia, BCR/ABL-positive, not having achieved remission: Principal | ICD-10-CM

## 2023-11-13 NOTE — Unmapped (Signed)
Called patient back regarding mychart message and symptoms.  She is currently taking Humulin U500 160u at 10am with breakfast.  Having lows 10pm to MN.   Still on mounjaro 7.5mg .  Also noting lightheadedness on standing.  Only feels good with laying down. Still drinking plenty of water and eating ok, but having to eat extra sugar to avoid lows.      For last 7 days, Time in range was over 75%, lows 1%    I reviewed her recent BMP with increased bicarb and Cr suggesting hypovolemia..    Plan:   Reduce humulin N to 130u daily in AM   Reduce farxiga to 5mg   daily.      (Also routing to cardiology given this adjustment).

## 2023-11-20 NOTE — Unmapped (Signed)
Patient ID: Jenna Mosley is a 33 y.o. female who presents for new concerns of dizziness with the headaches.  Informant: Patient is accompanied by mom.  Assessment/Plan:   Vitamin D deficiency  -     Vitamin D 25 Hydroxy (25OH D2 + D3)  -     Parathyroid Hormone (PTH); Future    Rapid weight loss  -     Vitamin B12 Level  -     Folate Level  -     TSH  -     Comprehensive Metabolic Panel  -     Magnesium Level  -     Zinc Level, Serum; Future  -     Copper, Serum; Future  -     Vitamin B1, Whole Blood; Future  -     Ferritin  -     Iron & TIBC  -     Parathyroid Hormone (PTH); Future    Type 2 diabetes mellitus with diabetic neuropathy, with long-term current use of insulin (CMS-HCC)  Assessment & Plan:    Diabetes is under much better control however concerned that [facility] may be causing some of patient's symptoms.  Will check bariatrics labs to assess patient's nutritional status.  She has had a dramatic weight loss within the past few months on Mounjaro.  Consider decreasing dose to 5.0 weekly.  Additionally patient is on insulin (dose decreased by endocrine).  Metformin and Farxiga.  Prescription written for ondansetron for nausea.  Patient is okay taking MiraLAX daily for constipation.  Patient is due for eye exam.    Orders:  -     Albumin/creatinine urine ratio    Acute cystitis without hematuria  -     Discontinue: nitrofurantoin monohyd/m-cryst; Take 1 capsule (100 mg total) by mouth two (2) times a day for 7 days.  -     Urine Culture  -     nitrofurantoin monohyd/m-cryst; Take 1 capsule (100 mg total) by mouth two (2) times a day for 7 days.  -     POCT urinalysis dipstick    Onycholysis of toenail  Assessment & Plan:  Referred to podiatry for evaluation.  Patient is diabetic and is at high risk for diabetic foot infection.  Would like the nail which is nearly off the nailbed completely to be managed.  Also make recommendations for treatment of onychomycosis.    Orders:  -     Ambulatory Referral to Podiatry; Future    Bipolar I disorder: With psychotic features, Current or most recent episode depressed, with mixed features (CMS-HCC)  Assessment & Plan:  Still marked depressive symptoms however patient feels that overall she is doing much better.  Seems like she is getting along well with her mother.  Continue current management.  Patient has follow-up with psychiatry and therapist.- Continued home meds hydroxyzine, lamictal, depakote, trazodone, geodon, lyrica.       Class 3 severe obesity due to excess calories with serious comorbidity and body mass index (BMI) of 45.0 to 49.9 in adult (CMS-HCC)  Assessment & Plan:  Weight markedly improved with Mounjaro.  Continue working independently to reduce calories through portion control and careful food selection. Continue following up with weight management clinic. Recommend taking brisk walks in the mornings or evenings.  Will check nutritional labs given patient's rapid weight loss.      [name] (chronic myelocytic leukemia) (CMS-HCC)  Assessment & Plan:    Management as per heme-onc.  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 She started  imatinib on 03/27/2021 but her BCR-ABL did not show improvement after 3 months and remained >10%. She switched to bosutinib 500 mg daily on 07/12/21 and has since achieved MMR.  Patient stable on current regimen.  Takes Basutinib 500 daily.          Low vitamin D level  Assessment & Plan:  Continue vitamin d supplmentation.  Will check vitamin D level today.      Schizo affective schizophrenia (CMS-HCC)  Assessment & Plan:  The condition is stable based upon today's assessment.  Under treatment or active surveillance by appropriate specialist.         Heart failure with preserved ejection fraction, unspecified HF chronicity (CMS-HCC)  Assessment & Plan:  The condition is stable based upon today's assessment.  Under treatment or active surveillance by appropriate specialist.         Pulmonary hypertension (CMS-HCC)  Assessment & Plan:  The condition is stable based upon today's assessment.  Under treatment or active surveillance by appropriate specialist.         Brain atrophy (CMS-HCC)  Assessment & Plan:  Noted on scan of brain.  Referred to neurology.  Do not see that scheduled.  Will address with patient at follow-up.         Other orders  -     ondansetron; Take 1 tablet (4 mg total) by mouth every eight (8) hours as needed for nausea.       Preventive services addressed today  We did not review preventive services today  -- Patient verbalized an understanding of today's assessment and recommendations, as well as the purpose of ongoing medications.  Return in about 4 weeks (around 12/22/2023).     Subjective:   HPI  Dizziness fatigue headache.  Some improvement with cutting back on Mounjaro.  Describes tiredness such that patient is going to bed at 7 and sleeping for over 12 hours.  No chest pain or shortness of breath.  Palpitations are being assessed by cardiology and no change.  Main medication adjustment has been titration upwards on Mounjaro.  Patient has lost quite a bit of weight proximately 45 pounds.  Appetite is very low.  Patient describes constipation such that she is now taking MiraLAX daily.  No fevers chills or sweats.  No other symptoms.  Of note patient's diabetes regimen has been adjusted with regard to decreasing insulin.  ROS  A comprehensive review of systems was conducted with negative results except as noted in the HPI above.          Objective:   Vital Signs  BP 108/70 (BP Site: L Arm, BP Position: Sitting, BP Cuff Size: Large)  - Pulse 84  - Wt (!) 156.1 kg (344 lb 1.6 oz)  - SpO2 95%  - Breastfeeding No  - BMI 46.67 kg/m??  Body mass index is 46.67 kg/m??.  Exam  Physical Exam  Vitals and nursing note reviewed.   Constitutional:       Appearance: Normal appearance.   HENT:      Head: Normocephalic and atraumatic.   Cardiovascular:      Rate and Rhythm: Normal rate and regular rhythm.   Skin:     General: Skin is warm.   Neurological: General: No focal deficit present.      Mental Status: She is alert. Mental status is at baseline.   Psychiatric:         Mood and Affect: Mood normal.  Behavior: Behavior normal.         Thought Content: Thought content normal.      Foot Exam Performed: Brief Foot Exam (Monofilament)   Monofilament Test: (!) 0 of 8 sites normal  Skin: (!) Callus  Nails: (!) Other (Comment) (Right great toenail is coming off)  Foot Deformities: None       I've personally reviewed and summarized records in EPIC/Media and via CareEverywhere as well as medication, allergies, past medical, social, and family history.   I have reviewed the patient's medical history in detail and updated the computerized patient record.    Note - This chart has been prepared using the Dragon voice recognition system. Typographical errors may have occurred.  Attempts have been made to correct errors, however, inadvertent errors may persist.

## 2023-11-24 DIAGNOSIS — Z6841 Body Mass Index (BMI) 40.0 and over, adult: Principal | ICD-10-CM

## 2023-11-24 DIAGNOSIS — E1140 Type 2 diabetes mellitus with diabetic neuropathy, unspecified: Principal | ICD-10-CM

## 2023-11-24 DIAGNOSIS — L601 Onycholysis: Principal | ICD-10-CM

## 2023-11-24 DIAGNOSIS — C9210 Chronic myeloid leukemia, BCR/ABL-positive, not having achieved remission: Principal | ICD-10-CM

## 2023-11-24 DIAGNOSIS — F315 Bipolar disorder, current episode depressed, severe, with psychotic features: Principal | ICD-10-CM

## 2023-11-24 DIAGNOSIS — Z794 Long term (current) use of insulin: Principal | ICD-10-CM

## 2023-11-24 DIAGNOSIS — G319 Degenerative disease of nervous system, unspecified: Principal | ICD-10-CM

## 2023-11-24 DIAGNOSIS — F259 Schizoaffective disorder, unspecified: Principal | ICD-10-CM

## 2023-11-24 DIAGNOSIS — R634 Abnormal weight loss: Principal | ICD-10-CM

## 2023-11-24 DIAGNOSIS — I272 Pulmonary hypertension, unspecified: Principal | ICD-10-CM

## 2023-11-24 DIAGNOSIS — R7989 Other specified abnormal findings of blood chemistry: Principal | ICD-10-CM

## 2023-11-24 DIAGNOSIS — N3000 Acute cystitis without hematuria: Principal | ICD-10-CM

## 2023-11-24 DIAGNOSIS — E559 Vitamin D deficiency, unspecified: Principal | ICD-10-CM

## 2023-11-24 DIAGNOSIS — I5030 Unspecified diastolic (congestive) heart failure: Principal | ICD-10-CM

## 2023-11-24 DIAGNOSIS — E66813 Class 3 severe obesity due to excess calories with serious comorbidity and body mass index (BMI) of 45.0 to 49.9 in adult (CMS-HCC): Principal | ICD-10-CM

## 2023-11-24 LAB — CBC W/ AUTO DIFF
BASOPHILS ABSOLUTE COUNT: 0.1 10*9/L (ref 0.0–0.1)
BASOPHILS RELATIVE PERCENT: 0.8 %
EOSINOPHILS ABSOLUTE COUNT: 0.1 10*9/L (ref 0.0–0.5)
EOSINOPHILS RELATIVE PERCENT: 0.5 %
HEMATOCRIT: 46.6 % — ABNORMAL HIGH (ref 34.0–44.0)
HEMOGLOBIN: 15.5 g/dL — ABNORMAL HIGH (ref 11.3–14.9)
LYMPHOCYTES ABSOLUTE COUNT: 3.3 10*9/L (ref 1.1–3.6)
LYMPHOCYTES RELATIVE PERCENT: 20.7 %
MEAN CORPUSCULAR HEMOGLOBIN CONC: 33.3 g/dL (ref 32.0–36.0)
MEAN CORPUSCULAR HEMOGLOBIN: 28.3 pg (ref 25.9–32.4)
MEAN CORPUSCULAR VOLUME: 84.8 fL (ref 77.6–95.7)
MEAN PLATELET VOLUME: 8.3 fL (ref 6.8–10.7)
MONOCYTES ABSOLUTE COUNT: 0.5 10*9/L (ref 0.3–0.8)
MONOCYTES RELATIVE PERCENT: 3.4 %
NEUTROPHILS ABSOLUTE COUNT: 11.9 10*9/L — ABNORMAL HIGH (ref 1.8–7.8)
NEUTROPHILS RELATIVE PERCENT: 74.6 %
NUCLEATED RED BLOOD CELLS: 0 /100{WBCs} (ref <=4)
PLATELET COUNT: 348 10*9/L (ref 150–450)
RED BLOOD CELL COUNT: 5.49 10*12/L — ABNORMAL HIGH (ref 3.95–5.13)
RED CELL DISTRIBUTION WIDTH: 15.4 % — ABNORMAL HIGH (ref 12.2–15.2)
WBC ADJUSTED: 16 10*9/L — ABNORMAL HIGH (ref 3.6–11.2)

## 2023-11-24 LAB — COMPREHENSIVE METABOLIC PANEL
ALBUMIN: 4.3 g/dL (ref 3.4–5.0)
ALKALINE PHOSPHATASE: 241 U/L — ABNORMAL HIGH (ref 46–116)
ALT (SGPT): 30 U/L (ref 10–49)
ANION GAP: 18 mmol/L — ABNORMAL HIGH (ref 5–14)
AST (SGOT): 33 U/L (ref <=34)
BILIRUBIN TOTAL: 0.3 mg/dL (ref 0.3–1.2)
BLOOD UREA NITROGEN: 30 mg/dL — ABNORMAL HIGH (ref 9–23)
BUN / CREAT RATIO: 20
CALCIUM: 10.5 mg/dL — ABNORMAL HIGH (ref 8.7–10.4)
CHLORIDE: 91 mmol/L — ABNORMAL LOW (ref 98–107)
CO2: 29 mmol/L (ref 20.0–31.0)
CREATININE: 1.53 mg/dL — ABNORMAL HIGH (ref 0.55–1.02)
EGFR CKD-EPI (2021) FEMALE: 46 mL/min/{1.73_m2} — ABNORMAL LOW (ref >=60)
GLUCOSE RANDOM: 197 mg/dL — ABNORMAL HIGH (ref 70–179)
POTASSIUM: 3.7 mmol/L (ref 3.4–4.8)
PROTEIN TOTAL: 8.6 g/dL — ABNORMAL HIGH (ref 5.7–8.2)
SODIUM: 138 mmol/L (ref 135–145)

## 2023-11-24 LAB — ALBUMIN / CREATININE URINE RATIO
ALBUMIN QUANT URINE: <0.3 mg/dL
CREATININE, URINE: 11.7 mg/dL

## 2023-11-24 LAB — LIPASE: LIPASE: 43 U/L (ref 12–53)

## 2023-11-24 LAB — HCG QUANTITATIVE, BLOOD: GONADOTROPIN, CHORIONIC (HCG) QUANT: <2.6 m[IU]/mL

## 2023-11-24 LAB — PRO-BNP: PRO-BNP: <35 pg/mL (ref <=300.0)

## 2023-11-24 MED ORDER — ONDANSETRON 4 MG DISINTEGRATING TABLET
ORAL_TABLET | Freq: Three times a day (TID) | ORAL | 1 refills | 10.00 days | Status: CP | PRN
Start: 2023-11-24 — End: ?

## 2023-11-24 MED ORDER — NITROFURANTOIN MONOHYDRATE/MACROCRYSTALS 100 MG CAPSULE
ORAL_CAPSULE | Freq: Two times a day (BID) | ORAL | 0 refills | 7.00 days | Status: CP
Start: 2023-11-24 — End: 2023-12-01

## 2023-11-25 ENCOUNTER — Ambulatory Visit
Admit: 2023-11-25 | Discharge: 2023-11-25 | Disposition: A | Discharge status: Home / Self Care | Payer: PRIVATE HEALTH INSURANCE | Primary: Geriatric Medicine

## 2023-11-25 ENCOUNTER — Emergency Department
Admit: 2023-11-25 | Discharge: 2023-11-25 | Disposition: A | Discharge status: Home / Self Care | Payer: PRIVATE HEALTH INSURANCE

## 2023-11-25 LAB — URINALYSIS WITH MICROSCOPY WITH CULTURE REFLEX PERFORMABLE
BILIRUBIN UA: NEGATIVE
BLOOD UA: NEGATIVE
GLUCOSE UA: 300 — AB
HYALINE CASTS: 9 /LPF — ABNORMAL HIGH (ref 0–1)
KETONES UA: NEGATIVE
NITRITE UA: NEGATIVE
PH UA: 5 (ref 5.0–9.0)
RBC UA: 3 /HPF (ref <=4)
SPECIFIC GRAVITY UA: 1.016 (ref 1.003–1.030)
SQUAMOUS EPITHELIAL: 1 /HPF (ref 0–5)
UROBILINOGEN UA: <2
WBC UA: 7 /HPF — ABNORMAL HIGH (ref 0–5)

## 2023-11-25 MED ORDER — SULFAMETHOXAZOLE 800 MG-TRIMETHOPRIM 160 MG TABLET
ORAL_TABLET | Freq: Two times a day (BID) | ORAL | 0 refills | 10.00 days | Status: CP
Start: 2023-11-25 — End: 2023-12-05

## 2023-11-25 MED ADMIN — cefTRIAXone (ROCEPHIN) 2 g in sodium chloride 0.9 % (NS) 100 mL IVPB-MBP: 2 g | INTRAVENOUS | @ 07:00:00 | Stop: 2023-11-25

## 2023-11-25 MED ADMIN — ondansetron (ZOFRAN) injection 4 mg: 4 mg | INTRAVENOUS | @ 03:00:00 | Stop: 2023-11-24

## 2023-11-25 MED ADMIN — clonazePAM (KlonoPIN) disintegrating tablet 0.5 mg: .5 mg | ORAL | @ 06:00:00 | Stop: 2023-11-25

## 2023-11-25 MED ADMIN — oxyCODONE (ROXICODONE) immediate release tablet 5 mg: 5 mg | ORAL | @ 04:00:00 | Stop: 2023-11-24

## 2023-11-25 MED ADMIN — lactated ringers bolus 1,000 mL: 1000 mL | INTRAVENOUS | @ 04:00:00 | Stop: 2023-11-24

## 2023-11-25 NOTE — Unmapped (Signed)
Diabetes is under much better control however concerned that [facility] may be causing some of patient's symptoms.  Will check bariatrics labs to assess patient's nutritional status.  She has had a dramatic weight loss within the past few months on Mounjaro.  Consider decreasing dose to 5.0 weekly.  Additionally patient is on insulin (dose decreased by endocrine).  Metformin and Farxiga.  Prescription written for ondansetron for nausea.  Patient is okay taking MiraLAX daily for constipation.  Patient is due for eye exam.

## 2023-11-25 NOTE — Unmapped (Signed)
Management as per heme-onc.  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 She started imatinib on 03/27/2021 but her BCR-ABL did not show improvement after 3 months and remained >10%. She switched to bosutinib 500 mg daily on 07/12/21 and has since achieved MMR.  Patient stable on current regimen.  Takes Basutinib 500 daily.

## 2023-11-25 NOTE — Unmapped (Signed)
Weight markedly improved with Mounjaro.  Continue working independently to reduce calories through portion control and careful food selection. Continue following up with weight management clinic. Recommend taking brisk walks in the mornings or evenings.  Will check nutritional labs given patient's rapid weight loss.

## 2023-11-25 NOTE — Unmapped (Signed)
Referred to podiatry for evaluation.  Patient is diabetic and is at high risk for diabetic foot infection.  Would like the nail which is nearly off the nailbed completely to be managed.  Also make recommendations for treatment of onychomycosis.

## 2023-11-25 NOTE — Unmapped (Signed)
Pt endorsing emesis and a lump in her throat with SOB and chills since this afternoon.

## 2023-11-25 NOTE — Unmapped (Signed)
 The condition is stable based upon today's assessment.  Under treatment or active surveillance by appropriate specialist.

## 2023-11-25 NOTE — Unmapped (Signed)
Continue vitamin d supplmentation.  Will check vitamin D level today.
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
## 2023-11-25 NOTE — Unmapped (Signed)
Noted on scan of brain.  Referred to neurology.  Do not see that scheduled.  Will address with patient at follow-up.

## 2023-11-25 NOTE — Unmapped (Signed)
Still marked depressive symptoms however patient feels that overall she is doing much better.  Seems like she is getting along well with her mother.  Continue current management.  Patient has follow-up with psychiatry and therapist.- Continued home meds hydroxyzine, lamictal, depakote, trazodone, geodon, lyrica.

## 2023-11-26 ENCOUNTER — Ambulatory Visit: Admit: 2023-11-26 | Discharge: 2023-11-27 | Payer: PRIVATE HEALTH INSURANCE | Attending: Family | Primary: Family

## 2023-11-26 ENCOUNTER — Ambulatory Visit: Admit: 2023-11-26 | Discharge: 2023-11-27 | Payer: PRIVATE HEALTH INSURANCE

## 2023-11-26 LAB — FERRITIN: FERRITIN: 104.3 ng/mL (ref 7.3–270.7)

## 2023-11-26 LAB — CBC W/ AUTO DIFF
BASOPHILS ABSOLUTE COUNT: 0.1 10*9/L (ref 0.0–0.1)
BASOPHILS RELATIVE PERCENT: 1.2 %
EOSINOPHILS ABSOLUTE COUNT: 0.3 10*9/L (ref 0.0–0.5)
EOSINOPHILS RELATIVE PERCENT: 3.4 %
HEMATOCRIT: 40.7 % (ref 34.0–44.0)
HEMOGLOBIN: 13.6 g/dL (ref 11.3–14.9)
LYMPHOCYTES ABSOLUTE COUNT: 3.3 10*9/L (ref 1.1–3.6)
LYMPHOCYTES RELATIVE PERCENT: 38.3 %
MEAN CORPUSCULAR HEMOGLOBIN CONC: 33.5 g/dL (ref 32.0–36.0)
MEAN CORPUSCULAR HEMOGLOBIN: 28.5 pg (ref 25.9–32.4)
MEAN CORPUSCULAR VOLUME: 85.1 fL (ref 77.6–95.7)
MEAN PLATELET VOLUME: 8.2 fL (ref 6.8–10.7)
MONOCYTES ABSOLUTE COUNT: 0.5 10*9/L (ref 0.3–0.8)
MONOCYTES RELATIVE PERCENT: 5.9 %
NEUTROPHILS ABSOLUTE COUNT: 4.5 10*9/L (ref 1.8–7.8)
NEUTROPHILS RELATIVE PERCENT: 51.2 %
NUCLEATED RED BLOOD CELLS: 0 /100{WBCs} (ref <=4)
PLATELET COUNT: 262 10*9/L (ref 150–450)
RED BLOOD CELL COUNT: 4.78 10*12/L (ref 3.95–5.13)
RED CELL DISTRIBUTION WIDTH: 15 % (ref 12.2–15.2)
WBC ADJUSTED: 8.7 10*9/L (ref 3.6–11.2)

## 2023-11-26 LAB — COMPREHENSIVE METABOLIC PANEL
ALBUMIN: 3.9 g/dL (ref 3.4–5.0)
ALBUMIN: 4.1 g/dL (ref 3.4–5.0)
ALKALINE PHOSPHATASE: 193 U/L — ABNORMAL HIGH (ref 46–116)
ALKALINE PHOSPHATASE: 196 U/L — ABNORMAL HIGH (ref 46–116)
ALT (SGPT): 20 U/L (ref 10–49)
ALT (SGPT): 24 U/L (ref 10–49)
ANION GAP: 16 mmol/L — ABNORMAL HIGH (ref 5–14)
ANION GAP: 12 mmol/L (ref 5–14)
AST (SGOT): 23 U/L (ref <=34)
AST (SGOT): 24 U/L (ref <=34)
BILIRUBIN TOTAL: 0.3 mg/dL (ref 0.3–1.2)
BILIRUBIN TOTAL: 0.3 mg/dL (ref 0.3–1.2)
BLOOD UREA NITROGEN: 23 mg/dL (ref 9–23)
BLOOD UREA NITROGEN: 22 mg/dL (ref 9–23)
BUN / CREAT RATIO: 17
BUN / CREAT RATIO: 16
CALCIUM: 10.2 mg/dL (ref 8.7–10.4)
CALCIUM: 10.5 mg/dL — ABNORMAL HIGH (ref 8.7–10.4)
CHLORIDE: 96 mmol/L — ABNORMAL LOW (ref 98–107)
CHLORIDE: 97 mmol/L — ABNORMAL LOW (ref 98–107)
CO2: 29.4 mmol/L (ref 20.0–31.0)
CO2: 31 mmol/L (ref 20.0–31.0)
CREATININE: 1.36 mg/dL — ABNORMAL HIGH (ref 0.55–1.02)
CREATININE: 1.35 mg/dL — ABNORMAL HIGH (ref 0.55–1.02)
EGFR CKD-EPI (2021) FEMALE: 53 mL/min/{1.73_m2} — ABNORMAL LOW (ref >=60)
EGFR CKD-EPI (2021) FEMALE: 53 mL/min/{1.73_m2} — ABNORMAL LOW (ref >=60)
GLUCOSE RANDOM: 173 mg/dL (ref 70–179)
GLUCOSE RANDOM: 180 mg/dL — ABNORMAL HIGH (ref 70–179)
POTASSIUM: 4.2 mmol/L (ref 3.4–4.8)
POTASSIUM: 4.2 mmol/L (ref 3.4–4.8)
PROTEIN TOTAL: 8.2 g/dL (ref 5.7–8.2)
PROTEIN TOTAL: 7.9 g/dL (ref 5.7–8.2)
SODIUM: 141 mmol/L (ref 135–145)
SODIUM: 140 mmol/L (ref 135–145)

## 2023-11-26 LAB — FOLATE: FOLATE: 7.5 ng/mL (ref >=5.4)

## 2023-11-26 LAB — PARATHYROID HORMONE (PTH): PARATHYROID HORMONE INTACT: 136.8 pg/mL — ABNORMAL HIGH (ref 18.4–80.1)

## 2023-11-26 LAB — MAGNESIUM: MAGNESIUM: 2.2 mg/dL (ref 1.6–2.6)

## 2023-11-26 LAB — IRON & TIBC
IRON SATURATION: 16 % — ABNORMAL LOW (ref 20–55)
IRON: 56 ug/dL (ref 50–170)
TOTAL IRON BINDING CAPACITY: 356 ug/dL (ref 250–425)

## 2023-11-26 LAB — VITAMIN B12: VITAMIN B-12: 285 pg/mL (ref 211–911)

## 2023-11-26 LAB — TSH: THYROID STIMULATING HORMONE: 1.027 u[IU]/mL (ref 0.550–4.780)

## 2023-11-26 NOTE — Unmapped (Signed)
-----   Message from [facility], CPP sent at 11/26/2023  1:04 PM EST -----  Regarding: RE: Question  No considerations for surgery for either medication! Bypass of course does impact her TKI absorption, so we'll just have to watch for any loss of molecular response. The lupron should be able to be continued, it's just shutting down her ovaries for hopeful fertility preservation longterm so shouldn't impact anything here.  ----- Message -----  From: Avie Arenas, MD  Sent: 11/26/2023   1:02 PM EST  To: Leodis Liverpool; #  Subject: RE: Question                                     Hi Dannie Woolen,    No- she is on Bosutinib for her [name] but I don't see an indication to hold this for her gastric bypass surgery. CPP's- any further thoughts?    Thanks,    Josh  ----- Message -----  From: Zettie Cooley, FNP  Sent: 11/26/2023  12:49 PM EST  To: Harlow Mares, MD; #  Subject: Question                                         This patient is interested in undergoing gastric bypass surgery.  Can you let me know if there are any other any precautions we need to be advised of with her history of [name] and use of Lupron.    Arpan Eskelson L. Maliah Pyles MSN, FNP APRN-BC  [facility]  GI Surgery  Bariatric Surgery  11/26/2023

## 2023-11-26 NOTE — Unmapped (Signed)
EGD  Procedure #1     Procedure #2   [id number]  MRN   HODGES  Endoscopist     Is the patient's health insurance [employer], [employer], [place] [employer] (UHC), [employer] [place], [employer], or [place]?     Urgent procedure     Are you pregnant?     Are you in the process of scheduling or awaiting results of a heart ultrasound, stress test, or catheterization to evaluate new or worsening chest pain, dizziness, or shortness of breath?     Do you take: Plavix (clopidogrel), Coumadin (warfarin), Lovenox (enoxaparin), Pradaxa (dabigatran), Effient (prasugrel), Xarelto (rivaroxaban), Eliquis (apixaban), Pletal (cilostazol), or Brilinta (ticagrelor)?          Did ordering provider indicate how long to hold this medication in the order comments?          Which of the above medications are you taking?          What is the name of the medical practice that manages this medication?          What is the name of the medical provider who manages this medication?     Do you have hemophilia, von Willebrand disease, or low platelets?     Do you have a pacemaker or implanted cardiac defibrillator?     Has a [facility] provider specified the location(s)?     Which location(s) did the [facility] provider specify?          [facility]          [place]          [facility]     Do you see a liver specialist for chronic liver disease?     Is the procedure indication for variceal screening?     Is procedure indication for variceal banding (this does NOT include variceal screening)?     Have you had a heart attack, stroke or heart stent placement within the past 6 months?     Month of event     Year of event (ONLY ENTER LAST 2 DIGITS)        6  Height (feet)     Height (inches)   345  Weight (pounds)   46.8  BMI          Did the ordering provider specify a bowel prep?          What bowel prep was specified?     Do you have an ostomy (bag on your stomach that collects your stool)?          Is it an ileostomy?          Is it a colostomy? Patient doesn't know.     Do you have chronic kidney disease?     Do you have chronic constipation or have you had poor quality bowel preps for past colonoscopies?     Do you have Crohn's disease or ulcerative colitis?     Have you had weight loss surgery?        TRUE  When you walk around your house or grocery store, do you have to stop and rest due to shortness of breath, chest pain, or light-headedness?     Do you ever use supplemental oxygen?     Have you been hospitalized for cirrhosis of the liver or heart failure in the last 12 months?     Have you been treated for mouth or throat cancer with radiation or surgery?  Have you been told that it is difficult for doctors to insert a breathing tube in you during anesthesia?     Have you had a heart or lung transplant?          Are you on dialysis?     Do you have cirrhosis of the liver?     Do you have myasthenia gravis?     Is the patient a prisoner?   ################# ## ###################################################################################################################   MRN:  [id number]   Anticoag Review  No   Nurse Triage  No   GI clinic consult  No   Procedure(s):  EGD     0   Endoscopist:  HODGES   Urgent:  No   Prep:                    --------------------------- --- ----------------------------------------------------------------------------------------------------------------------------------------------------------------------------   [facility]:  [facility]                  Requested Locations:              ################# ## ###################################################################################################################

## 2023-11-26 NOTE — Unmapped (Signed)
[place] Department of Surgery  Division of Gastrointestinal Surgery  Bariatric Surgery  Please check out at the scheduler's desk.    For Approval I need you to have:  Endoscopy: [phone]  with Dr Yetta Flock  Blood work: 11/26/2023, it will expire 3-6 months from date of blood drawn.     Before our next visit, please:   Read all of the information provided in your [facility] Guide to Weight Loss Surgery Education booklet    Start making the dietary changes the Dietitian discussed with you today. Keep a food diary. No sugary drinks (avoid juices, sodas, ginger ale, sweet tea)    Begin a daily walking program with a minimum of 10 minutes of exercise outside of your routine, work your way up to 30 minutes daily.  Consider getting a pedometer.     See a dentist before surgery, then every 6 months after surgery    Weigh yourself once a week, if you do not have a digital scale consider getting 1.  We recommend [employer].      Your dietitian will assist with scheduling the mandatory Group Nutrition Class.   Any questions about RD visits should go directly to her (including if you have been cleared).      After your nutrition class, we will call you to schedule the Psychiatric evaluation  If you are under the care of a psychiatric provider, we will need a Mental Health Letter.     I will see you after you complete your formal work-up to review that all steps have been completed prior to surgery  Only the surgeon can give you the surgery date  Appointments: [phone], press 1    If you have questions about your out-of-pocket expense you can call a financial navigator [phone].  Provide the CPT code for your surgery- (they may ask)  Gastric Bypass 57846  Gastric Sleeve [id number]       You may need a letter of support from your Primary Care Provider, - have that faxed to [phone]. The bariatric surgery department will not take over your primary care needs.       Thank you,    Danilo Cappiello L. Reynard Christoffersen NP      Social Media:  We are on Facebook- [facility] Support Group  Search [place] [facility] click join.    Support Groups:     Www.zoom.com   ID: [id number]  Password: [facility]  1st  Monday of each month at 6 pm   3rd Thursday of each month at 1 pm    check website for more information   [email or web address]      Important websites:  www.[email or web address].com, or http://www.harvey.com/, www.[email or web address].com, www.bariatricfoodsource.com, www.[email or web address].com    Bariatric podcasts:  Jomarie Longs PHD podcast: [employer] [facility]  Bariatric nutrition coach podcast:  Corrinne Eagle  Active bariatric nutrition podcast:  Ethelda Chick  Bariatric surgery success podcast: Dr. Kary Kos

## 2023-11-26 NOTE — Unmapped (Signed)
[facility]   Medical Nutrition Therapy Consultation       Visit Type:    Initial Assessment    Referral Reason:   Bariatric weight loss surgery/Obesity     Jenna Mosley is a 33 y.o. female seen for medical nutrition therapy. Her active problem list, medication list, allergies, notes from last encounter, and lab results were reviewed. Patient is interested in gastric bypass. She has been considering weight loss surgery for since 2020. Her primary motivation is to improve  health to have a baby .    Her interim medical history is significant for Reflux, Hypertension, Polycystic ovarian syndrome, vitamin B12 deficiency, Obesity, Type II Diabetes, Non-alcoholic steatohepatitis, intestinal malabsorption, Gastroparesis, Cow's milk intolerance, Shortness of breath, Hyperkalemia, Heart Failure, Chronic renal impairment.    Social History:  Household members: Patient + mom   Who does the cooking and shopping: Patient (shops/cooks) + mom (cook)  Work: None    Anthropometrics   Height: 182.9 cm (6')   Weight: (!) 155.5 kg (342 lb 14.4 oz)  Body mass index is 46.51 kg/m??.    Wt Readings from Last 10 Encounters:   11/27/23 (!) 155.5 kg (342 lb 14.4 oz)   11/26/23 (!) 155.5 kg (342 lb 14.4 oz)   11/24/23 (!) 156.1 kg (344 lb 2.2 oz)   11/24/23 (!) 156.1 kg (344 lb 1.6 oz)   11/06/23 (!) 155.9 kg (343 lb 11.2 oz)   11/03/23 (!) 156.5 kg (345 lb)   10/30/23 (!) 156.8 kg (345 lb 9.6 oz)   10/01/23 (!) 163.8 kg (361 lb 1.8 oz)   09/18/23 (!) 163.5 kg (360 lb 6.4 oz)   09/04/23 (!) 162.4 kg (358 lb)      Goal weight per patient: no lower than 200#  Ideal Body Weight: 72.58 kg  Weight in (lb) to have BMI = 25: 183.9    Nutrition Risk Screening:   Food Insecurity: Food Insecurity Present (01/01/2023)    Hunger Vital Sign     Worried About Running Out of Food in the Last Year: Sometimes true     Ran Out of Food in the Last Year: Sometimes true      Nutrition Focused Physical Exam:   Nutrition Evaluation  Overall Impressions: Fluid accumulation noted, however unable to definitively designate manutrition as the etiology (11/27/23 2956)  Nutrition Designation: Obese class III  (BMI > 39.99 kg/m2) (11/27/23 2130)     Malnutrition Screening:   Patient does not meet AND/ASPEN criteria for malnutrition at this time (11/27/23 8657)    Biochemical Data, Medical Tests and Procedures:  All pertinent labs and imaging reviewed by Norman Clay, RD at 12:43 PM 11/26/2023.    Lab Results   Component Value Date    HGB 13.6 11/26/2023    HCT 40.7 11/26/2023    MCV 85.1 11/26/2023    IRON 56 11/26/2023    TIBC 356 11/26/2023    FERRITIN 104.3 11/26/2023    VITAMINB12 285 11/26/2023    FOLATE 7.5 11/26/2023    VITDTOTAL 34.1 07/31/2023     Lab Results   Component Value Date    ALKPHOS 196 (H) 11/26/2023    ALT 24 11/26/2023    AST 24 11/26/2023    BUN 22 11/26/2023    CREATININE 1.35 (H) 11/26/2023    GFRNONAA >=60 03/02/2018    GFRAA >=60 03/02/2018     Lab Results   Component Value Date    CHOL 215 (H) 07/31/2023  HDL 28 (L) 07/31/2023    LDL  07/31/2023      Comment:      Unable to calculate due to triglyceride greater than 400 mg/dL.    LDL 89.0 07/31/2023    NONHDL 187 (H) 07/31/2023    TRIG 721 (H) 07/31/2023    A1C 6.6 10/30/2023    TSH 1.027 11/26/2023     Medications and Vitamin/Mineral Supplementation:   All nutritionally pertinent medications reviewed on 11/26/2023  Nutritionally pertinent medications include: Coreg, Farxiga, Hydroxyzine, Insulin, Metformin, Mounjaro, Zofran, Protonix, Pregablin, Torsemide, Trazodone   She is taking nutrition supplements. Calcium-carb, vitamin D3, ferrous sulfate     Current Outpatient Medications   Medication Sig Dispense Refill    acetaminophen (TYLENOL) 500 MG tablet Take 2 tablets (1,000 mg total) by mouth every six (6) hours as needed for pain. Extra strength tylenol      albuterol HFA 90 mcg/actuation inhaler Inhale 2 puffs every six (6) hours as needed. 8 g 0 alcohol swabs (ALCOHOL PADS) PadM Apply 1 Swab topically Three (3) times a day before meals. 100 each 3    blood sugar diagnostic (ACCU-CHEK GUIDE TEST STRIPS) Strp Check sugars before meals three times for insulin dependent type two diabetes. E11.65 100 each 11    blood-glucose sensor (DEXCOM G7 SENSOR) Devi Change sensor every 10 days. 3 each 11    bosutinib 500 mg Tab Take 1 tablet (500 mg total) by mouth once daily. Administer with food. Swallow tablet whole; do not cut, crush, break, or chew. 30 tablet 2    calcium carbonate-vitamin D3 600 mg-20 mcg (800 unit) Tab Take 1 mg by mouth Two (2) times a day (at 8am and 12:00).      carvedilol (COREG) 6.25 MG tablet TAKE ONE TABLET BY MOUTH EVERY MORNING AND ONE EVERY EVENING WITH MEALS 60 tablet 6    cetirizine (ZYRTEC) 10 MG tablet TAKE 1 TABLET BY MOUTH IN THE MORNING. 90 tablet 3    cholecalciferol, vitamin D3, (VITAMIN D3 ORAL) Take by mouth.      clonazePAM (KLONOPIN) 0.5 MG tablet Take 1 tablet (0.5 mg total) by mouth daily as needed. PRN      cyclobenzaprine (FLEXERIL) 5 MG tablet Take 1 tablet (5 mg total) by mouth Three (3) times a day as needed (lower back pain). 90 tablet 1    dapagliflozin propanediol (FARXIGA) 10 mg Tab tablet Take 1 tablet (10 mg total) by mouth every morning. 90 tablet 3    divalproex ER (DEPAKOTE ER) 500 MG extended released 24 hr tablet TAKE 1 TABLET BY MOUTH AT BEDTIME 90 tablet 3    ferrous sulfate 325 (65 FE) MG tablet Take 1 tablet (325 mg total) by mouth in the morning.      fluticasone propionate (FLONASE) 50 mcg/actuation nasal spray 2 sprays into each nostril daily. (Patient taking differently: 2 sprays into each nostril as needed.) 16 g 0    hydrOXYzine (VISTARIL) 50 MG capsule Take 2 capsules (100 mg total) by mouth nightly. And takes PRN (Patient taking differently: Take 4 capsules (200 mg total) by mouth nightly. And takes PRN)      insulin regular hum U-500 conc (HUMULIN R U-500, CONC, KWIKPEN) 500 unit/mL (3 mL) CONCENTRATED injection Inject 150 Units under the skin in the morning. 30 mL 3    lamoTRIgine (LAMICTAL) 150 MG tablet Take 1 tablet (150 mg total) by mouth two (2) times a day.      lancets (ACCU-CHEK SOFTCLIX LANCETS) Misc  Check sugar three times per day before meals for insulin dependent type two diabetes.  E11.65 100 each 11    leuprolide acetate (LUPRON DEPOT IM) Inject into the muscle. Every 3 months      metFORMIN (GLUCOPHAGE) 1000 MG tablet TAKE ONE TABLET BY MOUTH TWICE DAILY IN THE MORNING AND IN THE EVENING. TAKE WITH MEALS 180 tablet 3    MOUNJARO 7.5 mg/0.5 mL PnIj 7.5 mg.      norethindrone (AYGESTIN) 5 mg tablet TAKE ONE TABLET EVERY MORNING 30 tablet 11    ondansetron (ZOFRAN-ODT) 4 MG disintegrating tablet Take 1 tablet (4 mg total) by mouth every eight (8) hours as needed for nausea. 30 tablet 1    pantoprazole (PROTONIX) 40 MG tablet TAKE 1 TABLET BY MOUTH IN THE MORNING. 90 tablet 3    pregabalin (LYRICA) 75 MG capsule TAKE 1 CAPSULE BY MOUTH IN THE MORNING AND 2 CAPSULES BY MOUTH IN THE EVENING. 270 capsule 0    sulfamethoxazole-trimethoprim (BACTRIM DS) 800-160 mg per tablet Take 1 tablet (160 mg of trimethoprim total) by mouth two (2) times a day for 10 days. 20 tablet 0    TECHLITE PEN NEEDLE 32 gauge x 1/4 (6 mm) Ndle USE 3 TIMES DAILY AS DIRECTED 300 each 4    [START ON 01/22/2024] tirzepatide (MOUNJARO) 10 mg/0.5 mL PnIj Inject 10 mg under the skin every seven (7) days for 4 doses. 2 mL 0    torsemide (DEMADEX) 20 MG tablet TAKE FOUR TABLETS BY MOUTH EVERY DAY 120 tablet 6    traZODone (DESYREL) 100 MG tablet TAKE 2 TABLETS BY MOUTH AT BEDTIME 180 tablet 3    ziprasidone (GEODON) 80 MG capsule Take 1 capsule (80 mg total) by mouth in the morning and 1 capsule (80 mg total) in the evening. Take with meals.       No current facility-administered medications for this visit.     Facility-Administered Medications Ordered in Other Visits   Medication Dose Route Frequency Provider Last Rate Last Admin    leuprolide (LUPRON) injection 11.25 mg  11.25 mg Intramuscular Once Coombs, Leta Speller, MD         Nutrition History:     Dietary Restrictions: No known food allergies. She noted food intolerance to milk.    Gastrointestinal Issues: Nausea Constipation Heartburn. Takes Miralax and Protonix.    Hunger and Satiety:  Patient does not endorse hunger with Mounjaro.     Food Safety and Access: She has SNAP/EBT benefits.     Diet Recall: UP @ 11AM [takes insulin at 120mg  in the morning] [Farxiga]  Time Intake   Breakfast (12PM) Chicken sandwich (low carb) OR Chicken biscuit from restaurant    Snack (AM)    Lunch (3-4PM) Cabbage + kielbasa sausage + onions  Cauliflower rice with [place] and vegetables  [place] wrap (lavash wrap)   Pork BBQ  Taco salad (beans instead of rice with grilled chicken)  Chicken nuggets + [employer] chilli    Snack (5-6 PM) Berries OR skinny pop popcorn OR pork rind OR peanut butter + banana   Dinner    Snack (HS) Candy or 1/2 bottle soft drink to bring up blood sugar   Carbs sources: fruits and vegetables, sweet potato, popcorn, biscuit  Protein sources: egg frittata, chicken, pork, peanut butter, cheese made crackers, ground [place],     Food-Related History:  Snacks: listed above. Will eat candy to get blood sugar back up.  Beverages: try to get 80 oz water  doesn't always meet, *no fluid restriction* hint waters, SF juice, Mio drops, diet soda  Dining Out: usually [employer]  Cooking Methods: grilled, fried from convenience  Meal Schedule: 1-2 meal, 1 snack        Eating Behaviors:  Excessive Hunger: Not anytime recently, I used to have binge eating disorder. I haven't had binge eating in like 15 years.  Grazing: Denied issues.   Nighttime Eating:  to help with low blood sugar, but would be a candy or a soda   Fast Eating: Consumes foods quickly.  Takes large bites of food.   Overeating: Denied issues with overeating.     Application of Bariatric Dietary Behaviors:  Not drinking with meals and waiting for 30 minutes after - not accomplished  Focusing on protein rich foods at meals/snacks - progressing  Mindful eating - progressing  Eliminating carbonated/sugar sweetened beverages - progressing  Choosing a protein shake and a bariatric vitamins - not accomplished   Protein shake: None selected                             Bariatric Multivitamin: None selected    Physical Activity:  Physical activity level is sedentary with little to no exercise.   Activity level is impacted by recent dog bite.)    Type: Gym (Walking, strength training, stretching) previously before injury   Duration: 1 hour   Frequency: Not back in the gym.    Daily Estimated Nutritional Needs:  Energy: 2371 kcals [Per [place] St-Jeor Equation (AF of 1.0) using  , 155.54 kg (11/27/23 0943)]  Protein: 83-100 gm [1.0-1.2 gm/kg using  , 83.59 kg (BMI of 25) (11/27/23 0943)]  Carbohydrate:   [< / equal to 45% of kcal]  Fluid: 2000-3000 mL *no fluid restriction per patientMarijo File MD team]    Nutrition Goals & Evaluation      Demonstrate understanding of all bariatric nutrition/dietary behaviors  (New)  Demonstrate behavioral change  (New)  Meet nutritional needs (New)  Successful application of  nutrition goals (New)  Reduce weight by 0.5-1.0 lbs per week by next next nutrition visit (New)    Nutrition goals reviewed, relevant barriers identified and addressed: none evident. She is evaluated to have good to fair willingness and ability to achieve nutrition goals.     Nutrition Assessment       The patient is currently not meeting estimated nutrition needs related to current intake. Per anthropometric data, BMI: 46.51 kg/m2 classifies patient as obese (class III). EMR indicates a 16 lb weight loss over last 3 months. Recent labs indicate elevated triglycerides, cholesterol, alkaline phosphatase, and a low iron. She is eating 1-2 meals and 1 snack per day due to poor appetite caused by side effects of Mounjaro. Patient would benefit from a more consistent meal schedule to better regulate blood sugars, increase regular intake, and to increase chances of meeting nutritional needs. Patient would benefit from incorporating protein-rich snacks into day. Patient is focused on protein at meals with carbohydrates coming mostly from starchy sources. Patient is encouraged to be mindful of sodium, fat, and simple carbohydrate sources to decrease adverse effects of conditions. Patient also is a selective eater would benefit from prioritizing protein and complex carb sources. Beverages are mostly sugar free and sometimes non-carbonated. She has not started practicing not drinking with meals. She is not participating in a regular exercise regimen. She has not selected an appropriate bariatric multivitamin.     Bariatric  Surgery Assessment: RD is withholding assessment for insurance approval for weight loss surgery pending patient follow-up where success with plan goals and food record/usual intake/24-hour recall will be assessed.        Nutrition Intervention      - Nutrition Education: Bariatric dietary and lifestyle habits. Education resource(s) or methods provided:: After visit summary with patient instructions     Nutrition Plan:   Please see patient instructions.    Follow up will occur in 1 month.     Food/Nutrition-related history, Anthropometric measurements, Biochemical data, medical tests, procedures, Patient understanding or compliance with intervention and recommendations , and Effectiveness of nutrition interventions will be assessed at time of follow-up.     Recommendations for Clinical Team:  Continue to recommend following a consistent meal/snack schedule with a focus on protein. Emphasize a variety of exercise (aerobic, strength, stretching and balance) outside of daily routine should also be encouraged for 150-300 minutes per week.      Patient seen according to established protocol for bariatric care.     Time spent 70 minutes
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
## 2023-11-27 DIAGNOSIS — C9210 Chronic myeloid leukemia, BCR/ABL-positive, not having achieved remission: Principal | ICD-10-CM

## 2023-11-27 DIAGNOSIS — R7989 Other specified abnormal findings of blood chemistry: Principal | ICD-10-CM

## 2023-11-27 LAB — VITAMIN D 25 HYDROXY: VITAMIN D, TOTAL (25OH): 44.1 ng/mL (ref 20.0–80.0)

## 2023-11-27 LAB — PHOSPHORUS: PHOSPHORUS: 3.8 mg/dL (ref 2.4–5.1)
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
## 2023-11-27 NOTE — Unmapped (Addendum)
GOALS:    1.  Meal schedule: remember to eat protein every 3-4 hours    - include protein at every meal and every snack   10:00am: breakfast - include protein   1:00pm: lunch - protein + non-starchy vegetables   4:00pm: protein based snack   6:30pm: dinner - protein + non-starchy vegetables   9:00pm: carb-based snack    PROTEIN OPTIONS: chicken/beef/pork, fish/tuna, eggs, cheese, cottage cheese, [place] yogurt, deli meat, protein shakes, beans, nuts/seeds, peanut butter   * Nuts and peanut butter are restricted to one serving once per day.    1/4 cup nuts OR 2 Tbsp peanut butter    2.  Eat protein foods first. Vegetables second. Carbohydrates/fruits last.    - encourage low sodium and fat options, and complex carbohydrate sources over simple options    3.  Practice not drinking at meals and waiting for 30 minutes after meals to start drinking again.    4.  Practice mindful eating. Eat until you are satisfied. Do not go to the point of being full.    5.  Eliminate carbonated/sugar sweetened beverages!   - NO juice, soda, diet soda, sweet tea, lemonade, Kool-Aid   - encourage regular meal intake to regulate blood sugar to decrease possibility of hypoglycemia    6.  Research vitamins and try protein shakes.   - Protein shakes can be made at home using a whey protein powder + water or low fat milk or an unsweetened milk alternative    - do not add frozen fruit, bananas or peanut butter to homemade protein shakes   - Premade protein shakes should have at least 20g of protein and carbs/sugars should be in the single digits    - avoid products labeled keto    - options include: Premier Protein, Ensure/Boost Max Protein    - be sure to check the nutrition label on any product you choose    7.  EXERCISE: Do activity outside of daily routine 3x/wk for 30-45 minutes  - do activities you enjoy to establish consistency  - increase duration/frequency/intensity as tolerated  - aim for variety in routine with a mix of aerobic, strength, stretching and balance  - GOAL: 150-300 minutes per week     8.  Your next follow-up visit will determine if you are ready for the nutrition class.

## 2023-11-30 DIAGNOSIS — C9210 Chronic myeloid leukemia, BCR/ABL-positive, not having achieved remission: Principal | ICD-10-CM

## 2023-11-30 DIAGNOSIS — R7989 Other specified abnormal findings of blood chemistry: Principal | ICD-10-CM

## 2023-11-30 LAB — NICOTINE SCREEN, URINE
ANABASINE, URINE: <2 ng/mL
COTININE, URINE: <5 ng/mL
NICOTINE, URINE: <5 ng/mL
NORNICOTINE, URINE: <2 ng/mL

## 2023-11-30 LAB — COPPER, SERUM: COPPER: 176 ug/dL

## 2023-11-30 LAB — ZINC: ZINC: 70 ug/dL

## 2023-11-30 MED ORDER — BOSULIF 500 MG TABLET
ORAL_TABLET | 2 refills | 0.00 days
Start: 2023-11-30 — End: ?

## 2023-11-30 NOTE — Unmapped (Signed)
[facility] Clinical Assessment & Refill Coordination Note    Jenna Mosley, DOB: May 03, 1991  Phone: [phone] (home) [phone] (work)    All above HIPAA information was verified with patient.     Was a Nurse, learning disability used for this call? No    Specialty Medication(s):   Hematology/Oncology: Bosulif     Current Outpatient Medications   Medication Sig Dispense Refill    acetaminophen (TYLENOL) 500 MG tablet Take 2 tablets (1,000 mg total) by mouth every six (6) hours as needed for pain. Extra strength tylenol      albuterol HFA 90 mcg/actuation inhaler Inhale 2 puffs every six (6) hours as needed. 8 g 0    alcohol swabs (ALCOHOL PADS) PadM Apply 1 Swab topically Three (3) times a day before meals. 100 each 3    blood sugar diagnostic (ACCU-CHEK GUIDE TEST STRIPS) Strp Check sugars before meals three times for insulin dependent type two diabetes. E11.65 100 each 11    blood-glucose sensor (DEXCOM G7 SENSOR) Devi Change sensor every 10 days. 3 each 11    bosutinib 500 mg Tab Take 1 tablet (500 mg total) by mouth once daily. Administer with food. Swallow tablet whole; do not cut, crush, break, or chew. 30 tablet 2    calcium carbonate-vitamin D3 600 mg-20 mcg (800 unit) Tab Take 1 mg by mouth Two (2) times a day (at 8am and 12:00).      carvedilol (COREG) 6.25 MG tablet TAKE ONE TABLET BY MOUTH EVERY MORNING AND ONE EVERY EVENING WITH MEALS 60 tablet 6    cetirizine (ZYRTEC) 10 MG tablet TAKE 1 TABLET BY MOUTH IN THE MORNING. 90 tablet 3    cholecalciferol, vitamin D3, (VITAMIN D3 ORAL) Take by mouth.      clonazePAM (KLONOPIN) 0.5 MG tablet Take 1 tablet (0.5 mg total) by mouth daily as needed. PRN      cyclobenzaprine (FLEXERIL) 5 MG tablet Take 1 tablet (5 mg total) by mouth Three (3) times a day as needed (lower back pain). 90 tablet 1    dapagliflozin propanediol (FARXIGA) 10 mg Tab tablet Take 1 tablet (10 mg total) by mouth every morning. 90 tablet 3    divalproex ER (DEPAKOTE ER) 500 MG extended released 24 hr tablet TAKE 1 TABLET BY MOUTH AT BEDTIME 90 tablet 3    ferrous sulfate 325 (65 FE) MG tablet Take 1 tablet (325 mg total) by mouth in the morning.      fluticasone propionate (FLONASE) 50 mcg/actuation nasal spray 2 sprays into each nostril daily. (Patient taking differently: 2 sprays into each nostril as needed.) 16 g 0    hydrOXYzine (VISTARIL) 50 MG capsule Take 2 capsules (100 mg total) by mouth nightly. And takes PRN (Patient taking differently: Take 4 capsules (200 mg total) by mouth nightly. And takes PRN)      insulin regular hum U-500 conc (HUMULIN R U-500, CONC, KWIKPEN) 500 unit/mL (3 mL) CONCENTRATED injection Inject 150 Units under the skin in the morning. 30 mL 3    lamoTRIgine (LAMICTAL) 150 MG tablet Take 1 tablet (150 mg total) by mouth two (2) times a day.      lancets (ACCU-CHEK SOFTCLIX LANCETS) Misc Check sugar three times per day before meals for insulin dependent type two diabetes.  E11.65 100 each 11    leuprolide acetate (LUPRON DEPOT IM) Inject into the muscle. Every 3 months      metFORMIN (GLUCOPHAGE) 1000 MG tablet TAKE ONE TABLET  BY MOUTH TWICE DAILY IN THE MORNING AND IN THE EVENING. TAKE WITH MEALS 180 tablet 3    MOUNJARO 7.5 mg/0.5 mL PnIj 7.5 mg.      norethindrone (AYGESTIN) 5 mg tablet TAKE ONE TABLET EVERY MORNING 30 tablet 11    ondansetron (ZOFRAN-ODT) 4 MG disintegrating tablet Take 1 tablet (4 mg total) by mouth every eight (8) hours as needed for nausea. 30 tablet 1    pantoprazole (PROTONIX) 40 MG tablet TAKE 1 TABLET BY MOUTH IN THE MORNING. 90 tablet 3    pregabalin (LYRICA) 75 MG capsule TAKE 1 CAPSULE BY MOUTH IN THE MORNING AND 2 CAPSULES BY MOUTH IN THE EVENING. 270 capsule 0    sulfamethoxazole-trimethoprim (BACTRIM DS) 800-160 mg per tablet Take 1 tablet (160 mg of trimethoprim total) by mouth two (2) times a day for 10 days. 20 tablet 0    TECHLITE PEN NEEDLE 32 gauge x 1/4 (6 mm) Ndle USE 3 TIMES DAILY AS DIRECTED 300 each 4 [START ON 01/22/2024] tirzepatide (MOUNJARO) 10 mg/0.5 mL PnIj Inject 10 mg under the skin every seven (7) days for 4 doses. 2 mL 0    torsemide (DEMADEX) 20 MG tablet TAKE FOUR TABLETS BY MOUTH EVERY DAY 120 tablet 6    traZODone (DESYREL) 100 MG tablet TAKE 2 TABLETS BY MOUTH AT BEDTIME 180 tablet 3    ziprasidone (GEODON) 80 MG capsule Take 1 capsule (80 mg total) by mouth in the morning and 1 capsule (80 mg total) in the evening. Take with meals.       No current facility-administered medications for this visit.     Facility-Administered Medications Ordered in Other Visits   Medication Dose Route Frequency Provider Last Rate Last Admin    leuprolide (LUPRON) injection 11.25 mg  11.25 mg Intramuscular Once Coombs, Leta Speller, MD            Changes to medications:  decreased insulin, Bactrim and Farxiga     Allergies   Allergen Reactions    Augmentin [Amoxicillin-Pot Clavulanate] Angioedema     Rash and angioedema    Lisinopril Shortness Of Breath     Other Reaction(s): chest pain    SOB, chest painSOB, chest pain    Naproxen Nausea Only, Palpitations and Other (See Comments)     Chest palpitations and feels like flying    Aripiprazole Other (See Comments)     Inability to urinate    Other Reaction(s): cannot void    Ciprofloxacin Other (See Comments)     Does not work    Fluphenazine      mental health problems    Metoclopramide Other (See Comments)     Mania    ManiaMania    Milk Containing Products (Dairy) Diarrhea and Other (See Comments)    Prednisone Other (See Comments)     mania    Reglan [Metoclopramide Hcl] Other (See Comments)     Induces mania    Turmeric Other (See Comments)    Diphenhydramine Hcl Anxiety     Other Reaction(s): mania    Multihance [Gadobenate Dimeglumine] Nausea And Vomiting    Ondansetron Hcl Anxiety    Promethazine Anxiety       Changes to allergies: No    SPECIALTY MEDICATION ADHERENCE     Bosulif 500 mg: 6 days of medicine on hand         Medication Adherence Patient reported X missed doses in the last month: 0  Specialty Medication: Bosulif 500mg   Patient  is on additional specialty medications: No  Informant: patient          Specialty medication(s) dose(s) confirmed: Regimen is correct and unchanged.     Are there any concerns with adherence? No    Adherence counseling provided? Not needed    CLINICAL MANAGEMENT AND INTERVENTION      Clinical Benefit Assessment:    Do you feel the medicine is effective or helping your condition? Yes    Clinical Benefit counseling provided? Not needed    Adverse Effects Assessment:    Are you experiencing any side effects? Yes, patient reports experiencing nausea. Side effect counseling provided: takes Zofran prn    Are you experiencing difficulty administering your medicine? No    Quality of Life Assessment:    Quality of Life    Rheumatology  Oncology  1. What impact has your specialty medication had on the reduction of your daily pain or discomfort level?: None  2. On a scale of 1-10, how would you rate your ability to manage side effects associated with your specialty medication? (1=no issues, 10 = unable to take medication due to side effects): 1  Dermatology  Cystic Fibrosis          How many days over the past month did your condition  keep you from your normal activities? For example, brushing your teeth or getting up in the morning. 0    Have you discussed this with your provider? Not needed    Acute Infection Status:    Acute infections noted within Epic:  No active infections  Patient reported infection:  UTI- patient reported to provider    Therapy Appropriateness:    Is therapy appropriate based on current medication list, adverse reactions, adherence, clinical benefit and progress toward achieving therapeutic goals? Yes, therapy is appropriate and should be continued     DISEASE/MEDICATION-SPECIFIC INFORMATION      N/A    Oncology: Is the patient receiving adequate infection prevention treatment? Not applicable  Does the patient have adequate nutritional support? Not applicable    PATIENT SPECIFIC NEEDS     Does the patient have any physical, cognitive, or cultural barriers? No    Is the patient high risk? Yes, patient is taking oral chemotherapy. Appropriateness of therapy as been assessed    Did the patient require a clinical intervention? No    Does the patient require physician intervention or other additional services (i.e., nutrition, smoking cessation, social work)? No    SOCIAL DETERMINANTS OF HEALTH     At the [facility], we have learned that life circumstances - like trouble affording food, housing, utilities, or transportation can affect the health of many of our patients.   That is why we wanted to ask: are you currently experiencing any life circumstances that are negatively impacting your health and/or quality of life? No    Social Drivers of Health     Food Insecurity: Food Insecurity Present (01/01/2023)    Hunger Vital Sign     Worried About Running Out of Food in the Last Year: Sometimes true     Ran Out of Food in the Last Year: Sometimes true   Internet Connectivity: Not on file   Housing/Utilities: Low Risk  (01/01/2023)    Housing/Utilities     Within the past 12 months, have you ever stayed: outside, in a car, in a tent, in an overnight shelter, or temporarily in someone else's home (i.e. couch-surfing)?: No     Are you  worried about losing your housing?: No     Within the past 12 months, have you been unable to get utilities (heat, electricity) when it was really needed?: No   Tobacco Use: Medium Risk (11/24/2023)    Patient History     Smoking Tobacco Use: Former     Smokeless Tobacco Use: Never     Passive Exposure: Past   Transportation Needs: No Transportation Needs (01/01/2023)    PRAPARE - Therapist, art (Medical): No     Lack of Transportation (Non-Medical): No   Alcohol Use: Not At Risk (05/14/2021)    Alcohol Use     How often do you have a drink containing alcohol?: Never How many drinks containing alcohol do you have on a typical day when you are drinking?: Not on file     How often do you have 5 or more drinks on one occasion?: Not on file   Interpersonal Safety: Not on file   Physical Activity: Not on file   Intimate Partner Violence: Not on file   Stress: Not on file   Substance Use: Not on file (09/21/2023)   Social Connections: Not on file   Financial Resource Strain: Medium Risk (01/01/2023)    Overall Financial Resource Strain (CARDIA)     Difficulty of Paying Living Expenses: Somewhat hard   Depression: Not at risk (04/10/2021)    PHQ-2     PHQ-2 Score: 2   Recent Concern: Depression - At risk (04/02/2021)    PHQ-2     PHQ-2 Score: 3   Health Literacy: Medium Risk (05/14/2021)    Health Literacy     : Sometimes       Would you be willing to receive help with any of the needs that you have identified today? Not applicable       SHIPPING     Specialty Medication(s) to be Shipped:   Hematology/Oncology: Bosulif    Other medication(s) to be shipped: No additional medications requested for fill at this time     Changes to insurance: No    Delivery Scheduled: Yes, Expected medication delivery date: 12/03/23.  However, Rx request for refills was sent to the provider as there are none remaining.     Medication will be delivered via Next Day Courier to the confirmed prescription address in [facility].    The patient will receive a drug information handout for each medication shipped and additional FDA Medication Guides as required.  Verified that patient has previously received a Conservation officer, historic buildings and a Surveyor, mining.    The patient or caregiver noted above participated in the development of this care plan and knows that they can request review of or adjustments to the care plan at any time.      All of the patient's questions and concerns have been addressed.    Jenna Mosley, PharmD   [facility] Specialty Pharmacist

## 2023-11-30 NOTE — Unmapped (Signed)
Pt is requesting refill    Most recent clinic visit: 10/01/2023  Next clinic visit:  01/07/2024

## 2023-12-02 LAB — VITAMIN B1, WHOLE BLOOD: VITAMIN B1: 132 nmol/L

## 2023-12-02 NOTE — Unmapped (Signed)
Vitamin B1 copper and zinc all in normal range.

## 2023-12-03 DIAGNOSIS — C9210 Chronic myeloid leukemia, BCR/ABL-positive, not having achieved remission: Principal | ICD-10-CM

## 2023-12-03 MED ORDER — BOSUTINIB 500 MG TABLET
ORAL_TABLET | 2 refills | 0.00 days | Status: CP
Start: 2023-12-03 — End: ?
  Filled 2023-12-04: qty 30, 30d supply, fill #0

## 2023-12-07 MED ORDER — MOUNJARO 7.5 MG/0.5 ML SUBCUTANEOUS PEN INJECTOR
0 refills | 0.00 days
Start: 2023-12-07 — End: ?

## 2023-12-08 DIAGNOSIS — C9210 Chronic myeloid leukemia, BCR/ABL-positive, not having achieved remission: Principal | ICD-10-CM

## 2023-12-08 MED ORDER — MOUNJARO 7.5 MG/0.5 ML SUBCUTANEOUS PEN INJECTOR
0 refills | 0.00 days | Status: CP
Start: 2023-12-08 — End: ?

## 2023-12-17 ENCOUNTER — Ambulatory Visit
Admit: 2023-12-17 | Discharge: 2023-12-17 | Payer: PRIVATE HEALTH INSURANCE | Attending: Nephrology | Primary: Nephrology

## 2023-12-17 ENCOUNTER — Ambulatory Visit: Admit: 2023-12-17 | Discharge: 2023-12-17 | Payer: PRIVATE HEALTH INSURANCE

## 2023-12-17 DIAGNOSIS — N1831 Stage 3a chronic kidney disease (CMS-HCC): Principal | ICD-10-CM

## 2023-12-17 DIAGNOSIS — R7989 Other specified abnormal findings of blood chemistry: Principal | ICD-10-CM

## 2023-12-17 LAB — ALBUMIN / CREATININE URINE RATIO
ALBUMIN QUANT URINE: <0.3 mg/dL
CREATININE, URINE: 16.7 mg/dL

## 2023-12-17 LAB — COMPREHENSIVE METABOLIC PANEL
ALBUMIN: 3.8 g/dL (ref 3.4–5.0)
ALKALINE PHOSPHATASE: 204 U/L — ABNORMAL HIGH (ref 46–116)
ALT (SGPT): 25 U/L (ref 10–49)
ANION GAP: 13 mmol/L (ref 5–14)
AST (SGOT): 33 U/L (ref <=34)
BILIRUBIN TOTAL: 0.3 mg/dL (ref 0.3–1.2)
BLOOD UREA NITROGEN: 21 mg/dL (ref 9–23)
BUN / CREAT RATIO: 17
CALCIUM: 10.6 mg/dL — ABNORMAL HIGH (ref 8.7–10.4)
CHLORIDE: 94 mmol/L — ABNORMAL LOW (ref 98–107)
CO2: 32.1 mmol/L — ABNORMAL HIGH (ref 20.0–31.0)
CREATININE: 1.26 mg/dL — ABNORMAL HIGH (ref 0.55–1.02)
EGFR CKD-EPI (2021) FEMALE: 58 mL/min/{1.73_m2} — ABNORMAL LOW (ref >=60)
GLUCOSE RANDOM: 110 mg/dL (ref 70–179)
POTASSIUM: 3.9 mmol/L (ref 3.4–4.8)
PROTEIN TOTAL: 7.9 g/dL (ref 5.7–8.2)
SODIUM: 139 mmol/L (ref 135–145)

## 2023-12-17 LAB — PROTEIN / CREATININE RATIO, URINE
CREATININE, URINE: 16.6 mg/dL
PROTEIN URINE: 8.6 mg/dL
PROTEIN/CREAT RATIO, URINE: 0.518

## 2023-12-17 LAB — PHOSPHORUS: PHOSPHORUS: 3.7 mg/dL (ref 2.4–5.1)

## 2023-12-17 LAB — MAGNESIUM: MAGNESIUM: 2 mg/dL (ref 1.6–2.6)

## 2023-12-17 NOTE — Unmapped (Addendum)
Medicines to Avoid With Kidney Disease: Care Instructions  Overview     Kidney disease means that your kidneys are not able to get rid of waste from the blood. So they can't keep your body's fluids and chemicals in balance. Usually, the kidneys get rid of waste from the blood through the urine. And they balance the fluids in the body.  When your kidneys don't work as they should, you have to be careful about some medicines. They may harm your kidneys. Your doctor may tell you not to take them or may change the dose.  Medicines for pain and swelling, such as ibuprofen (Advil or Motrin) or naproxen (Aleve), can cause harm. So can some antibiotics and antacids. And you need to be careful about some drugs that treat cancer, lower blood pressure, or get rid of water from the body. Some herbal products could cause harm too.  Follow-up care is a key part of your treatment and safety. Be sure to make and go to all appointments, and call your doctor if you are having problems. It's also a good idea to know your test results and keep a list of the medicines you take.  How can you care for yourself at home?  Tell your doctor all the prescription, herbal, or over-the-counter medicines you take. Do not take any new ones unless you talk to your doctor first.  Do not take anti-inflammatory medicines. These include ibuprofen (Advil, Motrin) and naproxen (Aleve). You can use acetaminophen (Tylenol) for pain.  Do not take two or more pain medicines at the same time unless the doctor told you to. Many pain medicines have acetaminophen, which is Tylenol. Too much acetaminophen (Tylenol) can be harmful.  Tell all doctors and others who work with your health care that you have kidney disease.  Wear medical alert jewelry that lists your health problem. You can buy this at most drugstores.  Where can you learn more?  Go to [facility] at https://[facility].Armed forces logistics/support/administrative officer in the Menu. Enter [phone] in the search box to learn more about Medicines to Avoid With Kidney Disease: Care Instructions.  Current as of: July 25, 2020               Content Version: 62.1      Dear Jenna Mosley  It was very nice to meet you and your mother today.    As we discussed, you are here for your kidneys.  Specifically you have a decrease in your kidney function that is lower than expected for someone your age.  Technically your kidney function is at 60% of normal which puts you at about stage III chronic kidney disease.    At the moment, you are doing everything that you can to keep your kidneys healthy for as long as possible.  This includes keeping a check on your blood sugar and being on both Jenna Mosley.  You are also doing a good job with your blood pressure on carvedilol and Jenna Mosley.  As we discussed, I am glad that you are avoiding NSAIDs and careful with any new medications with your care team.    I do not need to check any blood test today.  I would like to check your urine for protein.    Finally, I am in favor of the planned bariatric surgery.  I think it would be medically beneficial for you.    Lets plan on having him return in about 6 months.    Sincerely,  Dr. Kirtland Bouchard
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
## 2023-12-18 DIAGNOSIS — R7989 Other specified abnormal findings of blood chemistry: Principal | ICD-10-CM

## 2023-12-18 LAB — PARATHYROID HORMONE (PTH): PARATHYROID HORMONE INTACT: 64.2 pg/mL (ref 18.4–80.1)

## 2023-12-18 NOTE — Unmapped (Signed)
Labs show improved renal function.  Blood chemistries are stable.  Parathyroid hormone is now normal.  Not sure why it was so elevated last time.  Could be lab error.    This is reassuring.     Willia Craze, MD MPH  [facility]  [facility]  [address], [place], [place] [address]  p [phone]

## 2023-12-21 ENCOUNTER — Encounter: Admit: 2023-12-21 | Discharge: 2023-12-21 | Payer: PRIVATE HEALTH INSURANCE

## 2023-12-21 ENCOUNTER — Inpatient Hospital Stay: Admit: 2023-12-21 | Discharge: 2023-12-21 | Payer: PRIVATE HEALTH INSURANCE

## 2023-12-21 MED ADMIN — lactated Ringers infusion: INTRAVENOUS | @ 16:00:00 | Stop: 2023-12-21

## 2023-12-21 MED ADMIN — fentaNYL (PF) (SUBLIMAZE) injection: INTRAVENOUS | @ 16:00:00 | Stop: 2023-12-21

## 2023-12-21 MED ADMIN — ePHEDrine (PF) 25 mg/5 mL (5 mg/mL) in 0.9% sodium chloride syringe Syrg: INTRAVENOUS | @ 17:00:00 | Stop: 2023-12-21

## 2023-12-21 MED ADMIN — albuterol (PROVENTIL HFA;VENTOLIN HFA) 90 mcg/actuation inhaler: RESPIRATORY_TRACT | @ 17:00:00 | Stop: 2023-12-21

## 2023-12-21 MED ADMIN — midazolam (VERSED) injection: INTRAVENOUS | @ 16:00:00 | Stop: 2023-12-21

## 2023-12-21 MED ADMIN — succinylcholine chloride (ANECTINE) injection: INTRAVENOUS | @ 16:00:00 | Stop: 2023-12-21

## 2023-12-21 MED ADMIN — phenylephrine 1 mg/10 mL (100 mcg/mL) injection Syrg: INTRAVENOUS | @ 16:00:00 | Stop: 2023-12-21

## 2023-12-21 MED ADMIN — dexAMETHasone (DECADRON) 4 mg/mL injection: INTRAVENOUS | @ 16:00:00 | Stop: 2023-12-21

## 2023-12-21 MED ADMIN — Propofol (DIPRIVAN) injection: INTRAVENOUS | @ 16:00:00 | Stop: 2023-12-21

## 2023-12-21 MED ADMIN — glycopyrrolate (ROBINUL) injection: INTRAVENOUS | @ 17:00:00 | Stop: 2023-12-21

## 2023-12-21 MED ADMIN — propofol (DIPRIVAN) infusion 10 mg/mL: INTRAVENOUS | @ 16:00:00 | Stop: 2023-12-21

## 2023-12-21 MED ADMIN — ondansetron (ZOFRAN) injection: INTRAVENOUS | @ 16:00:00 | Stop: 2023-12-21

## 2023-12-21 MED ADMIN — dexmedeTOMIDine (Precedex) injection: INTRAVENOUS | @ 17:00:00 | Stop: 2023-12-21

## 2023-12-21 MED ADMIN — lidocaine (PF) (XYLOCAINE-MPF) 20 mg/mL (2 %) injection: INTRAVENOUS | @ 16:00:00 | Stop: 2023-12-21
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
## 2023-12-22 DIAGNOSIS — C9210 Chronic myeloid leukemia, BCR/ABL-positive, not having achieved remission: Principal | ICD-10-CM

## 2023-12-28 ENCOUNTER — Encounter: Admit: 2023-12-28 | Discharge: 2023-12-29 | Payer: PRIVATE HEALTH INSURANCE

## 2023-12-28 NOTE — Unmapped (Signed)
[facility]patient Nutrition Services   Medical Nutrition Therapy Consultation       Visit Type:    Return Assessment    Referral Reason:   Bariatric weight loss surgery     Keyly Baldonado is a 33 y.o. female seen for medical nutrition therapy. Her active problem list, medication list, allergies, notes from last encounter, and lab results were reviewed.     Her interim medical history is significant for Reflux, Hypertension, Polycystic ovarian syndrome, vitamin B12 deficiency, Obesity, Type II Diabetes, Non-alcoholic steatohepatitis, intestinal malabsorption, Gastroparesis, Cow's milk intolerance, Shortness of breath, Hyperkalemia, Heart Failure, Chronic renal impairment..    Anthropometrics   Height: 182.9 cm (6')   Weight: (!) 154 kg (339 lb 6.4 oz)  Body mass index is 46.03 kg/m??.    Wt Readings from Last 5 Encounters:   12/29/23 (!) 154 kg (339 lb 6.4 oz)   12/21/23 (!) 153.9 kg (339 lb 6.4 oz)   12/17/23 (!) 154.2 kg (340 lb)   11/27/23 (!) 155.5 kg (342 lb 14.4 oz)   11/26/23 (!) 155.5 kg (342 lb 14.4 oz)      Goal weight per patient: no lower than 200#   Ideal Body Weight: 72.58 kg  Weight in (lb) to have BMI = 25: 183.9    Nutrition Risk Screening:   Food Insecurity: Food Insecurity Present (01/01/2023)    Hunger Vital Sign     Worried About Running Out of Food in the Last Year: Sometimes true     Ran Out of Food in the Last Year: Sometimes true      Nutrition Focused Physical Exam:   Nutrition Evaluation  Overall Impressions: Unable to perform Nutrition-Focused Physical Exam at this time due to (comment) (virtual visit) (12/29/23 1454)  Nutrition Designation: Obese class III  (BMI > 39.99 kg/m2) (12/29/23 1454)     Malnutrition Screening:   Patient does not meet AND/ASPEN criteria for malnutrition at this time (12/29/23 1454)  Biochemical Data, Medical Tests and Procedures:  All pertinent labs and imaging reviewed by Norman Clay, RD at 2:55 PM 12/28/2023.    Lab Results   Component Value Date    HGB 13.6 11/26/2023    HCT 40.7 11/26/2023    MCV 85.1 11/26/2023    IRON 56 11/26/2023    TIBC 356 11/26/2023    FERRITIN 104.3 11/26/2023    VITAMINB12 285 11/26/2023    FOLATE 7.5 11/26/2023    VITDTOTAL 44.1 11/26/2023     Lab Results   Component Value Date    ALKPHOS 204 (H) 12/17/2023    ALT 25 12/17/2023    AST 33 12/17/2023    BUN 21 12/17/2023    CREATININE 1.26 (H) 12/17/2023    GFRNONAA >=60 03/02/2018    GFRAA >=60 03/02/2018     Lab Results   Component Value Date    CHOL 215 (H) 07/31/2023    HDL 28 (L) 07/31/2023    LDL  07/31/2023      Comment:      Unable to calculate due to triglyceride greater than 400 mg/dL.    LDL 89.0 07/31/2023    NONHDL 187 (H) 07/31/2023    TRIG 721 (H) 07/31/2023    A1C 6.6 10/30/2023    TSH 1.027 11/26/2023     Medications and Vitamin/Mineral Supplementation:   All nutritionally pertinent medications reviewed on 12/28/2023  Nutritionally pertinent medications include: Coreg, Farxiga, Hydroxyzine, Insulin, Metformin, Mounjaro - 10 units, Zofran, Protonix, Pregablin, Torsemide, Trazodone  She is taking nutrition supplements. Calcium-carb, vitamin D3, ferrous sulfate    Current Outpatient Medications   Medication Sig Dispense Refill    acetaminophen (TYLENOL) 500 MG tablet Take 2 tablets (1,000 mg total) by mouth every six (6) hours as needed for pain. Extra strength tylenol      albuterol HFA 90 mcg/actuation inhaler Inhale 2 puffs every six (6) hours as needed. (Patient not taking: Reported on 12/21/2023) 8 g 0    alcohol swabs (ALCOHOL PADS) PadM Apply 1 Swab topically Three (3) times a day before meals. 100 each 3    blood sugar diagnostic (ACCU-CHEK GUIDE TEST STRIPS) Strp Check sugars before meals three times for insulin dependent type two diabetes. E11.65 100 each 11    blood-glucose sensor (DEXCOM G7 SENSOR) Devi Change sensor every 10 days. 3 each 11    bosutinib 500 mg Tab Take 1 tablet (500 mg total) by mouth once daily. Administer with food. Swallow tablet whole; do not cut, crush, break, or chew. 30 tablet 2    calcium carbonate-vitamin D3 600 mg-20 mcg (800 unit) Tab Take 1 mg by mouth Two (2) times a day (at 8am and 12:00).      carvedilol (COREG) 6.25 MG tablet TAKE ONE TABLET BY MOUTH EVERY MORNING AND ONE EVERY EVENING WITH MEALS 60 tablet 6    cetirizine (ZYRTEC) 10 MG tablet TAKE 1 TABLET BY MOUTH IN THE MORNING. 90 tablet 3    cholecalciferol, vitamin D3, (VITAMIN D3 ORAL) Take by mouth.      clonazePAM (KLONOPIN) 0.5 MG tablet Take 1 tablet (0.5 mg total) by mouth daily as needed. PRN      cyclobenzaprine (FLEXERIL) 5 MG tablet Take 1 tablet (5 mg total) by mouth Three (3) times a day as needed (lower back pain). 90 tablet 1    dapagliflozin propanediol (FARXIGA) 10 mg Tab tablet Take 1 tablet (10 mg total) by mouth every morning. 90 tablet 3    divalproex ER (DEPAKOTE ER) 500 MG extended released 24 hr tablet TAKE 1 TABLET BY MOUTH AT BEDTIME 90 tablet 3    ferrous sulfate 325 (65 FE) MG tablet Take 1 tablet (325 mg total) by mouth in the morning.      fluticasone propionate (FLONASE) 50 mcg/actuation nasal spray 2 sprays into each nostril daily. (Patient taking differently: 2 sprays into each nostril as needed.) 16 g 0    hydrOXYzine (VISTARIL) 50 MG capsule Take 2 capsules (100 mg total) by mouth nightly. And takes PRN (Patient taking differently: Take 4 capsules (200 mg total) by mouth nightly. And takes PRN)      insulin regular hum U-500 conc (HUMULIN R U-500, CONC, KWIKPEN) 500 unit/mL (3 mL) CONCENTRATED injection Inject 150 Units under the skin in the morning. 30 mL 3    lamoTRIgine (LAMICTAL) 150 MG tablet Take 1 tablet (150 mg total) by mouth two (2) times a day.      lancets (ACCU-CHEK SOFTCLIX LANCETS) Misc Check sugar three times per day before meals for insulin dependent type two diabetes.  E11.65 100 each 11    leuprolide acetate (LUPRON DEPOT IM) Inject into the muscle. Every 3 months      metFORMIN (GLUCOPHAGE) 1000 MG tablet TAKE ONE TABLET BY MOUTH TWICE DAILY IN THE MORNING AND IN THE EVENING. TAKE WITH MEALS 180 tablet 3    MOUNJARO 7.5 mg/0.5 mL PnIj INJECT 7.5MG  SUBCUTANEOUSLY EVERY 7 DAYS 2 mL 0    norethindrone (AYGESTIN) 5 mg tablet TAKE  ONE TABLET EVERY MORNING 30 tablet 11    ondansetron (ZOFRAN-ODT) 4 MG disintegrating tablet Take 1 tablet (4 mg total) by mouth every eight (8) hours as needed for nausea. 30 tablet 1    pantoprazole (PROTONIX) 40 MG tablet TAKE 1 TABLET BY MOUTH IN THE MORNING. 90 tablet 3    pregabalin (LYRICA) 75 MG capsule TAKE 1 CAPSULE BY MOUTH IN THE MORNING AND 2 CAPSULES BY MOUTH IN THE EVENING. 270 capsule 0    TECHLITE PEN NEEDLE 32 gauge x 1/4 (6 mm) Ndle USE 3 TIMES DAILY AS DIRECTED 300 each 4    [START ON 01/22/2024] tirzepatide (MOUNJARO) 10 mg/0.5 mL PnIj Inject 10 mg under the skin every seven (7) days for 4 doses. 2 mL 0    torsemide (DEMADEX) 20 MG tablet TAKE FOUR TABLETS BY MOUTH EVERY DAY 120 tablet 6    traZODone (DESYREL) 100 MG tablet TAKE 2 TABLETS BY MOUTH AT BEDTIME 180 tablet 3    ziprasidone (GEODON) 80 MG capsule Take 1 capsule (80 mg total) by mouth in the morning and 1 capsule (80 mg total) in the evening. Take with meals.       No current facility-administered medications for this visit.     Facility-Administered Medications Ordered in Other Visits   Medication Dose Route Frequency Provider Last Rate Last Admin    leuprolide (LUPRON) injection 11.25 mg  11.25 mg Intramuscular Once Coombs, Leta Speller, MD         Nutrition History:     Dietary Restrictions: No known food allergies. She noted food intolerance to milk.    Gastrointestinal Issues: Nausea Constipation HeartburnTakes Miralax and Protonix.     Hunger and Satiety: Patient does not endorse hunger with Mounjaro. Intake is very limited with Mounjaro.     Food Safety and Access: She has SNAP/EBT benefits.     Diet Recall:   Time Intake   Breakfast (10:30-11AM)    Snack (AM)    Lunch (12PM) Cabbage + kielbasa sausage + onions  Cauliflower rice with [place] and vegetables  egg+ chicken+onions   Snack (PM)    Dinner [employer] (kidney bean/black beans/ground [place]) OR protein pasta + meat sauce OR Healthy Choice Meal OR chicken pita   Snack (7PM) Apples/berries     Food-Related History:  Snacks: berries, apples, Ratio yogurt (15-18g) once  Beverages: SF grape juice, hint waters, SF juice, Mio drops, diet soda   Dining Out: usually [employer]  Cooking Methods: grilled, slow cooker  Meal Schedule:  1-2 meals OR 1 meal and 1 snack when on Mounjaro.         Eating Behaviors:  Overeating: Denied issues with overeating. Patient can barely tolerate a few bites.  Grazing: Denied issues.   Fast Eating:  Im trying to eat slow.   Nighttime Eating: None. Unless my sugar drops    Application of Bariatric Dietary Behaviors:  Not drinking with meals and waiting for 30 minutes after - progressing I've been trying to keep my water away  Focusing on protein rich foods at meals/snacks - not accomplished  Mindful eating - progressing  Eliminating carbonated/sugar sweetened beverages - progressing  Choosing a protein shake and a bariatric vitamins - not accomplished   Protein shake: Atkins cappuccino - 15g                             Bariatric Multivitamin: None selected    Physical Activity:  Physical  activity level is sedentary with little to no exercise.    Type: Walking, seated exercises              Duration: 6 min walk everyday    Daily Estimated Nutritional Needs:  Energy: 2355 kcals [Per [place]-Jeor Equation (AF of 1.0) using  , 153.95 kg (12/29/23 1454)]  Protein: 83-100 gm [1.0-1.2 gm/kg using  , 83.59 kg (BMI of 25) (12/29/23 1454)]  Carbohydrate:   [< / equal to 45% of kcal]  Fluid: *no fluid restriction according to patientMarijo File MD team]    Nutrition Goals & Evaluation      Demonstrate understanding of all bariatric nutrition/dietary behaviors  (Met and Ongoing)  Demonstrate behavioral change  (Not Met and Ongoing)  Meet nutritional needs (Not Met and Ongoing)  Successful application of  nutrition goals (Not Met and Ongoing)  Reduce weight by 0.5-1.0 lbs per week by next next nutrition visit (Met and Ongoing)    Nutrition goals reviewed, relevant barriers identified and addressed: none evident. She is evaluated to have good to fair willingness and ability to achieve nutrition goals.     Nutrition Assessment       The patient is currently not meeting estimated nutrition needs related to lowered intake produced by increased dosage of Mounjaro injection. Per anthropometric data, BMI: 46.03 kg/m2 classifies patient as obese (class III). EMR indicates a 3 lb weight loss since last RD visit potentially due to low intake from [facility]. January labs noted an elevated alkaline phosphatase with improved blood glucose readings over the month. She has not been successful with previous nutrition recommendations.  She is eating 1-2 meals and 1 snack per day. Patient expressed difficulty meeting goals with increased Mounjaro dosage, but feels she can get more in as days pass before following injection. Patient may benefit from incorporating a protein drink (or unflavored powder to drinks) and other low volume,  protein-rich items each day to meet daily protein goal. Patient is focused on protein at meals with carbohydrates coming from both starchy and non-starchy sources. Patient is encouraged to be mindful of sodium, fat, and simple carbohydrate sources to decrease adverse effects of conditions. Patient also is a selective eater would benefit from prioritizing protein and complex carb sources. Beverages are sugar free and mostly non-carbonated. She is practicing not drinking with meals, however, this remains a challenge. She is not participating in a regular exercise regimen. She has not selected an appropriate bariatric multivitamin.     Bariatric Surgery Assessment: RD is withholding assessment for insurance approval for weight loss surgery pending patient follow-up where success with plan goals and food record/usual intake/24-hour recall will be assessed.        Nutrition Intervention      - Nutrition Education: Bariatric dietary and lifestyle habits. Education resource(s) or methods provided:: After visit summary with patient instructions     Nutrition Plan:   Please see patient instructions.    Follow up will occur in 1 month.     Food/Nutrition-related history, Anthropometric measurements, Biochemical data, medical tests, procedures, Patient understanding or compliance with intervention and recommendations , and Effectiveness of nutrition interventions will be assessed at time of follow-up.     Recommendations for Clinical Team:  Continue to recommend following a consistent meal/snack schedule with a focus on protein. Emphasize a variety of exercise (aerobic, strength, stretching and balance) outside of daily routine should also be encouraged for 150-300 minutes per week.       Patient seen  according to established protocol for bariatric care.       The patient reports they are physically located in [place] and is currently: at home. I conducted a audio/video visit. I spent  78m 04s on the video call with the patient. I spent an additional 48 minutes on pre- and post-visit activities on the date of service .     I am located on-site and the patient is located off-site for this visit.

## 2023-12-28 NOTE — Unmapped (Unsigned)
Prep for bariatric surgery wt down to 339 (about 50 pound wt loss) almost down to all time low    Upper endo essentially normal

## 2023-12-30 DIAGNOSIS — C9210 Chronic myeloid leukemia, BCR/ABL-positive, not having achieved remission: Principal | ICD-10-CM

## 2023-12-30 NOTE — Unmapped (Addendum)
GOALS:    1.  Meal schedule: remember to eat protein every 3-4 hours    - include protein at every meal and every snack    - include protein drink everyday to help meet protein goal     Mounjaro early days after injection  11:00am: breakfast - include protein (I.e., protein drink to sip on throughout day)   2:00pm: protein based snack (I.e., ratio yogurt)   6:00pm: dinner - protein + non-starchy vegetables    Several days after injection when you have more capacity  11:00am: breakfast - include protein (I.e., protein drink, yogurt, eggs,  etc.)   2:00pm: lunch - protein + non-starchy vegetables (I.e., ground [place], beans, protein pasta,   etc.)   4:00pm: protein based snack (I.e., protein drink, yogurt, cheese, etc.)   6:00pm: dinner - protein + non-starchy vegetables (I.e., high protein Healthy Choice meal,   chicken, etc.)    PROTEIN OPTIONS: chicken/beef/pork, fish/tuna, eggs, cheese, cottage cheese, [place] yogurt (Ratio, Fage, Oikos, etc.), deli meat, protein shakes, beans, nuts/seeds, peanut butter   * Nuts and peanut butter are restricted to one serving once per day.    1/4 cup nuts OR 2 Tbsp peanut butter    15g of protein equals...  - 2 oz chicken/beef/pork  - 2 oz fish/tuna  - 2 eggs  - 2oz cheese  - 5 oz cottage cheese,   - 6 oz [place] yogurt (some options contain more),  - ~1/2 protein shakes (depending on size)  - 1cup beans,  - 4 Tbsp peanut butter or 1/2 cup nuts              * Nuts and peanut butter are restricted to one serving once per day.                          1/4 cup nuts OR 2 Tbsp peanut butter     Feel free to mix and match options to make 15g and aim for 4 options per day.    2.  Practice not drinking at meals and waiting for 30 minutes after meals to start drinking again.    3.  Research and try protein shakes.   - Protein shakes can be made at home using a whey protein powder + water or low fat milk or an   unsweetened milk alternative    - do not add frozen fruit, bananas or peanut butter to homemade protein shakes   - Premade protein shakes should have at least 20g of protein and carbs/sugars should be in the   single digits    - options include: Isopure unflavored OR flavored options, Genepro unflavored, etc.    - be sure to check the nutrition label on any product you choose    4.  Your next follow-up visit will determine if you are ready for the nutrition class.

## 2023-12-30 NOTE — Unmapped (Deleted)
Prep for bariatric surgery wt down to 339 (about 50 pound wt loss) almost down to all time low    Upper endo essentially normal    1. Chronic Kidney Disease  Labs today: Cr 1.26, GFR 58, urine ACR undetectable, urine PCR 518 mg/g creatinine     The patient has G3aA1 chronic kidney disease.  She has had consistently decreased kidney function since about 2022, and prior to then has had episodes of acute kidney injury that have resolved.  The cause of her kidney disease is unclear, but risk factors include diabetes mellitus, a history of urinary retention, and obesity.  Interestingly, she does not have evidence of diabetic retinopathy, and has no albuminuria.  [name] is not a common risk factor for chronic kidney disease; she has modest amount of proteinuria without albuminuria.  Her urine microscopy does not reveal any acute process to explain her kidney disease.     Given her young age, she is at high risk for progression to end-stage kidney disease, though not in the foreseeable future (2-5 years).  She also has a family history of end-stage kidney disease, likely signifying the inheritance of the gene that increases her baseline risk of kidney failure.  We discussed strategies to delay progression of her chronic kidney disease.  This includes blood glucose control with a goal hemoglobin A1c of less than 6%, blood pressure control, and avoidance of any nephrotoxic agents such as NSAIDs.  This includes discussion of any new medications with her care team to mitigate any possible nephrotoxicity.      She is being evaluated for bariatric surgery for her obesity.  I think this is a good idea medically, as that will improve her overall health, and indirectly improve her kidney health through blood pressure and blood glucose control.     2. Hypercalcemia  Labs today: Ca10.6  Albumin 3.8 PTH 64  The patient has mild hypercalcemia, likely as result of Tums and vitamin D use.     3.  Heart failure with preserved ejection fraction (HFpEF)  She is followed by cardiology.  She had tried a course of spironolactone, but because of hyperkalemia it was discontinued.     4.  Obesity  Patient has longstanding obesity, and is being considered for bariatric surgery.  In my professional medical opinion, I think this would be beneficial for her with respect to improve cardiovascular health, diabetes control, and blood pressure control.  The blood sugar and blood pressure control will indirectly benefit kidney health.     5.  Chronic myelogenous leukemia ([name])  Diagnosed in 2022, and followed by oncology at [facility].     6.  Diabetes mellitus  She has over 10-year history of diabetes mellitus.  She is currently on Humulin insulin, metformin, and Farxiga with a blood glucose control 6.5%.      Plan: I discussed the condition and treatment options with the patient. I advised the patient to discontinue applying lotion to her feet. I recommend she use an antifungal shake powder in her shoes daily indefinitely. I performed a diabetic foot exam on the patient today and recommend she begin wearing supportive shoes at all times while ambulating. Rx Econazole cream to be applied to the plantar foot twice daily for 1 year. I advised the patient to begin filing the thickness of the toenails weekly. Rx Penlac to be applied daily for one year and removed weekly with acetone. I advised the patient to return to the office as needed.  Rapid weight loss  -     Vitamin B12 Level  -     Folate Level  -     TSH  -     Comprehensive Metabolic Panel  -     Magnesium Level  -     Zinc Level, Serum; Future  -     Copper, Serum; Future  -     Vitamin B1, Whole Blood; Future  -     Ferritin  -     Iron & TIBC  -     Parathyroid Hormone (PTH); Future     Type 2 diabetes mellitus with diabetic neuropathy, with long-term current use of insulin (CMS-HCC  Diabetes is under much better control however concerned that [facility] may be causing some of patient's symptoms.  Will check bariatrics labs to assess patient's nutritional status.  She has had a dramatic weight loss within the past few months on Mounjaro.  Consider decreasing dose to 5.0 weekly.  Additionally patient is on insulin (dose decreased by endocrine).  Metformin and Farxiga.  Prescription written for ondansetron for nausea.  Patient is okay taking MiraLAX daily for constipation.  Patient is due for eye exam.     Bipolar I disorder: With psychotic features, Current or most recent episode depressed, with mixed features (CMS-HCC)  Still marked depressive symptoms however patient feels that overall she is doing much better.  Seems like she is getting along well with her mother.  Continue current management.  Patient has follow-up with psychiatry and therapist.- Continued home meds hydroxyzine, lamictal, depakote, trazodone, geodon, lyrica.      Class 3 severe obesity due to excess calories with serious comorbidity and body mass index (BMI) of 45.0 to 49.9 in adult (CMS-HCC)  Assessment & Plan:  Weight markedly improved with Mounjaro.  Continue working independently to reduce calories through portion control and careful food selection. Continue following up with weight management clinic. Recommend taking brisk walks in the mornings or evenings.  Will check nutritional labs given patient's rapid weight loss.     [name] (chronic myelocytic leukemia) (CMS-HCC)  Management as per heme-onc.  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 She started imatinib on 03/27/2021 but her BCR-ABL did not show improvement after 3 months and remained >10%. She switched to bosutinib 500 mg daily on 07/12/21 and has since achieved MMR.  Patient stable on current regimen.  Takes Basutinib 500 daily.        Schizo affective schizophrenia (CMS-HCC)  Assessment & Plan:  The condition is stable based upon today's assessment.  Under treatment or active surveillance by appropriate specialist.      Heart failure with preserved ejection fraction, unspecified HF chronicity (CMS-HCC)  The condition is stable based upon today's assessment.  Under treatment or active surveillance by appropriate specialist.        Pulmonary hypertension (CMS-HCC)  The condition is stable based upon today's assessment.  Under treatment or active surveillance by appropriate specialist.      Brain atrophy (CMS-HCC)  Noted on scan of brain.  Referred to neurology.  Do not see that scheduled.  Will address with patient at follow-up.

## 2023-12-31 ENCOUNTER — Ambulatory Visit
Admit: 2023-12-31 | Discharge: 2024-01-01 | Payer: PRIVATE HEALTH INSURANCE | Attending: Geriatric Medicine | Primary: Geriatric Medicine

## 2023-12-31 DIAGNOSIS — E66813 Class 3 severe obesity due to excess calories with serious comorbidity and body mass index (BMI) of 45.0 to 49.9 in adult (CMS-HCC): Principal | ICD-10-CM

## 2023-12-31 DIAGNOSIS — N1831 Chronic renal impairment, stage 3a (CMS-HCC): Principal | ICD-10-CM

## 2023-12-31 DIAGNOSIS — Z6841 Body Mass Index (BMI) 40.0 and over, adult: Principal | ICD-10-CM

## 2023-12-31 DIAGNOSIS — G319 Degenerative disease of nervous system, unspecified: Principal | ICD-10-CM

## 2023-12-31 DIAGNOSIS — R7989 Other specified abnormal findings of blood chemistry: Principal | ICD-10-CM

## 2023-12-31 DIAGNOSIS — I10 Essential (primary) hypertension: Principal | ICD-10-CM

## 2023-12-31 DIAGNOSIS — E1140 Type 2 diabetes mellitus with diabetic neuropathy, unspecified: Principal | ICD-10-CM

## 2023-12-31 DIAGNOSIS — Z794 Long term (current) use of insulin: Principal | ICD-10-CM

## 2023-12-31 DIAGNOSIS — F315 Bipolar disorder, current episode depressed, severe, with psychotic features: Principal | ICD-10-CM

## 2023-12-31 DIAGNOSIS — E1165 Type 2 diabetes mellitus with hyperglycemia: Principal | ICD-10-CM

## 2023-12-31 DIAGNOSIS — C9210 Chronic myeloid leukemia, BCR/ABL-positive, not having achieved remission: Principal | ICD-10-CM

## 2023-12-31 DIAGNOSIS — F259 Schizoaffective disorder, unspecified: Principal | ICD-10-CM

## 2023-12-31 DIAGNOSIS — I5030 Unspecified diastolic (congestive) heart failure: Principal | ICD-10-CM

## 2023-12-31 DIAGNOSIS — I272 Pulmonary hypertension, unspecified: Principal | ICD-10-CM

## 2023-12-31 DIAGNOSIS — L601 Onycholysis: Principal | ICD-10-CM

## 2023-12-31 DIAGNOSIS — E119 Type 2 diabetes mellitus without complications: Principal | ICD-10-CM

## 2023-12-31 MED ORDER — DEXCOM G7 SENSOR DEVICE
3 refills | 0.00 days | Status: CP
Start: 2023-12-31 — End: ?

## 2023-12-31 MED ORDER — LANCETS
11 refills | 0.00 days | Status: CP
Start: 2023-12-31 — End: ?

## 2023-12-31 MED ORDER — ACCU-CHEK GUIDE TEST STRIPS
11 refills | 0.00 days | Status: CP
Start: 2023-12-31 — End: ?

## 2023-12-31 MED ORDER — ALCOHOL SWABS
Freq: Three times a day (TID) | TOPICAL | 3 refills | 0.00 days | Status: CP
Start: 2023-12-31 — End: ?

## 2023-12-31 MED ADMIN — cyanocobalamin (vitamin B-12) injection 1,000 mcg: 1000 ug | INTRAMUSCULAR | @ 21:00:00 | Stop: 2023-12-31

## 2023-12-31 NOTE — Unmapped (Addendum)
Seen by podiatry with the following a/p: I discussed the condition and treatment options with the patient. I advised the patient to discontinue applying lotion to her feet. I recommend she use an antifungal shake powder in her shoes daily indefinitely. I performed a diabetic foot exam on the patient today and recommend she begin wearing supportive shoes at all times while ambulating. Rx Econazole cream to be applied to the plantar foot twice daily for 1 year. I advised the patient to begin filing the thickness of the toenails weekly. Rx Penlac to be applied daily for one year and removed weekly with acetone. I advised the patient to return to the office as needed.

## 2023-12-31 NOTE — Unmapped (Addendum)
Still marked depressive symptoms however patient feels that overall she is doing much better.  Seems like she is getting along well with her mother.  Continue current management.  Patient has follow-up with psychiatry and therapist.- Continued home meds hydroxyzine, lamictal, depakote, trazodone, geodon, lyrica.

## 2023-12-31 NOTE — Unmapped (Signed)
Patient ID: Aalyssa Elderkin is a 33 y.o. female who presents for follow up of ongoing medical problems.  Informant: Patient is accompanied by her mother.  Assessment/Plan:     Assessment & Plan  Type 2 diabetes mellitus with diabetic neuropathy, with long-term current use of insulin (CMS-HCC)  Diabetes is under much better control. She has had dramatic weight loss within the past few months on Mounjaro. Currently on Humulin 100 units daily, Mounjaro 10 mg weekly, Farxiga 5 mg daily, and metformin 1000 mg twice daily. She is gradually decreasing use of insulin and increasing Mounjaro. Tolerating current regimen fairly well with only one recent hypoglycemic episode noted. Patient is due for eye exam.   Orders:    blood-glucose sensor (DEXCOM G7 SENSOR) Devi; Change sensor every 10 days.    alcohol swabs (ALCOHOL PADS) PadM; Apply 1 Swab topically Three (3) times a day before meals.    blood sugar diagnostic (ACCU-CHEK GUIDE TEST STRIPS) Strp; Check sugars before meals three times for insulin dependent type two diabetes. E11.65    lancets (ACCU-CHEK SOFTCLIX LANCETS) Misc; Check sugar three times per day before meals for insulin dependent type two diabetes.  E11.65    Low vitamin B12 level  Patient reports longstanding hx of low vitamin B12. She has taken injections in the past but reports previously not tolerating them well. Symptoms at the time may have been related to other medical conditions. Patient and her oncology team are comfortable with her receiving a B12 injection in clinic today. Suspect this may help with overall fatigue.  Orders:    cyanocobalamin (vitamin B-12) injection 1,000 mcg    Class 3 severe obesity due to excess calories with serious comorbidity and body mass index (BMI) of 45.0 to 49.9 in adult (CMS-HCC)  Weight is currently improving nicely. Current BMI of 45.57. Patient continues preparation for bariatric surgery. She has met with a nutritionist. No new sleep study is needed. We reviewed most recent set of labs. Continue to work independently to reduce calories through portion control and careful food selection. Encouraged regular physical activity as able.  As per nephrology:Patient has longstanding obesity, and is being considered for bariatric surgery.  In my professional medical opinion, I think this would be beneficial for her with respect to improve cardiovascular health, diabetes control, and blood pressure control.  The blood sugar and blood pressure control will indirectly benefit kidney health.       Heart failure with preserved ejection fraction, unspecified HF chronicity (CMS-HCC)  The condition is stable based upon today's assessment.  Under treatment or active surveillance by appropriate specialist. She is followed by cardiology.  She had tried a course of spironolactone, but because of hyperkalemia it was discontinued.           Pulmonary hypertension (CMS-HCC)  The condition is stable based upon today's assessment.  Under treatment or active surveillance by appropriate specialist.        Brain atrophy (CMS-HCC)  Noted on scan of brain.  Referred to neurology.  Do not see that scheduled.  Will address with patient at follow-up.        [name] (chronic myelocytic leukemia) (CMS-HCC)  Management as per heme-onc.  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 She started imatinib on 03/27/2021 but her BCR-ABL did not show improvement after 3 months and remained >10%. She switched to bosutinib 500 mg daily on 07/12/21 and has since achieved MMR.  Patient stable on current regimen.  Takes Basutinib 500 daily.  Schizo affective schizophrenia (CMS-HCC)         Bipolar I disorder: With psychotic features, Current or most recent episode depressed, with mixed features (CMS-HCC)  Still marked depressive symptoms however patient feels that overall she is doing much better.  Seems like she is getting along well with her mother.  Continue current management.  Patient has follow-up with psychiatry and therapist.- Continued home meds hydroxyzine, lamictal, depakote, trazodone, geodon, lyrica.          Essential (primary) hypertension  Blood pressure is at goal below 130/80. Continue carvedilol 6.25 mg BID and torsemide 80 mg daily. Encouraged to continue monitoring with cardiology.         Onycholysis of toenail  Seen by podiatry with the following a/p: I discussed the condition and treatment options with the patient. I advised the patient to discontinue applying lotion to her feet. I recommend she use an antifungal shake powder in her shoes daily indefinitely. I performed a diabetic foot exam on the patient today and recommend she begin wearing supportive shoes at all times while ambulating. Rx Econazole cream to be applied to the plantar foot twice daily for 1 year. I advised the patient to begin filing the thickness of the toenails weekly. Rx Penlac to be applied daily for one year and removed weekly with acetone. I advised the patient to return to the office as needed.          Chronic renal impairment, stage 3a (CMS-HCC)    Labs today: Cr 1.26, GFR 58, urine ACR undetectable, urine PCR 518 mg/g creatinine   The patient has G3aA1 chronic kidney disease.  She has had consistently decreased kidney function since about 2022, and prior to then has had episodes of acute kidney injury that have resolved.  The cause of her kidney disease is unclear, but risk factors include diabetes mellitus, a history of urinary retention, and obesity.  Interestingly, she does not have evidence of diabetic retinopathy, and has no albuminuria.  [name] is not a common risk factor for chronic kidney disease; she has modest amount of proteinuria without albuminuria.  Her urine microscopy does not reveal any acute process to explain her kidney disease.   Given her young age, she is at high risk for progression to end-stage kidney disease, though not in the foreseeable future (2-5 years).  She also has a family history of end-stage kidney disease, likely signifying the inheritance of the gene that increases her baseline risk of kidney failure.  We discussed strategies to delay progression of her chronic kidney disease.  This includes blood glucose control with a goal hemoglobin A1c of less than 6%, blood pressure control, and avoidance of any nephrotoxic agents such as NSAIDs.  This includes discussion of any new medications with her care team to mitigate any possible nephrotoxicity.            Preventive services addressed today  We did not review preventive services today  -- Patient verbalized an understanding of today's assessment and recommendations, as well as the purpose of ongoing medications.  Return in about 3 months (around 03/29/2024).     Subjective:   HPI  This is a 33 y.o. female who presents for follow up of ongoing medical problems.    Patient plans to pursue bariatric surgery. She was seen by nutritionist. No need for another sleep study. She has not heard if her insurance will cover surgery yet although she continues preparation.    She was seen in the  ED on 11/24/23 for headache and emesis. Patient reports her symptoms have resolved. She is doing well on regimen of insulin and Mounjaro. Not currently using sliding scale. Currently on Humulin 100 units daily and Mounjaro 10 mg weekly. She notes one hypoglycemic episode. Patient states she is also taking Farxiga 5 mg daily. With recent weight loss, patient is feeling much less SOB and joint pain. She is trying to exercise more frequently.    She does not have monthly menstrual periods as she is currently on Lupron. LMP was several years ago. Reports periods were very heavy prior to this.    Patient took B12 injections in the past. She did not tolerate injections very well. Reports having fatigue although unsure if this was related to other medical conditions at the time.    She is hoping to eventually have a child. Patient met with the fertility clinic and she was told she would be a good candidate for IVF.     ROS  A comprehensive review of systems was conducted with negative results except as noted in the HPI above.          Objective:   Vital Signs  BP 106/66 (BP Site: L Arm, BP Position: Sitting, BP Cuff Size: Large)  - Pulse 74  - Wt (!) 152.4 kg (336 lb)  - SpO2 98%  - BMI 45.57 kg/m??  Body mass index is 45.57 kg/m??.  Exam  Physical Exam  Vitals and nursing note reviewed.   Constitutional:       Appearance: Normal appearance.   HENT:      Head: Normocephalic and atraumatic.   Cardiovascular:      Rate and Rhythm: Normal rate and regular rhythm.      Pulses: Normal pulses.      Heart sounds: Normal heart sounds.   Pulmonary:      Effort: Pulmonary effort is normal.      Breath sounds: Normal breath sounds. No wheezing, rhonchi or rales.   Musculoskeletal:      Right lower leg: No edema.      Left lower leg: No edema.   Skin:     General: Skin is warm.   Neurological:      General: No focal deficit present.      Mental Status: She is alert. Mental status is at baseline.   Psychiatric:         Mood and Affect: Mood normal.         Behavior: Behavior normal.         Thought Content: Thought content normal.         I've personally reviewed and summarized records in [facility] and via CareEverywhere as well as medication, allergies, past medical, social, and family history.   I have reviewed the patient's medical history in detail and updated the computerized patient record.    Note - This chart has been prepared using the Dragon voice recognition system. Typographical errors may have occurred.  Attempts have been made to correct errors, however, inadvertent errors may persist.     I attest that I, Mardi Mainland, personally documented this note while acting as scribe for Kaniyah Lisby Joeseph Amor, MD.      Mardi Mainland, Scribe.  12/31/2023     The documentation recorded by the scribe accurately reflects the service I personally performed and the decisions made by me.    Maxden Naji Joeseph Amor, MD

## 2023-12-31 NOTE — Unmapped (Addendum)
 The condition is stable based upon today's assessment.  Under treatment or active surveillance by appropriate specialist.

## 2023-12-31 NOTE — Unmapped (Addendum)
Diabetes is under much better control. She has had dramatic weight loss within the past few months on Mounjaro. Currently on Humulin 100 units daily, Mounjaro 10 mg weekly, Farxiga 5 mg daily, and metformin 1000 mg twice daily. She is gradually decreasing use of insulin and increasing Mounjaro. Tolerating current regimen fairly well with only one recent hypoglycemic episode noted. Patient is due for eye exam.   Orders:    blood-glucose sensor (DEXCOM G7 SENSOR) Devi; Change sensor every 10 days.    alcohol swabs (ALCOHOL PADS) PadM; Apply 1 Swab topically Three (3) times a day before meals.    blood sugar diagnostic (ACCU-CHEK GUIDE TEST STRIPS) Strp; Check sugars before meals three times for insulin dependent type two diabetes. E11.65    lancets (ACCU-CHEK SOFTCLIX LANCETS) Misc; Check sugar three times per day before meals for insulin dependent type two diabetes.  E11.65

## 2023-12-31 NOTE — Unmapped (Signed)
 The condition is stable based upon today's assessment.  Under treatment or active surveillance by appropriate specialist.

## 2023-12-31 NOTE — Unmapped (Addendum)
Noted on scan of brain.  Referred to neurology.  Do not see that scheduled.  Will address with patient at follow-up.

## 2023-12-31 NOTE — Unmapped (Signed)
Management as per heme-onc.  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 She started imatinib on 03/27/2021 but her BCR-ABL did not show improvement after 3 months and remained >10%. She switched to bosutinib 500 mg daily on 07/12/21 and has since achieved MMR.  Patient stable on current regimen.  Takes Basutinib 500 daily.

## 2023-12-31 NOTE — Unmapped (Addendum)
Weight is currently improving nicely. Current BMI of 45.57. Patient continues preparation for bariatric surgery. She has met with a nutritionist. No new sleep study is needed. We reviewed most recent set of labs. Continue to work independently to reduce calories through portion control and careful food selection. Encouraged regular physical activity as able.  As per nephrology:Patient has longstanding obesity, and is being considered for bariatric surgery.  In my professional medical opinion, I think this would be beneficial for her with respect to improve cardiovascular health, diabetes control, and blood pressure control.  The blood sugar and blood pressure control will indirectly benefit kidney health.

## 2023-12-31 NOTE — Unmapped (Signed)
Patient reports longstanding hx of low vitamin B12. She has taken injections in the past but reports previously not tolerating them well. Symptoms at the time may have been related to other medical conditions. Patient and her oncology team are comfortable with her receiving a B12 injection in clinic today. Suspect this may help with overall fatigue.  Orders:    cyanocobalamin (vitamin B-12) injection 1,000 mcg

## 2023-12-31 NOTE — Unmapped (Addendum)
The condition is stable based upon today's assessment.  Under treatment or active surveillance by appropriate specialist. She is followed by cardiology.  She had tried a course of spironolactone, but because of hyperkalemia it was discontinued.

## 2023-12-31 NOTE — Unmapped (Addendum)
Blood pressure is at goal below 130/80. Continue carvedilol 6.25 mg BID and torsemide 80 mg daily. Encouraged to continue monitoring with cardiology.

## 2023-12-31 NOTE — Unmapped (Addendum)
Labs today: Cr 1.26, GFR 58, urine ACR undetectable, urine PCR 518 mg/g creatinine   The patient has G3aA1 chronic kidney disease.  She has had consistently decreased kidney function since about 2022, and prior to then has had episodes of acute kidney injury that have resolved.  The cause of her kidney disease is unclear, but risk factors include diabetes mellitus, a history of urinary retention, and obesity.  Interestingly, she does not have evidence of diabetic retinopathy, and has no albuminuria.  [name] is not a common risk factor for chronic kidney disease; she has modest amount of proteinuria without albuminuria.  Her urine microscopy does not reveal any acute process to explain her kidney disease.   Given her young age, she is at high risk for progression to end-stage kidney disease, though not in the foreseeable future (2-5 years).  She also has a family history of end-stage kidney disease, likely signifying the inheritance of the gene that increases her baseline risk of kidney failure.  We discussed strategies to delay progression of her chronic kidney disease.  This includes blood glucose control with a goal hemoglobin A1c of less than 6%, blood pressure control, and avoidance of any nephrotoxic agents such as NSAIDs.  This includes discussion of any new medications with her care team to mitigate any possible nephrotoxicity.

## 2024-01-03 DIAGNOSIS — C9210 Chronic myeloid leukemia, BCR/ABL-positive, not having achieved remission: Principal | ICD-10-CM
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
## 2024-01-05 DIAGNOSIS — C9210 Chronic myeloid leukemia, BCR/ABL-positive, not having achieved remission: Principal | ICD-10-CM

## 2024-01-05 NOTE — Unmapped (Addendum)
 [facility] Clinical Assessment & Refill Coordination Note    Jenna Mosley, DOB: 1991-01-14  Phone: [phone] (home)     All above HIPAA information was verified with patient.     Was a Nurse, learning disability used for this call? No    Specialty Medication(s):   Hematology/Oncology: Bosulif and NA     Current Outpatient Medications   Medication Sig Dispense Refill    acetaminophen (TYLENOL) 500 MG tablet Take 2 tablets (1,000 mg total) by mouth every six (6) hours as needed for pain. Extra strength tylenol      albuterol HFA 90 mcg/actuation inhaler Inhale 2 puffs every six (6) hours as needed. 8 g 0    alcohol swabs (ALCOHOL PADS) PadM Apply 1 Swab topically Three (3) times a day before meals. 100 each 3    blood sugar diagnostic (ACCU-CHEK GUIDE TEST STRIPS) Strp Check sugars before meals three times for insulin dependent type two diabetes. E11.65 100 each 11    blood-glucose sensor (DEXCOM G7 SENSOR) Devi Change sensor every 10 days. 9 each 3    bosutinib 500 mg Tab Take 1 tablet (500 mg total) by mouth once daily. Administer with food. Swallow tablet whole; do not cut, crush, break, or chew. 30 tablet 2    calcium carbonate-vitamin D3 600 mg-20 mcg (800 unit) Tab Take 1 mg by mouth Two (2) times a day (at 8am and 12:00).      carvedilol (COREG) 6.25 MG tablet TAKE ONE TABLET BY MOUTH EVERY MORNING AND ONE EVERY EVENING WITH MEALS 60 tablet 6    cetirizine (ZYRTEC) 10 MG tablet TAKE 1 TABLET BY MOUTH IN THE MORNING. 90 tablet 3    cholecalciferol, vitamin D3, (VITAMIN D3 ORAL) Take by mouth.      clonazePAM (KLONOPIN) 0.5 MG tablet Take 1 tablet (0.5 mg total) by mouth daily as needed. PRN      cyclobenzaprine (FLEXERIL) 5 MG tablet Take 1 tablet (5 mg total) by mouth Three (3) times a day as needed (lower back pain). 90 tablet 1    dapagliflozin propanediol (FARXIGA) 10 mg Tab tablet Take 1 tablet (10 mg total) by mouth every morning. 90 tablet 3    divalproex ER (DEPAKOTE ER) 500 MG extended released 24 hr tablet TAKE 1 TABLET BY MOUTH AT BEDTIME 90 tablet 3    ferrous sulfate 325 (65 FE) MG tablet Take 1 tablet (325 mg total) by mouth in the morning.      fluticasone propionate (FLONASE) 50 mcg/actuation nasal spray 2 sprays into each nostril daily. (Patient taking differently: 2 sprays into each nostril as needed.) 16 g 0    hydrOXYzine (VISTARIL) 50 MG capsule Take 2 capsules (100 mg total) by mouth nightly. And takes PRN (Patient taking differently: Take 4 capsules (200 mg total) by mouth nightly. And takes PRN)      insulin regular hum U-500 conc (HUMULIN R U-500, CONC, KWIKPEN) 500 unit/mL (3 mL) CONCENTRATED injection Inject 150 Units under the skin in the morning. (Patient taking differently: Inject 100 Units under the skin in the morning.) 30 mL 3    lamoTRIgine (LAMICTAL) 150 MG tablet Take 1 tablet (150 mg total) by mouth two (2) times a day.      lancets (ACCU-CHEK SOFTCLIX LANCETS) Misc Check sugar three times per day before meals for insulin dependent type two diabetes.  E11.65 100 each 11    leuprolide acetate (LUPRON DEPOT IM) Inject into the muscle. Every 3 months  metFORMIN (GLUCOPHAGE) 1000 MG tablet TAKE ONE TABLET BY MOUTH TWICE DAILY IN THE MORNING AND IN THE EVENING. TAKE WITH MEALS 180 tablet 3    MOUNJARO 7.5 mg/0.5 mL PnIj INJECT 7.5MG  SUBCUTANEOUSLY EVERY 7 DAYS (Patient not taking: Reported on 12/31/2023) 2 mL 0    norethindrone (AYGESTIN) 5 mg tablet TAKE ONE TABLET EVERY MORNING 30 tablet 11    ondansetron (ZOFRAN-ODT) 4 MG disintegrating tablet Take 1 tablet (4 mg total) by mouth every eight (8) hours as needed for nausea. 30 tablet 1    pantoprazole (PROTONIX) 40 MG tablet TAKE 1 TABLET BY MOUTH IN THE MORNING. 90 tablet 3    pregabalin (LYRICA) 75 MG capsule TAKE 1 CAPSULE BY MOUTH IN THE MORNING AND 2 CAPSULES BY MOUTH IN THE EVENING. 270 capsule 0    TECHLITE PEN NEEDLE 32 gauge x 1/4 (6 mm) Ndle USE 3 TIMES DAILY AS DIRECTED 300 each 4    [START ON 01/22/2024] tirzepatide (MOUNJARO) 10 mg/0.5 mL PnIj Inject 10 mg under the skin every seven (7) days for 4 doses. 2 mL 0    torsemide (DEMADEX) 20 MG tablet TAKE FOUR TABLETS BY MOUTH EVERY DAY 120 tablet 6    traZODone (DESYREL) 100 MG tablet TAKE 2 TABLETS BY MOUTH AT BEDTIME 180 tablet 3    ziprasidone (GEODON) 80 MG capsule Take 1 capsule (80 mg total) by mouth in the morning and 1 capsule (80 mg total) in the evening. Take with meals.       No current facility-administered medications for this visit.     Facility-Administered Medications Ordered in Other Visits   Medication Dose Route Frequency Provider Last Rate Last Admin    leuprolide (LUPRON) injection 11.25 mg  11.25 mg Intramuscular Once Coombs, Leta Speller, MD            Changes to medications: Ravneet reports no changes at this time.    Medication list has been reviewed and updated in Epic:  Harlow Mares, MD on 12/31/2023    Allergies   Allergen Reactions    Augmentin [Amoxicillin-Pot Clavulanate] Angioedema     Rash and angioedema    Lisinopril Shortness Of Breath     Other Reaction(s): chest pain    SOB, chest painSOB, chest pain    Naproxen Nausea Only, Palpitations and Other (See Comments)     Chest palpitations and feels like flying    Aripiprazole Other (See Comments)     Inability to urinate    Other Reaction(s): cannot void    Ciprofloxacin Other (See Comments)     Does not work    Fluphenazine      mental health problems    Metoclopramide Other (See Comments)     Mania    ManiaMania    Milk Containing Products (Dairy) Diarrhea and Other (See Comments)    Prednisone Other (See Comments)     mania    Reglan [Metoclopramide Hcl] Other (See Comments)     Induces mania    Turmeric Other (See Comments)    Diphenhydramine Hcl Anxiety     Other Reaction(s): mania    Promethazine Anxiety       Changes to allergies: No    Allergies have been reviewed and updated in Epic:  by Harlow Mares, MD on 12/31/2023    SPECIALTY MEDICATION ADHERENCE     Bosulif 500 mg: 0 days of medicine on hand     Are there any concerns with adherence? No  Adherence counseling provided? Not needed    Patient-Reported Symptoms Tracker for Cancer Patients on Oral Chemotherapy     Oral chemotherapy medication name(s): Bosulif  Dose and frequency: 500 mg once daily  Oral Chemotherapy Start Date:    Baseline? No  Clinic(s) visited: Hematology    Symptom Grouping Question Patient Response   Digestion and Eating Have you felt sick to your stomach? Denies    Had diarrhea? Denies    Constipated? Denies    Not wanting to eat? Denies    Comments      Sleep and Pain Felt very tired even after you rest? Denies    Pain due to cancer medication or cancer? Denies    Comments     Other Side Effects Numbness or tingling in hands and/or feet? Denies    Felt short of breath? Denies    Mouth or throat Sores? Denies    Rash? Denies    Palmar-plantar erythrodysesthesia syndrome?      Rash - acneiform?      Rash - maculo-papular?      How many days over the past month did your cancer medication or cancer keep you from your normal activities?  Write in number of days, 0-30:  0    Other side effects or things you would like to discuss?      Comments?     Adherence  In the last 30 days, on how many days did you miss at least one dose of any of your [drug name]? Write in number of days, 0-30:  0    What reasons are you having trouble taking your medication [pharmacist: check all that apply]? Specify chemotherapy cycle:        No problems identified    Comments:        Comments       Optional Symptom Tracking Comments:      CLINICAL MANAGEMENT AND INTERVENTION      Clinical Benefit Assessment:    Do you feel the medicine is effective or helping your condition? Yes    Clinical Benefit counseling provided? Not needed    Acute Infection Status:    Acute infections noted within Epic:  No active infections    Patient reported infection: None    Therapy Appropriateness:    Is therapy appropriate based on current medication list, adverse reactions, adherence, clinical benefit and progress toward achieving therapeutic goals?  Yes, therapy is appropriate and should be continued    DISEASE/MEDICATION-SPECIFIC INFORMATION      N/A    Is the patient receiving adequate infection prevention treatment? Not applicable    Does the patient have adequate nutritional support? Not applicable    PATIENT SPECIFIC NEEDS     Does the patient have any physical, cognitive, or cultural barriers? No    Is the patient high risk? No    Did the patient require a clinical intervention? No    Does the patient require physician intervention or other additional services (i.e., nutrition, smoking cessation, social work)? No    Does the patient have an additional or emergency contact listed in their chart? Yes    SOCIAL DETERMINANTS OF HEALTH     At the [facility], we have learned that life circumstances - like trouble affording food, housing, utilities, or transportation can affect the health of many of our patients.   That is why we wanted to ask: are you currently experiencing any life circumstances that are negatively impacting your health and/or  quality of life? Patient declined to answer    Social Drivers of Health     Food Insecurity: No Food Insecurity (12/31/2023)    Hunger Vital Sign     Worried About Running Out of Food in the Last Year: Never true     Ran Out of Food in the Last Year: Never true   Internet Connectivity: Not on file   Housing/Utilities: Low Risk  (12/31/2023)    Housing/Utilities     Within the past 12 months, have you ever stayed: outside, in a car, in a tent, in an overnight shelter, or temporarily in someone else's home (i.e. couch-surfing)?: No     Are you worried about losing your housing?: No     Within the past 12 months, have you been unable to get utilities (heat, electricity) when it was really needed?: No   Tobacco Use: Medium Risk (12/31/2023)    Patient History     Smoking Tobacco Use: Former Smokeless Tobacco Use: Never     Passive Exposure: Past   Transportation Needs: No Transportation Needs (12/31/2023)    PRAPARE - Therapist, art (Medical): No     Lack of Transportation (Non-Medical): No   Alcohol Use: Not At Risk (12/31/2023)    Alcohol Use     How often do you have a drink containing alcohol?: Never     How many drinks containing alcohol do you have on a typical day when you are drinking?: Not on file     How often do you have 5 or more drinks on one occasion?: Never   Interpersonal Safety: Not on file   Physical Activity: Not on file   Intimate Partner Violence: Not on file   Stress: Not on file   Substance Use: Low Risk  (12/31/2023)    Substance Use     In the past year, how often have you used prescription drugs for non-medical reasons?: Never     In the past year, how often have you used illegal drugs?: Never     In the past year, have you used any substance for non-medical reasons?: No   Social Connections: Not on file   Financial Resource Strain: Medium Risk (01/01/2023)    Overall Financial Resource Strain (CARDIA)     Difficulty of Paying Living Expenses: Somewhat hard   Depression: Not at risk (04/10/2021)    PHQ-2     PHQ-2 Score: 2   Recent Concern: Depression - At risk (04/02/2021)    PHQ-2     PHQ-2 Score: 3   Health Literacy: Medium Risk (05/14/2021)    Health Literacy     : Sometimes       Would you be willing to receive help with any of the needs that you have identified today? Not applicable       SHIPPING     Specialty Medication(s) to be Shipped:   Hematology/Oncology: Bosulif    Other medication(s) to be shipped: No additional medications requested for fill at this time     Changes to insurance: No    Delivery Scheduled: Yes, Expected medication delivery date: 01/05/24.     Medication will be delivered via Same Day Courier to the confirmed prescription address in [facility].    The patient will receive a drug information handout for each medication shipped and additional FDA Medication Guides as required.  Verified that patient has previously received a Conservation officer, historic buildings and a Surveyor, mining.  The patient or caregiver noted above participated in the development of this care plan and knows that they can request review of or adjustments to the care plan at any time.      All of the patient's questions and concerns have been addressed.    Kermit Balo, [facility]   [facility]  Pharmacist

## 2024-01-06 DIAGNOSIS — C9210 Chronic myeloid leukemia, BCR/ABL-positive, not having achieved remission: Principal | ICD-10-CM

## 2024-01-06 NOTE — Unmapped (Signed)
 [facility] Other Call ONC Phone Room Smart Lists: Cancellation/Reschedule -     Hi,    Patient Jenna Mosley contacted the Communication Center to reschedule their appointment for tomorrow.  The original appointment has been cancelled.    Cancellation Reason: Schedule conflict    Patient has been rescheduled for 01/11/24.    Thank you,  Sharl Ma  [facility]   [phone]    [facility]
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
## 2024-01-08 MED FILL — BOSULIF 500 MG TABLET: 30 days supply | Qty: 30 | Fill #1

## 2024-01-08 NOTE — Unmapped (Signed)
 Patient is requesting the following refill  Requested Prescriptions     Pending Prescriptions Disp Refills    tirzepatide (MOUNJARO) 10 mg/0.5 mL PnIj 2 mL 0     Sig: Inject 10 mg under the skin every seven (7) days for 4 doses.       Order pended. Please advise. Thanks    Last OV: 12/31/2023   Next OV: Visit date not found    Dr. Chanetta Marshall, please review dosage.  This is a future order for Mounjaro 46m/0.5ml

## 2024-01-08 NOTE — Unmapped (Signed)
 Jenna Mosley 's Bosulif shipment will be sent out as a result of credit card on file and copay collected.     I have spoken with the patient  at [phone]  and communicated the delivery change. We will reschedule the medication for the delivery date that the patient agreed upon.  We have confirmed the delivery date as 01/09/24, via next day courier.

## 2024-01-11 ENCOUNTER — Ambulatory Visit
Admit: 2024-01-11 | Discharge: 2024-01-12 | Payer: PRIVATE HEALTH INSURANCE | Attending: Student in an Organized Health Care Education/Training Program | Primary: Student in an Organized Health Care Education/Training Program

## 2024-01-11 ENCOUNTER — Other Ambulatory Visit: Admit: 2024-01-11 | Discharge: 2024-01-12 | Payer: PRIVATE HEALTH INSURANCE

## 2024-01-11 DIAGNOSIS — C9210 Chronic myeloid leukemia, BCR/ABL-positive, not having achieved remission: Principal | ICD-10-CM

## 2024-01-11 LAB — COMPREHENSIVE METABOLIC PANEL
ALBUMIN: 3.8 g/dL (ref 3.4–5.0)
ALKALINE PHOSPHATASE: 232 U/L — ABNORMAL HIGH (ref 46–116)
ALT (SGPT): 25 U/L (ref 10–49)
ANION GAP: 12 mmol/L (ref 5–14)
AST (SGOT): 37 U/L — ABNORMAL HIGH (ref <=34)
BILIRUBIN TOTAL: 0.3 mg/dL (ref 0.3–1.2)
BLOOD UREA NITROGEN: 16 mg/dL (ref 9–23)
BUN / CREAT RATIO: 19
CALCIUM: 10.4 mg/dL (ref 8.7–10.4)
CHLORIDE: 96 mmol/L — ABNORMAL LOW (ref 98–107)
CO2: 32 mmol/L — ABNORMAL HIGH (ref 20.0–31.0)
CREATININE: 0.85 mg/dL (ref 0.55–1.02)
EGFR CKD-EPI (2021) FEMALE: >90 mL/min/{1.73_m2} (ref >=60)
GLUCOSE RANDOM: 230 mg/dL — ABNORMAL HIGH (ref 70–179)
POTASSIUM: 3.9 mmol/L (ref 3.4–4.8)
PROTEIN TOTAL: 7.9 g/dL (ref 5.7–8.2)
SODIUM: 140 mmol/L (ref 135–145)

## 2024-01-11 LAB — CBC W/ AUTO DIFF
BASOPHILS ABSOLUTE COUNT: 0.1 10*9/L (ref 0.0–0.1)
BASOPHILS RELATIVE PERCENT: 0.7 %
EOSINOPHILS ABSOLUTE COUNT: 0.2 10*9/L (ref 0.0–0.5)
EOSINOPHILS RELATIVE PERCENT: 2.5 %
HEMATOCRIT: 39.6 % (ref 34.0–44.0)
HEMOGLOBIN: 13.4 g/dL (ref 11.3–14.9)
LYMPHOCYTES ABSOLUTE COUNT: 3.1 10*9/L (ref 1.1–3.6)
LYMPHOCYTES RELATIVE PERCENT: 40 %
MEAN CORPUSCULAR HEMOGLOBIN CONC: 33.8 g/dL (ref 32.0–36.0)
MEAN CORPUSCULAR HEMOGLOBIN: 28.8 pg (ref 25.9–32.4)
MEAN CORPUSCULAR VOLUME: 85.2 fL (ref 77.6–95.7)
MEAN PLATELET VOLUME: 8.4 fL (ref 6.8–10.7)
MONOCYTES ABSOLUTE COUNT: 0.4 10*9/L (ref 0.3–0.8)
MONOCYTES RELATIVE PERCENT: 5.4 %
NEUTROPHILS ABSOLUTE COUNT: 3.9 10*9/L (ref 1.8–7.8)
NEUTROPHILS RELATIVE PERCENT: 51.4 %
PLATELET COUNT: 253 10*9/L (ref 150–450)
RED BLOOD CELL COUNT: 4.64 10*12/L (ref 3.95–5.13)
RED CELL DISTRIBUTION WIDTH: 14.4 % (ref 12.2–15.2)
WBC ADJUSTED: 7.6 10*9/L (ref 3.6–11.2)

## 2024-01-11 MED ORDER — MOUNJARO 10 MG/0.5 ML SUBCUTANEOUS PEN INJECTOR
SUBCUTANEOUS | 3 refills | 0.00 days | Status: CP
Start: 2024-01-11 — End: 2025-01-15

## 2024-01-11 NOTE — Unmapped (Signed)
 Nurse notified pharmacist Kathe Becton) Dr. Chanetta Marshall to send in a new prescription for [facility].

## 2024-01-11 NOTE — Unmapped (Signed)
 [place], [facility] [phone] left a message. Patient is out of Mounjaro medication. Pharmacist stated the prescription for Mounjaro 10 mg is not to filled until March 7th per provider.  She has Mounjaro 7.5 mg on file at the pharmacy but this would be a decrease for her.  Please advise.

## 2024-01-12 DIAGNOSIS — C9210 Chronic myeloid leukemia, BCR/ABL-positive, not having achieved remission: Principal | ICD-10-CM

## 2024-01-13 DIAGNOSIS — C9210 Chronic myeloid leukemia, BCR/ABL-positive, not having achieved remission: Principal | ICD-10-CM

## 2024-01-14 DIAGNOSIS — C9210 Chronic myeloid leukemia, BCR/ABL-positive, not having achieved remission: Principal | ICD-10-CM

## 2024-01-15 NOTE — Unmapped (Signed)
 Addended by: [name] B on: 01/15/2024 11:28 AM     Modules accepted: Orders

## 2024-01-18 ENCOUNTER — Encounter: Admit: 2024-01-18 | Discharge: 2024-01-19 | Payer: PRIVATE HEALTH INSURANCE

## 2024-01-18 DIAGNOSIS — C9210 Chronic myeloid leukemia, BCR/ABL-positive, not having achieved remission: Principal | ICD-10-CM

## 2024-01-18 NOTE — Unmapped (Signed)
 [facility]   Medical Nutrition Therapy Consultation       Visit Type:    Return Assessment    Referral Reason:   Baraitric weight loss surgery    Jenna Mosley is a 33 y.o. female seen for medical nutrition therapy. Her active problem list, medication list, allergies, notes from last encounter, and lab results were reviewed.     Her interim medical history is significant for Reflux, Hypertension, Polycystic ovarian syndrome, vitamin B12 deficiency, Obesity, Type II Diabetes, Non-alcoholic steatohepatitis, intestinal malabsorption, Gastroparesis, Cow's milk intolerance, Shortness of breath, Hyperkalemia, Heart Failure, Chronic renal impairment.    Anthropometrics   Height: 182.9 cm (6')   Weight: (!) 152.1 kg (335 lb 5.1 oz)  Body mass index is 45.48 kg/m??.    Wt Readings from Last 5 Encounters:   01/19/24 (!) 152.1 kg (335 lb 5.1 oz)   01/11/24 (!) 152.1 kg (335 lb 5.1 oz)   12/31/23 (!) 152.4 kg (336 lb)   12/29/23 (!) 154 kg (339 lb 6.4 oz)   12/21/23 (!) 153.9 kg (339 lb 6.4 oz)      Goal weight per patient: no lower than 200#   Ideal Body Weight: 72.58 kg  Weight in (lb) to have BMI = 25: 183.9    Nutrition Risk Screening:   Food Insecurity: No Food Insecurity (12/31/2023)    Hunger Vital Sign     Worried About Running Out of Food in the Last Year: Never true     Ran Out of Food in the Last Year: Never true      Nutrition Focused Physical Exam:  Nutrition Evaluation  Overall Impressions: Unable to perform Nutrition-Focused Physical Exam at this time due to (comment) (per last recorded measurementa) (01/19/24 1054)  Nutrition Designation: Obese class III  (BMI > 39.99 kg/m2) (01/19/24 1054)     Malnutrition Screening:   Patient does not meet AND/ASPEN criteria for malnutrition at this time (01/19/24 1054)    Biochemical Data, Medical Tests and Procedures:  All pertinent labs and imaging reviewed by Norman Clay, RD at 2:32 PM 01/18/2024.    Lab Results   Component Value Date    HGB 13.4 01/11/2024    HCT 39.6 01/11/2024    MCV 85.2 01/11/2024    IRON 56 11/26/2023    TIBC 356 11/26/2023    FERRITIN 104.3 11/26/2023    VITAMINB12 285 11/26/2023    FOLATE 7.5 11/26/2023    VITDTOTAL 44.1 11/26/2023     Lab Results   Component Value Date    ALKPHOS 232 (H) 01/11/2024    ALT 25 01/11/2024    AST 37 (H) 01/11/2024    BUN 16 01/11/2024    CREATININE 0.85 01/11/2024    GFRNONAA >=60 03/02/2018    GFRAA >=60 03/02/2018     Lab Results   Component Value Date    CHOL 215 (H) 07/31/2023    HDL 28 (L) 07/31/2023    LDL  07/31/2023      Comment:      Unable to calculate due to triglyceride greater than 400 mg/dL.    LDL 89.0 07/31/2023    NONHDL 187 (H) 07/31/2023    TRIG 721 (H) 07/31/2023    A1C 6.6 10/30/2023    TSH 1.027 11/26/2023     Medications and Vitamin/Mineral Supplementation:   All nutritionally pertinent medications reviewed on 01/18/2024  Nutritionally pertinent medications include: Coreg, Farxiga, Hydroxyzine, Insulin, Metformin, Mounjaro - 10 units, Zofran, Protonix, Pregablin,  Torsemide, Trazodone  She is taking nutrition supplements. Calcium-carb, vitamin D3, ferrous sulfate   Sugars have been elevated d/t decrease in Humulin --> Humulin dosage has increased as well. Endocrinologist does not want fasting to be higher than 150mg /dL    Current Outpatient Medications   Medication Sig Dispense Refill    acetaminophen (TYLENOL) 500 MG tablet Take 2 tablets (1,000 mg total) by mouth every six (6) hours as needed for pain. Extra strength tylenol      albuterol HFA 90 mcg/actuation inhaler Inhale 2 puffs every six (6) hours as needed. 8 g 0    alcohol swabs (ALCOHOL PADS) PadM Apply 1 Swab topically Three (3) times a day before meals. 100 each 3    blood sugar diagnostic (ACCU-CHEK GUIDE TEST STRIPS) Strp Check sugars before meals three times for insulin dependent type two diabetes. E11.65 100 each 11    blood-glucose sensor (DEXCOM G7 SENSOR) Devi Change sensor every 10 days. 9 each 3    bosutinib 500 mg Tab Take 1 tablet (500 mg total) by mouth once daily. Administer with food. Swallow tablet whole; do not cut, crush, break, or chew. 30 tablet 2    calcium carbonate-vitamin D3 600 mg-20 mcg (800 unit) Tab Take 1 mg by mouth Two (2) times a day (at 8am and 12:00).      carvedilol (COREG) 6.25 MG tablet TAKE ONE TABLET BY MOUTH EVERY MORNING AND ONE EVERY EVENING WITH MEALS 60 tablet 6    cetirizine (ZYRTEC) 10 MG tablet TAKE 1 TABLET BY MOUTH IN THE MORNING. 90 tablet 3    cholecalciferol, vitamin D3, (VITAMIN D3 ORAL) Take by mouth.      clonazePAM (KLONOPIN) 0.5 MG tablet Take 1 tablet (0.5 mg total) by mouth daily as needed. PRN      cyclobenzaprine (FLEXERIL) 5 MG tablet Take 1 tablet (5 mg total) by mouth Three (3) times a day as needed (lower back pain). 90 tablet 1    dapagliflozin propanediol (FARXIGA) 10 mg Tab tablet Take 1 tablet (10 mg total) by mouth every morning. 90 tablet 3    divalproex ER (DEPAKOTE ER) 500 MG extended released 24 hr tablet TAKE 1 TABLET BY MOUTH AT BEDTIME 90 tablet 3    ferrous sulfate 325 (65 FE) MG tablet Take 1 tablet (325 mg total) by mouth in the morning.      fluticasone propionate (FLONASE) 50 mcg/actuation nasal spray 2 sprays into each nostril daily. (Patient taking differently: 2 sprays into each nostril as needed.) 16 g 0    hydrOXYzine (VISTARIL) 50 MG capsule Take 2 capsules (100 mg total) by mouth nightly. And takes PRN (Patient taking differently: Take 4 capsules (200 mg total) by mouth nightly. And takes PRN)      insulin regular hum U-500 conc (HUMULIN R U-500, CONC, KWIKPEN) 500 unit/mL (3 mL) CONCENTRATED injection Inject 150 Units under the skin in the morning. (Patient taking differently: Inject 100 Units under the skin in the morning.) 30 mL 3    lamoTRIgine (LAMICTAL) 150 MG tablet Take 1 tablet (150 mg total) by mouth two (2) times a day.      lancets (ACCU-CHEK SOFTCLIX LANCETS) Misc Check sugar three times per day before meals for insulin dependent type two diabetes.  E11.65 100 each 11    leuprolide acetate (LUPRON DEPOT IM) Inject into the muscle. Every 3 months      metFORMIN (GLUCOPHAGE) 1000 MG tablet TAKE ONE TABLET BY MOUTH TWICE DAILY IN THE MORNING  AND IN THE EVENING. TAKE WITH MEALS 180 tablet 3    norethindrone (AYGESTIN) 5 mg tablet TAKE ONE TABLET EVERY MORNING 30 tablet 11    ondansetron (ZOFRAN-ODT) 4 MG disintegrating tablet Take 1 tablet (4 mg total) by mouth every eight (8) hours as needed for nausea. 30 tablet 1    pantoprazole (PROTONIX) 40 MG tablet TAKE 1 TABLET BY MOUTH IN THE MORNING. 90 tablet 3    pregabalin (LYRICA) 75 MG capsule TAKE 1 CAPSULE BY MOUTH IN THE MORNING AND 2 CAPSULES BY MOUTH IN THE EVENING. 270 capsule 0    TECHLITE PEN NEEDLE 32 gauge x 1/4 (6 mm) Ndle USE 3 TIMES DAILY AS DIRECTED 300 each 4    tirzepatide (MOUNJARO) 10 mg/0.5 mL PnIj Inject 10 mg under the skin every seven (7) days. 6 mL 3    torsemide (DEMADEX) 20 MG tablet TAKE FOUR TABLETS BY MOUTH EVERY DAY 120 tablet 6    traZODone (DESYREL) 100 MG tablet TAKE 2 TABLETS BY MOUTH AT BEDTIME 180 tablet 3    ziprasidone (GEODON) 80 MG capsule Take 1 capsule (80 mg total) by mouth in the morning and 1 capsule (80 mg total) in the evening. Take with meals.       No current facility-administered medications for this visit.     Facility-Administered Medications Ordered in Other Visits   Medication Dose Route Frequency Provider Last Rate Last Admin    leuprolide (LUPRON) injection 11.25 mg  11.25 mg Intramuscular Once Coombs, Leta Speller, MD        leuprolide (LUPRON) injection 11.25 mg  11.25 mg Intramuscular Once Coombs, Leta Speller, MD         Nutrition History:     Dietary Restrictions:  No known food allergies. She noted food intolerance to milk.     Gastrointestinal Issues: Nausea Constipation Heartburn Takes Miralax and Protonix.    Hunger and Satiety:  Patient does not endorse hunger with Mounjaro. Intake is very limited with Mounjaro.    Food Safety and Access: She has SNAP/EBT benefits.     Diet Recall:   Time Intake   Breakfast  2 Eggs + 1 chicken + onions (15-20g)   Snack (AM)    Lunch  Healthy Choice Meal (25g)   Snack (PM) Apple   Dinner  1.5 oz protein OR ground [place] + cheese shell OR 3 oz Chicken sandwich (20g) + keto bread OR Healthy Choice Meal (25g)   Snack (HS) 10 cheese crackers   Tried Healthy Choice - with protein and pasta.   Looking to try peanut butter + Oikos yogurt dip    Food-Related History:  Snacks: cheese crackers (made out of baked cheese), looking to get more Ratio yogurts, Fiber bars high in protein/low sugar + PB butter(1-2x/day)  Beverages: water + SF Kool Aid, [employer] electrolyte packets, MinuteMaid Zero sugar + Kool Aid (no sugar packet)  Cooking Methods: grilled, slow cooker   Meal Schedule: 2-3 nutrition opportunities.         Eating Behaviors:  Overeating: Denied issues with overeating.   Emotional Eating: Denied issues.   Grazing: Denied issues.   Fast Eating:  I'm trying to eat slow    Nighttime Eating: None.    Application of Bariatric Dietary Behaviors:  Not drinking with meals and waiting for 30 minutes after - progressingI'm not drinking with meals as much  Focusing on protein rich foods at meals/snacks - progressing  Mindful eating - progressing  Eliminating carbonated/sugar sweetened beverages -  accomplished  Choosing a protein shake and a bariatric vitamins - not accomplished   Protein shake: None selected; Interested in BariatricPal 30g Whey & Collagen Complete Protein Power Pro Shots                               Bariatric Multivitamin: None selected    Physical Activity:  Physical activity level is sedentary with little to no exercise.    Type: Walking, seated exercises              Duration: 6 min walk everyday    Daily Estimated Nutritional Needs:  Energy: 2336 kcals [Per [place] St-Jeor Equation (AF of 1.0) using  , 152.1 kg (01/19/24 1054)]  Protein: 84-100 gm [1.0-1.2 gm/kg using , 83.59 kg (BMI of 25) (01/19/24 1054)]  Carbohydrate:   [< / equal to 45% of kcal]  Fluid: *no fluid restriction per patientMarijo File MD team]    Nutrition Goals & Evaluation      Demonstrate understanding of all bariatric nutrition/dietary behaviors  (Met and Ongoing)  Demonstrate behavioral change  (Met, Not Met, and Ongoing)  Meet nutritional needs (Met, Not Met, and Ongoing)  Successful application of  nutrition goals (Not Met and Ongoing)  Reduce weight by 0.5-1.0 lbs per week by next next nutrition visit (Met and Ongoing)    Nutrition goals reviewed, relevant barriers identified and addressed: none evident. She is evaluated to have good to fair willingness and ability to achieve nutrition goals.       Nutrition Assessment       The patient is currently meeting some of estimated nutrition needs related to current intake. Per anthropometric data, BMI: 45.48 kg/m2 classifies patient as obese (class III). EMR indicates a 4lb weight loss since last RD visit. February labs noted elevated glucose level potentially due to decreased Humulin dosage. She has been successful with some of previous nutrition recommendations.  She is eating 3 times per day. Patient may benefit from incorporating a protein drink (or unflavored powder to drinks) and other low volume,  protein-rich items each day to meet daily protein goal. Patient is focused on protein at meals with carbohydrates coming from both starchy and non-starchy sources. Patient is encouraged to be mindful of sodium, fat, and simple carbohydrate sources to decrease adverse effects of conditions. Patient also is a selective eater would benefit from prioritizing protein and complex carb sources. Beverages are sugar free and non-carbonated. She is practicing not drinking with meals, however, this remains a challenge. She is not participating in a regular exercise regimen. She has not selected an appropriate bariatric multivitamin. Patient was given the option to progress to nutrition class while working on the necessary habits for surgery. Patient was advised that clearance for surgery (after nutrition class) is dependent on progression of these habits (I.e., finding protein shake, meeting protein goal with daily options, etc.)    Bariatric Surgery Assessment: RD is withholding assessment for insurance approval for weight loss surgery pending patient follow-up where success with plan goals and food record/usual intake/24-hour recall will be assessed.        Nutrition Intervention      - Nutrition Education: Bariatric dietary and lifestyle habits. Education resource(s) or methods provided:: After visit summary with patient instructions     Nutrition Plan:   Please see patient instructions.    Follow up will occur after completion of nutrition class.    Patient scheduled for class in  person on 02/05/2024. Patient will also need an additional follow-up visit after class to assess adherence to nutrition goals and understanding of the pre op diet, post op diet progression, protein/fluid goals, and exercise/vitamin recommendations before being approved for surgery.       Food/Nutrition-related history, Anthropometric measurements, Biochemical data, medical tests, procedures, Patient understanding or compliance with intervention and recommendations , and Effectiveness of nutrition interventions will be assessed at time of follow-up.     Recommendations for Clinical Team:  Continue to recommend following a consistent meal/snack schedule with a focus on protein. Emphasize a variety of exercise (aerobic, strength, stretching and balance) outside of daily routine should also be encouraged for 150-300 minutes per week.      Patient seen according to established protocol for bariatric care.       The patient reports they are physically located in [place] and is currently: at home. I conducted a audio/video visit. I spent  10m 40s on the video call with the patient. I spent an additional 33 minutes on pre- and post-visit activities on the date of service .     I am located on-site and the patient is located off-site for this visit.

## 2024-01-19 ENCOUNTER — Ambulatory Visit: Admit: 2024-01-19 | Discharge: 2024-01-20 | Payer: PRIVATE HEALTH INSURANCE

## 2024-01-19 DIAGNOSIS — C9210 Chronic myeloid leukemia, BCR/ABL-positive, not having achieved remission: Principal | ICD-10-CM

## 2024-01-19 MED ADMIN — leuprolide (LUPRON) injection 11.25 mg: 11.25 mg | INTRAMUSCULAR | @ 17:00:00 | Stop: 2024-01-19

## 2024-01-19 NOTE — Unmapped (Signed)
Pt received Lupron injection in right gluteal - pt tolerated well.    AVS received at scheduling.    Pt stable and ambulated independently from clinic.

## 2024-01-19 NOTE — Unmapped (Signed)
 Fertility Preservation Consultation    I connected with Jenna Mosley to discuss fertility preservation and/or ART options that might be available to her after her cancer treatment and bariatric surgery are complete. We discussed the basics of egg freezing, IVF, and IUI. The processes, timeline, and potential expenses involved with each were outlined briefly.     The patient endorsed that she was appreciative of the information and support. She stated that she would like to hold off on an REI consult for now since she is not a point medically where she is ready to move forward with egg freezing or pregnancy initiation. She will plan to revisit this option once she is healthier and able to pursue fertility preservation or pregnancy.       I encouraged the patient to contact me as needed going forward. I am happy to reconnect.      Thank you for the consultation request. Please contact me with questions.      [place] Lodato Hunt, MSW, LCSW  [facility] Fertility Preservation Clinical Coordinator   Pager: [phone]  Phone: [phone]

## 2024-01-19 NOTE — Unmapped (Addendum)
 GOALS:     1.  Meal schedule: remember to eat protein every 3-4 hours                          - include protein at every meal and every snack                          - include protein drink everyday to help meet protein goal                Mounjaro early days after injection  11:00am: breakfast - include protein (I.e., protein drink to sip on throughout day)              2:00pm: protein based snack (I.e., ratio yogurt)              6:00pm: dinner - protein + non-starchy vegetables (I.e, high proteinHealthy Choice meal)     Several days after injection when you have more capacity  11:00am: breakfast - include protein (I.e., protein drink, yogurt, eggs,  etc.)              2:00pm: lunch - protein + non-starchy vegetables (I.e., ground [place], beans, protein pasta, high protein Healthy Choice meal, etc.)              4:00pm: protein based snack (I.e., protein drink, yogurt (dip), cheese, etc.)              6:00pm: dinner - protein + non-starchy vegetables (I.e., high protein Healthy Choice meal,   chicken, etc.)     PROTEIN OPTIONS: chicken/beef/pork, fish/tuna, eggs, cheese, cottage cheese, [place] yogurt (Ratio, Fage, Oikos, etc.), deli meat, protein shakes, beans, nuts/seeds, peanut butter              * Nuts and peanut butter are restricted to one serving once per day.                          1/4 cup nuts OR 2 Tbsp peanut butter     15g of protein equals...  - 2 oz chicken/beef/pork  - 2 oz fish/tuna  - 2 eggs  - 2oz cheese  - 5 oz cottage cheese,   - 6 oz [place] yogurt (some options contain more),  - ~1/2 protein shakes (depending on size)  - 1cup beans,  - 4 Tbsp peanut butter or 1/2 cup nuts              * Nuts and peanut butter are restricted to one serving once per day.                          1/4 cup nuts OR 2 Tbsp peanut butter     Feel free to mix and match options to make 15g and aim for 4 options per day.     2.  Practice not drinking at meals and waiting for 30 minutes after meals to start drinking again.     3.  Research and try protein shakes.              - Order and try BariatricPal 30g Whey & Collagen Complete Protein Power Pro Shots    4. Looking forward to seeing you in class on Friday, March 21st! A few things to keep in mind:     Please plan to arrive by 1:45pm. I will  meet all of you at the main entrance to the hospital and take you back to the conference room.  If you are in the parking lot facing the hospital, you will go in the main doors on the RIGHT. The administrative hallway is currently locked and you need badge access to get in. If you are late, you physically will not be able to get to class since I will not be able to come back and get you.  There is a variety of seating in the lobby.  Just wait there and I will come and get the group.    BRING YOUR NOTEBOOK!!! This will be helpful with regards to taking notes and jogging your memory for questions you may have.    BRING A PEN OR A PENCIL!!!       Remember this is an in person class at the [facility].  Go in the main entrance to the hospital. There is a large reception desk and plenty of places to sit until I can come and walk you to the conference room. Do not go to the medical office building or the nutrition clinic where you had your initial visit.       5. RD clearance after nutrition class is dependent on progression of behaviors

## 2024-01-22 MED ORDER — MOUNJARO 10 MG/0.5 ML SUBCUTANEOUS PEN INJECTOR
SUBCUTANEOUS | 11 refills | 0.00 days | Status: CP
Start: 2024-01-22 — End: 2024-01-11

## 2024-01-31 DIAGNOSIS — C9210 Chronic myeloid leukemia, BCR/ABL-positive, not having achieved remission: Principal | ICD-10-CM

## 2024-02-01 MED ORDER — PANTOPRAZOLE 40 MG TABLET,DELAYED RELEASE
ORAL_TABLET | Freq: Every morning | ORAL | 3 refills | 90.00 days | Status: CP
Start: 2024-02-01 — End: ?

## 2024-02-01 MED ORDER — PREGABALIN 75 MG CAPSULE
ORAL_CAPSULE | 0 refills | 0.00 days
Start: 2024-02-01 — End: ?

## 2024-02-02 ENCOUNTER — Ambulatory Visit: Admit: 2024-02-02 | Discharge: 2024-02-03 | Payer: PRIVATE HEALTH INSURANCE

## 2024-02-02 DIAGNOSIS — E66813 Class 3 obesity: Principal | ICD-10-CM

## 2024-02-02 DIAGNOSIS — Z794 Long term (current) use of insulin: Principal | ICD-10-CM

## 2024-02-02 DIAGNOSIS — E1140 Type 2 diabetes mellitus with diabetic neuropathy, unspecified: Principal | ICD-10-CM

## 2024-02-02 DIAGNOSIS — R809 Proteinuria, unspecified: Principal | ICD-10-CM

## 2024-02-02 MED ORDER — PREGABALIN 75 MG CAPSULE
ORAL_CAPSULE | 0 refills | 0.00 days
Start: 2024-02-02 — End: ?

## 2024-02-02 NOTE — Unmapped (Signed)
 Dexcom Report uploaded to Careers information officer.

## 2024-02-02 NOTE — Unmapped (Addendum)
 Adjust U500- to 100 units every morning. Your goal fasting glucose < 180.   Continue Mounjaro 10 mg weekly, after 4 weeks can Increase to 12.5 mg weekly.   Continue Metformin 1000 mg twice a day.   Farxiga 5 mg every day.

## 2024-02-02 NOTE — Unmapped (Signed)
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 EVENING., Disp: 270 capsule, Rfl: 0    TECHLITE PEN NEEDLE 32 gauge x 1/4 (6 mm) Ndle, USE 3 TIMES DAILY AS DIRECTED, Disp: 300 each, Rfl: 4    tirzepatide (MOUNJARO) 10 mg/0.5 mL PnIj, Inject 10 mg under the skin every seven (7) days., Disp: 6 mL, Rfl: 3    torsemide (DEMADEX) 20 MG tablet, TAKE FOUR TABLETS BY MOUTH EVERY DAY, Disp: 120 tablet, Rfl: 6    traZODone (DESYREL) 100 MG tablet, TAKE 2 TABLETS BY MOUTH AT BEDTIME, Disp: 180 tablet, Rfl: 3    ziprasidone (GEODON) 80 MG capsule, Take 1 capsule (80 mg total) by mouth in the morning and 1 capsule (80 mg total) in the evening. Take with meals., Disp: , Rfl:     [START ON 05/24/2024] tirzepatide (MOUNJARO) 12.5 mg/0.5 mL PnIj, Inject 12.5 mg under the skin every seven (7) days for 4 doses., Disp: 2 mL, Rfl: 0  No current facility-administered medications for this visit.    Facility-Administered Medications Ordered in Other Visits:     leuprolide (LUPRON) injection 11.25 mg, 11.25 mg, Intramuscular, Once, Coombs, Leta Speller, MD       Objective:     Physical exam:  Vitals:    02/02/24 1554   BP: 103/73   Pulse: 82   Temp: 36.2 ??C (97.2 ??F)       Wt Readings from Last 3 Encounters:   02/02/24 (!) 152.8 kg (336 lb 12.8 oz)   01/19/24 (!) 152.1 kg (335 lb 5.1 oz)   01/11/24 (!) 152.1 kg (335 lb 5.1 oz)     BMI Readings from Last 3 Encounters:   02/02/24 45.67 kg/m??   01/19/24 45.48 kg/m??   01/11/24 45.48 kg/m??      GEN: appears well, in NAD  HEENT: sclerae anicteric  NECK:  no visible neck mass or deformity  CHEST: normal breathing chest movements  NEURO: Aox3, following commands  PSYCH: normal affect.  SKIN: no visible rash         Labs reviewed:  Lab Results   Component Value Date    A1C 6.6 02/02/2024       Lab Results   Component Value Date    NA 140 01/11/2024    K 3.9 01/11/2024    CL 96 (L) 01/11/2024    CO2 32.0 (H) 01/11/2024    BUN 16 01/11/2024    CREATININE 0.85 01/11/2024    GFR >= 60 02/11/2013    GLU 230 (H) 01/11/2024    CALCIUM 10.4 01/11/2024    ALBUMIN 3.8 01/11/2024    PHOS 3.7 12/17/2023       Lab Results   Component Value Date    TSH 1.027 11/26/2023

## 2024-02-04 DIAGNOSIS — C9210 Chronic myeloid leukemia, BCR/ABL-positive, not having achieved remission: Principal | ICD-10-CM

## 2024-02-04 NOTE — Unmapped (Signed)
 The [facility] has made a second and final attempt to reach this patient to refill the following medication:Bosulif.      We have left voicemails on the following phone numbers: [phone], have sent a MyChart message, have sent a text message to the following phone numbers: [phone], and have sent a Mychart questionnaire..    Dates contacted: 3/12,20  Last scheduled delivery: 01/08/24    The patient may be at risk of non-compliance with this medication. The patient should call the [facility] at [phone]  Option 4, then Option 1: Oncology to refill medication.    [facility] Specialty Technician

## 2024-02-04 NOTE — Unmapped (Signed)
 [facility] Delivery Pharm[facility] Refill Coordination Note    Specialty Medication(s) to be Shipped:   Hematology/Oncology: Bosulif    Other medication(s) to be shipped: No additional medications requested for fill at this time     Jenna Mosley, DOB: [birth year]  Phone: [phone] (home)       All above HIPAA information was verified with patient.     Was a Nurse, learning disability used for this call? No    Completed refill call assessment today to schedule patient's medication shipment from the [facility]  [phone]).  All relevant notes have been reviewed.     Specialty medication(s) and dose(s) confirmed: Regimen is correct and unchanged.   Changes to medications: [name] reports no changes at this time.  Changes to insurance: No  New side effects reported not previously addressed with a pharmacist or physician: None reported  Questions for the pharmacist: No    Confirmed patient received a Conservation officer, historic buildings and a Surveyor, mining with first shipment. The patient will receive a drug information handout for each medication shipped and additional FDA Medication Guides as required.       DISEASE/MEDICATION-SPECIFIC INFORMATION        N/A    SPECIALTY MEDICATION ADHERENCE     Medication Adherence    Patient reported X missed doses in the last month: 0  Specialty Medication: Bosulif 500 mg  Patient is on additional specialty medications: No  Informant: patient              Were doses missed due to medication being on hold? No    BOSULIF 500 mg Tab (bosutinib): 5 days of medicine on hand       REFERRAL TO PHARMACIST     Referral to the pharmacist: Not needed      [facility]     Shipping address confirmed in Epic.     Cost and Payment: Patient has a copay of $4. They are aware and have authorized the pharmacy to charge the credit card on file.    Delivery Scheduled: Yes, Expected medication delivery date: 02/05/23.     Medication will be delivered via Same Day Courier to the prescription address in Epic WAM.    Hanya Guerin   [facility]  Specialty Technician

## 2024-02-05 ENCOUNTER — Ambulatory Visit
Admit: 2024-02-05 | Discharge: 2024-02-06 | Payer: PRIVATE HEALTH INSURANCE | Attending: Registered" | Primary: Registered"

## 2024-02-05 DIAGNOSIS — C9210 Chronic myeloid leukemia, BCR/ABL-positive, not having achieved remission: Principal | ICD-10-CM

## 2024-02-05 MED FILL — BOSULIF 500 MG TABLET: 30 days supply | Qty: 30 | Fill #2
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
## 2024-02-11 DIAGNOSIS — C9210 Chronic myeloid leukemia, BCR/ABL-positive, not having achieved remission: Principal | ICD-10-CM

## 2024-02-15 ENCOUNTER — Encounter: Admit: 2024-02-15 | Discharge: 2024-02-16 | Attending: Clinical | Primary: Clinical

## 2024-02-15 DIAGNOSIS — F54 Psychological and behavioral factors associated with disorders or diseases classified elsewhere: Principal | ICD-10-CM

## 2024-02-15 MED ORDER — TORSEMIDE 20 MG TABLET
ORAL_TABLET | Freq: Every day | ORAL | 6 refills | 30 days | Status: CP
Start: 2024-02-15 — End: ?
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
## 2024-02-15 NOTE — Unmapped (Signed)
 Adult Refill: Patient is requesting the following refill  Requested Prescriptions     Pending Prescriptions Disp Refills    torsemide (DEMADEX) 20 MG tablet [Pharmacy Med Name: TORSEMIDE 20 MG TAB] 120 tablet 6     Sig: TAKE 4 TABLETS BY MOUTH ONCE DAILY       Recent Visits  Date Type Provider Dept   12/31/23 Office Visit Harlow Mares, MD [facility] Internal Medicine [facility] [place]   11/24/23 Office Visit Erdem, Mauricio Po, MD [facility] Internal Medicine [facility] [place]   11/06/23 Office Visit Durenda Hurt, MD [facility] Internal Medicine [facility] [place]   05/06/23 Office Visit Erdem, Mauricio Po, MD [place] [facility]   Showing recent visits within past 365 days and meeting all other requirements  Future Appointments  No visits were found meeting these conditions.  Showing future appointments within next 365 days and meeting all other requirements       Labs: Creatinine:   Creatinine Whole Blood, POC (mg/dL)   Date Value   91/47/8295 0.8     Creatinine (mg/dL)   Date Value   62/13/0865 0.85   01/05/2015 0.55 (L)    Potassium:   Potassium (mmol/L)   Date Value   01/11/2024 3.9   01/05/2015 4.4    Sodium:   Sodium (mmol/L)   Date Value   01/11/2024 140   01/05/2015 136    Vitals:   BP Readings from Last 3 Encounters:   02/02/24 103/73   01/11/24 117/72   12/31/23 106/66    and   Pulse Readings from Last 3 Encounters:   02/02/24 82   01/11/24 69   12/31/23 74

## 2024-02-16 ENCOUNTER — Encounter: Admit: 2024-02-16 | Discharge: 2024-02-17

## 2024-02-16 NOTE — Unmapped (Signed)
 [facility] Outpatient Nutrition Services   Medical Nutrition Therapy Consultation       Visit Type:    Return Assessment    Referral Reason:   Bariatric weight loss surgery     Jenna Mosley is a 33 y.o. female seen for medical nutrition therapy. Her active problem list, medication list, allergies, notes from last encounter, and lab results were reviewed.     Her interim medical history is significant for Reflux, Hypertension, Polycystic ovarian syndrome, vitamin B12 deficiency, Obesity, Type II Diabetes, Non-alcoholic steatohepatitis, intestinal malabsorption, Gastroparesis, Cow's milk intolerance, Shortness of breath, Hyperkalemia, Heart Failure, Chronic renal impairment.    Anthropometrics   Height: 182.9 cm (6' 0.01)   Weight: (!) 152.8 kg (336 lb 12.8 oz)  Body mass index is 45.67 kg/m??.    Wt Readings from Last 5 Encounters:   02/17/24 (!) 152.8 kg (336 lb 12.8 oz)   02/02/24 (!) 152.8 kg (336 lb 12.8 oz)   01/19/24 (!) 152.1 kg (335 lb 5.1 oz)   01/11/24 (!) 152.1 kg (335 lb 5.1 oz)   12/31/23 (!) 152.4 kg (336 lb)      Goal weight per patient: no lower than 200#   Ideal Body Weight: 72.59 kg  Weight in (lb) to have BMI = 25: 184    Nutrition Risk Screening:   Food Insecurity: No Food Insecurity (12/31/2023)    Hunger Vital Sign     Worried About Running Out of Food in the Last Year: Never true     Ran Out of Food in the Last Year: Never true      Nutrition Focused Physical Exam:   Nutrition Evaluation  Overall Impressions: Unable to perform Nutrition-Focused Physical Exam at this time due to (comment) (virtual visit) (02/17/24 1033)  Nutrition Designation: Obese class III  (BMI > 39.99 kg/m2) (02/17/24 1033)     Malnutrition Screening:   Patient does not meet AND/ASPEN criteria for malnutrition at this time (02/17/24 1034)    Biochemical Data, Medical Tests and Procedures:  No recent pertinent labs or other nutritionally relevant data available for review.    Lab Results   Component Value Date HGB 13.4 01/11/2024    HCT 39.6 01/11/2024    MCV 85.2 01/11/2024    IRON 56 11/26/2023    TIBC 356 11/26/2023    FERRITIN 104.3 11/26/2023    VITAMINB12 285 11/26/2023    FOLATE 7.5 11/26/2023    VITDTOTAL 44.1 11/26/2023     Lab Results   Component Value Date    ALKPHOS 232 (H) 01/11/2024    ALT 25 01/11/2024    AST 37 (H) 01/11/2024    BUN 16 01/11/2024    CREATININE 0.85 01/11/2024    GFRNONAA >=60 03/02/2018    GFRAA >=60 03/02/2018     Lab Results   Component Value Date    CHOL 215 (H) 07/31/2023    HDL 28 (L) 07/31/2023    LDL  07/31/2023      Comment:      Unable to calculate due to triglyceride greater than 400 mg/dL.    LDL 89.0 07/31/2023    NONHDL 187 (H) 07/31/2023    TRIG 721 (H) 07/31/2023    A1C 6.6 02/02/2024    TSH 1.027 11/26/2023     Medications and Vitamin/Mineral Supplementation:   All nutritionally pertinent medications reviewed on 02/16/2024  Nutritionally pertinent medications include: Coreg, Farxiga, Hydroxyzine, Insulin, Metformin, Mounjaro - 10 units, Zofran, Protonix, Pregablin, Torsemide, Trazodone  She is  taking nutrition supplements. Calcium-carb, vitamin D3, ferrous sulfate     Current Outpatient Medications   Medication Sig Dispense Refill    acetaminophen (TYLENOL) 500 MG tablet Take 2 tablets (1,000 mg total) by mouth every six (6) hours as needed for pain. Extra strength tylenol      albuterol HFA 90 mcg/actuation inhaler Inhale 2 puffs every six (6) hours as needed. 8 g 0    alcohol swabs (ALCOHOL PADS) PadM Apply 1 Swab topically Three (3) times a day before meals. 100 each 3    blood sugar diagnostic (ACCU-CHEK GUIDE TEST STRIPS) Strp Check sugars before meals three times for insulin dependent type two diabetes. E11.65 100 each 11    blood-glucose sensor (DEXCOM G7 SENSOR) Devi Change sensor every 10 days. 9 each 3    bosutinib 500 mg Tab Take 1 tablet (500 mg total) by mouth once daily. Administer with food. Swallow tablet whole; do not cut, crush, break, or chew. 30 tablet 2 calcium carbonate-vitamin D3 600 mg-20 mcg (800 unit) Tab Take 1 mg by mouth Two (2) times a day (at 8am and 12:00).      carvedilol (COREG) 6.25 MG tablet TAKE ONE TABLET BY MOUTH EVERY MORNING AND ONE EVERY EVENING WITH MEALS 60 tablet 6    cetirizine (ZYRTEC) 10 MG tablet TAKE 1 TABLET BY MOUTH IN THE MORNING. 90 tablet 3    cholecalciferol, vitamin D3, (VITAMIN D3 ORAL) Take by mouth.      clonazePAM (KLONOPIN) 0.5 MG tablet Take 1 tablet (0.5 mg total) by mouth daily as needed. PRN      cyclobenzaprine (FLEXERIL) 5 MG tablet Take 1 tablet (5 mg total) by mouth Three (3) times a day as needed (lower back pain). 90 tablet 1    dapagliflozin propanediol (FARXIGA) 10 mg Tab tablet Take 1 tablet (10 mg total) by mouth every morning. 90 tablet 3    divalproex ER (DEPAKOTE ER) 500 MG extended released 24 hr tablet TAKE 1 TABLET BY MOUTH AT BEDTIME 90 tablet 3    ferrous sulfate 325 (65 FE) MG tablet Take 1 tablet (325 mg total) by mouth in the morning.      fluticasone propionate (FLONASE) 50 mcg/actuation nasal spray 2 sprays into each nostril daily. (Patient taking differently: 2 sprays into each nostril as needed.) 16 g 0    hydrOXYzine (VISTARIL) 50 MG capsule Take 2 capsules (100 mg total) by mouth nightly. And takes PRN (Patient taking differently: Take 4 capsules (200 mg total) by mouth nightly. And takes PRN)      insulin regular hum U-500 conc (HUMULIN R U-500, CONC, KWIKPEN) 500 unit/mL (3 mL) CONCENTRATED injection Inject 150 Units under the skin in the morning. (Patient taking differently: Inject 100 Units under the skin in the morning.) 30 mL 3    lamoTRIgine (LAMICTAL) 150 MG tablet Take 1 tablet (150 mg total) by mouth two (2) times a day.      lancets (ACCU-CHEK SOFTCLIX LANCETS) Misc Check sugar three times per day before meals for insulin dependent type two diabetes.  E11.65 100 each 11    leuprolide acetate (LUPRON DEPOT IM) Inject into the muscle. Every 3 months      metFORMIN (GLUCOPHAGE) 1000 MG tablet TAKE ONE TABLET BY MOUTH TWICE DAILY IN THE MORNING AND IN THE EVENING. TAKE WITH MEALS 180 tablet 3    norethindrone (AYGESTIN) 5 mg tablet TAKE ONE TABLET EVERY MORNING 30 tablet 11    ondansetron (ZOFRAN-ODT) 4 MG disintegrating  tablet Take 1 tablet (4 mg total) by mouth every eight (8) hours as needed for nausea. 30 tablet 1    pantoprazole (PROTONIX) 40 MG tablet TAKE 1 TABLET BY MOUTH IN THE MORNING. 90 tablet 3    pregabalin (LYRICA) 75 MG capsule TAKE 1 CAPSULE BY MOUTH IN THE MORNING AND 2 CAPSULES BY MOUTH IN THE EVENING. 270 capsule 0    TECHLITE PEN NEEDLE 32 gauge x 1/4 (6 mm) Ndle USE 3 TIMES DAILY AS DIRECTED 300 each 4    tirzepatide (MOUNJARO) 10 mg/0.5 mL PnIj Inject 10 mg under the skin every seven (7) days. 6 mL 3    [START ON 05/24/2024] tirzepatide (MOUNJARO) 12.5 mg/0.5 mL PnIj Inject 12.5 mg under the skin every seven (7) days for 4 doses. 2 mL 0    torsemide (DEMADEX) 20 MG tablet TAKE 4 TABLETS BY MOUTH ONCE DAILY 120 tablet 6    traZODone (DESYREL) 100 MG tablet TAKE 2 TABLETS BY MOUTH AT BEDTIME 180 tablet 3    ziprasidone (GEODON) 80 MG capsule Take 1 capsule (80 mg total) by mouth in the morning and 1 capsule (80 mg total) in the evening. Take with meals.       No current facility-administered medications for this visit.     Facility-Administered Medications Ordered in Other Visits   Medication Dose Route Frequency Provider Last Rate Last Admin    leuprolide (LUPRON) injection 11.25 mg  11.25 mg Intramuscular Once Coombs, Leta Speller, MD         Nutrition History:     Dietary Restrictions: No known food allergies. She noted food intolerance to milk.    Gastrointestinal Issues: Nausea Constipation Heartburn Takes Miralax and Protonix.     Hunger and Satiety:  Patient has been eating more since getting used to [facility] dosage. Patient endorses     Food Safety and Access: She has SNAP/EBT benefits.     Diet Recall:   Time Intake   Breakfast Kell[employer] Special K protein bar (12g) OR Ratio yogurt (15-20g) + fruit   Snack (AM)    Lunch Healthy Choice Meal (Basil Pesto Sauce) - 20g   Snack (PM) Apple + yogurt/PB dip (10g)   Dinner [employer] (25g) OR ground [place] meat + cheese shells     Snack (HS) Sandwich (meat/spinach/honey mustard) to help regulate BGs OR grapes     Food-Related History:  Snacks: homemade yogurt + PB dip (30g), fruit, pork rinds  Beverages: water + SF Kool Aid, [employer] electrolyte packets, MinuteMaid Zero sugar + Kool Aid (no sugar packet). Has on occasion a diet soda.  Dining Out: [employer] Methods: grilled, slow cooker   Meal Schedule: 2 meals, 2-3 snacks       Eating Behaviors:  Overeating: Denied issues with overeating.   Emotional Eating: Denied issues.   Grazing: Denied issues.   Fast Eating:  I'm trying to eat slow   Nighttime Eating:  To help regulate blood sugar sometimes.     Application of Bariatric Dietary Behaviors:  Not drinking with meals and waiting for 30 minutes after - progressing I've been doing better. I've been setting a timer.  Focusing on protein rich foods at meals/snacks - accomplished  Mindful eating - progressing  Eliminating carbonated/sugar sweetened beverages - accomplished  Choosing a protein shake and a bariatric vitamins - progressing   Protein shake: None selected; Interested in BariatricPal 30g Whey & Collagen Complete Protein Power Pro Shots  Bariatric Multivitamin: Bariatric Fusion     Physical Activity:  Physical activity level is moderate with 3-5 days of exercise.    Type: Gym - Walking @ 10 minutes at incline + Weights (arms), chair exercises     Daily Estimated Nutritional Needs:  Energy: 2343 kcals [Per [place]-Jeor Equation (AF of 1.0) using  , 152.77 kg (02/17/24 1034)]  Protein: 83-100 gm [1.0-1.2 gm/kg using  , 83.64 kg (BMI of 25) (02/17/24 1034)]  Carbohydrate:   [< / equal to 45% of kcal]  Fluid: *no fluid restriction per patientMarijo File MD team]    Nutrition Goals & Evaluation      Demonstrate understanding of all bariatric nutrition/dietary behaviors  (Met and Ongoing)  Demonstrate behavioral change  (Ongoing and Progressing)  Meet nutritional needs (Met and Ongoing)  Successful application of  nutrition goals (Ongoing and Progressing)  Reduce weight by 0.5-1.0 lbs per week by next next nutrition visit (Not Met and Ongoing)    Nutrition goals reviewed, relevant barriers identified and addressed: none evident. She is evaluated to have good to fair willingness and ability to achieve nutrition goals.     Nutrition Assessment       The patient is currently meeting estimated nutrition needs related to current intake. Per anthropometric data, BMI: 45.67 kg/m2 classifies patient as obese (class III). EMR indicates a 1 lb weight gain since last RD visit. No recent labs to assess. She has been successful with previous nutrition recommendations.  She is eating 2 meals and 2-3 snacks per day. Patient is focused on protein at meals with carbohydrates coming from both starchy and non-starchy sources. Patient is encouraged to be mindful of sodium, fat, and simple carbohydrate sources to decrease adverse effects of conditions. Patient also is a selective eater would benefit from prioritizing protein and complex carb sources. Beverages are sugar free and non-carbonated. She is practicing not drinking with meals, however, this remains a challenge. Patient's physical activity has increased since last RD visit. She has selected an appropriate bariatric multivitamin, but has not tried a protein shake that works for her. Patient was encouraged to contact RD if protein shots do not work for patient.    Bariatric Surgery Assessment: Patient has had many previous unsuccessful attempts at weight loss. Patient has expressed a verbal understanding of the post bariatric surgery diet and the recommendations including: meal/snack timing, protein intake, not eating and drinking at meals and waiting for 30 minutes after meals to start drinking, vitamin supplementation, and regular exercise. Patient is aware that failure to follow the post operative plan may have negative consequences such as failure to lose weight/weight gain and vitamin deficiencies. Patient was amenable to suggestions and has expressed a willingness/ability to follow recommendations in order to have a positive outcome from bariatric surgery. At this time, RD believes patient is a good candidate for surgery.      Nutrition Intervention      - Nutrition Education: Bariatric dietary and lifestyle habits. Education resource(s) or methods provided:: After visit summary with patient instructions     Nutrition Plan:   Please see patient instructions.    Follow up will occur at 6-week post operative visit.     Food/Nutrition-related history, Anthropometric measurements, Biochemical data, medical tests, procedures, Patient understanding or compliance with intervention and recommendations , and Effectiveness of nutrition interventions will be assessed at time of follow-up.     Recommendations for Clinical Team:  Continue to recommend following a consistent meal/snack schedule with a focus  on protein. Emphasize a variety of exercise (aerobic, strength, stretching and balance) outside of daily routine should also be encouraged for 150-300 minutes per week.      Patient seen according to established protocol for bariatric care.       The patient reports they are physically located in [place] and is currently: at home. I conducted a audio/video visit. I spent  38m 37s on the video call with the patient. I spent an additional 32 minutes on pre- and post-visit activities on the date of service .     I am located on-site and the patient is located off-site for this visit.

## 2024-02-17 DIAGNOSIS — C9210 Chronic myeloid leukemia, BCR/ABL-positive, not having achieved remission: Principal | ICD-10-CM

## 2024-02-17 NOTE — Unmapped (Addendum)
 GOALS:     1.  Continue to follow consistent meal schedule: remember to eat protein every 3-4 hours                          - include protein at every meal and most snacks     - be mindful of sodium, fat, and simple carbohydrate sources to decrease negative effects of   conditions     PROTEIN OPTIONS: chicken/beef/pork, fish/tuna, eggs, cheese, cottage cheese, [place] yogurt, deli meat, protein shakes, beans, nuts/seeds, peanut butter              * Nuts and peanut butter are restricted to one serving once per day.                          1/4 cup nuts OR 2 Tbsp peanut butter     2.  EXERCISE: Do activity outside of daily routine 3x/wk for 30-45 minutes  - do activities you enjoy to establish consistency  - increase duration/frequency/intensity as tolerated  - aim for variety in routine with a mix of aerobic, strength, stretching and balance  - GOAL: 150-300 minutes per week        3. Try protein shots and contact RD on any changes to choice    4. Continue practicing not drinking with meals and waiting 30 minutes after.     5. Continue to review bariatric surgery nutrition information weekly prior to surgery.     6. Please message Peter Garter, RD with any nutrition questions you may have before and after surgery.

## 2024-02-24 DIAGNOSIS — C9210 Chronic myeloid leukemia, BCR/ABL-positive, not having achieved remission: Principal | ICD-10-CM

## 2024-02-24 MED ORDER — BOSUTINIB 500 MG TABLET
ORAL_TABLET | 2 refills | 0.00 days | Status: CP
Start: 2024-02-24 — End: ?

## 2024-02-26 ENCOUNTER — Ambulatory Visit: Admit: 2024-02-26 | Discharge: 2024-02-27 | Payer: Medicaid (Managed Care) | Attending: Family | Primary: Family

## 2024-02-26 MED ORDER — CYANOCOBALAMIN (VIT B-12) 1,000 MCG/ML INJECTION KIT
PACK | INTRAMUSCULAR | 4 refills | 0.00 days | Status: CP
Start: 2024-02-26 — End: ?
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
## 2024-02-26 NOTE — Unmapped (Addendum)
 To do:    Toddler utensils  Keep taking your iron  in addition to your Bariatric Pal vitamin  See psychiatry to discuss your medication regimen  Labs floor 1    PATIENT INSTRUCTIONS  Please check out at the scheduler's desk.      You have now completed the Bariatric Surgery Program work up.    You will now be scheduled to meet with the surgeon    Our bariatric nurse coordinator, Honora Lutes, will call you for an appointment or notify you once approved. She can be reached at [phone]. [email or web address] Call her with requests for updates on the insurance. As I will not be receiving this information.     The visit with the surgeon will be at  [facility] at [address], [facility].    On your pre-op appointment with the surgeon, the type of surgery you requested will be confirmed, BUT he/she will make the final decision about the surgery type, a consent will be signed, and a surgery date will be given.      All metal and piercings must be removed from your body before going into the operating room. Nails may be worn (acrylic- lighter colors preferred).    Oral contraceptive agents the pill must be stopped 2 weeks prior to surgery and 4 weeks postoperatively.  Backup birth control is required, abstinence is encouraged.     You will receive a pre surgical clear drink on your preoperative appointment and will ingest 1 hour prior to arrive time to the hospital    Since you are currently taking antiobesity medications : Mounjaro  : This must be stopped 10-14 days prior to surgery.    Please make an appointment to see the dietitian 6 weeks post surgery and me 3 months post surgery  [phone], press 1 for appointments    PREOP  See Binder for all details  Start Bariatric surgery LIVER SHRINK diet 14 days prior to surgery   Take your bariatric vitamins   Take docusate sodium (Colace) 100 mg twice a day to prevent constipation    POSTOP  See Binder for all details  Day 1 to Day 10: follow a full Liquid diet/Protein shakes, water, broth- for 10 full days   Miralax 17g 1 scoop up to twice a day to help alleviate constipation. Put the powder Miralax in Crystal Light or your protein shake  Purchase chewable Gas X to have on hand for Gas Pain   Take your Bariatric Chewable vitamins and Calcium  after you drink 3 oz of protein shake  Day 11 BEGIN Pureed Foods   You can continue or stop the Miralax as needed  Day 21 BEGIN Soft Solid Foods  Around 4-6 weeks after surgery you can start solid foods  Crush your medications until you are back on solid foods     Plastic surgery is not an option until you are >18 months post surgery      Be sure to join our group on Facebook- [facility] Support Group    Support Groups:     Www.zoom.com   ID: [id number]  Password: UNCWLS  1st  Monday of each month at 6 pm   3rd Thursday of each month at 1 pm    check website for more information   [email or web address]      Important websites:  www.[email or web address].com, or http://www.harvey.com/, www.[email or web address].[email or web address], www.[email or web address].com, www.[email or web address].com    Bariatric podcasts:  Thurman Flores PHD podcast: [facility] [facility]  Bariatric nutrition  coach podcast:  Birt Bulla  Active bariatric nutrition podcast:  Emelia Hang  Bariatric surgery success podcast: Dr. Natalie Bailey

## 2024-02-27 DIAGNOSIS — C9210 Chronic myeloid leukemia, BCR/ABL-positive, not having achieved remission: Principal | ICD-10-CM

## 2024-02-29 MED ORDER — CETIRIZINE 10 MG TABLET
ORAL_TABLET | Freq: Every morning | ORAL | 3 refills | 0.00 days
Start: 2024-02-29 — End: ?

## 2024-02-29 NOTE — Unmapped (Signed)
 Adult Refill: Patient is requesting the following refill  Requested Prescriptions     Pending Prescriptions Disp Refills    cetirizine  (ZYRTEC ) 10 MG tablet [Pharmacy Med Name: CETIRIZINE  HCL 10 MG TAB] 90 tablet 3     Sig: TAKE 1 TABLET BY MOUTH IN THE MORNING.       Recent Visits  Date Type Provider Dept   12/31/23 Office Visit Dolph Friar, MD [facility]   11/24/23 Office Visit Erdem, Stevphen Elders, MD [facility] Internal Medicine [facility] [place]   11/06/23 Office Visit Andreas Bandy, MD [facility] Medicine [facility] [place]   05/06/23 Office Visit Erdem, Stevphen Elders, MD [facility] Medicine [facility]   Showing recent visits within past 365 days and meeting all other requirements  Future Appointments  No visits were found meeting these conditions.  Showing future appointments within next 365 days and meeting all other requirements       Labs: Vitals:   BP Readings from Last 3 Encounters:   02/26/24 119/71   02/02/24 103/73   01/11/24 117/72    and   Pulse Readings from Last 3 Encounters:   02/26/24 66   02/02/24 82   01/11/24 69

## 2024-02-29 NOTE — Unmapped (Signed)
 [facility] at [facility]  [address]  [place], [place] [address]    Clinical Pharmacist Visit Summary    Assessment and Plan:   1. Diabetes, type 2: good control.  Last A1c = 6.6% on 02/02/2024 with goal <7% without hypoglycemia.  CGM data show good, but upward trending BG with ABG 153 mg/dl, GMI 6.0%, TIR 45% and 0% low/very low. Review of daily CGM graphs indicate that patient is occasionally having low BG in the late afternoons/early evenings, likely attributed to her insulin  regimen.   Takes Mounjaro  10 mg weekly, Farxiga  5 mg daily, metformin  1000 mg BID, and U-500 insulin  100 units qAM with good adherence and without adverse effects or affordability concerns. She continues to work on dietary changes, physical activity and was recently approved for bariatric surgery (next office visit on 5/9 to schedule surgery date).   With shared decision making, will continue Farxiga /metformin , titrate Mounjaro  and taper insulin  by 15% to prevent afternoon/evening lows with incretin titration. Will reach out to patient's bariatric surgery and cardiology teams to discuss medication adjustments for the peri-procedural period. Congratulated patient on her weight loss progress/upcoming bariatric surgery and encouraged her to continue with lifestyle modifications as able. She verbalized understanding and agreement to the following plan:  INCREASE Mounjaro  to 12.5 mg subQ weekly.  DECREASE Humulin  R U-100 Kwikpen to 85 units subQ every morning for FBG <180 mg/dl.  Continue Farxiga  5 mg PO daily.  Continue metformin  1000 mg PO BID.  Continue Dexcom G7 CGM. Reviewed signs/symptoms/treatment of hypoglycemia.  Follow up with clinical pharmacist in mid-May as needed for medication management (message sent to schedulers).   Follow up with Dr. Beauford Bounds MD 06/07/2024.  Will message Dr. Benjiman Bras (Cardiology) and NP Zychowicz (Bariatric Surgery) to discuss medication plan for peri-procedural period.     Arnell Bevels, PharmD, CPP, BCACP, CDCES    I spent a total of  50  minutes face to face with the patient delivering clinical care and providing education/counseling.     Subjective:   Reason for visit: Establish care with Clinical Pharmacist Practitioner for blood glucose review and adjustment of diabetes regimen as needed.    Jenna Mosley is a 33 y.o. year old female with a history of diabetes type 2 who presents today for a diabetes-related visit. PMH includes HTN, HLD, HFpEF, chronic renal impairment with albuminuriea, gastroparesis, PCOS, anxiety, bipolar disorder, acid reflux, NASH/FLD, [name], pulmonary HTN.    Diabetes Provider and Last Visit Date:  Dr. Beauford Bounds MD on 02/02/2024    At Last Visit:   Adjust U500 to take 100 units every morning. Your goal fasting glucose <180.   Continue Mounjaro  10 mg weekly, after 4 weeks can Increase to 12.5 mg weekly.   Continue Metformin  1000 mg twice a day.   Continue Farxiga  5 mg every day.      Interval History of Present Illness:   02/05/2024: Took Bariatric Nutrition Class for pre-surgical candidates  02/15/2024: Had bariatric psychiatric evaluation for pre-surgical candidates  02/16/2024: Had bariatric nutrition consultation for pre-surgical candidates  02/26/2024: Met with bariatric surgery NP for evaluation    Today, patient presents for diabetes follow up accompanied by her mother, who also had bariatric surgery (though has gained 10 lbs due to chronic right knee pain, currently in PT trying to above knee replacement). Patient has been following closely with bariatric surgery team and has been approved for bariatric surgery; next appointment is on 5/9 to determine surgery date (which she  says is usually 2 weeks following the appointment). She reports adherence to Mounjaro  10 mg weekly, Farxiga  5 mg daily, metformin  1000 mg BID,  and U-500 insulin  100 units qAM. Denies missed doses. Denies adverse effects. Denies affordability concerns (has [place] Medicaid Managed Care insurance). Checks BG using Dexcom G7 CGM which she appreciates. Notes that BG tends to drop, sometimes overnight especially in the first couple days after taking Mounjaro ; has difficulty raising BG after a low despite excess carbohydrate intake. Inquires about medication adjustment during the peri-operative period. Diet and exercise as documented below. Discussed the importance of medication adherence, diet, and exercise to improve glycemic control. She  had no additional questions or concerns.     Diet (Typical):  Wakes up 10:30 - 11 AM  Breakfast: Usually skips  Lunch (12 PM): Eggs, chicken, Healthy Choice meals, berries/yogurt, meal replacement bars (Special K bars)  Dinner (5 - 6 PM): Chicken (boneless/skinless thighs), ground [place] tacos, sweet potato roasted cauliflower, broccoli, cabbage stir fry; does not eat rice or noodles or red meat) rarely has keto bread  Snacks: Fruit (strawberries, blueberries, apples), Skinny Pop individual pop.   Beverages: Diet soda, diet juice, diet Kool-Aid, does not drink much water; may have coffee.    Exercise: Reports regular physical activity 10-15 minutes of inclined walking, 15 minutes seated elliptical, and bikes 1 mile all in one day.; May do arm weights. Has trouble walkinghas swelling in right leg, have degenerative disc disease, has brusitis, back pain.     Social History:   Tobacco use: no  Illicit drug use use: no  Alcohol  use: no  Occupation: On disability  Household: Lives with mom, dogs,     POC glucose level today of 168 mg/dl is PC, biscuit and went to the gym.    Glucose Monitoring: Dexcom CGM        Hypoglycemia:    Symptoms of hypoglycemia since last visit: yes   Prior recognition of hypoglycemia symptoms and knowledge of treatment: yes   Treats with:  jelly beans,       Current Medications: Reports adherence to the following diabetes medications:   Humulin  R U-500, inject 100 units every morning with FBG goal <180.   Mounjaro  12.5 mg weekly.   Metformin  1000 mg twice a day. Farxiga  5 mg every day.      Previous Medications: Victoza      CARDIOVASCULAR RISK REDUCTION  History of clinical ASCVD? no  History of heart failure? yes  History of hyperlipidemia? yes  Taking statin? no  Taking aspirin? no  Taking SGLT-2i and/or GLP- 1 RA? yes, Farxiga  5 mg daily and Mounjaro  10 mg weekly    BLOOD PRESSURE CONTROL  History of hypertension?: yes  Taking ACEi/ARB? no, severe allergy  to lisinopril    KIDNEY CARE  History of Chronic Kidney Disease? yes  History of albuminuria? yes, last UACR = unable to calculate on 12/17/2023 but has a history  Taking SGLT-2i and/or GLP- 1 RA? As above  Taking ACEi/ARB? As above      Current Outpatient Medications:     acetaminophen  (TYLENOL ) 500 MG tablet, Take 2 tablets (1,000 mg total) by mouth every six (6) hours as needed for pain. Extra strength tylenol , Disp: , Rfl:     albuterol  HFA 90 mcg/actuation inhaler, Inhale 2 puffs every six (6) hours as needed., Disp: 8 g, Rfl: 0    alcohol  swabs  (ALCOHOL  PADS) PadM, Apply 1 Swab topically Three (3) times a day before meals., Disp: 100 each,  Rfl: 3    blood sugar diagnostic (ACCU-CHEK GUIDE TEST STRIPS) Strp, Check sugars before meals three times for insulin  dependent type two diabetes. E11.65, Disp: 100 each, Rfl: 11    blood-glucose sensor (DEXCOM G7 SENSOR) Devi, Change sensor every 10 days., Disp: 9 each, Rfl: 3    bosutinib 500 mg Tab, Take 1 tablet (500 mg total) by mouth once daily. Administer with food. Swallow tablet whole; do not cut, crush, break, or chew., Disp: 30 tablet, Rfl: 2    calcium  carbonate-vitamin D3 600 mg-20 mcg (800 unit) Tab, Take 1 mg by mouth Two (2) times a day (at 8am and 12:00)., Disp: , Rfl:     carvedilol  (COREG ) 6.25 MG tablet, TAKE ONE TABLET BY MOUTH EVERY MORNING AND ONE EVERY EVENING WITH MEALS, Disp: 60 tablet, Rfl: 6    cetirizine  (ZYRTEC ) 10 MG tablet, TAKE 1 TABLET BY MOUTH IN THE MORNING., Disp: 90 tablet, Rfl: 3    cholecalciferol, vitamin D3, (VITAMIN D3 ORAL), Take by mouth., Disp: , Rfl:     clonazePAM  (KLONOPIN ) 0.5 MG tablet, Take 1 tablet (0.5 mg total) by mouth daily as needed. PRN, Disp: , Rfl:     cyanocobalamin , vitamin B-12, 1,000 mcg/mL Kit, Inject 1,000 mcg as directed every thirty (30) days., Disp: 3 kit, Rfl: 4    cyclobenzaprine  (FLEXERIL ) 5 MG tablet, Take 1 tablet (5 mg total) by mouth Three (3) times a day as needed (lower back pain)., Disp: 90 tablet, Rfl: 1    dapagliflozin  propanediol (FARXIGA ) 10 mg Tab tablet, Take 1 tablet (10 mg total) by mouth every morning., Disp: 90 tablet, Rfl: 3    divalproex  ER (DEPAKOTE  ER) 500 MG extended released 24 hr tablet, TAKE 1 TABLET BY MOUTH AT BEDTIME, Disp: 90 tablet, Rfl: 3    ferrous sulfate  325 (65 FE) MG tablet, Take 1 tablet (325 mg total) by mouth in the morning., Disp: , Rfl:     fluticasone  propionate (FLONASE ) 50 mcg/actuation nasal spray, 2 sprays into each nostril daily. (Patient taking differently: 2 sprays into each nostril as needed.), Disp: 16 g, Rfl: 0    hydrOXYzine  (VISTARIL ) 50 MG capsule, Take 2 capsules (100 mg total) by mouth nightly. And takes PRN (Patient taking differently: Take 4 capsules (200 mg total) by mouth nightly. And takes PRN), Disp: , Rfl:     insulin  regular hum U-500 conc (HUMULIN  R U-500, CONC, KWIKPEN) 500 unit/mL (3 mL) CONCENTRATED injection, Inject 150 Units under the skin in the morning. (Patient taking differently: Inject 100 Units under the skin in the morning.), Disp: 30 mL, Rfl: 3    lamoTRIgine  (LAMICTAL ) 150 MG tablet, Take 1 tablet (150 mg total) by mouth two (2) times a day., Disp: , Rfl:     lancets (ACCU-CHEK SOFTCLIX LANCETS) Misc, Check sugar three times per day before meals for 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 Every 3 months, Disp: , Rfl:     metFORMIN  (GLUCOPHAGE ) 1000 MG tablet, TAKE ONE TABLET BY MOUTH TWICE DAILY IN THE MORNING AND IN THE EVENING. TAKE WITH MEALS, Disp: 180 tablet, Rfl: 3    norethindrone  (AYGESTIN ) 5 mg tablet, TAKE ONE TABLET EVERY MORNING, Disp: 30 tablet, Rfl: 11    ondansetron  (ZOFRAN -ODT) 4 MG disintegrating tablet, Take 1 tablet (4 mg total) by mouth every eight (8) hours as needed for nausea., Disp: 30 tablet, Rfl: 1  pantoprazole  (PROTONIX ) 40 MG tablet, TAKE 1 TABLET BY MOUTH IN THE MORNING., Disp: 90 tablet, Rfl: 3    pregabalin  (LYRICA ) 75 MG capsule, TAKE 1 CAPSULE BY MOUTH IN THE MORNING AND 2 CAPSULES BY MOUTH IN THE EVENING., Disp: 270 capsule, Rfl: 0    TECHLITE PEN NEEDLE 32 gauge x 1/4 (6 mm) Ndle, USE 3 TIMES DAILY AS DIRECTED, Disp: 300 each, Rfl: 4    tirzepatide  (MOUNJARO ) 12.5 mg/0.5 mL PnIj, Inject 12.5 mg under the skin every seven (7) days., Disp: 2 mL, Rfl: 12    torsemide  (DEMADEX ) 20 MG tablet, TAKE 4 TABLETS BY MOUTH ONCE DAILY, Disp: 120 tablet, Rfl: 6    traZODone  (DESYREL ) 100 MG tablet, TAKE 2 TABLETS BY MOUTH AT BEDTIME, Disp: 180 tablet, Rfl: 3    ziprasidone  (GEODON ) 80 MG capsule, Take 1 capsule (80 mg total) by mouth in the morning and 1 capsule (80 mg total) in the evening. Take with meals., Disp: , Rfl:   No current facility-administered medications for this visit.    Facility-Administered Medications Ordered in Other Visits:     leuprolide  (LUPRON ) injection 11.25 mg, 11.25 mg, Intramuscular, Once, Coombs, Damaris Duhamel, MD    Objective:   Vitals:    Vitals:    03/02/24 1100   BP: 112/77   BP Position: Sitting   Pulse: 75   Weight: 153.9 kg (339 lb 3.2 oz)       Past Medical History:    Active Ambulatory Problems     Diagnosis Date Noted    Esophageal reflux 08/15/2008    Essential (primary) hypertension 09/08/2006    Polycystic ovarian syndrome 09/28/2016    Bipolar I disorder: With psychotic features, Current or most recent episode depressed, with mixed features (CMS-HCC) 06/15/2017    Anxiety disorder 06/15/2017    Decreased hearing of both ears 09/11/2017    Seborrheic dermatitis of scalp 09/11/2017    Pelvic mass 06/23/2019    Therapeutic drug monitoring 08/03/2020    H/O non anemic vitamin B12 deficiency 08/03/2020    Schizo affective schizophrenia     PTSD (post-traumatic stress disorder) 10/08/2021    Class 3 severe obesity due to excess calories with serious comorbidity and body mass index (BMI) of 45.0 to 49.9 in adult 11/19/2020    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    Elevated platelet count 02/06/2021    Type 2 diabetes mellitus with diabetic neuropathy, with long-term current use of insulin  03/27/2020    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    NASH (nonalcoholic steatohepatitis) 02/19/2021    Focal nodular hyperplasia of liver 03/05/2021    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    Drug-induced constipation 07/25/2021    At risk for prolonged QT interval syndrome 10/08/2021    Cow's milk intolerance 07/08/2019    Gastroparesis 08/26/2022    Insomnia 08/26/2022    Prominent metatarsal head of left foot 08/26/2022    Low vitamin D level 08/26/2022    Primary stabbing headache 12/03/2022    Restrictive airway disease 01/09/2023    Brain atrophy (CMS-HCC) 01/09/2023    Shortness of breath 01/15/2023    Pulmonary hypertension 02/19/2023    Hyperkalemia 03/13/2023    Chronic renal impairment, stage 3a (CMS-HCC) 05/06/2023    Allergic reaction to Augmentin  05/06/2023    (HFpEF) heart failure with preserved ejection fraction 05/06/2023    Rapid weight loss 11/24/2023    Onycholysis of toenail 11/24/2023    Low vitamin B12 level 12/31/2023     Resolved Ambulatory Problems  Diagnosis Date Noted    Borderline personality disorder 06/03/2011    Depressive disorder 10/04/2004    Diabetes mellitus without complication 04/22/2011    Diarrhea 11/15/2008    Iron  deficiency anemia 02/03/2011    Abnormal Pap smear 09/28/2012    Morbid obesity (CMS-HCC) 04/08/2006    Cervicalgia 11/03/2012    Obstructive sleep apnea 02/20/2010    Mononeuritis of lower limb 02/17/2011    B-complex deficiency 08/18/2012 Peripheral neuropathy 03/14/2013    B12 deficiency anemia     Pyelonephritis 06/10/2013    Malaise 09/02/2013    Menorrhagia 11/09/2013    Skin tag 11/09/2013    Breast lump 11/23/2013    Lower urinary tract infectious disease 11/23/2013    Lesion of liver 12/21/2013    Fibrocystic breast 01/24/2014    Diarrhea 03/06/2014    Vomiting 03/15/2014    Parotitis 03/30/2014    Migraine 07/14/2014    Urinary frequency 08/07/2014    Fullness in ear 08/07/2014    Cystitis 09/11/2014    Episodic tension-type headache 09/11/2014    Neck pain 01/04/2015    Palpitation 01/09/2015    Vaginal discharge 01/22/2016    Screen for STD (sexually transmitted disease) 01/22/2016    Mass of right lower extremity 02/22/2016    Chronic fatigue 02/22/2016    Rash and nonspecific skin eruption 04/29/2016    Chronic pain of right knee 08/29/2016    Acute cystitis without hematuria 01/05/2017    Bipolar II Disorder 02/10/2017    Foot infection 08/24/2017    Atypical chest pain 10/15/2017    Impetigo 10/15/2017    Abdominal pain, RUQ 01/08/2018    Chronic diarrhea 01/25/2018    Ringworm of body 05/28/2018    Polycystic ovaries 08/05/2018    Dysuria 06/23/2019    Morbid obesity with body mass index (BMI) of 50.0 to 59.9 in adult (CMS-HCC) 01/02/2020    Acute renal failure 03/30/2020    Sepsis 04/07/2020    Encounter to establish care 04/27/2020    Flu vaccine need 08/03/2020    Diarrhea 08/03/2020    Urinary frequency 02/04/2021    Uncontrolled type 2 diabetes mellitus with hyperglycemia 02/19/2021    Toenail deformity 05/03/2021    Encounter for weight loss counseling 12/04/2021    Inappropriate diet and eating habits 05/15/2022    Acute viral syndrome 08/26/2022    Low back strain 02/28/2020    Chronic midline low back pain without sciatica 08/26/2022    Diabetic ulcer of toe of right foot associated with type 2 diabetes mellitus, with muscle involvement without evidence of necrosis 08/26/2022    Impacted cerumen of right ear 12/03/2022    AKI (acute kidney injury) 01/01/2023    Dog bite, hand, unspecified laterality, sequela 05/06/2023     Past Medical History:   Diagnosis Date    Anemia     Anxiety     Arthritis     Binge eating     Bipolar disorder     Cancer     CHF (congestive heart failure)     Chronic kidney disease     Diabetes mellitus     Fatty liver     GERD (gastroesophageal reflux disease)     Hyperlipidemia     Hypertension     Major depressive disorder     Obesity     Obsessive-compulsive disorder     Panic disorder     Prior Outpatient Treatment/Testing 06/15/2017    Psychiatric Hospitalizations 06/15/2017    Psychiatric  Medication Trials 06/15/2017    Pulmonary arterial hypertension     Restrictive lung disease     Self-injurious behavior 06/15/2017    Suicidal ideation 06/15/2017       Wt Readings from Last 3 Encounters:   03/02/24 153.9 kg (339 lb 3.2 oz)   02/26/24 (!) 151.7 kg (334 lb 8 oz)   02/17/24 (!) 152.8 kg (336 lb 12.8 oz)       Lab Results   Component Value Date    A1C 6.6 02/02/2024    A1C 6.6 10/30/2023    A1C 7.3 (H) 07/17/2023    A1C 7.2 (H) 07/17/2023    A1C 10.6 (H) 03/10/2023    A1C 8.2 (H) 12/03/2022    A1C 9.6 (H) 08/26/2022    A1C 7.0 (H) 01/14/2022       Lab Results   Component Value Date    NA 140 01/11/2024    K 3.9 01/11/2024    CL 96 (L) 01/11/2024    CO2 32.0 (H) 01/11/2024    BUN 16 01/11/2024    CREATININE 0.85 01/11/2024    GFR >= 60 02/11/2013    GLU 230 (H) 01/11/2024    CALCIUM  10.4 01/11/2024    ALBUMIN 3.8 01/11/2024    PHOS 3.7 12/17/2023       Lab Results   Component Value Date    ALKPHOS 232 (H) 01/11/2024    BILITOT 0.3 01/11/2024    BILIDIR 0.10 12/31/2022    PROT 7.9 01/11/2024    ALBUMIN 3.8 01/11/2024    ALT 25 01/11/2024    AST 37 (H) 01/11/2024       Albumin/Creatinine Ratio (no units)   Date Value   12/17/2023      Comment:     Unable to calculate.       Lab Results   Component Value Date    CHOL 215 (H) 07/31/2023    CHOL 166 08/26/2022    CHOL 207 (H) 05/14/2021     Lab Results   Component Value Date    HDL 28 (L) 07/31/2023    HDL 30 (L) 08/26/2022    HDL 42 05/14/2021     Lab Results   Component Value Date    LDL  07/31/2023      Comment:      Unable to calculate due to triglyceride greater than 400 mg/dL.    LDL 89.0 07/31/2023    LDL 81 08/26/2022     Lab Results   Component Value Date    VLDL  07/31/2023      Comment:      Unable to calculate due to triglyceride greater than 400 mg/dL.    VLDL 54.8 (H) 08/26/2022    VLDL 64.4 (H) 05/14/2021     Lab Results   Component Value Date    CHOLHDLRATIO 7.7 (H) 07/31/2023    CHOLHDLRATIO 5.5 (H) 08/26/2022    CHOLHDLRATIO 4.9 (H) 05/14/2021     Lab Results   Component Value Date    TRIG 721 (H) 07/31/2023    TRIG 274 (H) 08/26/2022    TRIG 322 (H) 05/14/2021       The ASCVD Risk score (Arnett DK, et al., 2019) failed to calculate for the following reasons:    The 2019 ASCVD risk score is only valid for ages 2 to 72    Note: For patients with SBP <90 or >200, Total Cholesterol <130 or >320, HDL <20 or >100 which are outside of the  allowable range, the calculator will use these upper or lower values to calculate the patient???s risk score.

## 2024-03-01 DIAGNOSIS — C9210 Chronic myeloid leukemia, BCR/ABL-positive, not having achieved remission: Principal | ICD-10-CM

## 2024-03-01 MED ORDER — CETIRIZINE 10 MG TABLET
ORAL_TABLET | Freq: Every morning | ORAL | 3 refills | 90.00 days | Status: CP
Start: 2024-03-01 — End: ?

## 2024-03-02 ENCOUNTER — Ambulatory Visit
Admit: 2024-03-02 | Discharge: 2024-03-03 | Payer: Medicaid (Managed Care) | Attending: Ambulatory Care | Primary: Ambulatory Care

## 2024-03-02 DIAGNOSIS — E1140 Type 2 diabetes mellitus with diabetic neuropathy, unspecified: Principal | ICD-10-CM

## 2024-03-02 DIAGNOSIS — Z794 Long term (current) use of insulin: Principal | ICD-10-CM

## 2024-03-02 DIAGNOSIS — C9210 Chronic myeloid leukemia, BCR/ABL-positive, not having achieved remission: Principal | ICD-10-CM

## 2024-03-02 MED ORDER — MOUNJARO 12.5 MG/0.5 ML SUBCUTANEOUS PEN INJECTOR
SUBCUTANEOUS | 12 refills | 0.00 days | Status: CP
Start: 2024-03-02 — End: ?

## 2024-03-02 NOTE — Unmapped (Signed)
 Ms. Shamina Etheridge,    It was a pleasure to see you today! As we discussed:     Please INCREASE Mounjaro  to 12.5 mg under the skin once weekly.    Please DECREASE the Humulin  R U-500 insulin  to 85 units under the skin every morning.   May decrease to 80 units if still having low blood sugars in the afternoon/evening after 1 week.    Please continue Farxiga  5 mg by mouth once daily.    Please continue metformin  1000 mg by mouth twice daily.    Contact the clinic if you are having persistently low blood sugar readings <70 mg/dl or if you are having symptoms of low blood sugar.    Follow up with Dr. Beauford Bounds MD in early July 2025.    Follow up with clinical pharmacist virtually after your appointment on May 9 with the Bariatric surgery team.     Livia Riffle?? PharmD, CPP, BCACP, CDCES  Clinical Pharmacist Practitioner - Endocrinology  [facility]  Phone: [phone] - Fax: [phone]

## 2024-03-02 NOTE — Unmapped (Signed)
 [facility] has made a second and final attempt to reach this patient to refill the following medication:Bosulif .      We have left voicemails on the following phone numbers: [phone], have sent a MyChart message, have sent a text message to the following phone numbers: [phone], and have sent a Mychart questionnaire..    Dates contacted: 4/9,16  Last scheduled delivery: 02/05/24    The patient may be at risk of non-compliance with this medication. The patient should call the [facility] at [phone]  Option 4, then Option 1: Oncology to refill medication.    [facility] Specialty Technician

## 2024-03-03 NOTE — Unmapped (Signed)
 I was the supervising physician in the delivery of the service. Nila Nephew, MD

## 2024-03-07 DIAGNOSIS — C9210 Chronic myeloid leukemia, BCR/ABL-positive, not having achieved remission: Principal | ICD-10-CM

## 2024-03-07 MED ORDER — BOSUTINIB 500 MG TABLET
ORAL_TABLET | 2 refills | 0.00 days | Status: CP
Start: 2024-03-07 — End: ?
  Filled 2024-03-16: qty 30, 30d supply, fill #0

## 2024-03-07 NOTE — Unmapped (Signed)
 [facility] Refill Coordination Note    Specialty Medication(s) to be Shipped:   Hematology/Oncology: Bosulif     Other medication(s) to be shipped: No additional medications requested for fill at this time     Jenna Mosley, DOB: [birth year]  Phone: [phone] (home)       All above HIPAA information was verified with patient.     Was a Nurse, learning disability used for this call? No    Completed refill call assessment today to schedule patient's medication shipment from the [facility]  [phone]).  All relevant notes have been reviewed.     Specialty medication(s) and dose(s) confirmed: Regimen is correct and unchanged.   Changes to medications: Cooper reports no changes at this time.  Changes to insurance: No  New side effects reported not previously addressed with a pharmacist or physician: None reported  Questions for the pharmacist: No    Confirmed patient received a Conservation officer, historic buildings and a Surveyor, mining with first shipment. The patient will receive a drug information handout for each medication shipped and additional FDA Medication Guides as required.       DISEASE/MEDICATION-SPECIFIC INFORMATION        N/A    SPECIALTY MEDICATION ADHERENCE     Medication Adherence    Patient reported X missed doses in the last month: 1-2  Specialty Medication: BOSULIF  500 mg Tab (bosutinib)  Patient is on additional specialty medications: No              Were doses missed due to medication being on hold? No    BOSULIF  500 mg Tab (bosutinib) 3 doses of medicine on hand       REFERRAL TO PHARMACIST     Referral to the pharmacist: Not needed      [facility]     Shipping address confirmed in Epic.     Cost and Payment: Patient has a copay of $4.00. They are aware and have authorized the pharmacy to charge the credit card on file.    Delivery Scheduled: Yes, Expected medication delivery date: 03/09/2024.     Medication will be delivered via Same Day Courier to the prescription address in Epic WAM.    [facility]  Specialty Technician

## 2024-03-07 NOTE — Unmapped (Signed)
 This pharmacist was notified by a technician that this patient has reported that they've missed 1-2 doses of their Bosulif .. I have reviewed the patient's medical record and have determined that no further pharmacist action is needed.      Approximate time spent: 0-5 minutes    Wilbern Hancock, [facility], Clinical Specialty Pharmacist  [facility]

## 2024-03-11 DIAGNOSIS — C9210 Chronic myeloid leukemia, BCR/ABL-positive, not having achieved remission: Principal | ICD-10-CM

## 2024-03-11 NOTE — Unmapped (Signed)
 Jenna Mosley 's Bosulif  shipment will be delayed as a result of not on formulary with their insurance.    I have reached out to the patient  at [phone]  and communicated the delay. We will call the patient back to reschedule the delivery upon resolution. We have not confirmed the new delivery date.      Aijah Lattner, PharmD  [facility]

## 2024-03-15 DIAGNOSIS — C9210 Chronic myeloid leukemia, BCR/ABL-positive, not having achieved remission: Principal | ICD-10-CM

## 2024-03-15 NOTE — Unmapped (Signed)
 I called insurance/PBM today at [phone] to see if I can provide override needed for Ms. Ureta's bosutinib. I received an override with the dates of 02/23/24-05/14/24 and then the rep said we may need override after that but for now received a paid claim.     Time spent in coordination of care: 20 minutes    Glynis Lass, PharmD, BCOP, CPP  Hematology/Oncology Clinical Pharmacist  Pager [phone]

## 2024-03-16 NOTE — Unmapped (Signed)
 Jenna Mosley 's BOSULIF  500 mg Tab (bosutinib) shipment will be rescheduled as a result of prior authorization now approved.     I have reached out to the patient  at [phone]  and communicated the delivery change. We will reschedule the medication for the delivery date that the patient agreed upon.  We have confirmed the delivery date as 03/16/24, via same day courier.

## 2024-03-25 ENCOUNTER — Ambulatory Visit
Admit: 2024-03-25 | Discharge: 2024-03-26 | Payer: Medicaid (Managed Care) | Attending: Student in an Organized Health Care Education/Training Program | Primary: Student in an Organized Health Care Education/Training Program

## 2024-03-25 DIAGNOSIS — K219 Gastro-esophageal reflux disease without esophagitis: Principal | ICD-10-CM

## 2024-03-25 DIAGNOSIS — E1140 Type 2 diabetes mellitus with diabetic neuropathy, unspecified: Principal | ICD-10-CM

## 2024-03-25 DIAGNOSIS — Z794 Long term (current) use of insulin: Principal | ICD-10-CM

## 2024-03-25 DIAGNOSIS — E66813 Class 3 severe obesity due to excess calories with serious comorbidity and body mass index (BMI) of 45.0 to 49.9 in adult: Principal | ICD-10-CM

## 2024-03-25 DIAGNOSIS — Z6841 Body Mass Index (BMI) 40.0 and over, adult: Principal | ICD-10-CM

## 2024-03-25 LAB — COMPREHENSIVE METABOLIC PANEL
ALBUMIN: 4.2 g/dL (ref 3.4–5.0)
ALKALINE PHOSPHATASE: 233 U/L — ABNORMAL HIGH (ref 46–116)
ALT (SGPT): 24 U/L (ref 10–49)
ANION GAP: 10 mmol/L (ref 5–14)
AST (SGOT): 29 U/L (ref <=34)
BILIRUBIN TOTAL: 0.3 mg/dL (ref 0.3–1.2)
BLOOD UREA NITROGEN: 26 mg/dL — ABNORMAL HIGH (ref 9–23)
BUN / CREAT RATIO: 22
CALCIUM: 10.4 mg/dL (ref 8.7–10.4)
CHLORIDE: 100 mmol/L (ref 98–107)
CO2: 28 mmol/L (ref 20.0–31.0)
CREATININE: 1.2 mg/dL — ABNORMAL HIGH (ref 0.55–1.02)
EGFR CKD-EPI (2021) FEMALE: 61 mL/min/1.73m2 (ref >=60)
GLUCOSE RANDOM: 101 mg/dL (ref 70–179)
POTASSIUM: 4.1 mmol/L (ref 3.5–5.1)
PROTEIN TOTAL: 7.9 g/dL (ref 5.7–8.2)
SODIUM: 138 mmol/L (ref 135–145)

## 2024-03-25 LAB — TOXICOLOGY SCREEN, URINE
AMPHETAMINE SCREEN URINE: NEGATIVE
BARBITURATE SCREEN URINE: NEGATIVE
BENZODIAZEPINE SCREEN, URINE: NEGATIVE
BUPRENORPHINE, URINE SCREEN: NEGATIVE
CANNABINOID SCREEN URINE: NEGATIVE
COCAINE(METAB.)SCREEN, URINE: NEGATIVE
FENTANYL SCREEN, URINE: NEGATIVE
METHADONE SCREEN, URINE: NEGATIVE
OPIATE SCREEN URINE: NEGATIVE
OXYCODONE SCREEN URINE: NEGATIVE

## 2024-03-25 LAB — CBC
HEMATOCRIT: 41.5 % (ref 34.0–44.0)
HEMOGLOBIN: 14.1 g/dL (ref 11.3–14.9)
MEAN CORPUSCULAR HEMOGLOBIN CONC: 34 g/dL (ref 32.0–36.0)
MEAN CORPUSCULAR HEMOGLOBIN: 28.6 pg (ref 25.9–32.4)
MEAN CORPUSCULAR VOLUME: 84.3 fL (ref 77.6–95.7)
MEAN PLATELET VOLUME: 8.3 fL (ref 6.8–10.7)
PLATELET COUNT: 282 10*9/L (ref 150–450)
RED BLOOD CELL COUNT: 4.93 10*12/L (ref 3.95–5.13)
RED CELL DISTRIBUTION WIDTH: 14.1 % (ref 12.2–15.2)
WBC ADJUSTED: 10.1 10*9/L (ref 3.6–11.2)

## 2024-03-25 LAB — HEMOGLOBIN A1C
ESTIMATED AVERAGE GLUCOSE: 117 mg/dL
HEMOGLOBIN A1C: 5.7 % — ABNORMAL HIGH (ref 4.8–5.6)

## 2024-03-25 LAB — IRON & TIBC
IRON SATURATION: 13 % — ABNORMAL LOW (ref 20–55)
IRON: 49 ug/dL — ABNORMAL LOW (ref 50–170)
TOTAL IRON BINDING CAPACITY: 370 ug/dL (ref 250–425)

## 2024-03-25 LAB — FERRITIN: FERRITIN: 89.7 ng/mL (ref 7.3–270.7)

## 2024-03-25 LAB — TSH: THYROID STIMULATING HORMONE: 1.215 u[IU]/mL (ref 0.550–4.780)

## 2024-03-25 LAB — PREALBUMIN: PREALBUMIN: 24.6 mg/dL (ref 10.0–40.0)

## 2024-03-25 NOTE — Unmapped (Addendum)
 Your surgery has been scheduled 06-01-2024  You will receive a call the day before your surgery between the hours of 2 and 6 to advise you of your time to arrive to the surgical hospital.      Please start the liver reduction diet 14 days prior to your surgery date.   On the morning of your surgery, only take the medicines that the Pre-Arrival associate told you were OK to take.  Take them with a sip of water.    One (1) hour prior to your scheduled arrive time, drink the Ensure-Clear Pre Surgery Drink.      The night before your surgery and the morning of please take a shower with any anti-bacterial soap.    On the morning of your surgery, only take the medicines that the Pre-Arrival associate told you were OK to take.  Take them with a sip of water.    The night before your surgery and the morning of please take a shower with any anti-bacterial soap.      If you have any questions regarding your visit today please feel free to contact me at the number listed below.     Honora Lutes, RN, Bariatric Coordinator  Surgical RN Coordinator for Dr. Peyton Brash   [email or web address]  Phone:  [phone]  Fax:       [phone]
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
## 2024-03-28 DIAGNOSIS — Z794 Long term (current) use of insulin: Principal | ICD-10-CM

## 2024-03-28 DIAGNOSIS — E1165 Type 2 diabetes mellitus with hyperglycemia: Principal | ICD-10-CM

## 2024-03-28 MED ORDER — METFORMIN 1,000 MG TABLET
ORAL_TABLET | ORAL | 3 refills | 0.00000 days | Status: CP
Start: 2024-03-28 — End: ?

## 2024-03-28 NOTE — Unmapped (Signed)
 Adult Refill: Patient is requesting the following refill  Requested Prescriptions     Pending Prescriptions Disp Refills    metFORMIN  (GLUCOPHAGE ) 1000 MG tablet [Pharmacy Med Name: METFORMIN  HCL 1000 MG TAB] 180 tablet 3     Sig: TAKE ONE TABLET BY MOUTH TWICE DAILY IN THE MORNING AND IN THE EVENING. TAKE WITH MEALS       Recent Visits  Date Type Provider Dept   12/31/23 Office Visit Dolph Friar, MD [facility] Internal Medicine [facility] [place]   11/24/23 Office Visit Erdem, Stevphen Elders, MD [facility] Internal Medicine [facility] [place]   11/06/23 Office Visit Andreas Bandy, MD [facility] Internal Medicine [facility] [place]   05/06/23 Office Visit Erdem, Stevphen Elders, MD [place] Internal Medicine [facility]   Showing recent visits within past 365 days and meeting all other requirements  Future Appointments  No visits were found meeting these conditions.  Showing future appointments within next 365 days and meeting all other requirements       Labs: A1c:   HB A1C, RAP/HGATE (%)   Date Value   04/22/2011 6.4 (H)     HGB A1C, POC (%)   Date Value   02/02/2024 6.6     Hemoglobin A1C (%)   Date Value   03/25/2024 5.7 (H)    Vitals:   BP Readings from Last 3 Encounters:   03/25/24 119/75   03/02/24 112/77   02/26/24 119/71    and   Pulse Readings from Last 3 Encounters:   03/25/24 77   03/02/24 75   02/26/24 66

## 2024-03-28 NOTE — Unmapped (Signed)
 EGD  Procedure #1     Procedure #2   [id number]  MRN   HATHORN  Endoscopist     Is the patient's health insurance [employer], [employer], [place] [employer] ([employer]), [employer] [place], [employer], or [employer]?     Urgent procedure     Are you pregnant?     Are you in the process of scheduling or awaiting results of a heart ultrasound, stress test, or catheterization to evaluate new or worsening chest pain, dizziness, or shortness of breath?     Do you take: Plavix (clopidogrel), Coumadin (warfarin), Lovenox  (enoxaparin ), Pradaxa (dabigatran), Effient (prasugrel), Xarelto (rivaroxaban), Eliquis (apixaban), Pletal (cilostazol), or Brilinta (ticagrelor)?          Did ordering provider indicate how long to hold this medication in the order comments?          Which of the above medications are you taking?          What is the name of the medical practice that manages this medication?          What is the name of the medical provider who manages this medication?     Do you have hemophilia, von Willebrand disease, or low platelets?     Do you have a pacemaker or implanted cardiac defibrillator?     Has a [facility] provider specified the location(s)?     Which location(s) did the [facility] provider specify?          [facility]          [place]          [facility]      Do you see a liver specialist for chronic liver disease?     Is the procedure indication for variceal screening?     Is procedure indication for variceal banding (this does NOT include variceal screening)?     Have you had a heart attack, stroke or heart stent placement within the past 6 months?     Month of event     Year of event (ONLY ENTER LAST 2 DIGITS)        6  Height (feet)     Height (inches)   325  Weight (pounds)   44.1  BMI          Did the ordering provider specify a bowel prep?          What bowel prep was specified?     Do you have an ostomy (bag on your stomach that collects your stool)?          Is it an ileostomy?          Is it a colostomy? Patient doesn't know.     Do you have chronic kidney disease?     Do you have chronic constipation or have you had poor quality bowel preps for past colonoscopies?     Do you have Crohn's disease or ulcerative colitis?     Have you had weight loss surgery?          When you walk around your house or grocery store, do you have to stop and rest due to shortness of breath, chest pain, or light-headedness?     Do you ever use supplemental oxygen?     Have you been hospitalized for cirrhosis of the liver or heart failure in the last 12 months?     Have you been treated for mouth or throat cancer with radiation or surgery?  Have you been told that it is difficult for doctors to insert a breathing tube in you during anesthesia?     Have you had a heart or lung transplant?          Are you on dialysis?     Do you have cirrhosis of the liver?     Do you have myasthenia gravis?     Is the patient a prisoner?   ################# ## ###################################################################################################################   MRN:  [id number]   Anticoag Review  No   Nurse Triage  No   GI clinic consult  No   Procedure(s):  EGD     0   Endoscopist:  HATHORN   Urgent:  No   Prep:                    --------------------------- --- ----------------------------------------------------------------------------------------------------------------------------------------------------------------------------   [facility] Locations:  [facility]     [facility]      [place]        Requested Locations:              ################# ## ###################################################################################################################

## 2024-03-29 LAB — ZINC: ZINC: 81 ug/dL

## 2024-03-29 LAB — COPPER, SERUM: COPPER: 176 ug/dL

## 2024-03-29 NOTE — Unmapped (Signed)
 Call placed attempt to schedule [facility], pt away from home and wants to check her calendar. Plans to call back once calendar available and she has return number for PPS clinic.

## 2024-03-30 DIAGNOSIS — C9210 Chronic myeloid leukemia, BCR/ABL-positive, not having achieved remission: Principal | ICD-10-CM

## 2024-03-30 LAB — NICOTINE/COTININE, SERUM
COTININE: <3 ng/mL
NICOTINE: <3 ng/mL

## 2024-03-30 LAB — VITAMIN D 25 HYDROXY: VITAMIN D, TOTAL (25OH): 41 ng/mL (ref 20.0–80.0)

## 2024-03-31 NOTE — Unmapped (Signed)
 Telephone call from patient's mother to schedule PPS appointment.    Patient in agreement with:    Date: 04/26/24  Time: 1400  Location: [address] [place], [place] [address]    Will retrieve address from MyChart.

## 2024-04-01 ENCOUNTER — Encounter
Admit: 2024-04-01 | Discharge: 2024-04-02 | Payer: Medicaid (Managed Care) | Attending: Ambulatory Care | Primary: Ambulatory Care

## 2024-04-01 DIAGNOSIS — E1140 Type 2 diabetes mellitus with diabetic neuropathy, unspecified: Principal | ICD-10-CM

## 2024-04-01 DIAGNOSIS — C9210 Chronic myeloid leukemia, BCR/ABL-positive, not having achieved remission: Principal | ICD-10-CM

## 2024-04-01 DIAGNOSIS — E119 Type 2 diabetes mellitus without complications: Principal | ICD-10-CM

## 2024-04-01 DIAGNOSIS — E1165 Type 2 diabetes mellitus with hyperglycemia: Principal | ICD-10-CM

## 2024-04-01 DIAGNOSIS — Z794 Long term (current) use of insulin: Principal | ICD-10-CM

## 2024-04-01 MED ORDER — DEXCOM G7 SENSOR DEVICE
3 refills | 0.00000 days | Status: CP
Start: 2024-04-01 — End: ?
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
## 2024-04-01 NOTE — Unmapped (Addendum)
 [facility] Endocrinology at [facility]  [address]  [place], [place] [address]    Clinical Pharmacist Visit Summary    Assessment and Plan:   1. Diabetes, type 2: good control.  Last A1c = 5.7 on 03/25/24 (Decreased from 6.6% on 02/02/2024) with goal <7% without hypoglycemia.  CGM data show good, but upward trending BG with ABG 153 mg/dl, GMI 4.1%, TIR 32% and 0% low/very low. Of note, patient has not had her Dexcom since mid-April as her pharmacy has not it. Review of daily CGM graphs indicate that patient is occasionally having high BG in the late afternoons/early evenings, likely attributed to her insulin  regimen. Will send script for CGM to [facility] pharmacy per patient's request to help with consistent access to devices.  Patient has not had BG <70 mg/dL but has experienced symptoms of low BG. A few weeks ago, she had a PPBG ~110 mg/dL ~2 hrs after meal consisting of corn and fried chicken sandwich (felt sx of low BG), woke up at 1am with symptoms of low BG but was unable to check as she did not have her meter   Takes Mounjaro  12.5 mg weekly, Farxiga  5 mg daily, metformin  1000 mg BID, and U-500 insulin  100 units qAM with good adherence and without adverse effects or affordability concerns. She continues to work on dietary changes, physical activity and was recently approved for bariatric surgery (next office visit on 5/9 to schedule surgery date).   Bariatric surgery date is 06/01/24 and patient starts clear diet 2 weeks prior to surgery.   With shared decision making, will continue Farxiga , metformin , and Mounjaro  and taper insulin  by 15% to prevent afternoon/evening lows. Will reach out to Dr. Terral Ferrari to coordinate medication adjustments in preparation for bariatric surgery. Encouraged her to continue with lifestyle modifications as able. She verbalized understanding and agreement to the following plan:  DECREASE Humulin  R U-100 Kwikpen to 85 units subQ every morning for FBG <180 mg/dl.  Continue Mounjaro  to 12.5 mg subQ weekly.  Continue Farxiga  5 mg PO daily.  Continue metformin  1000 mg PO BID.  Continue Dexcom G7 CGM. Reviewed signs/symptoms/treatment of hypoglycemia. Sent a prescription for Dexcom to [facility] to aid with access.  Follow up with Dr. Beauford Bounds MD 06/07/2024.  Follow up with clinical pharmacist 07/15/24.    Elmyra Haggard, PharmD, MPH  PGY1 Pharmacy Resident    I have reviewed the resident's note and agree with the assessment and plan.     [name], PharmD, CPP  Ambulatory Care Generalist    I spent a total of 30 minutes face to face with the patient delivering clinical care and providing education/counseling.     Subjective:   Reason for visit: Establish care with Clinical Pharmacist Practitioner for blood glucose review and adjustment of diabetes regimen as needed.    Jenna Mosley is a 33 y.o. year old female with a history of diabetes type 2 who presents today for a diabetes-related visit. PMH includes HTN, HLD, HFpEF, chronic renal impairment with albuminuriea, gastroparesis, PCOS, anxiety, bipolar disorder, acid reflux, NASH/FLD, [name], pulmonary HTN.    Diabetes Provider and Last Visit Date:  Dr. Beauford Bounds MD on 02/02/2024    At Last Visit:   INCREASE Mounjaro  to 12.5 mg subQ weekly.  DECREASE Humulin  R U-100 Kwikpen to 85 units subQ every morning for FBG <180 mg/dl  Continue Farxiga  5 mg PO daily.  Continue metformin  1000 mg PO BID.    Interval History of Present  Illness:   03/25/24: bariatric surgery scheduled for 06/01/24     Today, patient presents for diabetes follow up accompanied by her mother, who also had bariatric surgery (though has gained 10 lbs due to chronic right knee pain, currently in PT trying to above knee replacement). Patient has been following closely with bariatric surgery team and has been approved for bariatric surgery; surgery is scheduled for 06/01/24. She reports adherence to Mounjaro  12.5 mg weekly (has had 2 doses of the Mounjaro  12.5mg  dose), Farxiga  5 mg daily, metformin  1000 mg BID, and U-500 insulin  100 units qAM. She was advised to decrease Humulin  to 85 units with increased Mounjaro  dose, but did not change her Humulin  dose. Denies missed doses. Reports some side effects with increased Mounjaro  dose. Reports eating fewer meals per day and has had some nausea/lightheadedness.Denies adverse effects of other medications. Denies affordability concerns (has [employer] Medicaid Managed Care insurance). Checks BG using Dexcom G7 CGM but she has not had Dexcom in >2 weeks as it has been out of stock at pharmacy. Patient inquired about medication adjustment during the peri-operative period as she will be on a liquid diet for 2 weeks prior to surgery and is worried she may not be able to treat potential low BG. Diet and exercise as documented below. Discussed the importance of medication adherence, diet, and exercise to improve glycemic control. She  had no additional questions or concerns.     Diet (Typical):  Wakes up 10:30 - 11 AM  Breakfast: Usually skips  Lunch (12 PM): Eggs, chicken, Healthy Choice meals, berries/yogurt, meal replacement bars (Special K bars)  Dinner (5 - 6 PM): Chicken (boneless/skinless thighs), ground [place] tacos, sweet potato roasted cauliflower, broccoli, cabbage stir fry; does not eat rice or noodles or red meat) rarely has keto bread  Snacks: Fruit (strawberries, blueberries, apples), Skinny Pop individual pop.   Beverages: Diet soda, diet juice, diet Kool-Aid, does not drink much water; may have coffee.  04/01/24: no changes    Exercise: Reports regular physical activity 10-15 minutes of inclined walking, 15 minutes seated elliptical, and bikes 1 mile all in one day.; May do arm weights. Has trouble walkinghas swelling in right leg, have degenerative disc disease, has brusitis, back pain.   04/01/24: pain has improved, patient has been going to the gym 6 days/week for the past 2 weeks. Does 1 mile on bike and 1 mile on elliptical.      Social History: Tobacco use: no  Illicit drug use use: no  Alcohol  use: no  Occupation: On disability  Household: Lives with mom, dogs,     POC glucose level today of 168 mg/dl is PC, biscuit and went to the gym.    Glucose Monitoring: Dexcom CGM        Hypoglycemia:    Symptoms of hypoglycemia since last visit: yes, BG 110 mg/dL when she had symptoms   Prior recognition of hypoglycemia symptoms and knowledge of treatment: yes   Treats with:  jelly beans      Current Medications: Reports adherence to the following diabetes medications:   Humulin  R U-500, inject 100 units every morning with FBG goal <180.   Mounjaro  12.5 mg weekly.   Metformin  1000 mg twice a day.   Farxiga  5 mg every day.      Previous Medications: Victoza      CARDIOVASCULAR RISK REDUCTION  History of clinical ASCVD? no  History of heart failure? yes  History of hyperlipidemia? yes  Taking statin? no  Taking aspirin? no  Taking SGLT-2i and/or GLP- 1 RA? yes, Farxiga  5 mg daily and Mounjaro  12.5 mg weekly    BLOOD PRESSURE CONTROL  History of hypertension?: yes  Taking ACEi/ARB? no, severe allergy  to lisinopril    KIDNEY CARE  History of Chronic Kidney Disease? yes  History of albuminuria? yes, last UACR = unable to calculate on 12/17/2023 but has a history  Taking SGLT-2i and/or GLP- 1 RA? As above  Taking ACEi/ARB? As above      Current Outpatient Medications:     acetaminophen  (TYLENOL ) 500 MG tablet, Take 2 tablets (1,000 mg total) by mouth every six (6) hours as needed for pain. Extra strength tylenol , Disp: , Rfl:     albuterol  HFA 90 mcg/actuation inhaler, Inhale 2 puffs every six (6) hours as needed. (Patient not taking: Reported on 03/25/2024), Disp: 8 g, Rfl: 0    alcohol  swabs  (ALCOHOL  PADS) PadM, Apply 1 Swab topically Three (3) times a day before meals., Disp: 100 each, Rfl: 3    blood sugar diagnostic (ACCU-CHEK GUIDE TEST STRIPS) Strp, Check sugars before meals three times for insulin  dependent type two diabetes. E11.65, Disp: 100 each, Rfl: 11 blood-glucose sensor (DEXCOM G7 SENSOR) Devi, Change sensor every 10 days., Disp: 9 each, Rfl: 3    bosutinib 500 mg Tab, Take 1 tablet (500 mg total) by mouth once daily. Administer with food. Swallow tablet whole; do not cut, crush, break, or chew., Disp: 30 tablet, Rfl: 2    bosutinib 500 mg Tab, Take 1 tablet (500 mg total) by mouth once daily. Administer with food. Swallow tablet whole; do not cut, crush, break, or chew., Disp: 30 tablet, Rfl: 2    calcium  carbonate-vitamin D3 600 mg-20 mcg (800 unit) Tab, Take 1 mg by mouth Two (2) times a day (at 8am and 12:00)., Disp: , Rfl:     carvedilol  (COREG ) 6.25 MG tablet, TAKE ONE TABLET BY MOUTH EVERY MORNING AND ONE EVERY EVENING WITH MEALS, Disp: 60 tablet, Rfl: 6    cetirizine  (ZYRTEC ) 10 MG tablet, TAKE 1 TABLET BY MOUTH IN THE MORNING., Disp: 90 tablet, Rfl: 3    cholecalciferol, vitamin D3, (VITAMIN D3 ORAL), Take by mouth., Disp: , Rfl:     clonazePAM  (KLONOPIN ) 0.5 MG tablet, Take 1 tablet (0.5 mg total) by mouth daily as needed. PRN, Disp: , Rfl:     cyanocobalamin , vitamin B-12, 1,000 mcg/mL Kit, Inject 1,000 mcg as directed every thirty (30) days., Disp: 3 kit, Rfl: 4    cyclobenzaprine  (FLEXERIL ) 5 MG tablet, Take 1 tablet (5 mg total) by mouth Three (3) times a day as needed (lower back pain)., Disp: 90 tablet, Rfl: 1    dapagliflozin  propanediol (FARXIGA ) 10 mg Tab tablet, Take 1 tablet (10 mg total) by mouth every morning., Disp: 90 tablet, Rfl: 3    divalproex  ER (DEPAKOTE  ER) 500 MG extended released 24 hr tablet, TAKE 1 TABLET BY MOUTH AT BEDTIME, Disp: 90 tablet, Rfl: 3    ferrous sulfate  325 (65 FE) MG tablet, Take 1 tablet (325 mg total) by mouth in the morning., Disp: , Rfl:     fluticasone  propionate (FLONASE ) 50 mcg/actuation nasal spray, 2 sprays into each nostril daily. (Patient taking differently: 2 sprays into each nostril as needed.), Disp: 16 g, Rfl: 0    hydrOXYzine  (VISTARIL ) 50 MG capsule, Take 2 capsules (100 mg total) by mouth nightly. And takes PRN (Patient taking differently: Take 4 capsules (200 mg total) by mouth nightly. And  takes PRN), Disp: , Rfl:     insulin  regular hum U-500 conc (HUMULIN  R U-500, CONC, KWIKPEN) 500 unit/mL (3 mL) CONCENTRATED injection, Inject 150 Units under the skin in the morning. (Patient taking differently: Inject 100 Units under the skin in the morning.), Disp: 30 mL, Rfl: 3    lamoTRIgine  (LAMICTAL ) 150 MG tablet, Take 1 tablet (150 mg total) by mouth two (2) times a day., Disp: , Rfl:     lancets (ACCU-CHEK SOFTCLIX LANCETS) Misc, Check sugar three times per day before meals for 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 Every 3 months, Disp: , Rfl:     metFORMIN  (GLUCOPHAGE ) 1000 MG tablet, TAKE ONE TABLET BY MOUTH TWICE DAILY IN THE MORNING AND IN THE EVENING. TAKE WITH MEALS, Disp: 180 tablet, Rfl: 3    norethindrone  (AYGESTIN ) 5 mg tablet, TAKE ONE TABLET EVERY MORNING, Disp: 30 tablet, Rfl: 11    ondansetron  (ZOFRAN -ODT) 4 MG disintegrating tablet, Take 1 tablet (4 mg total) by mouth every eight (8) hours as needed for nausea., Disp: 30 tablet, Rfl: 1    pantoprazole  (PROTONIX ) 40 MG tablet, TAKE 1 TABLET BY MOUTH IN THE MORNING., Disp: 90 tablet, Rfl: 3    pregabalin  (LYRICA ) 75 MG capsule, TAKE 1 CAPSULE BY MOUTH IN THE MORNING AND 2 CAPSULES BY MOUTH IN THE EVENING., Disp: 270 capsule, Rfl: 0    TECHLITE PEN NEEDLE 32 gauge x 1/4 (6 mm) Ndle, USE 3 TIMES DAILY AS DIRECTED, Disp: 300 each, Rfl: 4    tirzepatide  (MOUNJARO ) 12.5 mg/0.5 mL PnIj, Inject 12.5 mg under the skin every seven (7) days., Disp: 2 mL, Rfl: 12    torsemide  (DEMADEX ) 20 MG tablet, TAKE 4 TABLETS BY MOUTH ONCE DAILY, Disp: 120 tablet, Rfl: 6    traZODone  (DESYREL ) 100 MG tablet, TAKE 2 TABLETS BY MOUTH AT BEDTIME, Disp: 180 tablet, Rfl: 3    ziprasidone  (GEODON ) 80 MG capsule, Take 1 capsule (80 mg total) by mouth in the morning and 1 capsule (80 mg total) in the evening. Take with meals., Disp: , Rfl:   No current facility-administered medications for this visit.    Facility-Administered Medications Ordered in Other Visits:     leuprolide  (LUPRON ) injection 11.25 mg, 11.25 mg, Intramuscular, Once, Coombs, Damaris Duhamel, MD    Objective:   Vitals:    There were no vitals filed for this visit.      Past Medical History:    Active Ambulatory Problems     Diagnosis Date Noted    Esophageal reflux 08/15/2008    Essential (primary) hypertension 09/08/2006    Polycystic ovarian syndrome 09/28/2016    Bipolar I disorder: With psychotic features, Current or most recent episode depressed, with mixed features (CMS-HCC) 06/15/2017    Anxiety disorder 06/15/2017    Decreased hearing of both ears 09/11/2017    Seborrheic dermatitis of scalp 09/11/2017    Pelvic mass 06/23/2019    Therapeutic drug monitoring 08/03/2020    H/O non anemic vitamin B12 deficiency 08/03/2020    Schizo affective schizophrenia       PTSD (post-traumatic stress disorder) 10/08/2021    Class 3 severe obesity due to excess calories with serious comorbidity and body mass index (BMI) of 45.0 to 49.9 in adult 11/19/2020    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    Elevated platelet count 02/06/2021    Type 2 diabetes mellitus with diabetic neuropathy, with  long-term current use of insulin    03/27/2020    [name] (chronic myelocytic leukemia)   02/19/2021    NASH (nonalcoholic steatohepatitis) 02/19/2021    Focal nodular hyperplasia of liver 03/05/2021    Intestinal malabsorption (HHS-HCC) 05/15/2021    Drug-induced constipation 07/25/2021    At risk for prolonged QT interval syndrome 10/08/2021    Cow's milk intolerance 07/08/2019    Gastroparesis 08/26/2022    Insomnia 08/26/2022    Prominent metatarsal head of left foot 08/26/2022    Low vitamin D level 08/26/2022    Primary stabbing headache 12/03/2022    Restrictive airway disease 01/09/2023    Brain atrophy (CMS-HCC) 01/09/2023    Shortness of breath 01/15/2023    Pulmonary hypertension   02/19/2023    Hyperkalemia 03/13/2023    Chronic renal impairment, stage 3a (CMS-HCC) 05/06/2023    Allergic reaction to Augmentin  05/06/2023    (HFpEF) heart failure with preserved ejection fraction   05/06/2023    Rapid weight loss 11/24/2023    Onycholysis of toenail 11/24/2023    Low vitamin B12 level 12/31/2023     Resolved Ambulatory Problems     Diagnosis Date Noted    Borderline personality disorder   06/03/2011    Depressive disorder 10/04/2004    Diabetes mellitus without complication   04/22/2011    Diarrhea 11/15/2008    Iron  deficiency anemia 02/03/2011    Abnormal Pap smear 09/28/2012    Morbid obesity (CMS-HCC) 04/08/2006    Cervicalgia 11/03/2012    Obstructive sleep apnea 02/20/2010    Mononeuritis of lower limb 02/17/2011    B-complex deficiency 08/18/2012    Peripheral neuropathy 03/14/2013    B12 deficiency anemia     Pyelonephritis 06/10/2013    Malaise 09/02/2013    Menorrhagia 11/09/2013    Skin tag 11/09/2013    Breast lump 11/23/2013    Lower urinary tract infectious disease 11/23/2013    Lesion of liver 12/21/2013    Fibrocystic breast 01/24/2014    Diarrhea 03/06/2014    Vomiting 03/15/2014    Parotitis 03/30/2014    Migraine 07/14/2014    Urinary frequency 08/07/2014    Fullness in ear 08/07/2014    Cystitis 09/11/2014    Episodic tension-type headache 09/11/2014    Neck pain 01/04/2015    Palpitation 01/09/2015    Vaginal discharge 01/22/2016    Screen for STD (sexually transmitted disease) 01/22/2016    Mass of right lower extremity 02/22/2016    Chronic fatigue 02/22/2016    Rash and nonspecific skin eruption 04/29/2016    Chronic pain of right knee 08/29/2016    Acute cystitis without hematuria 01/05/2017    Bipolar II Disorder 02/10/2017    Foot infection 08/24/2017    Atypical chest pain 10/15/2017    Impetigo 10/15/2017    Abdominal pain, RUQ 01/08/2018    Chronic diarrhea 01/25/2018    Ringworm of body 05/28/2018    Polycystic ovaries 08/05/2018    Dysuria 06/23/2019    Morbid obesity with body mass index (BMI) of 50.0 to 59.9 in adult (CMS-HCC) 01/02/2020    Acute renal failure 03/30/2020    Sepsis   04/07/2020    Encounter to establish care 04/27/2020    Flu vaccine need 08/03/2020    Diarrhea 08/03/2020    Urinary frequency 02/04/2021    Uncontrolled type 2 diabetes mellitus with hyperglycemia   02/19/2021    Toenail deformity 05/03/2021    Encounter for weight loss counseling 12/04/2021    Inappropriate diet  and eating habits 05/15/2022    Acute viral syndrome 08/26/2022    Low back strain 02/28/2020    Chronic midline low back pain without sciatica 08/26/2022    Diabetic ulcer of toe of right foot associated with type 2 diabetes mellitus, with muscle involvement without evidence of necrosis   08/26/2022    Impacted cerumen of right ear 12/03/2022    AKI (acute kidney injury) 01/01/2023    Dog bite, hand, unspecified laterality, sequela 05/06/2023     Past Medical History:   Diagnosis Date    Anemia     Anxiety     Arthritis     Binge eating     Bipolar disorder       Cancer       CHF (congestive heart failure)       Chronic kidney disease     Diabetes mellitus       Fatty liver     GERD (gastroesophageal reflux disease)     Hyperlipidemia     Hypertension     Major depressive disorder     Obesity     Obsessive-compulsive disorder     Panic disorder     Prior Outpatient Treatment/Testing 06/15/2017    Psychiatric Hospitalizations 06/15/2017    Psychiatric Medication Trials 06/15/2017    Pulmonary arterial hypertension       Restrictive lung disease     Self-injurious behavior 06/15/2017    Suicidal ideation 06/15/2017       Wt Readings from Last 3 Encounters:   03/25/24 (!) 147.8 kg (325 lb 12.8 oz)   03/02/24 153.9 kg (339 lb 3.2 oz)   02/26/24 (!) 151.7 kg (334 lb 8 oz)       Lab Results   Component Value Date    A1C 5.7 (H) 03/25/2024    A1C 6.6 02/02/2024    A1C 6.6 10/30/2023    A1C 7.3 (H) 07/17/2023    A1C 7.2 (H) 07/17/2023    A1C 10.6 (H) 03/10/2023    A1C 8.2 (H) 12/03/2022    A1C 9.6 (H) 08/26/2022       Lab Results   Component Value Date    NA 138 03/25/2024    K 4.1 03/25/2024    CL 100 03/25/2024    CO2 28.0 03/25/2024    BUN 26 (H) 03/25/2024    CREATININE 1.20 (H) 03/25/2024    GFR >= 60 02/11/2013    GLU 101 03/25/2024    CALCIUM  10.4 03/25/2024    ALBUMIN 4.2 03/25/2024    PHOS 3.7 12/17/2023       Lab Results   Component Value Date    ALKPHOS 233 (H) 03/25/2024    BILITOT 0.3 03/25/2024    BILIDIR 0.10 12/31/2022    PROT 7.9 03/25/2024    ALBUMIN 4.2 03/25/2024    ALT 24 03/25/2024    AST 29 03/25/2024       Albumin/Creatinine Ratio (no units)   Date Value   12/17/2023      Comment:     Unable to calculate.       Lab Results   Component Value Date    CHOL 215 (H) 07/31/2023    CHOL 166 08/26/2022    CHOL 207 (H) 05/14/2021     Lab Results   Component Value Date    HDL 28 (L) 07/31/2023    HDL 30 (L) 08/26/2022    HDL 42 05/14/2021     Lab Results   Component Value  Date    LDL  07/31/2023      Comment:      Unable to calculate due to triglyceride greater than 400 mg/dL.    LDL 89.0 07/31/2023    LDL 81 08/26/2022     Lab Results   Component Value Date    VLDL  07/31/2023      Comment:      Unable to calculate due to triglyceride greater than 400 mg/dL.    VLDL 54.8 (H) 08/26/2022    VLDL 64.4 (H) 05/14/2021     Lab Results   Component Value Date    CHOLHDLRATIO 7.7 (H) 07/31/2023    CHOLHDLRATIO 5.5 (H) 08/26/2022    CHOLHDLRATIO 4.9 (H) 05/14/2021     Lab Results   Component Value Date    TRIG 721 (H) 07/31/2023    TRIG 274 (H) 08/26/2022    TRIG 322 (H) 05/14/2021       The ASCVD Risk score (Arnett DK, et al., 2019) failed to calculate for the following reasons:    The 2019 ASCVD risk score is only valid for ages 37 to 35    Note: For patients with SBP <90 or >200, Total Cholesterol <130 or >320, HDL <20 or >100 which are outside of the allowable range, the calculator will use these upper or lower values to calculate the patient???s risk score.

## 2024-04-01 NOTE — Unmapped (Signed)
 Ms. Jenna Mosley,    It was a pleasure to see you today! As we discussed:     Please DECREASE Humulin  to 85 units under the skin every morning.     Please continue Mounjaro  12.5mg  under the skin weekly.     Please continue Farxiga  5mg  by mouth daily.    Please continue metformin  1000mg  by mouth twice daily.     Please continue checking blood sugar with Dexcom. We sent a prescription to the [facility] pharmacy.     We sent a message to Dr. Terral Ferrari to discuss a plan for your diabetes medications while you are on a liquid diet.     Contact the clinic if you are having persistently low blood sugar readings <70 mg/dl or if you are having symptoms of low blood sugar.    Follow up with Dr. Terral Ferrari on 06/07/24    Follow up with clinical pharmacist in August 2025    Elmyra Haggard, PharmD, MPH  PGY1 Pharmacy Resident

## 2024-04-03 DIAGNOSIS — C9210 Chronic myeloid leukemia, BCR/ABL-positive, not having achieved remission: Principal | ICD-10-CM

## 2024-04-04 ENCOUNTER — Other Ambulatory Visit: Admit: 2024-04-04 | Discharge: 2024-04-05 | Payer: Medicaid (Managed Care)

## 2024-04-04 ENCOUNTER — Ambulatory Visit
Admit: 2024-04-04 | Discharge: 2024-04-05 | Payer: Medicaid (Managed Care) | Attending: Student in an Organized Health Care Education/Training Program | Primary: Student in an Organized Health Care Education/Training Program

## 2024-04-04 DIAGNOSIS — C9210 Chronic myeloid leukemia, BCR/ABL-positive, not having achieved remission: Principal | ICD-10-CM

## 2024-04-04 LAB — CBC W/ AUTO DIFF
BASOPHILS ABSOLUTE COUNT: 0.1 10*9/L (ref 0.0–0.1)
BASOPHILS RELATIVE PERCENT: 1.2 %
EOSINOPHILS ABSOLUTE COUNT: 0.1 10*9/L (ref 0.0–0.5)
EOSINOPHILS RELATIVE PERCENT: 1.8 %
HEMATOCRIT: 40.1 % (ref 34.0–44.0)
HEMOGLOBIN: 13.6 g/dL (ref 11.3–14.9)
LYMPHOCYTES ABSOLUTE COUNT: 2.6 10*9/L (ref 1.1–3.6)
LYMPHOCYTES RELATIVE PERCENT: 36.7 %
MEAN CORPUSCULAR HEMOGLOBIN CONC: 33.8 g/dL (ref 32.0–36.0)
MEAN CORPUSCULAR HEMOGLOBIN: 28.6 pg (ref 25.9–32.4)
MEAN CORPUSCULAR VOLUME: 84.5 fL (ref 77.6–95.7)
MEAN PLATELET VOLUME: 8.2 fL (ref 6.8–10.7)
MONOCYTES ABSOLUTE COUNT: 0.3 10*9/L (ref 0.3–0.8)
MONOCYTES RELATIVE PERCENT: 4.7 %
NEUTROPHILS ABSOLUTE COUNT: 4 10*9/L (ref 1.8–7.8)
NEUTROPHILS RELATIVE PERCENT: 55.6 %
PLATELET COUNT: 240 10*9/L (ref 150–450)
RED BLOOD CELL COUNT: 4.75 10*12/L (ref 3.95–5.13)
RED CELL DISTRIBUTION WIDTH: 13.8 % (ref 12.2–15.2)
WBC ADJUSTED: 7.1 10*9/L (ref 3.6–11.2)

## 2024-04-04 LAB — COMPREHENSIVE METABOLIC PANEL
ALBUMIN: 3.9 g/dL (ref 3.4–5.0)
ALKALINE PHOSPHATASE: 186 U/L — ABNORMAL HIGH (ref 46–116)
ALT (SGPT): 14 U/L (ref 10–49)
ANION GAP: 12 mmol/L (ref 5–14)
AST (SGOT): 26 U/L (ref <=34)
BILIRUBIN TOTAL: 0.3 mg/dL (ref 0.3–1.2)
BLOOD UREA NITROGEN: 19 mg/dL (ref 9–23)
BUN / CREAT RATIO: 17
CALCIUM: 10.2 mg/dL (ref 8.7–10.4)
CHLORIDE: 99 mmol/L (ref 98–107)
CO2: 28 mmol/L (ref 20.0–31.0)
CREATININE: 1.1 mg/dL — ABNORMAL HIGH (ref 0.55–1.02)
EGFR CKD-EPI (2021) FEMALE: 68 mL/min/1.73m2 (ref >=60)
GLUCOSE RANDOM: 191 mg/dL — ABNORMAL HIGH (ref 70–179)
POTASSIUM: 3.7 mmol/L (ref 3.4–4.8)
PROTEIN TOTAL: 7.7 g/dL (ref 5.7–8.2)
SODIUM: 139 mmol/L (ref 135–145)

## 2024-04-04 LAB — SLIDE REVIEW

## 2024-04-04 MED ORDER — BOSUTINIB 500 MG TABLET
ORAL_TABLET | 2 refills | 0.00000 days
Start: 2024-04-04 — End: ?

## 2024-04-05 DIAGNOSIS — C9210 Chronic myeloid leukemia, BCR/ABL-positive, not having achieved remission: Principal | ICD-10-CM

## 2024-04-05 NOTE — Unmapped (Signed)
 Per IB from [facility] called and spoke with pt and got her scheduled for Lupron  injections on 6/2 and 9/2. Pt verbally confirmed  appt dates and times

## 2024-04-05 NOTE — Unmapped (Signed)
 I was the supervising physician in the delivery of the service. Nila Nephew, MD

## 2024-04-06 DIAGNOSIS — C9210 Chronic myeloid leukemia, BCR/ABL-positive, not having achieved remission: Principal | ICD-10-CM

## 2024-04-12 DIAGNOSIS — C9210 Chronic myeloid leukemia, BCR/ABL-positive, not having achieved remission: Principal | ICD-10-CM

## 2024-04-12 NOTE — Unmapped (Signed)
 [facility] Refill Coordination Note    Specialty Medication(s) to be Shipped:   Hematology/Oncology: Bosulif     Other medication(s) to be shipped: No additional medications requested for fill at this time     Jenna Mosley, DOB: 1991/07/15  Phone: [phone] (home)       All above HIPAA information was verified with patient.     Was a Nurse, learning disability used for this call? No    Completed refill call assessment today to schedule patient's medication shipment from the [facility]  [phone]).  All relevant notes have been reviewed.     Specialty medication(s) and dose(s) confirmed: Regimen is correct and unchanged.   Changes to medications: Desyre reports no changes at this time.  Changes to insurance: No  New side effects reported not previously addressed with a pharmacist or physician: None reported  Questions for the pharmacist: No    Confirmed patient received a Conservation officer, historic buildings and a Surveyor, mining with first shipment. The patient will receive a drug information handout for each medication shipped and additional FDA Medication Guides as required.       DISEASE/MEDICATION-SPECIFIC INFORMATION        N/A    SPECIALTY MEDICATION ADHERENCE     Medication Adherence    Patient reported X missed doses in the last month: 0  Specialty Medication: Bosulif  500mg   Patient is on additional specialty medications: No  Informant: patient              Were doses missed due to medication being on hold? Yes - 1st part of May - issues with insurance      Bosulif  500mg  : 3 days of medicine on hand       REFERRAL TO PHARMACIST     Referral to the pharmacist: Yes - high priority compliance concerns. Patient has missed more than 3 doses of medication. Refills were scheduled and concern routed (high priority) to pharmacist for evaluation.      SHIPPING     Shipping address confirmed in Epic.     Cost and Payment: Patient has a copay of $4.00. They are aware and have authorized the pharmacy to charge the credit card on file.    Delivery Scheduled: Yes, Expected medication delivery date: 5.29.2025.     Medication will be delivered via Next Day Courier to the prescription address in Epic WAM.    Marcella Serge   [facility]  Specialty Technician

## 2024-04-13 ENCOUNTER — Encounter: Admit: 2024-04-13 | Discharge: 2024-04-13 | Payer: Medicaid (Managed Care)

## 2024-04-13 ENCOUNTER — Inpatient Hospital Stay: Admit: 2024-04-13 | Discharge: 2024-04-13 | Payer: Medicaid (Managed Care)

## 2024-04-13 MED ADMIN — Propofol (DIPRIVAN) injection: INTRAVENOUS | @ 14:00:00 | Stop: 2024-04-13

## 2024-04-13 MED ADMIN — ePHEDrine (PF) 25 mg/5 mL (5 mg/mL) in 0.9% sodium chloride syringe Syrg: INTRAVENOUS | @ 14:00:00 | Stop: 2024-04-13

## 2024-04-13 MED ADMIN — lidocaine (PF) (XYLOCAINE-MPF) 20 mg/mL (2 %) injection: INTRAVENOUS | @ 14:00:00 | Stop: 2024-04-13

## 2024-04-13 MED ADMIN — dexAMETHasone (DECADRON) 4 mg/mL injection: INTRAVENOUS | @ 14:00:00 | Stop: 2024-04-13

## 2024-04-13 MED ADMIN — fentaNYL (PF) (SUBLIMAZE) injection: INTRAVENOUS | @ 14:00:00 | Stop: 2024-04-13

## 2024-04-13 MED ADMIN — succinylcholine (ANECTINE) injection: INTRAVENOUS | @ 14:00:00 | Stop: 2024-04-13

## 2024-04-13 MED ADMIN — lactated Ringers infusion: 10 mL/h | INTRAVENOUS | @ 14:00:00 | Stop: 2024-04-13

## 2024-04-13 MED ADMIN — ondansetron (ZOFRAN) injection: INTRAVENOUS | @ 14:00:00 | Stop: 2024-04-13

## 2024-04-13 MED ADMIN — glycopyrrolate (ROBINUL) injection: INTRAVENOUS | @ 14:00:00 | Stop: 2024-04-13

## 2024-04-13 MED ADMIN — midazolam (VERSED) injection: INTRAVENOUS | @ 14:00:00 | Stop: 2024-04-13

## 2024-04-13 MED FILL — BOSULIF 500 MG TABLET: ORAL | 30 days supply | Qty: 30 | Fill #1

## 2024-04-15 NOTE — Unmapped (Signed)
 Dear Ms. Davids,    Pathology shows hyperplastic polyps. gastric hyperplastic polyp are generally considered benign (non-cancerous) lesions.  You also have mild chronic inactive gastritis which means that you did have some inflammation of the stomach in the past but right now it does not look like you have inflammation in your stomach.  Results will be forwarded to your surgeon.

## 2024-04-18 ENCOUNTER — Ambulatory Visit: Admit: 2024-04-18 | Discharge: 2024-04-19 | Payer: Medicaid (Managed Care)

## 2024-04-18 DIAGNOSIS — C9210 Chronic myeloid leukemia, BCR/ABL-positive, not having achieved remission: Principal | ICD-10-CM

## 2024-04-18 MED ADMIN — leuprolide (LUPRON) injection 11.25 mg: 11.25 mg | INTRAMUSCULAR | @ 17:00:00 | Stop: 2024-04-18

## 2024-04-18 NOTE — Unmapped (Signed)
 Jahne presents to [facility] clinic today for Lupron  injection. Lupron  given as directed per therapy plan. Pt tolerated injection well. Gwendy has no further needs or concerns at this time and is discharged home to self care.

## 2024-04-19 MED ORDER — FARXIGA 10 MG TABLET
ORAL_TABLET | Freq: Every morning | ORAL | 0 refills | 90.00000 days | Status: CP
Start: 2024-04-19 — End: ?

## 2024-04-19 MED FILL — DEXCOM G7 SENSOR DEVICE: ORAL | 90 days supply | Qty: 9 | Fill #0

## 2024-04-19 NOTE — Unmapped (Signed)
 Case submitted to insurance requesting authorization of laparoscopic gastric bypass.  Dr, .Arlena Lacrosse  - Careers adviser

## 2024-04-25 MED ORDER — PREGABALIN 75 MG CAPSULE
ORAL_CAPSULE | ORAL | 1 refills | 0.00000 days | Status: CP
Start: 2024-04-25 — End: ?

## 2024-04-26 ENCOUNTER — Ambulatory Visit: Admit: 2024-04-26 | Discharge: 2024-04-27 | Payer: Medicaid (Managed Care)

## 2024-04-26 DIAGNOSIS — F419 Anxiety disorder, unspecified: Principal | ICD-10-CM

## 2024-04-26 DIAGNOSIS — J984 Other disorders of lung: Principal | ICD-10-CM

## 2024-04-26 DIAGNOSIS — Z01818 Encounter for other preprocedural examination: Principal | ICD-10-CM

## 2024-04-26 DIAGNOSIS — Z6841 Body Mass Index (BMI) 40.0 and over, adult: Principal | ICD-10-CM

## 2024-04-26 DIAGNOSIS — E1140 Type 2 diabetes mellitus with diabetic neuropathy, unspecified: Principal | ICD-10-CM

## 2024-04-26 DIAGNOSIS — I5032 Chronic diastolic (congestive) heart failure: Principal | ICD-10-CM

## 2024-04-26 DIAGNOSIS — Z794 Long term (current) use of insulin: Principal | ICD-10-CM

## 2024-04-26 DIAGNOSIS — I10 Essential (primary) hypertension: Principal | ICD-10-CM

## 2024-04-26 DIAGNOSIS — I272 Pulmonary hypertension, unspecified: Principal | ICD-10-CM

## 2024-04-26 NOTE — Unmapped (Addendum)
 The type of anesthesia reviewed for your surgery was general anesthesia . The final plan will be discussed the morning of surgery by your anesthesia care team.  .    Please follow the eating and drinking instructions noted in the pamphlet given in the  Pre-Procedure Services Clinic.  Consider drinking 8 to 12 ounces of a low sugar sports drink (gatorade, powerade) the morning of your procedure. You may continue to drink clear liquids until 2 hours prior to your given arrival time for your procedure.. Do not take any regular insulin  the day of surgery.   Please take only the medications listed in the Pre-Procedure Services Clinic pamphlet and or highlighted on the visit summary      Per Anesthesia's guidelines:    Please take the following medications the morning of your procedure with water:        Pregabalin   Bosutinib  Carvedilol   Zofran  if need  Pantoprazole   Geodon   Lamictal   Aygestin       Vitamins- hold 7 days prior to sx    Farxiga - hold 3 days prior to sx last dose 7/13    Since you are taking a GLP-1 medication, there are additional instructions for you before your surgery. These medications can slow how quickly food leaves your stomach, which can create an increased risk during surgery.     To help keep you safe, we ask that you start drinking only clear liquids at 0900 AM the day before your surgery.  No food, milk, Jello, broth or juices with pulp after this time. You may have water, apple or white grape juice, any kind of soda, sports drinks (avoid red and purple drinks) and black coffee (no milk or cream). You may have these clear liquids until 2 hours prior to your arrival time. Nothing else by mouth after this time.    If you were given a clear Ensure to drink, please drink this 2 hours prior to your arrival time.     Continue your Tirzepatide  (Mounjaro , Zepbound) as prescribed unless told otherwise by the provider prescribing this medication.     If you have any questions, please call Pre-Procedure Services at [phone].    Thank you for helping us  keep you safe!

## 2024-05-10 DIAGNOSIS — C9210 Chronic myeloid leukemia, BCR/ABL-positive, not having achieved remission: Principal | ICD-10-CM

## 2024-05-10 NOTE — Unmapped (Signed)
 [facility] Refill Coordination Note    Specialty Medication(s) to be Shipped:   Hematology/Oncology: Bosulif     Other medication(s) to be shipped: No additional medications requested for fill at this time     Jenna Mosley, DOB: [birth year]  Phone: [phone] (home)       All above HIPAA information was verified with patient.     Was a Nurse, learning disability used for this call? No    Completed refill call assessment today to schedule patient's medication shipment from the [facility]  [phone]).  All relevant notes have been reviewed.     Specialty medication(s) and dose(s) confirmed: Regimen is correct and unchanged.   Changes to medications: Nakisha reports starting the following medications: Keflex   Changes to insurance: No  New side effects reported not previously addressed with a pharmacist or physician: None reported  Questions for the pharmacist: No    Confirmed patient received a Conservation officer, historic buildings and a Surveyor, mining with first shipment. The patient will receive a drug information handout for each medication shipped and additional FDA Medication Guides as required.       DISEASE/MEDICATION-SPECIFIC INFORMATION        N/A    SPECIALTY MEDICATION ADHERENCE     Medication Adherence    Patient reported X missed doses in the last month: 0  Specialty Medication: Bosulif  500mg   Patient is on additional specialty medications: No  Informant: patient     Were doses missed due to medication being on hold? No    Bosulif  500mg  : 4 days of medicine on hand       REFERRAL TO PHARMACIST     Referral to the pharmacist: Not needed      [facility]     Shipping address confirmed in Epic.     Cost and Payment: Patient has a copay of $4.00. They are aware and have authorized the pharmacy to charge the credit card on file.    Delivery Scheduled: Yes, Expected medication delivery date: 05/12/2024.     Medication will be delivered via Next Day Courier to the prescription address in Epic [place].    Javani Spratt M Santer Torres   [facility]  Specialty Technician

## 2024-05-11 MED FILL — BOSULIF 500 MG TABLET: ORAL | 30 days supply | Qty: 30 | Fill #2

## 2024-05-17 ENCOUNTER — Encounter: Admit: 2024-05-17 | Discharge: 2024-05-17 | Payer: Medicaid (Managed Care)

## 2024-05-17 LAB — URINALYSIS WITH MICROSCOPY WITH CULTURE REFLEX PERFORMABLE
BACTERIA: NONE SEEN /HPF
BILIRUBIN UA: NEGATIVE
BLOOD UA: NEGATIVE
GLUCOSE UA: 300 — AB
KETONES UA: NEGATIVE
NITRITE UA: NEGATIVE
PH UA: 6 (ref 5.0–9.0)
PROTEIN UA: NEGATIVE
RBC UA: 1 /HPF (ref <=4)
SPECIFIC GRAVITY UA: 1.01 (ref 1.003–1.030)
SQUAMOUS EPITHELIAL: <1 /HPF (ref 0–5)
TRANSITIONAL EPITHELIAL: <1 /HPF (ref 0–2)
UROBILINOGEN UA: <2
WBC UA: 3 /HPF (ref 0–5)

## 2024-05-17 NOTE — Unmapped (Signed)
 Thanks for choosing [facility] at [facility]  for your medical care!    If you have any questions about your visit today, please call us at [phone].      For medication refills, please have your pharmacist send an electronic refill request    If you need care after 5:00 pm during the week or on the weekend:  Call [facility] Nurse Connect at [phone] to speak with a registered nurse about a medical concern or question, or...    Go to [facility] walk-in clinic [address], [place] [phone] -- Open 7 days a week from 9:00AM - 8:00PM        We will always try to notify you of the results from laboratory tests within ten days of the study.  If you do not hear from [place] by phone, letter, or electronic message, please call the office immediately for further information.         Before your next visit:    --  Keep a list of questions that you would like to discuss when you return.

## 2024-05-20 DIAGNOSIS — C9210 Chronic myeloid leukemia, BCR/ABL-positive, not having achieved remission: Principal | ICD-10-CM

## 2024-05-20 NOTE — Unmapped (Signed)
 Urinary frequency persists despite recent treatment with Keflex  for a urinary tract infection. Improvement in burning sensation noted, but ongoing urgency persists. No fever or back pain reported. History of UTI leading to sepsis necessitates cautious approach. Vaginal pain noted but resolved spontaneously. Differential includes recurrent UTI or residual inflammation.  - Send urine sample for urinalysis and culture to determine presence of infection.  - Review culture results to guide further antibiotic treatment if necessary.  - Monitor for any signs of recurrent infection, especially given history of sepsis.

## 2024-05-20 NOTE — Unmapped (Signed)
 Patient ID: Jenna Mosley is a 33 y.o. female who presents for   1. Urgency of urination    2. Type 2 diabetes mellitus with diabetic neuropathy, with long-term current use of insulin        3. Acute pain of left shoulder       Informant: Patient came to appointment alone.    Assessment/Plan:      Assessment & Plan  Urgency of urination  Urinary frequency persists despite recent treatment with Keflex  for a urinary tract infection. Improvement in burning sensation noted, but ongoing urgency persists. No fever or back pain reported. History of UTI leading to sepsis necessitates cautious approach. Vaginal pain noted but resolved spontaneously. Differential includes recurrent UTI or residual inflammation.  - Send urine sample for urinalysis and culture to determine presence of infection.  - Review culture results to guide further antibiotic treatment if necessary.  - Monitor for any signs of recurrent infection, especially given history of sepsis.  Type 2 diabetes mellitus with diabetic neuropathy, with long-term current use of insulin     She is undergoing gastric bypass surgery to aid in diabetes management and has experienced significant weight loss with Mounjaro  and Farxiga . Current HbA1c is 5.7, indicating good control.  - Continue current diabetes management plan.  - Monitor blood glucose levels, especially post-surgery.  - Provide printout of medications for dental visit.    Acute pain of left shoulder  Acute left shoulder pain following overuse from carrying a heavy bag. No acute injury reported. Limited range of motion noted, with concern for potential frozen shoulder if not addressed.  - Instruct on 'walking up the wall' exercise to improve shoulder mobility.  - Advise against allowing shoulder to become immobile to prevent frozen shoulder.    Preventive services addressed today  Preventive services are currently up to date    Return if symptoms worsen or fail to improve.       Subjective:      History of Present Illness  Jenna Mosley is a 33 year old female with diabetes and hypertension who presents with concerns for a urinary tract infection.    Approximately one to two weeks ago, she experienced dysuria, prompting a visit to an urgent care clinic. A urinalysis and culture confirmed a urinary tract infection, and she was prescribed Keflex . She found the three times daily dosing challenging but adhered to it as best as she could. The dysuria improved, but she continues to experience some urgency. Recently, she had an hour of vaginal pain, which has since resolved. She is particularly cautious about her urinary symptoms due to a previous episode of sepsis from an asymptomatic UTI. No fever or back pain beyond her normal symptoms. She is scheduled for gastric bypass surgery on the 16th and is keen to ensure no infection is present before the procedure.    She reports left shoulder pain following carrying a heavy bag of groceries with one arm, describing it as 'the worst.' The pain makes it difficult to raise her arm, although she denies any acute injury.    Her medication history includes an allergy  to Augmentin  and resistance to Cipro, with Bactrim  being less effective. She is currently taking Farxiga  (dapagliflozin ) for diabetes management, which has significantly improved her condition, although it has not reversed her diabetes.           Objective:      Vital Signs  BP 106/60 (BP Site: R Arm, BP Position: Sitting, BP Cuff Size: Large)  -  Pulse 69  - Wt (!) 144.9 kg (319 lb 6.4 oz)  - SpO2 99%  - BMI 43.32 kg/m??       Exam  General: Young woman sitting comfortably  EYES: Anicteric sclerae.  RESP: Relaxed respiratory effort.   MSK: Left shoulder with limited range of motion above 90 degrees. No midline tenderness on back palpation.  SKIN: Appropriately warm and moist.  NEURO: Stable gait and coordination.

## 2024-05-20 NOTE — Unmapped (Signed)
 She is undergoing gastric bypass surgery to aid in diabetes management and has experienced significant weight loss with Mounjaro  and Farxiga . Current HbA1c is 5.7, indicating good control.  - Continue current diabetes management plan.  - Monitor blood glucose levels, especially post-surgery.  - Provide printout of medications for dental visit.

## 2024-05-20 NOTE — Unmapped (Signed)
 Acute left shoulder pain following overuse from carrying a heavy bag. No acute injury reported. Limited range of motion noted, with concern for potential frozen shoulder if not addressed.  - Instruct on 'walking up the wall' exercise to improve shoulder mobility.  - Advise against allowing shoulder to become immobile to prevent frozen shoulder.

## 2024-05-23 DIAGNOSIS — C9210 Chronic myeloid leukemia, BCR/ABL-positive, not having achieved remission: Principal | ICD-10-CM

## 2024-05-23 DIAGNOSIS — I5032 Chronic diastolic (congestive) heart failure: Principal | ICD-10-CM

## 2024-05-23 MED ORDER — CARVEDILOL 6.25 MG TABLET
ORAL_TABLET | Freq: Two times a day (BID) | ORAL | 6 refills | 30.00000 days | Status: CP
Start: 2024-05-23 — End: ?

## 2024-05-24 ENCOUNTER — Ambulatory Visit
Admit: 2024-05-24 | Discharge: 2024-05-25 | Payer: Medicaid (Managed Care) | Attending: Student in an Organized Health Care Education/Training Program | Primary: Student in an Organized Health Care Education/Training Program

## 2024-05-24 DIAGNOSIS — C9210 Chronic myeloid leukemia, BCR/ABL-positive, not having achieved remission: Principal | ICD-10-CM

## 2024-05-24 DIAGNOSIS — R197 Diarrhea, unspecified: Principal | ICD-10-CM

## 2024-05-24 MED ORDER — MOUNJARO 12.5 MG/0.5 ML SUBCUTANEOUS PEN INJECTOR
SUBCUTANEOUS | 0 refills | 0.00 days | Status: CP
Start: 2024-05-24 — End: 2024-06-15
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
## 2024-05-24 NOTE — Unmapped (Signed)
 Your surgery has been scheduled 06-01-2024  You will receive a call the day before your surgery between the hours of 2 and 6 to advise you of your time to arrive to the surgical hospital.      Please start the liver reduction diet 14 days prior to your surgery date.   On the morning of your surgery, only take the medicines that the Pre-Arrival associate told you were OK to take.  Take them with a sip of water.    One (1) hour prior to your scheduled arrive time, drink the Ensure-Clear Pre Surgery Drink.      The night before your surgery and the morning of please take a shower with any anti-bacterial soap.    On the morning of your surgery, only take the medicines that the Pre-Arrival associate told you were OK to take.  Take them with a sip of water.    The night before your surgery and the morning of please take a shower with any anti-bacterial soap.      If you have any questions regarding your visit today please feel free to contact me at the number listed below.     Honora Lutes, RN, Bariatric Coordinator  Surgical RN Coordinator for Dr. Peyton Brash   [email or web address]  Phone:  [phone]  Fax:       [phone]

## 2024-05-26 ENCOUNTER — Ambulatory Visit: Admit: 2024-05-26 | Discharge: 2024-05-27 | Payer: Medicaid (Managed Care)

## 2024-05-26 ENCOUNTER — Encounter
Admit: 2024-05-26 | Discharge: 2024-05-27 | Payer: Medicaid (Managed Care) | Attending: Student in an Organized Health Care Education/Training Program | Primary: Student in an Organized Health Care Education/Training Program

## 2024-05-26 DIAGNOSIS — C9210 Chronic myeloid leukemia, BCR/ABL-positive, not having achieved remission: Principal | ICD-10-CM

## 2024-05-27 DIAGNOSIS — C9210 Chronic myeloid leukemia, BCR/ABL-positive, not having achieved remission: Principal | ICD-10-CM

## 2024-05-27 NOTE — Unmapped (Signed)
 Phone call to patient regarding use of GLP-1 medication.    Patient confirmed that they are actively taking Tirzepatide (Mounjaro, Zepbound) or have taken a GLP-1 medication in the last 30 days.    Patient advised that beginning at 9 am the day before surgery they should begin a clear liquid diet consisting of water, apple or white grape juice, sodas, sports drinks (except red/purple), or black coffee.  Patient aware that they may have these liquids until 2 hours prior to the arrival time.    Educated to:   avoid broths, jello, and dairy  clear liquids are encouraged until 2 hours prior to arrival time  continue taking their GLP-1 medication as prescribed    Patient was educated that this is to reduce the chance of a preventable complication and increase safety surrounding perioperative use of these medications.    All questions were answered and patient verbalized understanding.

## 2024-06-01 ENCOUNTER — Encounter
Admit: 2024-06-01 | Discharge: 2024-06-04 | Disposition: A | Discharge status: Home / Self Care | Payer: Medicaid (Managed Care) | Attending: Anesthesiology | Admitting: Student in an Organized Health Care Education/Training Program | Primary: Anesthesiology

## 2024-06-01 ENCOUNTER — Inpatient Hospital Stay
Admit: 2024-06-01 | Discharge: 2024-06-04 | Disposition: A | Discharge status: Home / Self Care | Payer: Medicaid (Managed Care) | Admitting: Student in an Organized Health Care Education/Training Program

## 2024-06-01 MED ADMIN — acetaminophen (TYLENOL) tablet 975 mg: 975 mg | ORAL | @ 16:00:00 | Stop: 2024-06-01

## 2024-06-01 MED ADMIN — vasopressin (VASOSTRICT) injection: INTRAVENOUS | @ 19:00:00 | Stop: 2024-06-01

## 2024-06-01 MED ADMIN — ROCuronium (ZEMURON) injection: INTRAVENOUS | @ 18:00:00 | Stop: 2024-06-01

## 2024-06-01 MED ADMIN — fentaNYL (PF) (SUBLIMAZE) injection: INTRAVENOUS | @ 18:00:00 | Stop: 2024-06-01

## 2024-06-01 MED ADMIN — lactated Ringers infusion: 10 mL/h | INTRAVENOUS | @ 20:00:00

## 2024-06-01 MED ADMIN — prochlorperazine (COMPAZINE) injection 5 mg: 5 mg | INTRAVENOUS | @ 22:00:00 | Stop: 2024-06-01

## 2024-06-01 MED ADMIN — aprepitant (EMEND) capsule 40 mg: 40 mg | ORAL | @ 16:00:00 | Stop: 2024-06-01

## 2024-06-01 MED ADMIN — ePHEDrine (PF) 25 mg/5 mL (5 mg/mL) in 0.9% sodium chloride syringe: INTRAVENOUS | @ 21:00:00 | Stop: 2024-06-01

## 2024-06-01 MED ADMIN — bupivacaine-EPINEPHrine (PF) (MARCAINE-PF w/EPI) 0.25 %-1:200,000 injection (PF): @ 21:00:00 | Stop: 2024-06-01

## 2024-06-01 MED ADMIN — sodium chloride irrigation (NS) 0.9 % irrigation solution: @ 20:00:00 | Stop: 2024-06-01

## 2024-06-01 MED ADMIN — VECuronium (NORCURON) injection: INTRAVENOUS | @ 20:00:00 | Stop: 2024-06-01

## 2024-06-01 MED ADMIN — succinylcholine (ANECTINE) injection: INTRAVENOUS | @ 18:00:00 | Stop: 2024-06-01

## 2024-06-01 MED ADMIN — ondansetron (ZOFRAN) injection: INTRAVENOUS | @ 21:00:00 | Stop: 2024-06-01

## 2024-06-01 MED ADMIN — dexmedeTOMIDine (Precedex) 400 mcg in sodium chloride 0.9% 100 ml (4 mcg/mL) infusion PMB: INTRAVENOUS | @ 21:00:00 | Stop: 2024-06-01

## 2024-06-01 MED ADMIN — sugammadex (BRIDION) injection: INTRAVENOUS | @ 21:00:00 | Stop: 2024-06-01

## 2024-06-01 MED ADMIN — fentaNYL (PF) (SUBLIMAZE) injection 25 mcg: 25 ug | INTRAVENOUS | @ 22:00:00 | Stop: 2024-06-01

## 2024-06-01 MED ADMIN — ceFAZolin (ANCEF) 3 g in sodium chloride 0.9 % (NS) 100 mL IVPB-MBP: 3 g | INTRAVENOUS | @ 18:00:00 | Stop: 2024-06-01

## 2024-06-01 MED ADMIN — VECuronium (NORCURON) injection: INTRAVENOUS | @ 21:00:00 | Stop: 2024-06-01

## 2024-06-01 MED ADMIN — ROCuronium (ZEMURON) injection: INTRAVENOUS | @ 19:00:00 | Stop: 2024-06-01

## 2024-06-01 MED ADMIN — haloperidol LACTATE (HALDOL) injection: INTRAVENOUS | @ 19:00:00 | Stop: 2024-06-01

## 2024-06-01 MED ADMIN — Propofol (DIPRIVAN) injection: INTRAVENOUS | @ 18:00:00 | Stop: 2024-06-01

## 2024-06-01 MED ADMIN — scopolamine (TRANSDERM-SCOP) 1 mg over 3 days topical patch 1 mg: 1 | TOPICAL | @ 16:00:00 | Stop: 2024-06-01

## 2024-06-01 MED ADMIN — pantoprazole (Protonix) injection 40 mg: 40 mg | INTRAVENOUS | @ 22:00:00

## 2024-06-01 MED ADMIN — heparin (porcine) 5,000 unit/mL injection 5,000 Units: 5000 [IU] | SUBCUTANEOUS | @ 16:00:00 | Stop: 2024-06-01

## 2024-06-01 MED ADMIN — acetaminophen (OFIRMEV) 10 mg/mL injection 1,000 mg: 1000 mg | INTRAVENOUS | @ 22:00:00 | Stop: 2024-06-02

## 2024-06-01 MED ADMIN — lidocaine (PF) (XYLOCAINE-MPF) 20 mg/mL (2 %) injection: INTRAVENOUS | @ 18:00:00 | Stop: 2024-06-01

## 2024-06-01 MED ADMIN — fentaNYL (PF) (SUBLIMAZE) injection: INTRAVENOUS | @ 21:00:00 | Stop: 2024-06-01

## 2024-06-01 MED ADMIN — methocarbamol (ROBAXIN) 1,000 mg in sodium chloride (NS) 0.9 % 50 mL IVPB: 1000 mg | INTRAVENOUS | @ 23:00:00

## 2024-06-01 MED ADMIN — NORepinephrine 8 mg in dextrose 5 % 250 mL (32 mcg/mL) infusion PMB: INTRAVENOUS | @ 18:00:00 | Stop: 2024-06-01

## 2024-06-01 MED ADMIN — midazolam (VERSED) injection: INTRAVENOUS | @ 17:00:00 | Stop: 2024-06-01

## 2024-06-01 MED ADMIN — lactated Ringers infusion: 10 mL/h | INTRAVENOUS | @ 17:00:00

## 2024-06-01 MED ADMIN — metroNIDAZOLE (FLAGYL) IVPB 500 mg: 500 mg | INTRAVENOUS | @ 18:00:00 | Stop: 2024-06-01

## 2024-06-01 MED ADMIN — celecoxib (CeleBREX) capsule 200 mg: 200 mg | ORAL | @ 16:00:00 | Stop: 2024-06-01

## 2024-06-01 MED ADMIN — lactated Ringers infusion: 125 mL/h | INTRAVENOUS | @ 23:00:00

## 2024-06-01 MED ADMIN — vasopressin (VASOSTRICT) injection: INTRAVENOUS | @ 20:00:00 | Stop: 2024-06-01

## 2024-06-01 MED ADMIN — fentaNYL (PF) (SUBLIMAZE) injection: INTRAVENOUS | @ 17:00:00 | Stop: 2024-06-01

## 2024-06-01 MED ADMIN — ROCuronium (ZEMURON) injection: INTRAVENOUS | @ 20:00:00 | Stop: 2024-06-01

## 2024-06-01 MED ADMIN — oxyCODONE (ROXICODONE) 5 mg/5 mL solution 5 mg: 5 mg | ORAL | @ 22:00:00 | Stop: 2024-06-01

## 2024-06-01 MED ADMIN — dexmedeTOMIDine (Precedex) 400 mcg in sodium chloride 0.9% 100 ml (4 mcg/mL) infusion PMB: INTRAVENOUS | @ 19:00:00 | Stop: 2024-06-01

## 2024-06-01 MED ADMIN — fentaNYL (PF) (SUBLIMAZE) injection: INTRAVENOUS | @ 20:00:00 | Stop: 2024-06-01

## 2024-06-01 MED ADMIN — fentaNYL (PF) (SUBLIMAZE) injection 25 mcg: 25 ug | INTRAVENOUS | @ 23:00:00 | Stop: 2024-06-01

## 2024-06-01 NOTE — Unmapped (Signed)
 Bariatric Surgery Operative Report    Pt. Name:   Jenna Mosley    Med. Record Number:    [id number]    Date of admission: 06/01/2024    Surgeon: [name] MD, MPH     Assistant: Jolaine Clause, MD (resident, assisting)     Anesthesia: Anesthesiologist: Judy Castellani, MD  Anesthesia Provider: Clarinda Silvano CROME, CRNA; McCullough-Roach, Bettey, MD; Hetty Clarene LABOR, MD    Date of Surgery: 06/01/2024    Pre-op Diagnosis: Morbid obesity unresponsive to medical management         Post-op Diagnosis: same      Procedure:   LAPAROSCOPY, SURG, GASTRIC RESTRICT PROC; W/GASTRIC BYPASS & ROUX-EN-Y GASTROENTEROS(ROUX LIMB 150 CM/LESS)  Bilateral - TRANSVERSUS ABDOMINIS PLANE (TAP) BLOCK (ABDOMINAL PLANE BLOCK, RECTUS SHEATH BLOCK) BILATERAL; BY INJECTIONS (INCLUDES IMAGING GUIDANCE, WHEN PERFORMED)  Midline - UGI ENDO, INCLUDE ESOPHAGUS, STOMACH, & DUODENUM &/OR JEJUNUM; DX W/WO COLLECTION SPECIMN, BY BRUSH OR WASH  LAPAROSCOPIC BIOPSY OF LIVER, WEDGE     Estimated Blood Loss: 20 mL     Drains:  None      Complications: none apparent at completion of the procedure     Indications: Jenna Mosley is a very pleasant 33yo  female with PMH of morbid obesity, [name] (HgbA1C 5.7), GERD, NASH, HTN, BPAD, [name], pulmonary hypertension, and HFpEF,  who has done very well within our weight loss program thus far.  She is highly motivated and interested in pursuing weight loss surgery, and meets criteria for benefiting from bariatric surgery given her BMI of Body mass index is 44.19 kg/m??..  in clinic today.   We reviewed her PMH and PSH, as well as the options available to her (laparoscopic vs robotic sleeve gastrectomy or laparoscopic vs robotic gastric bypass).  She has had a PSG with resolution of her prior [name] with her weight loss.  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  She has since undergone repeat endoscopic evaluation with biopsies demonstrating ongoing hyperplastic polyp. She is aware that there will be an ongoing risk of possible malignancy or polyp formation in the gastric remnant, which will not be easily evaluated or detected following gastric bypass. She is also aware that gastric bypass may impact the absorption of her bosutinib, compromising control of her [name].  She has been evaluate by Cardiology who consider her optimized for elective surgery and of a low risk. Her oncologist is aware of the plans for gastric bypass and has agreed to holding her bosutinib 1 week prior to surgery and restarting it 2 weeks after surgery.   She is following closely with her therapist and psychiatrist and has had her medications converted to liquid or crushable forms as able. She will continue her Geodon  unchanged post-op. After engaging in a shared decision making process, in which we balanced the options available to her, the relative benefits of weight loss and comorbidity remission, the specifics risks of the possible procedures, and alternatives available to her(such as medical weight loss and behavioral weight loss options), Jenna Mosley would like to move forward with laparoscopic versus robotic Roux-en-Y gastric bypass.  The planned procedure, risks, benefits, and alternatives were reviewed, her questions were answered, and consent was obtained.      Findings:   Normal gastric anatomy.   Hepatosplenomegaly without overt variceal changes.  No visible lesions on the surface of the liver.  Gastric bypass performed with RY limb of 100 cm and BP limb of 125 cm.  Negative endoscopic leak test.  No evidence of hiatal hernia     Description of Procedure:     After informed consent was obtained, the patient was brought to the operating room and placed supine on the operating table.  Sequential compression devices were placed on the lower extremities for venous thromboembolism prophylaxis.  General anesthesia was administered. The patient had received intravenous antibiotics.  The patient was positioned supine with arms abducted. All appropriate pressure points were padded.  Patient identification was verified and surgical timeout was held confirming the patient's identity, the planned procedure, anticipated intraoperative and postoperative needs, and anticipated post-operative destination. The abdomen was prepped and draped in standard sterile fashion.      After it was was confirmed that an OGT was in place and to suction, a Veress needle was introduced at Palmer's point in the LUQ and the abdomen insufflated to 15 mm Hg.     A 5 mm visiport was introduced, using the 5 mm 0 degree scope within the trocar to allow direct visualization of the trocar entering the abdomen.  This 5mm trocar was placed approximately 18 cm from the xyphoid process, to the left of the midline. A  5-mm 30-degree angled video laparoscope was inserted, and the abdomen was explored. There was no evidence of injury from Veress needle placement of abdominal entry. One 12-mm port was placed in the right mid abdomen, two 5mm ports were placed in the subcostal right upper quadrant , and a single 5mm port was placed in the L mid abdomen.     The omentum was reflected superiorly and divided using the harmonic.  The transverse colon was reflected superiorly, exposing the ligament of Treitz.  The proximal biliary limb was measured 125 cm distally and divided using a 60mm tan Signia vascular staple load. The Roux limb was measured 100 cm distally.  A stapled side-to-side functional end-to-side jejunojejunostomy was created using a single firing of the 60mm Tan Signia staple load.  The common enterotomy was closed using a running 2-0 absorbable Vloc suture.  The jejujejunostomy mesenteric defect was closed using a running 0 non-absorbable Vloc suture. The Roux limb was positioned in the upper abdomen and the patient was positioned in steep reverse Trendelenburg.     The articulating liver retractor was introduced through the most lateral RUQ port site and used to elevate the left lateral section of the liver, exposing the region of the gastroesophageal junction.  The gastroesophageal fat pad was taken down off the left crus bluntly.    No hiatal hernia was identified.      The third penetrating vein of the lesser curve was identified, and window was created in the lesser curve to enter the posterior gastric space, sparing the vagus nerve. A gastric pouch was created with three firings of the Signia purple 60 mm reinforced staple loads and 1 firing of a 45mm tan staple load, aiming 1 cm lateral to the angle of His.      The Roux limb was brought towards the pouch and enterotomies were created and a side to side functional end to end gastrojejunal anastomosis measuring 3 cm was created using a single firing of the 45mm Tan Signia staple load. 2- Vicryl sutures were used to reinforce the lateral aspect of the GJ anastomosis. The common enterotomy was closed using a running  2-0 absorbable V lock imbricating the mucosa in 2 layers. The Roux limb was occluded. Upper endoscopy was performed, demonstrating a 4.5cm gastric pouch with a 3cm anastomosis, and without evidence of a leak on insufflation.  The Roux limb was insufflated all the way to the jejunojejunostomy, were the JJ was noted to be widely patent, with insufflation of both BP limb and the common channel, and the JJ was without evidence of leak during this leak test. Two 2-0 vicryl anti-obstruction stitches were placed between the Roux limb and the distal end of the BP limb to minimize kinking at this location.     Peterson's defect were closed using running nonabsorbable 0 Vloc.    As discussed with the patient given concern for MASH and LFT elevation as an outpatient, a 1cm x 0.5cm wedge resection of the left lateral lobe of the liver was sharply resected using the laparoscopic shears.  The resulting liver bed was cauterized to obtain hemostasis. This specimen was passed off of the field as a specimen labeled 'liver biopsy' for permanent pathologic evaluation.     The operative field was examined. There was adequate hemostasis after decreasing pneumoperitoneum to 8 mm Hg. The fascia of the 12mm port site was closed using 0-vicryl ties and the Ranfac suture passer.  A bilateral transversalis abdominis and rectus sheath block was performed using 60cc of 0.25% bupivacaine  with epinephrine  under direct visualization. The abdomen was desufflated and the trocars were removed. The incisions were irrigated with saline solution. Counts were reported complete. All skin incisions were reapproximated using 4-0 monocryl and skin glue. The patient tolerated the procedure well and she was extubated and transferred to the recovery room in stable condition.      Pursuant to [employer], I certify that I was present and scrubbed for the entirety of the case.     [name], MD, MPH  June 01, 2024 5:31 PM
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
## 2024-06-02 LAB — BASIC METABOLIC PANEL
ANION GAP: 14 mmol/L (ref 5–14)
BLOOD UREA NITROGEN: 14 mg/dL (ref 9–23)
BUN / CREAT RATIO: 17
CALCIUM: 8.9 mg/dL (ref 8.7–10.4)
CHLORIDE: 98 mmol/L (ref 98–107)
CO2: 27 mmol/L (ref 20.0–31.0)
CREATININE: 0.82 mg/dL (ref 0.55–1.02)
EGFR CKD-EPI (2021) FEMALE: >90 mL/min/1.73m2 (ref >=60)
GLUCOSE RANDOM: 170 mg/dL (ref 70–179)
POTASSIUM: 3.6 mmol/L (ref 3.4–4.8)
SODIUM: 139 mmol/L (ref 135–145)

## 2024-06-02 LAB — HIGH SENSITIVITY TROPONIN I - SINGLE: HIGH SENSITIVITY TROPONIN I: <3 ng/L (ref <=34)

## 2024-06-02 LAB — CBC
HEMATOCRIT: 36.8 % (ref 34.0–44.0)
HEMOGLOBIN: 12.8 g/dL (ref 11.3–14.9)
MEAN CORPUSCULAR HEMOGLOBIN CONC: 34.7 g/dL (ref 32.0–36.0)
MEAN CORPUSCULAR HEMOGLOBIN: 28.9 pg (ref 25.9–32.4)
MEAN CORPUSCULAR VOLUME: 83.3 fL (ref 77.6–95.7)
MEAN PLATELET VOLUME: 9.3 fL (ref 6.8–10.7)
PLATELET COUNT: 174 10*9/L (ref 150–450)
RED BLOOD CELL COUNT: 4.42 10*12/L (ref 3.95–5.13)
RED CELL DISTRIBUTION WIDTH: 13.7 % (ref 12.2–15.2)
WBC ADJUSTED: 10.6 10*9/L (ref 3.6–11.2)

## 2024-06-02 LAB — PHOSPHORUS: PHOSPHORUS: 3.8 mg/dL (ref 2.4–5.1)

## 2024-06-02 LAB — MAGNESIUM: MAGNESIUM: 1.5 mg/dL — ABNORMAL LOW (ref 1.6–2.6)

## 2024-06-02 MED ADMIN — ceFAZolin (ANCEF) IVPB 2 g in 50 ml dextrose (premix): 2 g | INTRAVENOUS | @ 09:00:00 | Stop: 2024-06-02

## 2024-06-02 MED ADMIN — ondansetron (ZOFRAN) injection 4 mg: 4 mg | INTRAVENOUS | @ 03:00:00

## 2024-06-02 MED ADMIN — oxyCODONE (ROXICODONE) 5 mg/5 mL solution 5 mg: 5 mg | ORAL | @ 14:00:00 | Stop: 2024-06-15

## 2024-06-02 MED ADMIN — acetaminophen (OFIRMEV) 10 mg/mL injection 1,000 mg: 1000 mg | INTRAVENOUS | @ 04:00:00 | Stop: 2024-06-02

## 2024-06-02 MED ADMIN — pantoprazole (Protonix) EC tablet 40 mg: 40 mg | ORAL | @ 12:00:00

## 2024-06-02 MED ADMIN — methocarbamol (ROBAXIN) 1,000 mg in sodium chloride (NS) 0.9 % 50 mL IVPB: 1000 mg | INTRAVENOUS | @ 14:00:00

## 2024-06-02 MED ADMIN — methocarbamol (ROBAXIN) 1,000 mg in sodium chloride (NS) 0.9 % 50 mL IVPB: 1000 mg | INTRAVENOUS | @ 06:00:00

## 2024-06-02 MED ADMIN — oxyCODONE (ROXICODONE) 5 mg/5 mL solution 5 mg: 5 mg | ORAL | @ 06:00:00 | Stop: 2024-06-15

## 2024-06-02 MED ADMIN — carvedilol (COREG) tablet 6.25 mg: 6.25 mg | ORAL | @ 14:00:00

## 2024-06-02 MED ADMIN — gabapentin (NEURONTIN) oral solution: 300 mg | ORAL | @ 09:00:00

## 2024-06-02 MED ADMIN — ondansetron (ZOFRAN) injection 4 mg: 4 mg | INTRAVENOUS | @ 08:00:00

## 2024-06-02 MED ADMIN — metroNIDAZOLE (FLAGYL) IVPB 500 mg: 500 mg | INTRAVENOUS | @ 03:00:00 | Stop: 2024-06-02

## 2024-06-02 MED ADMIN — acetaminophen (OFIRMEV) 10 mg/mL injection 1,000 mg: 1000 mg | INTRAVENOUS | @ 09:00:00 | Stop: 2024-06-02

## 2024-06-02 MED ADMIN — cetirizine (ZYRTEC) tablet 10 mg: 10 mg | ORAL | @ 14:00:00

## 2024-06-02 MED ADMIN — simethicone (MYLICON) oral drops: 40 mg | ORAL | @ 04:00:00

## 2024-06-02 MED ADMIN — ziprasidone (GEODON) capsule 80 mg: 80 mg | ORAL | @ 21:00:00

## 2024-06-02 MED ADMIN — simethicone (MYLICON) oral drops: 40 mg | ORAL | @ 11:00:00 | Stop: 2024-06-02

## 2024-06-02 MED ADMIN — oxyCODONE (ROXICODONE) 5 mg/5 mL solution 5 mg: 5 mg | ORAL | @ 21:00:00 | Stop: 2024-06-15

## 2024-06-02 MED ADMIN — acetaminophen (OFIRMEV) 10 mg/mL injection 1,000 mg: 1000 mg | INTRAVENOUS | @ 17:00:00 | Stop: 2024-06-02

## 2024-06-02 MED ADMIN — gabapentin (NEURONTIN) oral solution: 300 mg | ORAL | @ 19:00:00

## 2024-06-02 MED ADMIN — docusate (COLACE) oral liquid: 50 mg | ORAL | @ 03:00:00

## 2024-06-02 MED ADMIN — simethicone (MYLICON) oral drops: 40 mg | ORAL | @ 17:00:00

## 2024-06-02 MED ADMIN — ondansetron (ZOFRAN) injection 4 mg: 4 mg | INTRAVENOUS | @ 19:00:00

## 2024-06-02 MED ADMIN — fluticasone propionate (FLONASE) 50 mcg/actuation nasal spray 2 spray: 2 | NASAL | @ 14:00:00

## 2024-06-02 MED ADMIN — ceFAZolin (ANCEF) IVPB 2 g in 50 ml dextrose (premix): 2 g | INTRAVENOUS | @ 03:00:00 | Stop: 2024-06-02

## 2024-06-02 MED ADMIN — gabapentin (NEURONTIN) oral solution: 300 mg | ORAL | @ 03:00:00

## 2024-06-02 MED ADMIN — ziprasidone (GEODON) capsule 80 mg: 80 mg | ORAL | @ 12:00:00

## 2024-06-02 MED ADMIN — methocarbamol (ROBAXIN) 1,000 mg in sodium chloride (NS) 0.9 % 50 mL IVPB: 1000 mg | INTRAVENOUS | @ 21:00:00

## 2024-06-02 MED ADMIN — traZODone (DESYREL) tablet 200 mg: 200 mg | ORAL | @ 03:00:00

## 2024-06-02 MED ADMIN — docusate (COLACE) oral liquid: 50 mg | ORAL | @ 12:00:00

## 2024-06-02 MED ADMIN — lamoTRIgine (LaMICtal) chewable tablet 25 mg: 25 mg | ORAL | @ 15:00:00

## 2024-06-02 MED ADMIN — ziprasidone (GEODON) capsule 80 mg: 80 mg | ORAL | @ 03:00:00

## 2024-06-02 MED ADMIN — ondansetron (ZOFRAN-ODT) disintegrating tablet 4 mg: 4 mg | ORAL | @ 14:00:00

## 2024-06-02 MED ADMIN — metroNIDAZOLE (FLAGYL) IVPB 500 mg: 500 mg | INTRAVENOUS | @ 10:00:00 | Stop: 2024-06-02

## 2024-06-02 NOTE — Unmapped (Signed)
 Adult Nutrition   Note    Visit Type:    Reason for Visit:        HPI & PMH:    Per provider:  33 y.o. female with history of morbid obesity, [name] (HgbA1C 5.7), GERD, NASH, HTN, BPAD, [name], pulmonary hypertension, and HFpE admitted on 06/01/2024 for Roux-en-Y gastric bypass surgery.     Nutrition Focused Physical Exam:  Nutrition Focused Physical Exam:                Nutrition Evaluation  Overall Impressions: (P) Nutrition-Focused Physical Exam not indicated due to lack of malnutrition risk factors. (06/02/24 1227)    NUTRITIONALLY RELEVANT DATA     Nutritionally pertinent meds, labs, hospital course reviewed.    Magnesium  1.5 mg/dL (L) 92/82    Medications include: colace, coreg , zofran , protonix . Prn oxycodone .   Home medications include: calcium  carbonate vitamin (800 units/d), vitamin B12 (1000 mcg/d), mounjaro  (since 03/02/24), and metformin  (patient waiting to hear from pharmacy regarding post op medication regimen).     Food and Nutrition History:  Patient reports good PO intakes and tolerance to pre-surgery diet PTA. Patient expressed some upper abdominal pain and nausea during visit. Patient receiving zofran  for nausea and has had no diarrhea/emesis. Patient tolerating clear liquids at this time in small 3oz servings.  Discussed bariatric multivitamin and protein drinks upon discharge. Patient has Fusion chewable bariatric vitamins at bedside, and understands need to take 4 per day. Patient understanding of the need to take multi for remainder of life to prevent deficiencies. Patient endorses communicating with Dr. Trinidad regarding necessary medications related to Leukemia diagnosis that require swallowing, and that Dr. Trinidad performed the surgery with this understanding.  Patient had Premier Protein strawberry beverage ready for discharge. Patient expressed she had not yet tried these, but purchased them as her pre-op protein drink contained caffeine  (about 100 mg / 1 cup of coffee). Informed patient caffeine  intake of about 1 C of coffee per day was likely ok as diet progresses. Patient also expressed concern about milk products causing diarrhea. Patient reports plain milk causes diarrhea. Informed patient about looking for whey isolate in ingredient lists on protein drinks if she does not enjoy/tolerate the Premiers.   We reviewed post bariatric surgery diet (including but not limited to: diet stages, consistencies, meal intervals and volumes) with written and verbal information provided during RD visit today. Patient was receptive to information and expressed understanding.     Current Nutrition:  Oral intake      Malnutrition Assessment using AND/ASPEN or GLIM Clinical Characteristics:                  GOALS and EVALUATION     Food/Nutrition Knowledge, Lifestyle Modifications:       Patient will be willing and ready to learn about prescribed diet of Post Bariatric Surgery Diet   Patient will demonstrate verbal understanding of prescribed Post Bariatric Surgery Diet   Patient will make appropriate nutritional choices with regards to prescribed diet of Post Bariatric Surgery Diet    Assessment of motivation, barriers, or compliance:  Evaluation of motivation, barriers, and compliance completed. Concerns at this time include pain/discomfort and overall clinical clinical condition.    NUTRITION ASSESSMENT     Food and nutrition-related educational needs as related to new modified diet post surgery as evidenced by need for nutrition education prior to discharge.     Discharge Planning:   Monitor for potential discharge needs with multi-disciplinary team.  NUTRITION INTERVENTIONS and RECOMMENDATION     1. Patient educated on Post Bariatric Surgery Diet   2. Printed information given for patient to refer to post discharge.      RD Follow Up Parameters:  Signing off at this time (Please reconsult if needed)    Lauraine Tyrell Seifer  Dietetic Intern

## 2024-06-02 NOTE — Unmapped (Signed)
 Problem: Wound  Goal: Optimal Coping  Outcome: Ongoing - Unchanged  Goal: Optimal Functional Ability  Outcome: Ongoing - Unchanged  Goal: Absence of Infection Signs and Symptoms  Outcome: Ongoing - Unchanged  Goal: Improved Oral Intake  Outcome: Ongoing - Unchanged  Goal: Optimal Pain Control and Function  Outcome: Ongoing - Unchanged  Goal: Skin Health and Integrity  Outcome: Ongoing - Unchanged  Intervention: Optimize Skin Protection  Recent Flowsheet Documentation  Taken 06/02/2024 0820 by Harvey Chatters, RN  Pressure Reduction Techniques: frequent weight shift encouraged  Pressure Reduction Devices: positioning supports utilized  Skin Protection: adhesive use limited  Goal: Optimal Wound Healing  Outcome: Ongoing - Unchanged

## 2024-06-02 NOTE — Unmapped (Signed)
 Problem: Wound  Goal: Optimal Coping  06/02/2024 1621 by Harvey Chatters, RN  Outcome: Progressing  06/02/2024 1041 by Harvey Chatters, RN  Outcome: Ongoing - Unchanged  Goal: Optimal Functional Ability  06/02/2024 1621 by Harvey Chatters, RN  Outcome: Progressing  06/02/2024 1041 by Harvey Chatters, RN  Outcome: Ongoing - Unchanged  Goal: Absence of Infection Signs and Symptoms  06/02/2024 1621 by Harvey Chatters, RN  Outcome: Progressing  06/02/2024 1041 by Harvey Chatters, RN  Outcome: Ongoing - Unchanged  Goal: Improved Oral Intake  06/02/2024 1621 by Harvey Chatters, RN  Outcome: Progressing  06/02/2024 1041 by Harvey Chatters, RN  Outcome: Ongoing - Unchanged  Goal: Optimal Pain Control and Function  06/02/2024 1621 by Harvey Chatters, RN  Outcome: Progressing  06/02/2024 1041 by Harvey Chatters, RN  Outcome: Ongoing - Unchanged  Goal: Skin Health and Integrity  06/02/2024 1621 by Harvey Chatters, RN  Outcome: Progressing  06/02/2024 1041 by Harvey Chatters, RN  Outcome: Ongoing - Unchanged  Intervention: Optimize Skin Protection  Recent Flowsheet Documentation  Taken 06/02/2024 0820 by Harvey Chatters, RN  Pressure Reduction Techniques: frequent weight shift encouraged  Pressure Reduction Devices: positioning supports utilized  Skin Protection: adhesive use limited  Goal: Optimal Wound Healing  06/02/2024 1621 by Harvey Chatters, RN  Outcome: Progressing  06/02/2024 1041 by Harvey Chatters, RN  Outcome: Ongoing - Unchanged

## 2024-06-02 NOTE — Unmapped (Signed)
 Problem: Wound  Goal: Optimal Coping  Outcome: Ongoing - Unchanged  Goal: Optimal Functional Ability  Outcome: Ongoing - Unchanged  Goal: Absence of Infection Signs and Symptoms  Outcome: Ongoing - Unchanged  Goal: Improved Oral Intake  Outcome: Ongoing - Unchanged  Goal: Optimal Pain Control and Function  Outcome: Ongoing - Unchanged  Goal: Skin Health and Integrity  Outcome: Ongoing - Unchanged  Goal: Optimal Wound Healing  Outcome: Ongoing - Unchanged     Problem: Adult Inpatient Plan of Care  Goal: Plan of Care Review  Outcome: Ongoing - Unchanged  Flowsheets (Taken 06/02/2024 0646)  Progress: no change  Plan of Care Reviewed With: patient  Goal: Patient-Specific Goal (Individualized)  Outcome: Ongoing - Unchanged  Goal: Absence of Hospital-Acquired Illness or Injury  Outcome: Ongoing - Unchanged  Intervention: Prevent Skin Injury  Recent Flowsheet Documentation  Taken 06/01/2024 2030 by Perley, Gunnison Chahal , RN  Positioning for Skin: Right  Intervention: Prevent and Manage VTE (Venous Thromboembolism) Risk  Recent Flowsheet Documentation  Taken 06/01/2024 2152 by Perley, Kittie Krizan , RN  Anti-Embolism Device Type: SCD, Knee  Anti-Embolism Device Status: On  Anti-Embolism Device Location: BLE  Taken 06/01/2024 2030 by Perley, Ayla Dunigan , RN  Anti-Embolism Device Type: SCD, Knee  Anti-Embolism Device Status: On  Anti-Embolism Device Location: BLE  Goal: Optimal Comfort and Wellbeing  Outcome: Ongoing - Unchanged  Goal: Readiness for Transition of Care  Outcome: Ongoing - Unchanged  Goal: Rounds/Family Conference  Outcome: Ongoing - Unchanged     Problem: Self-Care Deficit  Goal: Improved Ability to Complete Activities of Daily Living  Outcome: Ongoing - Unchanged     Problem: Skin Injury Risk Increased  Goal: Skin Health and Integrity  Outcome: Ongoing - Unchanged     Problem: Fall Injury Risk  Goal: Absence of Fall and Fall-Related Injury  Outcome: Ongoing - Unchanged

## 2024-06-02 NOTE — Unmapped (Signed)
 Care Management  Initial Transition Planning Assessment              General  Care Manager / Social Worker assessed the patient by : Telephone conversation with patient, Medical record review, Discussion with Clinical Care team  Orientation Level: Oriented X4  Functional level prior to admission: Independent  Reason for referral: Discharge Planning    CM spoke with pt. Via phone, pt. Lives with her mother for support. Pt. Doesn't use any DME or services. Per IDR pt. Is posop 1 RY gastric bypass, pt. Having trouble voiding. Medical team bladder scanning q4hrs/ and I&O. CM following.       Type of Residence: Mailing Address:  [address]  [place] [address]  Contacts:    Patient Phone Number: [phone] (home)           Medical Provider(s): [name], MD  Reason for Admission: Admitting Diagnosis:  Class 3 severe obesity due to excess calories with serious comorbidity and body mass index (BMI) of 45.0 to 49.9 in adult (CMS-HCC) [phone], Z68.42]  Past Medical History:   has a past medical history of Abdominal pain, RUQ (01/08/2018), Abnormal Pap smear (09/28/2012), Anemia, Anxiety, Arthritis, Binge eating, Bipolar disorder, Cancer, CHF (congestive heart failure), Chronic kidney disease, Diabetes mellitus, Fatty liver, GERD (gastroesophageal reflux disease), Hyperlipidemia, Hypertension, Major depressive disorder, Migraine, Obesity, Obsessive-compulsive disorder, Obstructive sleep apnea (02/20/2010), Panic disorder, Peripheral neuropathy (03/14/2013), Prior Outpatient Treatment/Testing (06/15/2017), Psychiatric Hospitalizations (06/15/2017), Psychiatric Medication Trials (06/15/2017), PTSD (post-traumatic stress disorder) (06/15/2017), Pulmonary arterial hypertension, Restrictive lung disease, Schizo affective schizophrenia, Self-injurious behavior (06/15/2017), Suicidal ideation (06/15/2017), and Thyromegaly (02/04/2021).  Past Surgical History:   has a past surgical history that includes Tonsillectomy; Lymphangioma excision; Colonoscopy (2011); Skin biopsy; pr upper gi endoscopy,biopsy (N/A, 10/24/2019); removal of impacted tooth partially bony (Right, 07/16/2020); surgical removal (Bilateral, 07/16/2020); Wisdom tooth extraction; pr right heart cath o2 saturation & cardiac output (N/A, 03/04/2023); pr up gi endoscopy,remv tumor,snare (N/A, 12/21/2023); pr upper gi endoscopy,biopsy (12/21/2023); pr upper gi endoscopy,biopsy (N/A, 04/13/2024); pr lap gastric bypass/roux-en-y (N/A, 06/01/2024); pr tap block bilateral by injection(s) (Bilateral, 06/01/2024); pr upper gi endoscopy,diagnosis (Midline, 06/01/2024); and pr wedge biopsy of liver (N/A, 06/01/2024).   Previous admit date: 03/15/2023    Primary Insurance- Payor: [place] [employer] / Plan: [place] [employer] / Product Type: *No Product type* /   Secondary Insurance - None    Preferred Pharmacy - [facility] - [place], [place] - [address]    Transportation home: Private vehicle    Contact/Decision Maker  Extended Emergency Contact Information  Primary Emergency Contact: Thompson,Denise  Address: [address] [place], [place] [address] [place]  Home Phone: [phone]  Mobile Phone: [phone]  Relation: Mother  Preferred language: ENGLISH  Interpreter needed? No    Legal Next of Kin / Guardian / POA / Advance Directives     HCDM (patient stated preference) (Active): Sebastian Aquas - Mother - [phone]    Advance Directive (Medical Treatment)  Does patient have an advance directive covering medical treatment?: Patient does not have advance directive covering medical treatment.    Health Care Decision Maker [HCDM] (Medical & Mental Health Treatment)  Healthcare Decision Maker: HCDM documented in the HCDM/Contact Info section.    Advance Directive (Mental Health Treatment)  Does patient have an advance directive covering mental health treatment?: Patient does not have advance directive covering  mental health treatment.  Reason patient does not have an advance directive covering mental health treatment:: Patient does not wish to complete one at this time.    Readmission Information    Have you been hospitalized in the last 30 days?: No     Did the following happen with your discharge?      Patient Information  Lives with: Parent    Type of Residence: Private residence     Support Systems/Concerns: Parent    Responsibilities/Dependents at home?: No    Home Care services in place prior to admission?: No     Equipment Currently Used at Home: none       Currently receiving outpatient dialysis?: No       Financial Information       Need for financial assistance?: No       Social Determinants of Health  Social Drivers of Health     Food Insecurity: No Food Insecurity (06/02/2024)    Hunger Vital Sign     Worried About Running Out of Food in the Last Year: Never true     Ran Out of Food in the Last Year: Never true   Tobacco Use: Medium Risk (05/24/2024)    Patient History     Smoking Tobacco Use: Former     Smokeless Tobacco Use: Never     Passive Exposure: Past   Transportation Needs: No Transportation Needs (06/02/2024)    PRAPARE - Transportation     Lack of Transportation (Medical): No     Lack of Transportation (Non-Medical): No   Alcohol  Use: Not At Risk (12/31/2023)    Alcohol  Use     How often do you have a drink containing alcohol ?: Never     How many drinks containing alcohol  do you have on a typical day when you are drinking?: Not on file     How often do you have 5 or more drinks on one occasion?: Never   Housing: Low Risk  (06/02/2024)    Housing     Within the past 12 months, have you ever stayed: outside, in a car, in a tent, in an overnight shelter, or temporarily in someone else's home (i.e. couch-surfing)?: No     Are you worried about losing your housing?: No   Physical Activity: Not on file   Utilities: Low Risk  (12/31/2023)    Utilities     Within the past 12 months, have you been unable to get utilities (heat, electricity) when it was really needed?: No   Stress: Not on file   Interpersonal Safety: Not on file   Substance Use: Low Risk  (12/31/2023)    Substance Use     In the past year, how often have you used prescription drugs for non-medical reasons?: Never     In the past year, how often have you used illegal drugs?: Never     In the past year, have you used any substance for non-medical reasons?: No   Intimate Partner Violence: Not on file   Social Connections: Not on file   Financial Resource Strain: Low Risk  (06/02/2024)    Overall Financial Resource Strain (CARDIA)     Difficulty of Paying Living Expenses: Not very hard   Health Literacy: Medium Risk (05/14/2021)    Health Literacy     : Sometimes   Internet Connectivity: Not on file       Complex Discharge Information    Is patient identified as a difficult/complex discharge?: No  Interventions:       Discharge Needs Assessment  Concerns to be Addressed: discharge planning    Clinical Risk Factors: New Diagnosis, Multiple Diagnoses (Chronic)    Barriers to taking medications: No    Prior overnight hospital stay or ED visit in last 90 days: No    Equipment Needed After Discharge:  (TBD)    Discharge Facility/Level of Care Needs:  (TBD)    Readmission  Risk of Unplanned Readmission Score: UNPLANNED READMISSION SCORE: 9.77%  Predictive Model Details          18% (Medium)  Factor Value    Calculated 06/02/2024 12:29 31% Number of active inpatient medication orders 43    [place] Risk of Unplanned Readmission Model 11% Diagnosis of cancer present     10% Active antipsychotic inpatient medication order present     10% ECG/EKG order present in last 6 months     10% Charlson Comorbidity Index 8     8% Diagnosis of electrolyte disorder present     6% Phosphorous result present     5% Active anticoagulant inpatient medication order present     4% Diagnosis of renal failure present     3% Age 33     2% Future appointment scheduled     1% Current length of stay 1.044 days     1% Active ulcer inpatient medication order present      Readmitted Within the Last 30 Days? (No if blank)        Discharge Plan  Screen findings are: Care Manager reviewed the plan of the patient's care with the Multidisciplinary Team. No discharge planning needs identified at this time. Care Manager will continue to manage plan and monitor patient's progress with the team.    Expected Discharge Date: 06/03/2024    Expected Transfer from Critical Care:      Initial Assessment complete?: Yes

## 2024-06-02 NOTE — Unmapped (Addendum)
 Jenna Mosley is a 33 y.o. female with history of morbid obesity, [name] (HgbA1C 5.7), GERD, NASH, HTN, BPAD, [name], pulmonary hypertension, and HFpE admitted on 06/01/2024 for Roux-en-Y gastric bypass surgery    S/p Roux-en-Y gastric bypass surgery  The patient was taken to the OR on 7/16 for a Roux-en-Y laparoscopic gastric bypass.  She tolerated the procedure well, was extubated in the OR, and was taken to the PACU where she received routine postoperative care before being transferred to the floor. She did well postoperatively and her diet was advanced per protocol to bariatric full liquid diet. By discharge was able to void spontaneously, have her pain controlled with liquid pain medication, and ambulate with minimal assistance.  She is being discharged home in stable condition.

## 2024-06-02 NOTE — Unmapped (Signed)
 Upper GI Surgery Progress Note    Hospital Day: 2    Assessment:     Jenna Mosley is a 33 y.o. female with history of morbid obesity, [name] (HgbA1C 5.7), GERD, NASH, HTN, BPAD, [name], pulmonary hypertension, and HFpE admitted on 06/01/2024 for Roux-en-Y gastric bypass surgery.    Interval Events:   Has had chest pain with normal EKG x2 (prolonged QT, normal sinus). Simethacone has helped to relieve the pain (likely gas pain). She has not voided spontaneously yet. Pain controlled. VSS, on RA. Tolerating CLD diet with no nausea or vomiting. Has not passed flatus or had a bowel movement.       Objective:        Vital Signs:  BP 143/80  - Pulse 73  - Temp 36.9 ??C (98.4 ??F) (Oral)  - Resp 17  - SpO2 97%     Input/Output:  I/O last 3 completed shifts:  In: 2387.3 [I.V.:1900.3; IV Piggyback:487]  Out: 1460 [Urine:1210; Emesis/NG output:200; Blood:50]  No intake/output data recorded.      Physical Exam:    General: Cooperative, no distress, well appearing female  Head: Normocephalic, atraumatic  Pulmonary: Normal work of breathing, equal bilateral chest rise  Abdomen: Soft, non-distended, non-tender. Incisions c/d/I  Skin: No jaundice, rashes, erythema, or lesions. Skin color and texture normal.  Neurologic: Alert and interactive, no motor abnormalities noted.     Labs:  Lab Results   Component Value Date    WBC 10.6 06/02/2024    HGB 12.8 06/02/2024    HCT 36.8 06/02/2024    PLT 174 06/02/2024       Lab Results   Component Value Date    NA 139 06/02/2024    K 3.6 06/02/2024    CL 98 06/02/2024    CO2 27.0 06/02/2024    BUN 14 06/02/2024    CREATININE 0.82 06/02/2024    GLU 170 06/02/2024    CALCIUM  8.9 06/02/2024    MG 1.5 (L) 06/02/2024    PHOS 3.8 06/02/2024       Lab Results   Component Value Date    BILITOT 0.3 04/04/2024    BILIDIR 0.10 12/31/2022    PROT 7.7 04/04/2024    ALBUMIN 3.9 04/04/2024    ALT 14 04/04/2024    AST 26 04/04/2024    ALKPHOS 186 (H) 04/04/2024    GGT 35 02/11/2013       Lab Results Component Value Date    PT 11.8 12/31/2022    INR 1.07 12/31/2022    APTT 27.7 08/02/2019        Imaging:  No results found.    Plan:   S/p Roux-en-Y gastric bypass   - Has not spontaneously voided post-op; bladder scan q4, I/O cath  - Has chest pain most likely 2/2 gas; increase Simethicone  to q4 PRN (previously q6)      Neuro/Pain:  - MMPC; PO tylenol , dilaudid, robaxin     Cardiovascular:  - HDS     Pulmonary:  - SORA  - IS/OOB/ambulate     FEN/GI:  - F: mIVF  - E: replete as needed  - N: FLD   - Bowel regimen: none  - Nausea control: Zofran , phenergan, scopolamine  patch  - Simethicone  q4 PRN     GU/Renal:  - Bladder scan q4, IO cath if retaining greater than      Heme/ID:  - Hgb stable  - Afebrile, no leukocytosis  - Restart lovenox  DVT ppx  Endocrine:  - Euglycemic    Dispo: floor status      Please page [phone] with questions or concerns.    Clancy Jointer, MS3    Corean Door, MD (PGY-1)    Attestation    I attest that I have reviewed the student note and that the components of the history of the present illness, the physical exam, and the assessment and plan documented were performed by me or were performed in my presence by the student where I verified the documentation and performed (or re-performed) the exam and medical decision making.     [name], MD, MPH  06/11/2024  8:59 AM

## 2024-06-03 LAB — BASIC METABOLIC PANEL
ANION GAP: 11 mmol/L (ref 5–14)
ANION GAP: 11 mmol/L (ref 5–14)
BLOOD UREA NITROGEN: 8 mg/dL — ABNORMAL LOW (ref 9–23)
BLOOD UREA NITROGEN: 6 mg/dL — ABNORMAL LOW (ref 9–23)
BUN / CREAT RATIO: 11
BUN / CREAT RATIO: 10
CALCIUM: 9.3 mg/dL (ref 8.7–10.4)
CALCIUM: 9.1 mg/dL (ref 8.7–10.4)
CHLORIDE: 101 mmol/L (ref 98–107)
CHLORIDE: 104 mmol/L (ref 98–107)
CO2: 29 mmol/L (ref 20.0–31.0)
CO2: 26 mmol/L (ref 20.0–31.0)
CREATININE: 0.73 mg/dL (ref 0.55–1.02)
CREATININE: 0.63 mg/dL (ref 0.55–1.02)
EGFR CKD-EPI (2021) FEMALE: >90 mL/min/1.73m2 (ref >=60)
EGFR CKD-EPI (2021) FEMALE: >90 mL/min/1.73m2 (ref >=60)
GLUCOSE RANDOM: 161 mg/dL (ref 70–179)
GLUCOSE RANDOM: 118 mg/dL (ref 70–179)
POTASSIUM: 3.3 mmol/L — ABNORMAL LOW (ref 3.4–4.8)
POTASSIUM: 4 mmol/L (ref 3.5–5.1)
SODIUM: 141 mmol/L (ref 135–145)
SODIUM: 141 mmol/L (ref 135–145)

## 2024-06-03 LAB — CBC
HEMATOCRIT: 38.2 % (ref 34.0–44.0)
HEMOGLOBIN: 12.8 g/dL (ref 11.3–14.9)
MEAN CORPUSCULAR HEMOGLOBIN CONC: 33.4 g/dL (ref 32.0–36.0)
MEAN CORPUSCULAR HEMOGLOBIN: 28 pg (ref 25.9–32.4)
MEAN CORPUSCULAR VOLUME: 84 fL (ref 77.6–95.7)
MEAN PLATELET VOLUME: 8.9 fL (ref 6.8–10.7)
PLATELET COUNT: 153 10*9/L (ref 150–450)
RED BLOOD CELL COUNT: 4.55 10*12/L (ref 3.95–5.13)
RED CELL DISTRIBUTION WIDTH: 14.4 % (ref 12.2–15.2)
WBC ADJUSTED: 6.3 10*9/L (ref 3.6–11.2)

## 2024-06-03 LAB — PHOSPHORUS
PHOSPHORUS: 1.8 mg/dL — ABNORMAL LOW (ref 2.4–5.1)
PHOSPHORUS: 1.7 mg/dL — ABNORMAL LOW (ref 2.4–5.1)

## 2024-06-03 LAB — MAGNESIUM
MAGNESIUM: 1.6 mg/dL (ref 1.6–2.6)
MAGNESIUM: 2.2 mg/dL (ref 1.6–2.6)

## 2024-06-03 MED ORDER — SIMETHICONE 40 MG/0.6 ML ORAL DROPS,SUSPENSION
ORAL | 0 refills | 9.00000 days | Status: CP | PRN
Start: 2024-06-03 — End: 2024-09-01
  Filled 2024-06-04: qty 15, 5d supply, fill #0

## 2024-06-03 MED ORDER — ZIPRASIDONE 80 MG CAPSULE
ORAL_CAPSULE | Freq: Two times a day (BID) | ORAL | 2 refills | 30.00000 days | Status: CP
Start: 2024-06-03 — End: 2024-06-03

## 2024-06-03 MED ORDER — ONDANSETRON 4 MG DISINTEGRATING TABLET
ORAL_TABLET | Freq: Four times a day (QID) | ORAL | 1 refills | 8.00000 days | Status: CP | PRN
Start: 2024-06-03 — End: 2024-07-03
  Filled 2024-06-04: qty 30, 8d supply, fill #0

## 2024-06-03 MED ORDER — CARVEDILOL 6.25 MG TABLET
ORAL_TABLET | Freq: Two times a day (BID) | ORAL | 0 refills | 90.00000 days | Status: CP
Start: 2024-06-03 — End: 2024-06-03

## 2024-06-03 MED ORDER — TRAZODONE 100 MG TABLET
ORAL_TABLET | Freq: Every evening | ORAL | 3 refills | 90.00000 days | Status: CP
Start: 2024-06-03 — End: 2024-06-03

## 2024-06-03 MED ORDER — GABAPENTIN 250 MG/5 ML ORAL SOLUTION
Freq: Three times a day (TID) | ORAL | 0 refills | 5.00000 days | Status: CP
Start: 2024-06-03 — End: 2024-06-08
  Filled 2024-06-04: qty 90, 5d supply, fill #0

## 2024-06-03 MED ORDER — PANTOPRAZOLE 40 MG TABLET,DELAYED RELEASE
ORAL_TABLET | Freq: Every day | ORAL | 0 refills | 182.00000 days | Status: CP
Start: 2024-06-03 — End: 2024-06-03

## 2024-06-03 MED ORDER — OMEPRAZOLE 20 MG CAPSULE,DELAYED RELEASE
ORAL_CAPSULE | Freq: Every day | ORAL | 0 refills | 180.00000 days | Status: CP
Start: 2024-06-03 — End: 2024-11-30
  Filled 2024-06-04: qty 90, 90d supply, fill #0

## 2024-06-03 MED ORDER — ACETAMINOPHEN 160 MG/5 ML ORAL LIQUID
Freq: Four times a day (QID) | ORAL | 0 refills | 4.00000 days | Status: CP | PRN
Start: 2024-06-03 — End: ?
  Filled 2024-06-04: qty 473, 4d supply, fill #0

## 2024-06-03 MED ORDER — ENOXAPARIN 40 MG/0.4 ML SUBCUTANEOUS SYRINGE
Freq: Two times a day (BID) | SUBCUTANEOUS | 0 refills | 30.00000 days | Status: CP
Start: 2024-06-03 — End: ?

## 2024-06-03 MED ORDER — OXYCODONE 5 MG/5 ML ORAL SOLUTION
ORAL | 0 refills | 4.00000 days | Status: CP | PRN
Start: 2024-06-03 — End: 2024-06-08
  Filled 2024-06-04: qty 100, 4d supply, fill #0

## 2024-06-03 MED ORDER — METHOCARBAMOL 500 MG TABLET
ORAL_TABLET | Freq: Three times a day (TID) | ORAL | 0 refills | 10.00000 days | Status: CP | PRN
Start: 2024-06-03 — End: ?
  Filled 2024-06-04: qty 30, 10d supply, fill #0

## 2024-06-03 MED ORDER — DIVALPROEX 125 MG CAPSULE,DELAYED RELEASE SPRINKLE
ORAL_CAPSULE | Freq: Every evening | ORAL | 2 refills | 30.00000 days | Status: CP
Start: 2024-06-03 — End: 2024-09-01
  Filled 2024-06-04: qty 120, 30d supply, fill #0

## 2024-06-03 MED ADMIN — lactated Ringers infusion: 125 mL/h | INTRAVENOUS | @ 04:00:00 | Stop: 2024-06-03

## 2024-06-03 MED ADMIN — oxyCODONE (ROXICODONE) 5 mg/5 mL solution 5 mg: 5 mg | ORAL | @ 23:00:00 | Stop: 2024-06-15

## 2024-06-03 MED ADMIN — enoxaparin (LOVENOX) syringe 40 mg: 40 mg | SUBCUTANEOUS | @ 01:00:00

## 2024-06-03 MED ADMIN — acetaminophen (TYLENOL) oral liquid: 1000 mg | ORAL | @ 04:00:00

## 2024-06-03 MED ADMIN — cetirizine (ZYRTEC) tablet 10 mg: 10 mg | ORAL | @ 14:00:00

## 2024-06-03 MED ADMIN — potassium chloride (KLOR-CON) packet 40 mEq: 40 meq | ORAL | @ 19:00:00 | Stop: 2024-06-03

## 2024-06-03 MED ADMIN — magnesium sulfate 2gm/50mL IVPB: 2 g | INTRAVENOUS | @ 20:00:00 | Stop: 2024-06-03

## 2024-06-03 MED ADMIN — lactated ringers bolus 1,000 mL: 1000 mL | INTRAVENOUS | @ 19:00:00 | Stop: 2024-06-03

## 2024-06-03 MED ADMIN — ziprasidone (GEODON) capsule 80 mg: 80 mg | ORAL | @ 23:00:00

## 2024-06-03 MED ADMIN — gabapentin (NEURONTIN) oral solution: 300 mg | ORAL | @ 10:00:00

## 2024-06-03 MED ADMIN — oxyCODONE (ROXICODONE) 5 mg/5 mL solution 5 mg: 5 mg | ORAL | @ 18:00:00 | Stop: 2024-06-15

## 2024-06-03 MED ADMIN — gabapentin (NEURONTIN) oral solution: 300 mg | ORAL | @ 01:00:00

## 2024-06-03 MED ADMIN — ondansetron (ZOFRAN-ODT) disintegrating tablet 4 mg: 4 mg | ORAL | @ 01:00:00

## 2024-06-03 MED ADMIN — oxyCODONE (ROXICODONE) 5 mg/5 mL solution 5 mg: 5 mg | ORAL | @ 14:00:00 | Stop: 2024-06-15

## 2024-06-03 MED ADMIN — traZODone (DESYREL) tablet 200 mg: 200 mg | ORAL | @ 01:00:00

## 2024-06-03 MED ADMIN — simethicone (MYLICON) oral drops: 40 mg | ORAL | @ 18:00:00

## 2024-06-03 MED ADMIN — pantoprazole (Protonix) injection 40 mg: 40 mg | INTRAVENOUS | @ 14:00:00

## 2024-06-03 MED ADMIN — prochlorperazine (COMPAZINE) injection 5 mg: 5 mg | INTRAVENOUS

## 2024-06-03 MED ADMIN — magnesium sulfate 2gm/50mL IVPB: 2 g | INTRAVENOUS | @ 22:00:00 | Stop: 2024-06-03

## 2024-06-03 MED ADMIN — lamoTRIgine (LaMICtal) chewable tablet 25 mg: 25 mg | ORAL | @ 14:00:00

## 2024-06-03 MED ADMIN — carvedilol (COREG) tablet 6.25 mg: 6.25 mg | ORAL | @ 14:00:00

## 2024-06-03 MED ADMIN — methocarbamol (ROBAXIN) 1,000 mg in sodium chloride (NS) 0.9 % 50 mL IVPB: 1000 mg | INTRAVENOUS | @ 05:00:00

## 2024-06-03 MED ADMIN — ondansetron (ZOFRAN) injection 4 mg: 4 mg | INTRAVENOUS | @ 08:00:00 | Stop: 2024-06-03

## 2024-06-03 MED ADMIN — carvedilol (COREG) tablet 6.25 mg: 6.25 mg | ORAL | @ 01:00:00

## 2024-06-03 MED ADMIN — oxyCODONE (ROXICODONE) 5 mg/5 mL solution 5 mg: 5 mg | ORAL | @ 01:00:00 | Stop: 2024-06-15

## 2024-06-03 MED ADMIN — divalproex (DEPAKOTE) DR tablet 500 mg: 500 mg | ORAL | @ 01:00:00

## 2024-06-03 MED ADMIN — gabapentin (NEURONTIN) oral solution: 300 mg | ORAL | @ 18:00:00

## 2024-06-03 MED ADMIN — methocarbamol (ROBAXIN) 1,000 mg in sodium chloride (NS) 0.9 % 50 mL IVPB: 1000 mg | INTRAVENOUS | @ 22:00:00

## 2024-06-03 MED ADMIN — docusate (COLACE) oral liquid: 50 mg | ORAL | @ 14:00:00

## 2024-06-03 MED ADMIN — oxyCODONE (ROXICODONE) 5 mg/5 mL solution 5 mg: 5 mg | ORAL | @ 10:00:00 | Stop: 2024-06-15

## 2024-06-03 MED ADMIN — methocarbamol (ROBAXIN) 1,000 mg in sodium chloride (NS) 0.9 % 50 mL IVPB: 1000 mg | INTRAVENOUS | @ 14:00:00

## 2024-06-03 MED ADMIN — simethicone (MYLICON) oral drops: 40 mg | ORAL | @ 12:00:00

## 2024-06-03 MED ADMIN — docusate (COLACE) oral liquid: 50 mg | ORAL | @ 01:00:00

## 2024-06-03 MED ADMIN — ziprasidone (GEODON) capsule 80 mg: 80 mg | ORAL | @ 14:00:00

## 2024-06-03 NOTE — Unmapped (Signed)
 Pt's VSS throughout shift. Tolerated ambulating in room and sitting up in chair. Pain and sharp gas reported around q4. Meds administered per order. Transferred to around 1900.    Problem: Wound  Goal: Optimal Coping  Outcome: Progressing  Goal: Optimal Functional Ability  Outcome: Progressing  Goal: Absence of Infection Signs and Symptoms  Outcome: Progressing  Goal: Improved Oral Intake  Outcome: Progressing  Goal: Optimal Pain Control and Function  Outcome: Progressing  Goal: Skin Health and Integrity  Outcome: Progressing  Goal: Optimal Wound Healing  Outcome: Progressing

## 2024-06-03 NOTE — Unmapped (Signed)
 Intermediate status. VSS on RA. OOB to chair and bathroom overnight, encouraged to ambulate to move gas - patient ambulated successfully around the unit but did not report relief of gas pain. PRNs given for pain/flatulence. Tolerating diet. Voiding, no residuals. PIV x1, LR infusing per order.     Problem: Adult Inpatient Plan of Care  Goal: Absence of Hospital-Acquired Illness or Injury  Intervention: Identify and Manage Fall Risk  Recent Flowsheet Documentation  Taken 06/02/2024 2000 by Earlean Arleen BROCKS, RN  Safety Interventions:   bariatric safety   bleeding precautions   fall reduction program maintained   lighting adjusted for tasks/safety   low bed   nonskid shoes/slippers when out of bed  Intervention: Prevent Skin Injury  Recent Flowsheet Documentation  Taken 06/02/2024 2000 by Earlean Arleen BROCKS, RN  Positioning for Skin: (Turns Self) Other (Comment)  Device Skin Pressure Protection: absorbent pad utilized/changed  Skin Protection: incontinence pads utilized  Intervention: Prevent and Manage VTE (Venous Thromboembolism) Risk  Recent Flowsheet Documentation  Taken 06/02/2024 2000 by Earlean Arleen BROCKS, RN  Anti-Embolism Device Type: SCD, Knee  Anti-Embolism Device Status: On  Anti-Embolism Device Location: BLE  Intervention: Prevent Infection  Recent Flowsheet Documentation  Taken 06/02/2024 2000 by Earlean Arleen BROCKS, RN  Infection Prevention:   cohorting utilized   environmental surveillance performed   equipment surfaces disinfected   hand hygiene promoted   personal protective equipment utilized   rest/sleep promoted   single patient room provided     Problem: Wound  Goal: Optimal Functional Ability  Intervention: Optimize Functional Ability  Recent Flowsheet Documentation  Taken 06/02/2024 2000 by Earlean Arleen BROCKS, RN  Activity Management:   bedrest   up ad lib   up in chair  Goal: Absence of Infection Signs and Symptoms  Intervention: Prevent or Manage Infection  Recent Flowsheet Documentation  Taken 06/02/2024 2000 by Earlean Arleen BROCKS, RN  Infection Management: aseptic technique maintained  Goal: Skin Health and Integrity  Intervention: Optimize Skin Protection  Recent Flowsheet Documentation  Taken 06/02/2024 2000 by Earlean Arleen BROCKS, RN  Activity Management:   bedrest   up ad lib   up in chair  Pressure Reduction Techniques: frequent weight shift encouraged  Head of Bed (HOB) Positioning: HOB elevated  Pressure Reduction Devices: pressure-redistributing mattress utilized  Skin Protection: incontinence pads utilized     Problem: Fall Injury Risk  Goal: Absence of Fall and Fall-Related Injury  Intervention: Promote Injury-Free Management consultant Documentation  Taken 06/02/2024 2000 by Earlean Arleen BROCKS, RN  Safety Interventions:   bariatric safety   bleeding precautions   fall reduction program maintained   lighting adjusted for tasks/safety   low bed   nonskid shoes/slippers when out of bed     Problem: Skin Injury Risk Increased  Goal: Skin Health and Integrity  Intervention: Optimize Skin Protection  Recent Flowsheet Documentation  Taken 06/02/2024 2000 by Earlean Arleen BROCKS, RN  Activity Management:   bedrest   up ad lib   up in chair  Pressure Reduction Techniques: frequent weight shift encouraged  Head of Bed (HOB) Positioning: HOB elevated  Pressure Reduction Devices: pressure-redistributing mattress utilized  Skin Protection: incontinence pads utilized

## 2024-06-03 NOTE — Unmapped (Signed)
 [facility] Discharge Summary     Identifying Information: Jenna Mosley  September 03, 1991  [id number]   Code Status: Full Code   PCP:  [name], MD   Primary Surgeon:  Primary: Trinidad Annitta Hasten, MD  Resident - Assisting: Myra Jolaine BROCKS, MD       Admit date: 06/01/2024   Discharge date: 06/04/2024   Discharge Attending: Annitta Hasten Trinidad, MD  Discharge Service: Cleopatra Berke [facility])  Discharge to: Home     Discharge Primary Diagnosis:   S/p roux-en-y gastric bypass    Secondary Diagnosis:    Principal Problem:    Class 3 severe obesity due to excess calories with serious comorbidity and body mass index (BMI) of 45.0 to 49.9 in adult (CMS-HCC)  Active Problems:    Esophageal reflux    Essential (primary) hypertension    Bipolar I disorder: With psychotic features, Current or most recent episode depressed, with mixed features (CMS-HCC)    Schizo affective schizophrenia       PTSD (post-traumatic stress disorder)    Type 2 diabetes mellitus with diabetic neuropathy, with long-term current use of insulin        [name] (chronic myelocytic leukemia)       NASH (nonalcoholic steatohepatitis)    Pulmonary hypertension       Operations:   Laparoscopic roux-en-y gastroenterostomy    Ancillary Procedures:  None    Hospital Course   Jenna Mosley is a 33 y.o. female with history of morbid obesity, [name] (HgbA1C 5.7), GERD, NASH, HTN, BPAD, [name], pulmonary hypertension, and HFpE admitted on 06/01/2024 for Roux-en-Y gastric bypass surgery    S/p Roux-en-Y gastric bypass surgery  The patient was taken to the OR on 7/16 for a Roux-en-Y laparoscopic gastric bypass.  She tolerated the procedure well, was extubated in the OR, and was taken to the PACU where she received routine postoperative care before being transferred to the floor. She did well postoperatively and her diet was advanced per protocol to bariatric full liquid diet. By discharge was able to void spontaneously, have her pain controlled with liquid pain medication, and ambulate with minimal assistance.  She is being discharged home in stable condition.    Condition at Discharge: good  Patient is back to their functional baseline and is self-managing all activites of daily living.     Discharge Day Note:     Assessment and Discharge Day Services:  The patient was seen and examined by the [facility]) team on the day of discharge. Vital signs and laboratory values were assessed. Surgical wounds were inspected. Discharge plan was discussed, instructions were given, and all questions answered.     Subjective:  No acute events overnight. Pain Controlled. No fever or chills.Patient is tolerating diet. She is voiding without issue.     Objective:   BP 125/71  - Pulse 76  - Temp 36.9 ??C (98.4 ??F) (Oral)  - Resp 16  - SpO2 100%     General Appearance:   No acute distress  Abdomen:  soft, nontender, non-distended, no peritoneal signs, and incision clean, dry, intact  Discharge Medications:      Your Medication List        PAUSE taking these medications      cyclobenzaprine  5 MG tablet  Wait to take this until your doctor or other care provider tells you to start again.  May resume when no longer taking robaxin .  Commonly known as: FLEXERIL   Take 1  tablet (5 mg total) by mouth Three (3) times a day as needed (lower back pain).     FARXIGA  10 mg Tab tablet  Wait to take this until your doctor or other care provider tells you to start again.  Generic drug: dapagliflozin  propanediol  TAKE ONE TABLET BY MOUTH EVERY MORNING     ferrous sulfate  325 (65 FE) MG tablet  Wait to take this until your doctor or other care provider tells you to start again.  Take 1 tablet (325 mg total) by mouth in the morning.     HumuLIN  R U-500 (Conc) Kwikpen 500 unit/mL (3 mL) CONCENTRATED injection  Wait to take this until your doctor or other care provider tells you to start again.  Generic drug: insulin  regular hum U-500 conc  Inject 150 Units under the skin in the morning. pregabalin  75 MG capsule  Wait to take this until: June 30, 2024  Commonly known as: LYRICA   TAKE 1 CAPSULE BY MOUTH IN THE MORNING AND 2 CAPSULES BY MOUTH IN THE EVENING.     torsemide  20 MG tablet  Wait to take this until your doctor or other care provider tells you to start again.  Follow up with your PCP prior to restarting  Commonly known as: DEMADEX   TAKE 4 TABLETS BY MOUTH ONCE DAILY            STOP taking these medications      acetaminophen  500 MG tablet  Commonly known as: TYLENOL   Replaced by: acetaminophen  160 mg/5 mL solution     divalproex  500 MG DR tablet  Commonly known as: DEPAKOTE   Replaced by: divalproex  125 mg capsule     MOUNJARO  12.5 mg/0.5 mL Pnij  Generic drug: tirzepatide      pantoprazole  40 MG tablet  Commonly known as: Protonix             START taking these medications      acetaminophen  160 mg/5 mL solution  Commonly known as: TYLENOL   Take 31.2 mL (1,000 mg total) by mouth every six (6) hours as needed.  Replaces: acetaminophen  500 MG tablet     divalproex  125 mg capsule  Commonly known as: DEPAKOTE  SPRINKLE  Take 4 capsules (500 mg total) by mouth nightly.  Replaces: divalproex  500 MG DR tablet     enoxaparin  40 mg/0.4 mL Syrg  Commonly known as: LOVENOX   Inject 0.4 mL (40 mg total) under the skin every twelve (12) hours.     gabapentin  250 mg/5 mL oral solution  Commonly known as: NEURONTIN   Take 6 mL (300 mg total) by mouth every eight (8) hours for 5 days.     methocarbamol  500 MG tablet  Commonly known as: ROBAXIN   Take 1 tablet (500 mg total) by mouth Three (3) times a day as needed.     omeprazole  20 MG capsule  Commonly known as: PriLOSEC  Take 1 capsule (20 mg total) by mouth daily. Open capsule, sprinkle granules in liquid or mix with greek yogurt to take     oxyCODONE  5 mg/5 mL solution  Commonly known as: ROXICODONE   Take 5 mL (5 mg total) by mouth every four (4) hours as needed     simethicone  40 mg/0.6 mL drops  Commonly known as: MYLICON  Take 0.6 mL (40 mg total) by mouth every four (4) hours as needed.            CHANGE how you take these medications      ondansetron  4 MG  disintegrating tablet  Commonly known as: ZOFRAN -ODT  Dissolve 1 tablet (4 mg total) in the mouth every six (6) hours as needed for nausea.  What changed:   how to take this  when to take this            CONTINUE taking these medications      ACCU-CHEK GUIDE TEST STRIPS Strp  Generic drug: blood sugar diagnostic  Check sugars before meals three times for insulin  dependent type two diabetes. E11.65     albuterol  90 mcg/actuation inhaler  Commonly known as: PROVENTIL  HFA;VENTOLIN  HFA  Inhale 2 puffs every six (6) hours as needed.     alcohol  swabs  Padm  Commonly known as: ALCOHOL  PADS  Apply 1 Swab topically Three (3) times a day before meals.     BOSULIF  500 mg Tab  Generic drug: bosutinib  Take 1 tablet (500 mg total) by mouth once daily. Administer with food. Swallow tablet whole; do not cut, crush, break, or chew.     calcium  carbonate-vitamin D3 600 mg-20 mcg (800 unit) Tab  Take 1 mg by mouth Two (2) times a day (at 8am and 12:00).     carvedilol  6.25 MG tablet  Commonly known as: COREG   Take 1 tablet (6.25 mg total) by mouth two (2) times a day.     cetirizine  10 MG tablet  Commonly known as: ZYRTEC   TAKE 1 TABLET BY MOUTH IN THE MORNING.     clonazePAM  0.5 MG tablet  Commonly known as: KlonoPIN   Take 1 tablet (0.5 mg total) by mouth daily as needed. PRN     cyanocobalamin  (vitamin B-12) 1,000 mcg/mL Kit  Inject 1,000 mcg as directed every thirty (30) days.     DEXCOM G7 SENSOR Devi  Generic drug: blood-glucose sensor  Change sensor every 10 days.     fluticasone  propionate 50 mcg/actuation nasal spray  Commonly known as: FLONASE   2 sprays into each nostril daily.     hydrOXYzine  50 MG capsule  Commonly known as: VISTARIL   Take 2 capsules (100 mg total) by mouth nightly. And takes PRN     lamoTRIgine  25 MG tablet  Commonly known as: LaMICtal   Taking 1 tablet by mouth in the morning for 2 weeks then take 1 tablet by mouth twice a day for two weeks.     lancets Misc  Commonly known as: ACCU-CHEK SOFTCLIX LANCETS  Check sugar three times per day before meals for insulin  dependent type two diabetes.  E11.65     LUPRON  DEPOT IM  Inject into the muscle. Every 3 months     norethindrone  5 mg tablet  Commonly known as: AYGESTIN   TAKE ONE TABLET EVERY MORNING     TECHLITE PEN NEEDLE 32 gauge x 1/4 (6 mm) Ndle  Generic drug: pen needle, diabetic  USE 3 TIMES DAILY AS DIRECTED     traZODone  100 MG tablet  Commonly known as: DESYREL   TAKE 2 TABLETS BY MOUTH AT BEDTIME     VITAMIN D3 ORAL  Take by mouth.     ziprasidone  80 MG capsule  Commonly known as: GEODON   Take 1 capsule (80 mg total) by mouth in the morning and 1 capsule (80 mg total) in the evening. Take with meals.              Pending Test Results and Procedures:  Pending Labs       Order Current Status    Surgical pathology exam In process  Relevant Labs and Images at Discharge:  None    Discharge Instructions:  DISCHARGE INSTRUCTIONS AFTER  BARIATRIC SURGERY     What will I experience after my operation?  You may feel tired. It is important to take time to rest. Your incisions may feel sore. Some soreness is normal, but don't be afraid to take your pain medicine to ease the soreness.     What foods can I eat?  Follow the diet instructions provided in your binder and by the inpatient dietician team. Please ensure you continue the bariatric full liquid diet 3oz/hour. Continue adequate protein intake every day (60 g of protein or 2 protein shakes).     What medicines will I be taking?  Your doctor will review your medicines and make any prescription changes before you go home.  Here are some helpful hints on taking medicines:  - You will be on acid suppression medications   - Your medications will be reviewed by the surgical team and some changes may be made     What will I take if I have pain or nausea?  You will be given a prescription for an oral liquid medication for pain and a dissolving tablet for nausea. If you are not getting your medications at the [facility], it is a good idea to call your local pharmacy to be sure they have the medicines in stock. Take your pain medicine about 30 minutes before you shower or walk for the first few days after you get home. The nausea medicine should be taken to prevent retching (dry heaves) for any reason for 3-6 months after you go home. Taking this medicine helps reduce the risk of the stomach wrap coming undone.     How active can I be?  Being up and around helps prevent complications, like blood clots and lung infections and helps with healing. If your incisions hurt, you may need to cut back on activity for a while and then try again. You can do usual daily activities, walk up stairs and be outdoors. Do not drive or operate heavy machinery while taking narcotic medications.     What about lifting?  Do not lift more than 10 lbs. for at least 3 weeks after your operation. Talk about lifting and other activities with your surgeon at your clinic visit.     When can I return to work?  You can return to light duty once you feel better and are not taking narcotic pain medicines, usually in 1-2 weeks. If your job is strenuous, you may require 3 or more weeks off work. Please call the GI Surgery office at [phone] and we will help you with Work Leave documents.     How do I take care of my dressings?  Your wounds were glued.  The coating will peel off in 1-2 weeks.     When can I shower?  You can shower the day after surgery. Pat the wounds dry. Do not rub them with a towel. Do not soak in the bathtub or go swimming until incisions are healed (usually 2 weeks).     When should I call my surgeon?  Call us  if you have any of the following:  - Fever higher than 101.5 degrees F  - Not able to drink liquids  - Drainage or opening at an incision  - Incision that is reddened, firm or hot to touch  - Burning with urination or not being able to pass urine  -  Constipation lasting for more than 3 days    Whom can I call if I have any problems or questions?   - During regular business hours: (Monday-Friday, 8am-4:00 pm) you may call the GI Surgery Office at [phone].   - For emergencies hours business hours please call the [facility] operator at [phone] and ask for the Surgical Resident on-call.  You will be directed to a surgery resident who likely is not familiar with your specific case, but can help you deal with any emergencies that cannot wait until regular business hours.  Please be aware that this person is responding to many in-hospital emergencies and patient issues and may not answer your phone call immediately.   - If you did not receive your follow-up appointment details at the time of discharge, then contact the GI Surgery clinic at [phone].       Future Appointments:  Appointments which have been scheduled for you      Jun 07, 2024 3:40 PM  (Arrive by 3:25 PM)  RETURN DIABETES with Sheree Bong Garnett Schooner, MD  [facility] DIABETES AND ENDOCRINOLOGY [place] [facility] REGION) [address]  [facility] 1 through 4  [place] [address]  [phone]        Jun 24, 2024 12:00 PM  (Arrive by 11:30 AM)  RETURN GENERAL with Annitta Herminio Georgi, MD  [facility] MULTISPECIALTY SURGERY GI SURGERY [place] [facility] REGION) [address]  [place] [address]  [phone]        Jun 30, 2024 8:40 AM  (Arrive by 8:25 AM)  RETURN CARDIOLOGY with Franky Prentice Cutting, MD  [place] CARDIOLOGY [address] [facility] REGION) [address]  [address]  [place] [address]  [phone]        Jul 07, 2024 12:30 PM  (Arrive by 12:00 PM)  LAB ONLY [place] with ADULT ONC LAB  [facility] ADULT ONCOLOGY LAB [place] [facility] REGION) [address]  [place] [address]  [phone]        Jul 07, 2024 1:30 PM  (Arrive by 1:00 PM)  RETURN ACTIVE [place] with Corean Dena Cart, [place]  [place] HEMATOLOGY ONCOLOGY 2ND FLR CANCER HOSP [facility] REGION) [address]  [place] [address]  [phone]        Jul 15, 2024 11:10 AM  (Arrive by 10:55 AM)  RETURN VIDEO VISIT DIRECT LINK with Arlean CHRISTELLA Andrews, CPP  [facility] DIABETES AND ENDOCRINOLOGY [place] [facility] REGION) [address]  [facility] 1 through 4  [place] [address]  [phone]   Please sign into My [place] Chart at least 15 minutes before your appointment to complete the eCheck-In process. You must complete eCheck-In before you can start your video visit. We also recommend testing your audio and video connection to troubleshoot any issues before your visit begins. Click ???Join Video Visit??? to complete these checks. Once you have completed eCheck-In and tested your audio and video, click ???Join Call??? to connect to your visit.     For your video visit, you will need a computer with a working camera, speaker and microphone, a smartphone, or a tablet with internet access.    My  Chart enables you to manage your health, send non-urgent messages to your provider, view your test results, schedule and manage appointments, and request prescription refills securely and conveniently from your computer or mobile device.  You can go to [email or web address] to sign in to your My [place] Chart account with your username and password. If you have forgotten your username or password, please choose the ???Forgot Username???? and/or ???Forgot Password???? links to gain access. You also can access your My [place] Chart account with the free MyChart mobile app for Android or iPhone.    If you need assistance accessing your My [place] Chart account or for assistance in reaching your provider's office to reschedule or cancel your appointment, please call [facility] [phone].         Jul 19, 2024 1:45 PM  (Arrive by 1:30 PM)  NURSE VISIT with ONCINF HMOB LABS  [place] [facility] REGION) [address]  1st Floor  [place] [address]  [phone]        Oct 17, 2024 1:45 PM  (Arrive by 1:30 PM)  NURSE VISIT with ONCINF HMOB LABS  [facility] REGION) [address]  1st Floor  [place] [address]  [phone]               Note initiated by Tawni Blizzard, MS4    Corean Door, MD (PGY-1)

## 2024-06-03 NOTE — Unmapped (Signed)
 VENOUS ACCESS ULTRASOUND PROCEDURE NOTE    Indications:   Poor venous access.    The Venous Access Team has assessed this patient for the placement of a PIV. Ultrasound guidance was necessary to obtain access.     Procedure Details:  Identity of the patient was confirmed via name, medical record number and date of birth. The availability of the correct equipment was verified.    The vein was identified for ultrasound catheter insertion.  Field was prepared with necessary supplies and equipment.  Probe cover and sterile gel utilized.  Insertion site was prepped with chlorhexidine  solution and allowed to dry.  The catheter extension was primed with normal saline. A(n) 20 gauge 1.75 catheter was placed in the R Forearm with 1attempt(s). See LDA for additional details.    Catheter aspirated, 3 mL blood return present. The catheter was then flushed with 10 mL of normal saline. Insertion site cleansed, and dressing applied per manufacturer guidelines. The catheter was inserted with difficulty due to poor vasculature by Dwayne ONEIDA Sor, RN.    Primary RN was notified.     Thank you,     Dwayne ONEIDA Sor, RN Venous Access Team   [phone]     Workup / Procedure Time:  30 minutes    See images below:

## 2024-06-03 NOTE — Unmapped (Signed)
 Upper GI Surgery Progress Note    Hospital Day: 3    Assessment:     Jenna Mosley is a 33 y.o. female with history of morbid obesity, [name] (HgbA1C 5.7), GERD, NASH, HTN, BPAD, [name], pulmonary hypertension, and HFpE admitted on 06/01/2024 for Roux-en-Y gastric bypass surgery.    Interval Events:   NAEON. The patient walked the halls. She notes her pain is much better today. She has been tolerating the diet. She is passing flatus.     Objective:     Vital Signs:  BP 143/76  - Pulse 77  - Temp 37 ??C (98.6 ??F) (Oral)  - Resp 18  - SpO2 96%     Input/Output:  I/O last 3 completed shifts:  In: 5803.9 [P.O.:910; I.V.:4372.9; IV Piggyback:521]  Out: 1760 [Urine:1760]  I/O this shift:  In: 120 [P.O.:120]  Out: 0     Physical Exam:    General: Cooperative, no distress, well appearing female  Head: Normocephalic, atraumatic  Pulmonary: Normal work of breathing, equal bilateral chest rise  Abdomen: Soft, non-distended, non-tender. Incisions c/d/I. Abdominal binder in place  Skin: No jaundice, rashes, erythema, or lesions. Skin color and texture normal.  Neurologic: Alert and interactive, no motor abnormalities noted.     Labs:  Lab Results   Component Value Date    WBC 6.3 06/03/2024    HGB 12.8 06/03/2024    HCT 38.2 06/03/2024    PLT 153 06/03/2024       Lab Results   Component Value Date    NA 141 06/03/2024    K 3.3 (L) 06/03/2024    CL 101 06/03/2024    CO2 29.0 06/03/2024    BUN 8 (L) 06/03/2024    CREATININE 0.73 06/03/2024    GLU 161 06/03/2024    CALCIUM  9.3 06/03/2024    MG 1.6 06/03/2024    PHOS 1.8 (L) 06/03/2024       Lab Results   Component Value Date    BILITOT 0.3 04/04/2024    BILIDIR 0.10 12/31/2022    PROT 7.7 04/04/2024    ALBUMIN 3.9 04/04/2024    ALT 14 04/04/2024    AST 26 04/04/2024    ALKPHOS 186 (H) 04/04/2024    GGT 35 02/11/2013       Lab Results   Component Value Date    PT 11.8 12/31/2022    INR 1.07 12/31/2022    APTT 27.7 08/02/2019        Imaging:  No results found.    Plan:   S/p Roux-en-Y gastric bypass   - Voiding spontaneously      Neuro/Pain:  - MMPC; PO tylenol , dilaudid, robaxin     Cardiovascular:  - HDS     Pulmonary:  - SORA  - IS/OOB/ambulate     FEN/GI:  - F: medlocked  - E: replete as needed  - N: FLD   - Bowel regimen: none  - Nausea control: Zofran , phenergan, scopolamine  patch  - Simethicone  q4 PRN     GU/Renal:  - voiding spontaneously       Heme/ID:  - Hgb stable  - Afebrile, no leukocytosis  - Restart lovenox  DVT ppx     Endocrine:  - Euglycemic    Dispo: floor status    Please page SRZ [phone] with questions or concerns.    Note initiated by Tawni Blizzard, MS4    Corean Door, MD (PGY-1)    Attestation    I saw and evaluated the patient,  participating in the key portions of the service.  I reviewed the resident???s note.  I agree with the resident???s findings and plan.     [name], MD, MPH  06/11/2024  8:58 AM

## 2024-06-04 LAB — BASIC METABOLIC PANEL
ANION GAP: 11 mmol/L (ref 5–14)
ANION GAP: 13 mmol/L (ref 5–14)
BLOOD UREA NITROGEN: 5 mg/dL — ABNORMAL LOW (ref 9–23)
BLOOD UREA NITROGEN: <5 mg/dL — ABNORMAL LOW (ref 9–23)
BUN / CREAT RATIO: 8
CALCIUM: 9 mg/dL (ref 8.7–10.4)
CALCIUM: 8.9 mg/dL (ref 8.7–10.4)
CHLORIDE: 102 mmol/L (ref 98–107)
CHLORIDE: 104 mmol/L (ref 98–107)
CO2: 28 mmol/L (ref 20.0–31.0)
CO2: 19 mmol/L — ABNORMAL LOW (ref 20.0–31.0)
CREATININE: 0.64 mg/dL (ref 0.55–1.02)
CREATININE: 0.6 mg/dL (ref 0.55–1.02)
EGFR CKD-EPI (2021) FEMALE: >90 mL/min/1.73m2 (ref >=60)
EGFR CKD-EPI (2021) FEMALE: >90 mL/min/1.73m2 (ref >=60)
GLUCOSE RANDOM: 121 mg/dL (ref 70–179)
GLUCOSE RANDOM: 129 mg/dL (ref 70–179)
POTASSIUM: 3.7 mmol/L (ref 3.4–4.8)
POTASSIUM: 4.1 mmol/L (ref 3.4–4.8)
SODIUM: 141 mmol/L (ref 135–145)
SODIUM: 136 mmol/L (ref 135–145)

## 2024-06-04 LAB — URINALYSIS WITH MICROSCOPY
BACTERIA: NONE SEEN /HPF
BILIRUBIN UA: NEGATIVE
GLUCOSE UA: NEGATIVE
KETONES UA: 20 — AB
NITRITE UA: NEGATIVE
PH UA: 5.5 (ref 5.0–9.0)
RBC UA: 9 /HPF — ABNORMAL HIGH (ref <=4)
SPECIFIC GRAVITY UA: 1.022 (ref 1.003–1.030)
SQUAMOUS EPITHELIAL: <1 /HPF (ref 0–5)
UROBILINOGEN UA: <2
WBC UA: 8 /HPF — ABNORMAL HIGH (ref 0–5)

## 2024-06-04 LAB — CBC
HEMATOCRIT: 33.9 % — ABNORMAL LOW (ref 34.0–44.0)
HEMOGLOBIN: 11.2 g/dL — ABNORMAL LOW (ref 11.3–14.9)
MEAN CORPUSCULAR HEMOGLOBIN CONC: 33 g/dL (ref 32.0–36.0)
MEAN CORPUSCULAR HEMOGLOBIN: 28.2 pg (ref 25.9–32.4)
MEAN CORPUSCULAR VOLUME: 85.3 fL (ref 77.6–95.7)
MEAN PLATELET VOLUME: 8.6 fL (ref 6.8–10.7)
PLATELET COUNT: 164 10*9/L (ref 150–450)
RED BLOOD CELL COUNT: 3.97 10*12/L (ref 3.95–5.13)
RED CELL DISTRIBUTION WIDTH: 13.9 % (ref 12.2–15.2)
WBC ADJUSTED: 5.6 10*9/L (ref 3.6–11.2)

## 2024-06-04 LAB — PHOSPHORUS
PHOSPHORUS: 1.9 mg/dL — ABNORMAL LOW (ref 2.4–5.1)
PHOSPHORUS: 3.5 mg/dL (ref 2.4–5.1)

## 2024-06-04 LAB — MAGNESIUM
MAGNESIUM: 1.8 mg/dL (ref 1.6–2.6)
MAGNESIUM: 1.7 mg/dL (ref 1.6–2.6)

## 2024-06-04 MED ORDER — LAMOTRIGINE 25 MG CHEWABLE DISPERSIBLE TABLET
ORAL_TABLET | Freq: Every day | ORAL | 2 refills | 30.00000 days | Status: CP
Start: 2024-06-04 — End: 2024-06-03

## 2024-06-04 MED ADMIN — oxyCODONE (ROXICODONE) 5 mg/5 mL solution 5 mg: 5 mg | ORAL | @ 17:00:00 | Stop: 2024-06-04

## 2024-06-04 MED ADMIN — carvedilol (COREG) tablet 6.25 mg: 6.25 mg | ORAL

## 2024-06-04 MED ADMIN — potassium & sodium phosphates 250mg (PHOS-NAK/NEUTRA PHOS) packet 2 packet: 2 | ORAL | @ 13:00:00 | Stop: 2024-06-04

## 2024-06-04 MED ADMIN — methocarbamol (ROBAXIN) 1,000 mg in sodium chloride (NS) 0.9 % 50 mL IVPB: 1000 mg | INTRAVENOUS | @ 07:00:00 | Stop: 2024-06-04

## 2024-06-04 MED ADMIN — oxyCODONE (ROXICODONE) 5 mg/5 mL solution 5 mg: 5 mg | ORAL | @ 05:00:00 | Stop: 2024-06-04

## 2024-06-04 MED ADMIN — magnesium sulfate 2gm/50mL IVPB: 2 g | INTRAVENOUS | @ 15:00:00 | Stop: 2024-06-04

## 2024-06-04 MED ADMIN — simethicone (MYLICON) oral drops: 40 mg | ORAL | @ 09:00:00 | Stop: 2024-06-04

## 2024-06-04 MED ADMIN — oxyCODONE (ROXICODONE) 5 mg/5 mL solution 5 mg: 5 mg | ORAL | @ 13:00:00 | Stop: 2024-06-04

## 2024-06-04 MED ADMIN — simethicone (MYLICON) oral drops: 40 mg | ORAL | @ 05:00:00 | Stop: 2024-06-04

## 2024-06-04 MED ADMIN — methocarbamol (ROBAXIN) 1,000 mg in sodium chloride (NS) 0.9 % 50 mL IVPB: 1000 mg | INTRAVENOUS | @ 15:00:00 | Stop: 2024-06-04

## 2024-06-04 MED ADMIN — lamoTRIgine (LaMICtal) chewable tablet 25 mg: 25 mg | ORAL | @ 15:00:00 | Stop: 2024-06-04

## 2024-06-04 MED ADMIN — docusate (COLACE) oral liquid: 50 mg | ORAL

## 2024-06-04 MED ADMIN — pantoprazole (Protonix) injection 40 mg: 40 mg | INTRAVENOUS | @ 13:00:00 | Stop: 2024-06-04

## 2024-06-04 MED ADMIN — simethicone (MYLICON) oral drops: 40 mg | ORAL | @ 13:00:00 | Stop: 2024-06-04

## 2024-06-04 MED ADMIN — cetirizine (ZYRTEC) tablet 10 mg: 10 mg | ORAL | @ 13:00:00 | Stop: 2024-06-04

## 2024-06-04 MED ADMIN — simethicone (MYLICON) oral drops: 40 mg | ORAL

## 2024-06-04 MED ADMIN — lactated ringers bolus 500 mL: 500 mL | INTRAVENOUS | @ 13:00:00 | Stop: 2024-06-04

## 2024-06-04 MED ADMIN — sodium phosphate 30 mmol in dextrose 5 % 250 mL IVPB: 30 mmol | INTRAVENOUS | @ 11:00:00 | Stop: 2024-06-04

## 2024-06-04 MED ADMIN — magnesium sulfate 2gm/50mL IVPB: 2 g | INTRAVENOUS | @ 17:00:00 | Stop: 2024-06-04

## 2024-06-04 MED ADMIN — gabapentin (NEURONTIN) oral solution: 300 mg | ORAL | @ 02:00:00

## 2024-06-04 MED ADMIN — docusate (COLACE) oral liquid: 50 mg | ORAL | @ 13:00:00 | Stop: 2024-06-04

## 2024-06-04 MED ADMIN — potassium phosphate 30 mmol in sodium chloride (NS) 0.9 % 500 mL infusion: 30 mmol | INTRAVENOUS | @ 15:00:00 | Stop: 2024-06-04

## 2024-06-04 MED ADMIN — traZODone (DESYREL) tablet 200 mg: 200 mg | ORAL

## 2024-06-04 MED ADMIN — oxyCODONE (ROXICODONE) 5 mg/5 mL solution 5 mg: 5 mg | ORAL | @ 09:00:00 | Stop: 2024-06-04

## 2024-06-04 MED ADMIN — carvedilol (COREG) tablet 6.25 mg: 6.25 mg | ORAL | @ 13:00:00 | Stop: 2024-06-04

## 2024-06-04 MED ADMIN — potassium & sodium phosphates 250mg (PHOS-NAK/NEUTRA PHOS) packet 2 packet: 2 | ORAL | @ 11:00:00 | Stop: 2024-06-04

## 2024-06-04 MED ADMIN — ziprasidone (GEODON) capsule 80 mg: 80 mg | ORAL | @ 15:00:00 | Stop: 2024-06-04

## 2024-06-04 MED ADMIN — gabapentin (NEURONTIN) oral solution: 300 mg | ORAL | @ 19:00:00 | Stop: 2024-06-04

## 2024-06-04 MED ADMIN — gabapentin (NEURONTIN) oral solution: 300 mg | ORAL | @ 09:00:00 | Stop: 2024-06-04

## 2024-06-04 NOTE — Unmapped (Signed)
 Shift Summary  Phosphorus levels improved to normal, and magnesium  levels remained stable throughout the shift.   Urine output decreased, and urinalysis indicated abnormalities in leukocytes, protein, blood, RBC, and WBC counts.   The unplanned readmission score showed a slight increase, and gastrointestinal assessments were within defined limits.   Overall, the patient's condition showed some improvement in electrolyte balance, but pain management and urinary findings require attention.     Optimal Comfort and Wellbeing: Abdominal pain remained constant throughout the shift, with a slight increase in pain scale from 6 to 7, despite administration of oxyCODONE .     Readiness for Transition of Care: The unplanned readmission score slightly increased from 21.6 to 22.08, and bowel sounds were active with passing flatus noted.     Fluid and Electrolyte Balance: Phosphorus levels improved from low to normal, and magnesium  levels remained stable.     Effective Urinary Elimination: Urine output decreased over the shift, and urinalysis showed trace leukocytes and protein, with moderate blood and high RBC and WBC counts.

## 2024-06-04 NOTE — Unmapped (Signed)
 Shift Summary  Pain levels decreased slightly after administration of oxyCODONE .    Simethicone  was administered multiple times for flatulence.    Patient consistently refused anti-embolism devices and enoxaparin .    Non-skid footwear and locked bed wheels were maintained to prevent falls.    Overall, the patient experienced a slight improvement in comfort with no falls or bleeding incidents.     Optimal Comfort and Wellbeing: Pain levels decreased slightly from 8 to 7 after administration of oxyCODONE  at 12:46 AM and 4:45 AM, but the patient declined further interventions for pain management.     Absence of Fall and Fall-Related Injury: Non-skid footwear was consistently worn, and bed wheels remained locked throughout the shift, with regular toileting every two hours to prevent falls.     Absence of Bleeding: Patient refused anti-embolism devices throughout the shift, and there were no documented incidents of bleeding.

## 2024-06-04 NOTE — Unmapped (Signed)
 Upper GI Surgery Progress Note    Hospital Day: 4    Assessment:     Jenna Mosley is a 33 y.o. female with history of morbid obesity, [name] (HgbA1C 5.7), GERD, NASH, HTN, BPAD, [name], pulmonary hypertension, and HFpE admitted on 06/01/2024 for Roux-en-Y gastric bypass surgery.       Interval Events:   Px was transferred to floors last night. Overnight she complained of upper abdominal pain and was given a dose of Robaxin  and gabapentin . VSS, on RA. She denies n/v and has tolerated drinking water. She voided 3x and has been ambulating.     Objective:        Vital Signs:  BP 134/77  - Pulse 80  - Temp 37.3 ??C (99.1 ??F) (Oral)  - Resp 20  - SpO2 97%     Input/Output:  I/O last 3 completed shifts:  In: 5850.9 [P.O.:1160; I.V.:4372.9; IV Piggyback:318]  Out: 900 [Urine:900]  I/O this shift:  In: -   Out: 800 [Urine:800]      Physical Exam:    General: Cooperative, no distress, well appearing female  Head: Normocephalic, atraumatic  Pulmonary: Normal work of breathing, equal bilateral chest rise  Abdomen: Soft, non-distended, non-tender. Incisions c/d/I. Abdominal binder in place  Skin: No jaundice, rashes, erythema, or lesions. Skin color and texture normal.  Neurologic: Alert and interactive, no motor abnormalities noted.     Labs:  Lab Results   Component Value Date    WBC 5.6 06/04/2024    HGB 11.2 (L) 06/04/2024    HCT 33.9 (L) 06/04/2024    PLT 164 06/04/2024       Lab Results   Component Value Date    NA 141 06/04/2024    K 3.7 06/04/2024    CL 102 06/04/2024    CO2 28.0 06/04/2024    BUN 5 (L) 06/04/2024    CREATININE 0.64 06/04/2024    GLU 121 06/04/2024    CALCIUM  9.0 06/04/2024    MG 1.8 06/04/2024    PHOS 1.9 (L) 06/04/2024       Lab Results   Component Value Date    BILITOT 0.3 04/04/2024    BILIDIR 0.10 12/31/2022    PROT 7.7 04/04/2024    ALBUMIN 3.9 04/04/2024    ALT 14 04/04/2024    AST 26 04/04/2024    ALKPHOS 186 (H) 04/04/2024    GGT 35 02/11/2013       Lab Results   Component Value Date    PT 11.8 12/31/2022    INR 1.07 12/31/2022    APTT 27.7 08/02/2019        Imaging:  No results found.    Plan:   S/p Roux-en-Y gastric bypass      Neuro/Pain:  - MMPC; Scheduled gabapentin , PRN: tylenol  oral liquid, oxy 5mg  solution     Cardiovascular:  - HDS     Pulmonary:  - SORA  - IS/OOB/ambulate     FEN/GI:  - F: Gave 500 mL LR bolus d/t px being hemoconcentrated  - E: replete as needed - repleted Mg (1.8) and Phos (1.9)  - N: FLD   - Bowel regimen: none  - Nausea control: compazine , scopolamine  patch  - Simethicone  q4 PRN, Protonix      GU/Renal:  - voiding spontaneously       Heme/ID:  - Hgb stable  - Afebrile, no leukocytosis  - Restart lovenox  DVT ppx     Endocrine:  - Euglycemic  Dispo: floor status     Please page SRZ [phone] with questions or concerns.    Corean Door, MD (PGY-1)

## 2024-06-06 DIAGNOSIS — R319 Hematuria, unspecified: Principal | ICD-10-CM

## 2024-06-06 DIAGNOSIS — N390 Urinary tract infection, site not specified: Principal | ICD-10-CM

## 2024-06-06 DIAGNOSIS — C9210 Chronic myeloid leukemia, BCR/ABL-positive, not having achieved remission: Principal | ICD-10-CM

## 2024-06-06 MED ORDER — LAMOTRIGINE 25 MG CHEWABLE DISPERSIBLE TABLET
ORAL_TABLET | Freq: Every day | ORAL | 3 refills | 180.00000 days | Status: CP
Start: 2024-06-06 — End: 2024-06-06

## 2024-06-06 MED ORDER — NITROFURANTOIN 50 MG/5 ML ORAL SUSPENSION
Freq: Four times a day (QID) | ORAL | 0 refills | 5.00000 days | Status: CP
Start: 2024-06-06 — End: 2024-06-06

## 2024-06-06 MED ORDER — LAMOTRIGINE 25 MG TABLET
ORAL_TABLET | Freq: Every day | ORAL | 0 refills | 90.00000 days | Status: CP
Start: 2024-06-06 — End: 2024-06-06

## 2024-06-06 NOTE — Unmapped (Signed)
 Nurse notified patient Dr. Robinette sent the prescriptions to [facility].  Patient requesting prescriptions be sent to [facility] in [place], [place].  Nurse notified patient that Dr. Robinette resent the prescriptions to [facility] in [place] as requested.

## 2024-06-06 NOTE — Unmapped (Addendum)
 Patient's mom left a message.  Jenna Mosley is unable to receive her prescriptions for Macrobid  (UTI treatment) and Lamictal . The resident that sent in the prescriptions is not listed under their insurance.  Can Dr. Robinette send in the prescriptions for Jenna Mosley to her local pharmacy? The Lamictal  chewable tablets dosage is 25 mg twice a day per mom. Patient will need to be notified once the prescriptions have been sent to the pharmacy.    Nurse contacted patient.  Patient requesting medication be sent to [facility] in [place]

## 2024-06-06 NOTE — Unmapped (Signed)
 High priority, please respond within 2 business days.     I am reaching out to [employer] as part of the transitional case management team regarding her recent hospital discharge.  Patient reports: Patient is requesting if she may be to have a script sent to pharmacy on file for lamoTRIgine  25 MG tablet chewables from [facility] or Elaine Fonda Hutchinson, MD. Thank you.    Action item: make recommendations  Initial recommendations: Call PCP    Please advise for additional recommendations.    Next appointment with PCP: n/a     See chart for med list if needed    Lonni DELENA Scheuermann, RN

## 2024-06-06 NOTE — Unmapped (Signed)
 Patient states she is taking Lamictal  25 chewable tablets twice a day and the surgical team has sent the prescription to the pharmacy.  Patient is also, being treated for an UTI with Macrobid . Nurse scheduled patient a hospital follow up appointment with Dr. Robinette for Wednesday, July 23rd. Patient's physical is scheduled in August with Dr. Robinette.

## 2024-06-07 DIAGNOSIS — N3000 Acute cystitis without hematuria: Principal | ICD-10-CM

## 2024-06-07 DIAGNOSIS — Z794 Long term (current) use of insulin: Principal | ICD-10-CM

## 2024-06-07 DIAGNOSIS — R079 Chest pain, unspecified: Principal | ICD-10-CM

## 2024-06-07 DIAGNOSIS — E66813 Class 3 obesity: Principal | ICD-10-CM

## 2024-06-07 DIAGNOSIS — E1165 Type 2 diabetes mellitus with hyperglycemia: Principal | ICD-10-CM

## 2024-06-07 MED ORDER — NITROFURANTOIN MONOHYDRATE/MACROCRYSTALS 100 MG CAPSULE
ORAL_CAPSULE | Freq: Two times a day (BID) | ORAL | 0 refills | 7.00000 days | Status: CP
Start: 2024-06-07 — End: 2024-06-14

## 2024-06-07 NOTE — Unmapped (Signed)
 Dexcom G7 uploaded to chart  PP: Approx 2 PM. Pton a liquid diet.  BS: 100 mg/dL  J8R 4.2% as of 4/0/74    S/P Gastric Bypass 06/01/24

## 2024-06-07 NOTE — Unmapped (Unsigned)
 Assess psych provider    Gastric bypass surgery    Diabetes control

## 2024-06-07 NOTE — Unmapped (Signed)
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 tablet, Rfl:  1    [Paused] dapagliflozin  propanediol (FARXIGA ) 10 mg Tab tablet, TAKE ONE TABLET BY MOUTH EVERY MORNING, Disp: 90 tablet, Rfl: 0    divalproex  (DEPAKOTE  SPRINKLE) 125 mg capsule, Take 4 capsules (500 mg total) by mouth nightly., Disp: 120 capsule, Rfl: 2    enoxaparin  (LOVENOX ) 40 mg/0.4 mL Syrg, Inject 0.4 mL (40 mg total) under the skin every twelve (12) hours., Disp: 24 mL, Rfl: 0    [Paused] ferrous sulfate  325 (65 FE) MG tablet, Take 1 tablet (325 mg total) by mouth in the morning., Disp: , Rfl:     fluticasone  propionate (FLONASE ) 50 mcg/actuation nasal spray, 2 sprays into each nostril daily., Disp: 16 g, Rfl: 0    gabapentin  (NEURONTIN ) 250 mg/5 mL oral solution, Take 6 mL (300 mg total) by mouth every eight (8) hours for 5 days., Disp: 90 mL, Rfl: 0    hydrOXYzine  (VISTARIL ) 50 MG capsule, Take 2 capsules (100 mg total) by mouth nightly. And takes PRN, Disp: , Rfl:     [Paused] insulin  regular hum U-500 conc (HUMULIN  R U-500, CONC, KWIKPEN) 500 unit/mL (3 mL) CONCENTRATED injection, Inject 150 Units under the skin in the morning., Disp: 30 mL, Rfl: 3    lamoTRIgine  (LAMICTAL ) 25 MG TChD, Take 1 tablet (25 mg total) by mouth daily., Disp: 180 tablet, Rfl: 3    lancets (ACCU-CHEK SOFTCLIX LANCETS) Misc, Check sugar three times per day before meals for 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 Every 3 months, Disp: , Rfl:     methocarbamol  (ROBAXIN ) 500 MG tablet, Take 1 tablet (500 mg total) by mouth Three (3) times a day as needed., Disp: 30 tablet, Rfl: 0    nitrofurantoin  50 mg/5 mL Susp, Take 50 mg by mouth every six (6) hours for 5 days., Disp: 140 mL, Rfl: 0    norethindrone  (AYGESTIN ) 5 mg tablet, TAKE ONE TABLET EVERY MORNING, Disp: 30 tablet, Rfl: 11    omeprazole  (PRILOSEC) 20 MG capsule, Take 1 capsule (20 mg total) by mouth daily. Open capsule, sprinkle granules in liquid or mix with greek yogurt to take, Disp: 180 capsule, Rfl: 0    ondansetron  (ZOFRAN -ODT) 4 MG disintegrating tablet, Dissolve 1 tablet (4 mg total) in the mouth every six (6) hours as needed for nausea., Disp: 30 tablet, Rfl: 1    oxyCODONE  (ROXICODONE ) 5 mg/5 mL solution, Take 5 mL (5 mg total) by mouth every four (4) hours as needed, Disp: 100 mL, Rfl: 0    [Paused] pregabalin  (LYRICA ) 75 MG capsule, TAKE 1 CAPSULE BY MOUTH IN THE MORNING AND 2 CAPSULES BY MOUTH IN THE EVENING., Disp: 270 capsule, Rfl: 1    simethicone  (MYLICON) 40 mg/0.6 mL drops, Take 0.6 mL (40 mg total) by mouth every four (4) hours as needed., Disp: 30 mL, Rfl: 0    TECHLITE PEN NEEDLE 32 gauge x 1/4 (6 mm) Ndle, USE 3 TIMES DAILY AS DIRECTED, Disp: 300 each, Rfl: 4    [Paused] torsemide  (DEMADEX ) 20 MG tablet, TAKE 4 TABLETS BY MOUTH ONCE DAILY, Disp: 120 tablet, Rfl: 6    traZODone  (DESYREL ) 100 MG tablet, TAKE 2 TABLETS BY MOUTH AT BEDTIME, Disp: 180 tablet, Rfl: 3    ziprasidone  (GEODON ) 80 MG capsule, Take 1 capsule (80 mg total) by mouth in the morning and 1 capsule (80 mg total) in the evening. Take with meals., Disp: ,  Rfl:        Objective:     Physical exam:  Vitals:    06/07/24 1530   BP: 90/63   Pulse: 72       Wt Readings from Last 3 Encounters:   06/07/24 (!) 137.1 kg (302 lb 3.2 oz)   05/24/24 (!) 141.8 kg (312 lb 11.2 oz)   05/17/24 (!) 144.9 kg (319 lb 6.4 oz)     BMI Readings from Last 3 Encounters:   06/07/24 40.97 kg/m??   05/24/24 42.40 kg/m??   05/17/24 43.32 kg/m??      GEN: appears well, in NAD  HEENT: sclerae anicteric  NECK:  no visible neck mass or deformity  CHEST: normal breathing chest movements  NEURO: Aox3, following commands  PSYCH: normal affect.  SKIN: no visible rash         Labs reviewed:  Lab Results   Component Value Date    A1C 5.7 (H) 03/25/2024       Lab Results   Component Value Date    NA 136 06/04/2024    K 4.1 06/04/2024    CL 104 06/04/2024    CO2 19.0 (L) 06/04/2024    BUN <5 (L) 06/04/2024    CREATININE 0.60 06/04/2024    GFR >= 60 02/11/2013    GLU 129 06/04/2024    CALCIUM  8.9 06/04/2024    ALBUMIN 3.9 04/04/2024    PHOS 3.5 06/04/2024       Lab Results   Component Value Date    TSH 1.215 03/25/2024

## 2024-06-07 NOTE — Unmapped (Signed)
 Continue to monitor glucose. If above 180 persistently, please let me know.

## 2024-06-07 NOTE — Unmapped (Signed)
 Pt here stating she had Gastric bypass last week, comes in tonight with uncontrolled n/v and chest pain. NO fever

## 2024-06-08 ENCOUNTER — Inpatient Hospital Stay: Admit: 2024-06-08 | Discharge: 2024-06-09 | Payer: Medicaid (Managed Care)

## 2024-06-08 ENCOUNTER — Ambulatory Visit: Admit: 2024-06-08 | Discharge: 2024-06-09 | Payer: Medicaid (Managed Care)

## 2024-06-08 LAB — CBC W/ AUTO DIFF
BASOPHILS ABSOLUTE COUNT: 0 10*9/L (ref 0.0–0.1)
BASOPHILS RELATIVE PERCENT: 0.3 %
EOSINOPHILS ABSOLUTE COUNT: 0 10*9/L (ref 0.0–0.5)
EOSINOPHILS RELATIVE PERCENT: 0.3 %
HEMATOCRIT: 38.9 % (ref 34.0–44.0)
HEMOGLOBIN: 12.9 g/dL (ref 11.3–14.9)
LYMPHOCYTES ABSOLUTE COUNT: 1.4 10*9/L (ref 1.1–3.6)
LYMPHOCYTES RELATIVE PERCENT: 12.3 %
MEAN CORPUSCULAR HEMOGLOBIN CONC: 33.2 g/dL (ref 32.0–36.0)
MEAN CORPUSCULAR HEMOGLOBIN: 28.3 pg (ref 25.9–32.4)
MEAN CORPUSCULAR VOLUME: 85.2 fL (ref 77.6–95.7)
MEAN PLATELET VOLUME: 8.7 fL (ref 6.8–10.7)
MONOCYTES ABSOLUTE COUNT: 0.6 10*9/L (ref 0.3–0.8)
MONOCYTES RELATIVE PERCENT: 5.6 %
NEUTROPHILS ABSOLUTE COUNT: 9 10*9/L — ABNORMAL HIGH (ref 1.8–7.8)
NEUTROPHILS RELATIVE PERCENT: 81.5 %
PLATELET COUNT: 294 10*9/L (ref 150–450)
RED BLOOD CELL COUNT: 4.56 10*12/L (ref 3.95–5.13)
RED CELL DISTRIBUTION WIDTH: 14.2 % (ref 12.2–15.2)
WBC ADJUSTED: 11.1 10*9/L (ref 3.6–11.2)

## 2024-06-08 LAB — COMPREHENSIVE METABOLIC PANEL
ALBUMIN: 3.4 g/dL (ref 3.4–5.0)
ALKALINE PHOSPHATASE: 175 U/L — ABNORMAL HIGH (ref 46–116)
ALT (SGPT): <7 U/L — ABNORMAL LOW (ref 10–49)
ANION GAP: 18 mmol/L — ABNORMAL HIGH (ref 5–14)
AST (SGOT): 15 U/L (ref <=34)
BILIRUBIN TOTAL: 0.5 mg/dL (ref 0.3–1.2)
BLOOD UREA NITROGEN: 10 mg/dL (ref 9–23)
BUN / CREAT RATIO: 14
CALCIUM: 10.2 mg/dL (ref 8.7–10.4)
CHLORIDE: 101 mmol/L (ref 98–107)
CO2: 23 mmol/L (ref 20.0–31.0)
CREATININE: 0.74 mg/dL (ref 0.55–1.02)
EGFR CKD-EPI (2021) FEMALE: >90 mL/min/1.73m2 (ref >=60)
GLUCOSE RANDOM: 181 mg/dL — ABNORMAL HIGH (ref 70–179)
POTASSIUM: 3.7 mmol/L (ref 3.4–4.8)
PROTEIN TOTAL: 7.5 g/dL (ref 5.7–8.2)
SODIUM: 142 mmol/L (ref 135–145)

## 2024-06-08 LAB — URINALYSIS WITH MICROSCOPY WITH CULTURE REFLEX PERFORMABLE
GLUCOSE UA: NEGATIVE
HYALINE CASTS: 11 /LPF — ABNORMAL HIGH (ref 0–1)
KETONES UA: 80 — AB
NITRITE UA: NEGATIVE
PH UA: 5.5 (ref 5.0–9.0)
PROTEIN UA: 30 — AB
RBC UA: 21 /HPF — ABNORMAL HIGH (ref <=4)
SPECIFIC GRAVITY UA: 1.025 (ref 1.003–1.030)
SQUAMOUS EPITHELIAL: 11 /HPF — ABNORMAL HIGH (ref 0–5)
TRANSITIONAL EPITHELIAL: 2 /HPF (ref 0–2)
UROBILINOGEN UA: 2 — AB
WBC UA: 146 /HPF — ABNORMAL HIGH (ref 0–5)

## 2024-06-08 LAB — BETA HYDROXYBUTYRATE: BETA-HYDROXYBUTYRATE: 3.84 mmol/L — ABNORMAL HIGH (ref 0.02–0.27)

## 2024-06-08 LAB — LIPASE: LIPASE: 22 U/L (ref 12–53)

## 2024-06-08 LAB — PRO-BNP: PRO-BNP: 520 pg/mL — ABNORMAL HIGH (ref <=300.0)

## 2024-06-08 LAB — MAGNESIUM: MAGNESIUM: 1.5 mg/dL — ABNORMAL LOW (ref 1.6–2.6)

## 2024-06-08 LAB — HIGH SENSITIVITY TROPONIN I - SINGLE: HIGH SENSITIVITY TROPONIN I: <3 ng/L (ref <=34)

## 2024-06-08 LAB — PREGNANCY, URINE: PREGNANCY TEST URINE: NEGATIVE

## 2024-06-08 MED ADMIN — lactated ringers bolus 500 mL: 500 mL | INTRAVENOUS | @ 19:00:00 | Stop: 2024-06-08

## 2024-06-08 MED ADMIN — famotidine (PF) (PEPCID) injection 20 mg: 20 mg | INTRAVENOUS | @ 15:00:00 | Stop: 2024-06-08

## 2024-06-08 MED ADMIN — iohexol (OMNIPAQUE) 240 mg iodine/mL oral solution 50 mL: 50 mL | ORAL | @ 08:00:00 | Stop: 2024-06-08

## 2024-06-08 MED ADMIN — morphine 4 mg/mL injection 4 mg: 4 mg | INTRAVENOUS | @ 08:00:00 | Stop: 2024-06-08

## 2024-06-08 MED ADMIN — iohexol (OMNIPAQUE) 350 mg iodine/mL solution 100 mL: 100 mL | INTRAVENOUS | @ 10:00:00 | Stop: 2024-06-08

## 2024-06-08 MED ADMIN — pantoprazole (Protonix) injection 40 mg: 40 mg | INTRAVENOUS | @ 12:00:00 | Stop: 2024-06-08

## 2024-06-08 MED ADMIN — oxyCODONE (ROXICODONE) 5 mg/5 mL solution 5 mg: 5 mg | ORAL | @ 15:00:00 | Stop: 2024-06-08

## 2024-06-08 MED ADMIN — magnesium sulfate 2gm/50mL IVPB: 2 g | INTRAVENOUS | @ 21:00:00 | Stop: 2024-06-08

## 2024-06-08 MED ADMIN — pantoprazole (Protonix) injection 40 mg: 40 mg | INTRAVENOUS | @ 19:00:00

## 2024-06-08 MED ADMIN — multivitamin, therapeutic with minerals oral liquid: 15 mL | ORAL | @ 21:00:00

## 2024-06-08 MED ADMIN — ondansetron (ZOFRAN) injection 4 mg: 4 mg | INTRAVENOUS | @ 15:00:00 | Stop: 2024-06-08

## 2024-06-08 MED ADMIN — ondansetron (ZOFRAN) injection 4 mg: 4 mg | INTRAVENOUS | @ 08:00:00 | Stop: 2024-06-08

## 2024-06-08 MED ADMIN — cefTRIAXone (ROCEPHIN) 1 g in sodium chloride 0.9 % (NS) 100 mL IVPB-MBP: 1 g | INTRAVENOUS | @ 12:00:00 | Stop: 2024-06-08

## 2024-06-08 MED ADMIN — magnesium sulfate 2gm/50mL IVPB: 2 g | INTRAVENOUS | @ 19:00:00 | Stop: 2024-06-08

## 2024-06-08 MED ADMIN — magnesium hydroxide (MILK OF MAGNESIA) oral suspension: 30 mL | ORAL | @ 21:00:00

## 2024-06-08 MED ADMIN — thiamine (B-1) injection 200 mg: 200 mg | INTRAVENOUS | @ 19:00:00 | Stop: 2024-06-11

## 2024-06-08 MED ADMIN — scopolamine (TRANSDERM-SCOP) 1 mg over 3 days topical patch 1 mg: 1 | TOPICAL | @ 19:00:00

## 2024-06-08 MED ADMIN — gabapentin (NEURONTIN) oral solution: 300 mg | ORAL | @ 21:00:00
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
## 2024-06-08 NOTE — Unmapped (Addendum)
 Jenna Mosley 33 y.o. with morbid obesity, [name] (HgbA1C 5.7), GERD, NASH, HTN, BPAD, [name], pulmonary hypertension, and HFpEF s/p laparoscopic RYGB on 06/01/24 who presented to the ED on 7/23 with N/V, abdominal pain and chest pain localized to the middle of her chest w/o radiation. EKG and trop negative, BNP elevated to 520, CT abdomen pelvis with IV and PO contrast showed normal post-surgical changes and no signs of a leak or bowel obstruction. She was recently diagnosed with a UTI and took 2 doses of her previously prescribed Macrobid  prior to presenting to the ED, one of those doses was removed from the coated capsule. She was seen by the surgical team and admitted for observation.    Cardiology was called and advised no further workup for the mildly elevated proBNP. Endocrinology advised no necessary medication changes given her glucose levels, agreed to continue plan as previously discussed at her outpatient appointment on 06/07/2024.    She tolerated her bariatric full liquid diet through out her hospital stay. Her abdominal pain improved, requiring only PO medication at discharge. Her nausea resolved and she was no longer having burning chest pain when drinking prior to discharge.     She did not have a bowel movement and strongly desired to go home to try to have a bowel movement. She agreed to increasing her bowel regimen with BID liquid colace, BID milk of magnesia, suppositories and a bottle of magnesium  citrate if she has not had a bowel movement with in two days.    She was examined by the surgical team on the day of discharge and was deemed suitable for discharge home. She will be discharged on 06/09/2024 in good condition. She has a follow up appointment in clinic scheduled for August 8th at 12pm with Dr. Annitta Georgi.

## 2024-06-08 NOTE — Unmapped (Signed)
 [facility]   Care Management     Patient is a 33 y.o. admitted on 06/08/2024 for Epigastric pain [R10.13]. Per review of the medical record and discussion with the treating team, the patient does not meet indicators for a full assessment at this time. CM will continue to assess for discharge needs and follow up, as indicated.    Earle LITTIE Budge, RN June 08, 2024 3:58 PM      Food Insecurity: No Food Insecurity (06/02/2024)    Hunger Vital Sign     Worried About Running Out of Food in the Last Year: Never true     Ran Out of Food in the Last Year: Never true      Financial Resource Strain: Low Risk  (06/02/2024)    Overall Financial Resource Strain (CARDIA)     Difficulty of Paying Living Expenses: Not very hard     Housing: Low Risk  (06/02/2024)     Transportation Needs: No Transportation Needs (06/02/2024)    PRAPARE - Therapist, art (Medical): No     Lack of Transportation (Non-Medical): No

## 2024-06-08 NOTE — Unmapped (Shared)
 Upper GI Surgery Progress Note    Service Date: 06/09/2024  Admit Date: 06/08/2024, Hospital Day: 2  Hospital Service: [place] [facility] [place])  Attending: Annitta Herminio Georgi, MD    Assessment     Jenna Mosley is a 33 y.o. 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 CTAP in the ED demonstrated inflammation surrounding the GJ anastomosis and distal jejunum.  Of note, patient was recently diagnosed with a UTI for which she took 1 day of Macrobid  and received a dose of Rocephin  in the ED.     Interval Events   No acute events overnight. Patient offered suppository which was refused. PIV placed by VAT team. The patient notes chest pain and abdominal pain is much better. ***    Plan     Neuro: pain well-controlled.   - scheduled gabapentin  300 mg Q8hr, PRN Tylenol  100mg  q6h and oxycodone  5mg  Q4hr    HEENT:  Hx of allergies  -continue home Flonase      Psych:  Hx of BPD, Anxiety  -continue home Depakote    -continue home lamotrigene    CV: HDS ***  Hx of HFpEF with elevated proBNP on admission  - cardiology consulted, awaiting recs  - AM pro-BNP ***    Pulm: stable on ***  Hx of pulmonary hypertension  - IS     FEN/GI:  F: {Fluids:121212}  E: No active electrolyte abnormalities  N:Supplement Adult; Ensure Max Protein (High Protein/Low Cal/Low Carb) ([place] Only); # of Products PER Serving: 1 Mid-morning  Nutrition Therapy Bariatric; Bariatric Full Liquid; No Carbonation, No Straw.  BR: Milk of magnesia, PRN simethicone  drops   Nausea: scheduled Zofran , PRN scopolamine  patch   -IV Protonix  40 mg BID   -daily multivitamin      R/GU: Voiding spontaneously.   -      Heme/ID:   - H/H stable***  UTI  -s/p Rocephin  06/08/24  -Ucx pending     Endo:   Hx of [name]  -endocrinology consulted, awaiting recs  -correctional insulin  ***    Prophylaxis:  - DVT: enoxaparin . SCDs.     Disposition: Floor status.   -PT/OT recs: pending eval  -Home health needs: pending eval      Objective     Vitals:   Temp:  [36.6 ??C (97.8 ??F)-37 ??C (98.6 ??F)] 36.8 ??C (98.2 ??F)  Pulse:  [55-68] 55  SpO2 Pulse:  [61] 61  Resp:  [16-18] 18  BP: (106-154)/(58-86) 126/60  MAP (mmHg):  [76-102] 77  SpO2:  [94 %-98 %] 96 %    Intake/Output:     Intake/Output Summary (Last 24 hours) at 06/09/2024 0729  Last data filed at 06/08/2024 1717  Gross per 24 hour   Intake 315 ml   Output --   Net 315 ml     Physical Exam: ***  -General:  Appropriate, comfortable and in no apparent distress.   -Neurological: Moves all 4 extremities spontaneously.   -Cardiovascular: HDS  -Pulmonary: Normal work of breathing.   -Abdomen: Soft, non-tender, non-distended. No rebound or guarding. Incisions c/d/I with dermabond in place ***  -Extremities: Warm, well perfused.     Recent Imaging:  No results found.      Note initiated by Tawni Blizzard, MS4  ***

## 2024-06-08 NOTE — Unmapped (Signed)
 VENOUS ACCESS ULTRASOUND PROCEDURE NOTE    Indications:   Poor venous access.    The Venous Access Team has assessed this patient for the placement of a PIV. Ultrasound guidance was necessary to obtain access.     Procedure Details:  Identity of the patient was confirmed via name, medical record number and date of birth. The availability of the correct equipment was verified.    The vein was identified for ultrasound catheter insertion.  Field was prepared with necessary supplies and equipment.  Probe cover and sterile gel utilized.  Insertion site was prepped with chlorhexidine solution and allowed to dry.  The catheter extension was primed with normal saline. A(n) 22 gauge 1.75 catheter was placed in the L Forearm with 1attempt(s). See LDA for additional details.    Catheter aspirated, 5 mL blood return present. The catheter was then flushed with 10 mL of normal saline. Insertion site cleansed, and dressing applied per manufacturer guidelines. The catheter was inserted with difficulty due to poor vasculature by Tilda Burrow, RN.     Primary RN was notified.     Thank you,     Tilda Burrow, RN Venous Access Team   [phone]     Workup / Procedure Time:  30 minutes    See images below:

## 2024-06-09 DIAGNOSIS — C9210 Chronic myeloid leukemia, BCR/ABL-positive, not having achieved remission: Principal | ICD-10-CM

## 2024-06-09 LAB — BASIC METABOLIC PANEL
ANION GAP: 12 mmol/L (ref 5–14)
BLOOD UREA NITROGEN: 5 mg/dL — ABNORMAL LOW (ref 9–23)
BUN / CREAT RATIO: 9
CALCIUM: 9.2 mg/dL (ref 8.7–10.4)
CHLORIDE: 106 mmol/L (ref 98–107)
CO2: 28 mmol/L (ref 20.0–31.0)
CREATININE: 0.58 mg/dL (ref 0.55–1.02)
EGFR CKD-EPI (2021) FEMALE: >90 mL/min/1.73m2 (ref >=60)
GLUCOSE RANDOM: 98 mg/dL (ref 70–99)
POTASSIUM: 3.9 mmol/L (ref 3.4–4.8)
SODIUM: 146 mmol/L — ABNORMAL HIGH (ref 135–145)

## 2024-06-09 LAB — CBC
HEMATOCRIT: 31.6 % — ABNORMAL LOW (ref 34.0–44.0)
HEMOGLOBIN: 11 g/dL — ABNORMAL LOW (ref 11.3–14.9)
MEAN CORPUSCULAR HEMOGLOBIN CONC: 34.8 g/dL (ref 32.0–36.0)
MEAN CORPUSCULAR HEMOGLOBIN: 29.4 pg (ref 25.9–32.4)
MEAN CORPUSCULAR VOLUME: 84.4 fL (ref 77.6–95.7)
MEAN PLATELET VOLUME: 8.3 fL (ref 6.8–10.7)
PLATELET COUNT: 233 10*9/L (ref 150–450)
RED BLOOD CELL COUNT: 3.75 10*12/L — ABNORMAL LOW (ref 3.95–5.13)
RED CELL DISTRIBUTION WIDTH: 14.1 % (ref 12.2–15.2)
WBC ADJUSTED: 4.9 10*9/L (ref 3.6–11.2)

## 2024-06-09 LAB — PHOSPHORUS: PHOSPHORUS: 4.1 mg/dL (ref 2.4–5.1)

## 2024-06-09 LAB — MAGNESIUM: MAGNESIUM: 2.1 mg/dL (ref 1.6–2.6)

## 2024-06-09 LAB — PRO-BNP: PRO-BNP: 517 pg/mL — ABNORMAL HIGH (ref <=300.0)

## 2024-06-09 MED ORDER — MAGNESIUM CITRATE ORAL SOLUTION
Freq: Once | ORAL | 0 refills | 1.00000 days | Status: CP
Start: 2024-06-09 — End: 2024-06-10
  Filled 2024-06-09: qty 296, 1d supply, fill #0

## 2024-06-09 MED ORDER — BISACODYL 10 MG RECTAL SUPPOSITORY
Freq: Two times a day (BID) | RECTAL | 0 refills | 6.00000 days | Status: CP | PRN
Start: 2024-06-09 — End: ?
  Filled 2024-06-09: qty 12, 6d supply, fill #0

## 2024-06-09 MED ORDER — MAGNESIUM HYDROXIDE 400 MG/5 ML ORAL SUSPENSION
Freq: Two times a day (BID) | ORAL | 2 refills | 32.00000 days | Status: CP
Start: 2024-06-09 — End: 2024-09-07
  Filled 2024-06-09: 24d supply, fill #0

## 2024-06-09 MED ORDER — OXYCODONE 5 MG/5 ML ORAL SOLUTION
ORAL | 0 refills | 2.00000 days | Status: CP | PRN
Start: 2024-06-09 — End: ?
  Filled 2024-06-09: qty 60, 2d supply, fill #0

## 2024-06-09 MED ORDER — BOSUTINIB 500 MG TABLET
ORAL_TABLET | ORAL | 2 refills | 0.00000 days | Status: CP
Start: 2024-06-09 — End: ?
  Filled 2024-07-05: qty 30, 30d supply, fill #0

## 2024-06-09 MED ORDER — DOCUSATE SODIUM 50 MG/5 ML ORAL LIQUID
Freq: Two times a day (BID) | ORAL | 1 refills | 24.00000 days | Status: CP
Start: 2024-06-09 — End: 2024-07-27
  Filled 2024-06-09: qty 473, 24d supply, fill #0

## 2024-06-09 MED ADMIN — magnesium hydroxide (MILK OF MAGNESIA) oral suspension: 30 mL | ORAL | @ 14:00:00 | Stop: 2024-06-09

## 2024-06-09 MED ADMIN — lamoTRIgine (LaMICtal) chewable tablet 25 mg: 25 mg | ORAL | @ 02:00:00

## 2024-06-09 MED ADMIN — divalproex (DEPAKOTE SPRINKLE) capsule 500 mg: 500 mg | ORAL | @ 02:00:00

## 2024-06-09 MED ADMIN — pantoprazole (Protonix) injection 40 mg: 40 mg | INTRAVENOUS | @ 14:00:00 | Stop: 2024-06-09
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
## 2024-06-09 NOTE — Unmapped (Signed)
 Jenna Mosley has been contacted in regards to their refill of Bosulif . At this time, they have declined refill due to medication being on hold. Refill assessment call date has been updated per the patient's request.  Patient doesn't know how many tablets she has on hand.  Discussed she needs to contact [facility] to set up a delivery if she resumes and has less than 7 day supply on hand.    Rodger Giangregorio, PharmD  [facility]

## 2024-06-09 NOTE — Unmapped (Signed)
 Discharge Summary    Admit date: 06/08/2024    Discharge date and time: June 09, 2024 10:45 AM    Discharge to:  Home    Discharge Service: Cleopatra Berke Upper [phone])    Discharge Attending Physician: Annitta Herminio Georgi, MD    Discharge  Diagnoses: Constipation, Dehdyration    Secondary Diagnosis: Principal Problem:    Constipation (POA: Yes)  Resolved Problems:    * No resolved hospital problems. *      OR Procedures:  None     Ancillary Procedures: no procedures    Discharge Day Services:     Subjective   Jenna Mosley reports her abdominal pain is improving and her chest pain has resolved. She reports extreme anxiety with the idea of having a BM in the hospital, or any non-home setting; this anxiety is long stranding and pre-dates surgery. She reports tolerating her CLD and urinating well. She denies dysuria, fevers, or chills. She has requested discharge home and is insistent she will be more successful in her PO intake and having a BM in her home environment. She verbalized an understanding of the recommended bowel regimen on discharge, as well as return precautions.    Objective   Patient Vitals for the past 8 hrs:   BP Temp Temp src Pulse Resp SpO2   06/09/24 0829 120/70 36.8 ??C (98.2 ??F) Oral 56 20 98 %   06/09/24 0349 126/60 36.8 ??C (98.2 ??F) Oral 55 18 96 %     No intake/output data recorded.    General Appearance:    No acute distress  Lungs:                 EWOB on RA  Heart:                            Regular rate and rhythm  Abdomen:                 Soft, not-distended. Tender to deep palpation in RLQ. Incisions healing well without erythema, induration, or exudate.   Extremities:               Warm and well perfused. No LEE.    Hospital Course:  Jenna Mosley 33 y.o. with morbid obesity, [name] (HgbA1C 5.7), GERD, NASH, HTN, BPAD, [name], pulmonary hypertension, and HFpEF s/p laparoscopic RYGB on 06/01/24 who presented to the ED on 7/23 with N/V, abdominal pain and chest pain localized to the middle of her chest w/o radiation. EKG and trop negative, BNP elevated to 520, CT abdomen pelvis with IV and PO contrast showed normal post-surgical changes and no signs of a leak or bowel obstruction. She was recently diagnosed with a UTI and took 2 doses of her previously prescribed Macrobid  prior to presenting to the ED, one of those doses was removed from the coated capsule. She was seen by the surgical team and admitted for observation.    Cardiology was called and advised no further workup for the mildly elevated proBNP. Endocrinology advised no necessary medication changes given her glucose levels, agreed to continue plan as previously discussed at her outpatient appointment on 06/07/2024.    She tolerated her bariatric full liquid diet through out her hospital stay. Her abdominal pain improved, requiring only PO medication at discharge. Her nausea resolved and she was no longer having burning chest pain when drinking prior to discharge.     She did not have a bowel  movement and strongly desired to go home to try to have a bowel movement. She agreed to increasing her bowel regimen with BID liquid colace, BID milk of magnesia, suppositories and a bottle of magnesium  citrate if she has not had a bowel movement with in two days.    She was examined by the surgical team on the day of discharge and was deemed suitable for discharge home. She will be discharged on 06/09/2024 in good condition. She has a follow up appointment in clinic scheduled for August 8th at 12pm with Dr. Annitta Georgi.      Condition at Discharge: Improved  Discharge Medications:      Medication List      PAUSE taking these medications     cyclobenzaprine  5 MG tablet; Wait to take this until your doctor or   other care provider tells you to start again.; May resume when no longer   taking robaxin .; Commonly known as: FLEXERIL ; Take 1 tablet (5 mg total)   by mouth Three (3) times a day as needed (lower back pain).   FARXIGA  10 mg Tab tablet; Wait to take this until your doctor or other   care provider tells you to start again.; Generic drug: dapagliflozin    propanediol; TAKE ONE TABLET BY MOUTH EVERY MORNING   ferrous sulfate  325 (65 FE) MG tablet; Wait to take this until your   doctor or other care provider tells you to start again.   HumuLIN  R U-500 (Conc) Kwikpen 500 unit/mL (3 mL) CONCENTRATED   injection; Wait to take this until your doctor or other care provider   tells you to start again.; Generic drug: insulin  regular hum U-500 conc;   Inject 150 Units under the skin in the morning.   norethindrone  5 mg tablet; Wait to take this until your doctor or other   care provider tells you to start again.; Commonly known as: AYGESTIN ; TAKE   ONE TABLET EVERY MORNING   pregabalin  75 MG capsule; Wait to take this until: June 30, 2024;   Commonly known as: LYRICA ; TAKE 1 CAPSULE BY MOUTH IN THE MORNING AND 2   CAPSULES BY MOUTH IN THE EVENING.   torsemide  20 MG tablet; Wait to take this until your doctor or other   care provider tells you to start again.; Follow up with your PCP prior to   restarting; Commonly known as: DEMADEX ; TAKE 4 TABLETS BY MOUTH ONCE DAILY     START taking these medications     bisacodyl  10 mg suppository; Commonly known as: DULCOLAX; Insert 1   suppository (10 mg total) into the rectum two (2) times a day as needed   for up to 12 doses.   docusate 50 mg/5 mL liquid; Commonly known as: COLACE; Take 10 mL (100   mg total) by mouth two (2) times a day for 95 doses.   magnesium  citrate solution; Take 296 mL by mouth once for 1 dose. If no   BM in 2 days, take 1/2 bottle in the morning and 1/2 bottle in the   evening. Stay hydrated.   magnesium  hydroxide 400 mg/5 mL Susp; Commonly known as: MILK OF   MAGNESIA; Take 30 mL by mouth two (2) times a day.     CONTINUE taking these medications     ACCU-CHEK GUIDE TEST STRIPS Strp; Generic drug: blood sugar diagnostic;   Check sugars before meals three times for insulin  dependent type two   diabetes. E11.65 albuterol  90 mcg/actuation inhaler; Commonly  known as: PROVENTIL    HFA;VENTOLIN  HFA; Inhale 2 puffs every six (6) hours as needed.   alcohol  swabs  Padm; Commonly known as: ALCOHOL  PADS; Apply 1 Swab   topically Three (3) times a day before meals.   BOSULIF  500 mg Tab; Generic drug: bosutinib; Take 1 tablet (500 mg   total) by mouth once daily. Administer with food. Swallow tablet whole; do   not cut, crush, break, or chew.   calcium  carbonate-vitamin D3 600 mg-20 mcg (800 unit) Tab   carvedilol  6.25 MG tablet; Commonly known as: COREG ; Take 1 tablet (6.25   mg total) by mouth two (2) times a day.   cetirizine  10 MG tablet; Commonly known as: ZYRTEC ; TAKE 1 TABLET BY   MOUTH IN THE MORNING.   Children's acetaminophen  160 mg/5 mL solution; Generic drug:   acetaminophen ; Take 31.2 mL (1,000 mg total) by mouth every six (6) hours   as needed.   clonazePAM  0.5 MG tablet; Commonly known as: KlonoPIN    cyanocobalamin  (vitamin B-12) 1,000 mcg/mL Kit; Inject 1,000 mcg as   directed every thirty (30) days.   DEXCOM G7 [employer]; Generic drug: blood-glucose sensor; Change sensor   every 10 days.   divalproex  125 mg capsule; Commonly known as: DEPAKOTE  SPRINKLE; Take 4   capsules (500 mg total) by mouth nightly.   enoxaparin  40 mg/0.4 mL Syrg; Commonly known as: LOVENOX ; Inject 0.4 mL   (40 mg total) under the skin every twelve (12) hours.   fluticasone  propionate 50 mcg/actuation nasal spray; Commonly known as:   FLONASE ; 2 sprays into each nostril daily.   gabapentin  250 mg/5 mL oral solution; Commonly known as: NEURONTIN ; Take   6 mL (300 mg total) by mouth every eight (8) hours for 5 days.   hydrOXYzine  50 MG capsule; Commonly known as: VISTARIL ; Take 2 capsules   (100 mg total) by mouth nightly. And takes PRN   INFANTS' MYLICON 40 mg/0.6 mL drops; Generic drug: simethicone ; Take 0.6   mL (40 mg total) by mouth every four (4) hours as needed.   lamoTRIgine  25 MG Tchd; Commonly known as: LaMICtal ; Take 1 tablet (25   mg total) by mouth daily.   lancets Misc; Commonly known as: ACCU-CHEK SOFTCLIX LANCETS; Check sugar   three times per day before meals for insulin  dependent type two diabetes.    E11.65   LUPRON  DEPOT IM   methocarbamol  500 MG tablet; Commonly known as: ROBAXIN ; Take 1 tablet   (500 mg total) by mouth Three (3) times a day as needed.   omeprazole  20 MG capsule; Commonly known as: PriLOSEC; Take 1 capsule   (20 mg total) by mouth daily. Open capsule, sprinkle granules in liquid or   mix with greek yogurt to take   ondansetron  4 MG disintegrating tablet; Commonly known as: ZOFRAN -ODT;   Dissolve 1 tablet (4 mg total) in the mouth every six (6) hours as needed   for nausea.   oxyCODONE  5 mg/5 mL solution; Commonly known as: ROXICODONE ; Take 5 mL   (5 mg total) by mouth every four (4) hours as needed   TECHLITE PEN NEEDLE 32 gauge x 1/4 (6 mm) Ndle; Generic drug: pen   needle, diabetic; USE 3 TIMES DAILY AS DIRECTED   traZODone  100 MG tablet; Commonly known as: DESYREL ; TAKE 2 TABLETS BY   MOUTH AT BEDTIME   VITAMIN D3 ORAL   ziprasidone  80 MG capsule; Commonly known as: GEODON      STOP taking these medications  nitrofurantoin  (macrocrystal-monohydrate) 100 MG capsule; Commonly known   as: MACROBID        Pending Test Results:     Discharge Instructions:  Activity:     Diet:    Other Instructions:    Labs and Other Follow-ups after Discharge:      Future Appointments:  Appointments which have been scheduled for you      Jun 24, 2024 12:00 PM  (Arrive by 11:30 AM)  RETURN GENERAL with Annitta Herminio Georgi, MD  [facility] MULTISPECIALTY SURGERY GI SURGERY [facility] REGION) [address]  [place] [address]  [phone]        Jun 30, 2024 8:40 AM  (Arrive by 8:25 AM)  RETURN CARDIOLOGY with Franky Prentice Cutting, MD  [place] [facility] [address] [facility] REGION) [address]  2nd Floor  [place] [address]  [phone]        Jul 07, 2024 12:30 PM  (Arrive by 12:00 PM)  LAB ONLY [place] with ADULT ONC LAB  [facility] ADULT ONCOLOGY LAB DRAW STATION [facility] REGION) [address]  [place] [address]  [phone]        Jul 07, 2024 1:30 PM  (Arrive by 1:00 PM)  RETURN ACTIVE [place] with Corean Dena Cart, [place]  [facility] HEMATOLOGY ONCOLOGY 2ND FLR [facility] REGION) [address]  [place] [address]  [phone]        Jul 12, 2024 2:40 PM  (Arrive by 2:25 PM)  PHYSICAL EXAM with Nurum Gideon Clarke, MD  [facility] INTERNAL MEDICINE WEAVER CROSSING [facility] REGION) [address]  [place] [address]  [phone]   Arrive 15 minutes prior to appointment.         Jul 15, 2024 11:10 AM  (Arrive by 10:55 AM)  RETURN VIDEO VISIT DIRECT LINK with Arlean CHRISTELLA Andrews, CPP  [facility] DIABETES AND ENDOCRINOLOGY [place] [facility] REGION) [address]  [facility] 1 through 4  [place] [address]  [phone]   Please sign into My [place] Chart at least 15 minutes before your appointment to complete the eCheck-In process. You must complete eCheck-In before you can start your video visit. We also recommend testing your audio and video connection to troubleshoot any issues before your visit begins. Click ???Join Video Visit??? to complete these checks. Once you have completed eCheck-In and tested your audio and video, click ???Join Call??? to connect to your visit.     For your video visit, you will need a computer with a working camera, speaker and microphone, a smartphone, or a tablet with internet access.    My [place] Chart enables you to manage your health, send non-urgent messages to your provider, view your test results, schedule and manage appointments, and request prescription refills securely and conveniently from your computer or mobile device.    You can go to [email or web address] to sign in to your My [place] Chart account with your username and password. If you have forgotten your username or password, please choose the ???Forgot Username???? and/or ???Forgot Password???? links to gain access. You also can access your My [place] Chart account with the free MyChart mobile app for Android or iPhone.    If you need assistance accessing your My  Chart account or for assistance in reaching your provider's office to reschedule or cancel your appointment, please call [facility] Access  Center [phone].         Jul 19, 2024 1:45 PM  (Arrive by 1:30 PM)  NURSE VISIT with ONCINF HMOB LABS  [facility] ONCOLOGY [facility] HEMATOLOGY [facility] REGION) [address]  1st Floor  [place] [address]  [phone]        Jul 19, 2024 4:05 PM  (Arrive by 3:50 PM)  RETURN DIABETES with Sheree Bong Garnett Schooner, MD  [facility] DIABETES AND ENDOCRINOLOGY [place] [facility] REGION) [address]  [facility] 1 through 4  [place] [address]  [phone]        Oct 17, 2024 1:45 PM  (Arrive by 1:30 PM)  NURSE VISIT with ONCINF HMOB LABS  [facility] ONCOLOGY [facility]EMATOLOGY [facility] REGION) [address]  1st Floor  [place] [address]  [phone]               I personally spent 15 minutes in discharge planning services.     [name], MD, MPH  06/09/2024  11:21 AM

## 2024-06-15 DIAGNOSIS — C9210 Chronic myeloid leukemia, BCR/ABL-positive, not having achieved remission: Principal | ICD-10-CM

## 2024-06-21 ENCOUNTER — Ambulatory Visit
Admit: 2024-06-21 | Discharge: 2024-06-22 | Payer: Medicaid (Managed Care) | Attending: Student in an Organized Health Care Education/Training Program | Primary: Student in an Organized Health Care Education/Training Program

## 2024-06-21 MED ORDER — URSODIOL 300 MG CAPSULE
ORAL_CAPSULE | Freq: Two times a day (BID) | ORAL | 5 refills | 30.00000 days | Status: CP
Start: 2024-06-21 — End: 2024-12-18

## 2024-06-21 MED ORDER — OMEPRAZOLE 20 MG CAPSULE,DELAYED RELEASE
ORAL_CAPSULE | Freq: Two times a day (BID) | ORAL | 0 refills | 180.00000 days | Status: CP
Start: 2024-06-21 — End: 2024-12-18

## 2024-06-21 NOTE — Unmapped (Addendum)
 Diet and exercise have been discussed at this visit.    You may begin taking the recommended bariatric vitamins daily.   Please start to increase all activity as tolerated.    Please increase your protein intake.    Continue to take your Bariatric Vitamins daily.      Today we prescribed a new medication named Actigall .  This medication is taken twice daily for six months only.   This medication is designed to help prevent gallstone formation as you are actively losing weight.      Please follow up with your primary care provider for management of any long term medications that may need dosage adjustments due to weight loss.    Please continue to take your acid suppression medication  twice daily for (6) months.           NEXT STEPS:    Appointment with our dietician in 3 weeks.    Appointment with Tara Zychowicz, NP in three months     These appointments will be schedule for your today when your check out at the front desk.    If you have any questions regarding your visit today please feel free to contact me at the number listed below.     Olam Gaskins, RN, Bariatric Coordinator  Surgical RN Coordinator for Dr. Annitta Manes  [email or web address]  Phone:  [phone]  Fax:       [phone]

## 2024-06-21 NOTE — Unmapped (Signed)
 Outpatient Follow-up Note    PRIMARY CARE PROVIDER: [name], MD     REFERRING PROVIDER: Gladis Fallow, MD  [address]  [address] [place],  [place] [address]    Subjective:      Jenna Mosley is a 33 y.o. female who is here today for a 3 week follow-up visit following laparoscopic gastric bypass.  Surgery was performed on 06/01/24. She was readmitted on 06/08/24 for nausea, vomiting, and chest pain. Cardiac work up negative, seen by cardiology with no further intervention. The patient is tolerating a post-gastric bypass diet without emesis or dumping syndrome. She has been having minimal protein intake, does not like protein shakes. Most protein is from eggs, but she is consuming <2 per day.  She is not taking vitamins, but she is exercising multiple times per week, mostly walking.     She is having trouble with the number of pills she needs to take daily and the volume of water with crushing the pills. She is having bowel movements daily, now diarrhea. Endorses feeling bloated and needing to burp, but no emesis and tolerating PO. Having some retrosternal burning.        Objective:     BP 138/102 (BP Position: Sitting)  - Pulse 66  - Temp 36.6 ??C (97.8 ??F)  - Ht 182.9 cm (6')  - Wt (!) 132.5 kg (292 lb)  - BMI 39.60 kg/m??     Weight History  Wt Readings from Last 6 Encounters:   06/21/24 (!) 132.5 kg (292 lb)   06/07/24 (!) 137 kg (302 lb)   06/07/24 (!) 137.1 kg (302 lb 3.2 oz)   05/24/24 (!) 141.8 kg (312 lb 11.2 oz)   05/17/24 (!) 144.9 kg (319 lb 6.4 oz)   04/26/24 (!) 148.6 kg (327 lb 11.2 oz)       Physical Exam:    General: Unremarkable.    HEENT: No scleral icterus, EOM intact.  Lungs: Clear.  Heart: RRR.  Abdomen: Soft, nontender, nondistended, incision sites healed.   Extremities: Benign. No LEE on exam.  Neuro: Grossly nonfocal.    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   - Features of chronic biliary tract disease with patchy interlobular bile duct loss, ductular reaction and mixed portal inflammation.  - Mild macrovesicular steatosis.  (See comment)        Assessment & Plan:     The patient is doing well after prior gastric bypass. She was reminded of diet and activity expectations, and multivitamins protocols were reviewed. Further recommendations were given regarding other types of protein sources and flavorless protein power in order to increase her protein intake. Additionally, we reiterated the need to start bariatric vitamin supplementation, as some of her fatigue, anorexia, and malaise may be related to early vitamin deficiencies. We reminded her to continue to avoid nonsteroidal anti-inflammatory medications including over-the-counter products.     Pathology results were reviewed with the patient and a referral to Hepatology was placed for further evaluation.    At this time we will increase her omeprazole  to BID given retrosternal burning. She should restart her home medications and can start trying to take them non-crushed. Continue to hold her home diuretic. We will rx Actigall  x6 months today. Advise follow up with PCP within the month. She will return to clinic in 1 month for follow up.  She will follow with our NP 3 months post-op, and with our RD 6 weeks post-op.     Woodie Sports, MD PGY-7  Advanced GI MIS/Bariatric Fellow  June 21, 2024 1:49 PM    Attestation    I saw and evaluated the patient, participating in the key portions of the service.  I reviewed the fellow's note.  I agree with the fellow's findings and plan.     [name], MD, MPH  06/28/2024  11:02 AM

## 2024-06-28 DIAGNOSIS — C9210 Chronic myeloid leukemia, BCR/ABL-positive, not having achieved remission: Principal | ICD-10-CM

## 2024-06-30 DIAGNOSIS — I5032 Chronic diastolic (congestive) heart failure: Principal | ICD-10-CM

## 2024-06-30 DIAGNOSIS — I10 Essential (primary) hypertension: Principal | ICD-10-CM

## 2024-06-30 MED ORDER — CARVEDILOL 6.25 MG TABLET
ORAL_TABLET | Freq: Two times a day (BID) | ORAL | 2 refills | 90.00000 days | Status: CP
Start: 2024-06-30 — End: ?

## 2024-06-30 NOTE — Unmapped (Addendum)
 Continue carvedilol   I will talk with Drs. Garnett and Trinidad about dapagliflozin  (Farxiga )  OK to be off of torsemide 

## 2024-06-30 NOTE — Unmapped (Signed)
 Patient in today for follow up.  She had gastric bypass with Dr. Trinidad on 06/01/24.  She reports she had CP after that she feels was gas.  She still feels heaviness in chest at times. Reports she is off diuretics.  Wants to know if this is okay.  She states occasional SOB, denies edema.

## 2024-06-30 NOTE — Unmapped (Signed)
 [facility]   [address] - Phone: [phone]  [place], [place] [address] - Fax: [phone]    PCP: [name], MD  Date of Service: 06/30/2024    Assessment/Plan:   Chronic HFpEF  Pulmonary hypertension  HTN  Overall doing well clinically after gastric bypass surgery. Think that her symptoms of dyspnea are potentially related to ongoing post-surgical changes -- workup has been reassuring thus far. Discussed potential evaluation, think it would be best to wait a few months to see where her symptom burden is after she has had some more time to recover from surgery -- at that point, would consider repeat echo +/- RHC if she is still dyspneic. She has been able to come off a lot of medications as she has lost weight, which is great -- would consider restarting SGLT2i as long as it is OK with her bariatrics and endocrinology teams.  -carvedilol  6.25 mg BID  -will discuss restarting dapagliflozin  with Drs. Garnett armin Georgi  -OK to stay off of torsemide     Return to clinic: Return in about 6 months (around 12/31/2024).    I personally spent 35 minutes face-to-face and non-face-to-face in the care of this patient, which includes all pre, intra, and post visit time on the date of service.    Franky FELIX Gil, MD, [facility]  Assistant Professor of Medicine  Division of Cardiology  [facility]    Subjective:   Patient ID: Jenna Mosley is an 33 y.o. female patient with chronic HFpEF, pulmonary HTN, obesity s/p gastric bypass, HTN, and DM2, here for follow-up of HFpEF.    History of Present Illness  Last seen in our clinic on 08/13/2023. Since then, she has had gastric bypass surgery, which she tolerated well. She experiences persistent chest heaviness and difficulty with deep breathing following bariatric surgery. She has had EKG and troponin levels that were reassuring. She attributes the chest pain to gas but has been taking gas medication without relief. Her liver biopsy during surgery showed copper accumulation and scarring, and she is awaiting further evaluation by a hepatologist.    She has diabetes, previously managed with multiple medications, including Farxiga , which was discontinued around her surgery. Her A1c is now around 5, and she is off all diabetic medications. She has lost significant weight, from 375 to under 300 pounds, and has stopped several medications, including Lyrica , gabapentin , hydroxyzine , and insulin . She continues on carvedilol  and has had pharmacy issues with refills. She reports diarrhea and is focusing on a high-quality diet.    Objective:   Vital Signs  BP 126/76 (BP Site: L Arm, BP Position: Sitting, BP Cuff Size: Medium)  - Pulse 64  - Ht 182.9 cm (6')  - Wt (!) 135.7 kg (299 lb 1.6 oz)  - SpO2 98%  - BMI 40.57 kg/m??   Physical Exam  GENERAL: Alert, well developed, no acute distress.  CHEST: Clear to auscultation bilaterally.  CARDIOVASCULAR: Regular rate and rhythm, no murmurs, rubs, or gallops. Scant bilateral pedal edema. JVP difficult to visualize due to body habitus.  ABDOMEN: Soft and non-tender.  EXTREMITIES: No swelling except for scant pedal edema.    Results  LABS  BMP: Na 146, K 3.9, Cr 0.58 (06/09/2024)  ProBNP: 520 (06/08/2024)    DIAGNOSTIC  EKG: Normal sinus rhythm with nonspecific T wave changes and QTc of 474 (06/09/2024)    The ASCVD Risk score (Arnett DK, et al., 2019)  failed to calculate.
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
## 2024-07-04 NOTE — Unmapped (Signed)
 [facility] Clinical Assessment & Refill Coordination Note    Jenna Mosley, DOB: 20-Sep-1991  Phone: There are no phone numbers on file.    All above HIPAA information was verified with patient.     Was a Nurse, learning disability used for this call? No    Specialty Medication(s):   Hematology/Oncology: Bosulif      Current Medications[1]     Changes to medications: has not resumed Ursodiol  at this time    Medication list has been reviewed and updated in Epic: Yes    Allergies[2]    Changes to allergies: No    Allergies have been reviewed and updated in Epic: Yes    SPECIALTY MEDICATION ADHERENCE     Bosulif  500 mg: 3 days of medicine on hand     Medication Adherence    Patient reported X missed doses in the last month: >5  Specialty Medication: Bosulif  500 mg once daily  Patient is on additional specialty medications: No  Informant: patient  Reasons for non-adherence: instructed by provider to hold or take differently  Confirmed plan for next specialty medication refill: delivery by pharmacy  Refills needed for supportive medications: not needed          Are there any concerns with adherence? No - medication was on hold    Adherence counseling provided? Not needed    Patient-Reported Symptoms Tracker for Cancer Patients on Oral Chemotherapy     Oral chemotherapy medication name(s): Bosulif   Dose and frequency: 500 mg once daily  Oral Chemotherapy Start Date:    Baseline? No  Clinic(s) visited: Hematology  Were you able to reach the patient on a call today? Yes    Symptom Grouping Question Patient Response   Digestion and Eating Have you felt sick to your stomach? Denies    Had diarrhea? Denies    Constipated? Denies    Not wanting to eat? Denies    Comments      Sleep and Pain Felt very tired even after you rest? Denies    Pain due to cancer medication or cancer? Denies    Comments     Other Side Effects Numbness or tingling in hands and/or feet? Denies    Felt short of breath? Denies    Mouth or throat Sores? Denies    Rash? Denies    Palmar-plantar erythrodysesthesia syndrome?      Rash - acneiform?      Rash - maculo-papular?      How many days over the past month did your cancer medication or cancer keep you from your normal activities?  Write in number of days, 0-30:  0    Other side effects or things you would like to discuss?      Comments?     Adherence  In the last 30 days, on how many days did you miss at least one dose of any of your [drug name]? Write in number of days, 0-30:  >5    What reasons are you having trouble taking your medication [pharmacist: check all that apply]? Specify chemotherapy cycle:        Instructed by provider to hold or take differently    Comments:   medication was on hold per provider    Comments       Optional Symptom Tracking  New start - hematology ([facility]    Comments:      Specialty Pharmacist Interventions:     Question Patient Response  What actions did you take in response to the patient's PRO answers?     Other (Specify):  reviewed med and allergy  list       CLINICAL MANAGEMENT AND INTERVENTION      Clinical Benefit Assessment:    Do you feel the medicine is effective or helping your condition? Yes    Clinical Benefit counseling provided? Not needed    Acute Infection Status:    Acute infections noted within Epic:  No active infections    Patient reported infection: None    Therapy Appropriateness:    Is therapy appropriate based on current medication list, adverse reactions, adherence, clinical benefit and progress toward achieving therapeutic goals?  Yes, therapy is appropriate and should be continued    DISEASE/MEDICATION-SPECIFIC INFORMATION      N/A    Is the patient receiving adequate infection prevention treatment? Not applicable    Does the patient have adequate nutritional support? Not applicable    PATIENT SPECIFIC NEEDS     Does the patient have any physical, cognitive, or cultural barriers? No    Is the patient high risk? Yes, patient is taking oral chemotherapy. Appropriateness of therapy as been assessed    Did the patient require a clinical intervention? No    Does the patient require physician intervention or other additional services (i.e., nutrition, smoking cessation, social work)? No    Does the patient have an additional or emergency contact listed in their chart? Yes    SOCIAL DETERMINANTS OF HEALTH     At the [facility], we have learned that life circumstances - like trouble affording food, housing, utilities, or transportation can affect the health of many of our patients.   That is why we wanted to ask: are you currently experiencing any life circumstances that are negatively impacting your health and/or quality of life? Patient declined to answer    Social Drivers of Health     Food Insecurity: No Food Insecurity (06/02/2024)    Hunger Vital Sign     Worried About Running Out of Food in the Last Year: Never true     Ran Out of Food in the Last Year: Never true   Tobacco Use: Medium Risk (06/21/2024)    Patient History     Smoking Tobacco Use: Former     Smokeless Tobacco Use: Never     Passive Exposure: Past   Transportation Needs: No Transportation Needs (06/02/2024)    PRAPARE - Transportation     Lack of Transportation (Medical): No     Lack of Transportation (Non-Medical): No   Alcohol  Use: Not At Risk (12/31/2023)    Alcohol  Use     How often do you have a drink containing alcohol ?: Never     How many drinks containing alcohol  do you have on a typical day when you are drinking?: Not on file     How often do you have 5 or more drinks on one occasion?: Never   Housing: Low Risk  (06/02/2024)    Housing     Within the past 12 months, have you ever stayed: outside, in a car, in a tent, in an overnight shelter, or temporarily in someone else's home (i.e. couch-surfing)?: No     Are you worried about losing your housing?: No   Physical Activity: Not on file   Utilities: Low Risk  (12/31/2023)    Utilities     Within the past 12 months, have you been unable to get utilities (heat,  electricity) when it was really needed?: No   Stress: Not on file   Interpersonal Safety: Not on file   Substance Use: Low Risk  (12/31/2023)    Substance Use     In the past year, how often have you used prescription drugs for non-medical reasons?: Never     In the past year, how often have you used illegal drugs?: Never     In the past year, have you used any substance for non-medical reasons?: No   Intimate Partner Violence: Not on file   Social Connections: Not on file   Financial Resource Strain: Low Risk  (06/02/2024)    Overall Financial Resource Strain (CARDIA)     Difficulty of Paying Living Expenses: Not very hard   Health Literacy: Medium Risk (05/14/2021)    Health Literacy     : Sometimes   Internet Connectivity: Not on file       Would you be willing to receive help with any of the needs that you have identified today? Not applicable       SHIPPING     Specialty Medication(s) to be Shipped:   Hematology/Oncology: Bosulif     Other medication(s) to be shipped: No additional medications requested for fill at this time    Specialty Medications not needed at this time: N/A     Changes to insurance: No    Cost and Payment: Patient has a copay of $4. They are aware and have authorized the pharmacy to charge the credit card on file.    Delivery Scheduled: Yes, Expected medication delivery date: 07/06/24.     Medication will be delivered via Next Day Courier to the confirmed prescription address in [facility].    The patient will receive a drug information handout for each medication shipped and additional FDA Medication Guides as required.  Verified that patient has previously received a Conservation officer, historic buildings and a Surveyor, mining.    The patient or caregiver noted above participated in the development of this care plan and knows that they can request review of or adjustments to the care plan at any time.      All of the patient's questions and concerns have been addressed.    Aneita DELENA Merck, [facility]   [facility]  Pharmacist       [1]   Current Outpatient Medications   Medication Sig Dispense Refill    acetaminophen  (TYLENOL ) 160 mg/5 mL solution Take 31.2 mL (1,000 mg total) by mouth every six (6) hours as needed. 473 mL 0    albuterol  HFA 90 mcg/actuation inhaler Inhale 2 puffs every six (6) hours as needed. 8 g 0    bosutinib 500 mg Tab Take 1 tablet (500 mg total) by mouth once daily. Administer with food. Swallow tablet whole; do not cut, crush, break, or chew. 30 tablet 2    calcium  carbonate-vitamin D3 600 mg-20 mcg (800 unit) Tab Take 1 mg by mouth Two (2) times a day (at 8am and 12:00).      carvedilol  (COREG ) 6.25 MG tablet Take 1 tablet (6.25 mg total) by mouth two (2) times a day. 180 tablet 2    cetirizine  (ZYRTEC ) 10 MG tablet TAKE 1 TABLET BY MOUTH IN THE MORNING. 90 tablet 3    cholecalciferol, vitamin D3, (VITAMIN D3 ORAL) Take by mouth.      clonazePAM  (KLONOPIN ) 0.5 MG tablet Take 1 tablet (0.5 mg total) by mouth daily as needed. PRN  docusate (COLACE) 50 mg/5 mL liquid Take 10 mL (100 mg total) by mouth two (2) times a day. 473 mL 1    ferrous sulfate  325 (65 FE) MG tablet Take 1 tablet (325 mg total) by mouth in the morning.      fluticasone  propionate (FLONASE ) 50 mcg/actuation nasal spray 2 sprays into each nostril daily. 16 g 0    lamoTRIgine  (LAMICTAL ) 25 MG TChD Take 1 tablet (25 mg total) by mouth daily. 180 tablet 3    leuprolide  acetate (LUPRON  DEPOT IM) Inject into the muscle. Every 3 months      omeprazole  (PRILOSEC) 20 MG capsule Take 1 capsule (20 mg total) by mouth two (2) times a day. Open capsule, sprinkle granules in liquid or mix with greek yogurt to take 360 capsule 0    ondansetron  (ZOFRAN -ODT) 4 MG disintegrating tablet Dissolve 1 tablet (4 mg total) in the mouth every six (6) hours as needed for nausea. 30 tablet 1    traZODone  (DESYREL ) 100 MG tablet TAKE 2 TABLETS BY MOUTH AT BEDTIME 180 tablet 3    ursodiol  (ACTIGALL ) 300 mg capsule Take 1 capsule (300 mg total) by mouth two (2) times a day. (Patient taking differently: Take 1 capsule (300 mg total) by mouth two (2) times a day.) 60 capsule 5    ziprasidone  (GEODON ) 80 MG capsule Take 1 capsule (80 mg total) by mouth in the morning and 1 capsule (80 mg total) in the evening. Take with meals.      blood sugar diagnostic (ACCU-CHEK GUIDE TEST STRIPS) Strp Check sugars before meals three times for insulin  dependent type two diabetes. E11.65 100 each 11    blood-glucose sensor (DEXCOM G7 SENSOR) Devi Change sensor every 10 days. (Patient not taking: Reported on 06/30/2024) 9 each 3    carvedilol  (COREG ) 6.25 MG tablet Take 1 tablet (6.25 mg total) by mouth two (2) times a day.      cyanocobalamin , vitamin B-12, 1,000 mcg/mL Kit Inject 1,000 mcg as directed every thirty (30) days. 3 kit 4    [Paused] cyclobenzaprine  (FLEXERIL ) 5 MG tablet Take 1 tablet (5 mg total) by mouth Three (3) times a day as needed (lower back pain). 90 tablet 1    [Paused] dapagliflozin  propanediol (FARXIGA ) 10 mg Tab tablet TAKE ONE TABLET BY MOUTH EVERY MORNING (Patient not taking: Reported on 06/30/2024) 90 tablet 0    lancets (ACCU-CHEK SOFTCLIX LANCETS) Misc Check sugar three times per day before meals for insulin  dependent type two diabetes.  E11.65 100 each 11    [Paused] norethindrone  (AYGESTIN ) 5 mg tablet TAKE ONE TABLET EVERY MORNING 30 tablet 11     No current facility-administered medications for this visit.   [2]   Allergies  Allergen Reactions    Augmentin  [Amoxicillin -Pot Clavulanate] Angioedema     Rash and angioedema    Lisinopril Shortness Of Breath     Other Reaction(s): chest pain    SOB, chest painSOB, chest pain    Naproxen Nausea Only, Palpitations and Other (See Comments)     Chest palpitations and feels like flying    Aripiprazole Other (See Comments)     Inability to urinate    Other Reaction(s): cannot void    Ciprofloxacin Other (See Comments)     Does not work    Fluphenazine      mental health problems    Metoclopramide Other (See Comments)     Mania    ManiaMania    Prednisone Other (See  Comments)     mania    Reglan [Metoclopramide Hcl] Other (See Comments)     Induces mania    Diphenhydramine Hcl Anxiety     Other Reaction(s): mania    Promethazine Anxiety

## 2024-07-07 ENCOUNTER — Inpatient Hospital Stay: Admit: 2024-07-07 | Discharge: 2024-07-07 | Payer: Medicaid (Managed Care)

## 2024-07-07 ENCOUNTER — Ambulatory Visit
Admit: 2024-07-07 | Discharge: 2024-07-07 | Payer: Medicaid (Managed Care) | Attending: Student in an Organized Health Care Education/Training Program | Primary: Student in an Organized Health Care Education/Training Program

## 2024-07-07 ENCOUNTER — Ambulatory Visit: Admit: 2024-07-07 | Discharge: 2024-07-07 | Payer: Medicaid (Managed Care)

## 2024-07-07 ENCOUNTER — Other Ambulatory Visit: Admit: 2024-07-07 | Discharge: 2024-07-07 | Payer: Medicaid (Managed Care)

## 2024-07-07 DIAGNOSIS — C9210 Chronic myeloid leukemia, BCR/ABL-positive, not having achieved remission: Principal | ICD-10-CM

## 2024-07-07 DIAGNOSIS — E876 Hypokalemia: Principal | ICD-10-CM

## 2024-07-07 LAB — COMPREHENSIVE METABOLIC PANEL
ALBUMIN: 3.5 g/dL (ref 3.4–5.0)
ALKALINE PHOSPHATASE: 183 U/L — ABNORMAL HIGH (ref 46–116)
ALT (SGPT): 8 U/L — ABNORMAL LOW (ref 10–49)
ANION GAP: 8 mmol/L (ref 5–14)
AST (SGOT): 18 U/L (ref <=34)
BILIRUBIN TOTAL: 0.3 mg/dL (ref 0.3–1.2)
BLOOD UREA NITROGEN: <5 mg/dL — ABNORMAL LOW (ref 9–23)
CALCIUM: 9.4 mg/dL (ref 8.7–10.4)
CHLORIDE: 105 mmol/L (ref 98–107)
CO2: 30 mmol/L (ref 20.0–31.0)
CREATININE: 0.76 mg/dL (ref 0.55–1.02)
EGFR CKD-EPI (2021) FEMALE: >90 mL/min/1.73m2 (ref >=60)
GLUCOSE RANDOM: 99 mg/dL (ref 70–179)
POTASSIUM: 2.9 mmol/L — ABNORMAL LOW (ref 3.4–4.8)
PROTEIN TOTAL: 6.9 g/dL (ref 5.7–8.2)
SODIUM: 143 mmol/L (ref 135–145)

## 2024-07-07 LAB — CBC W/ AUTO DIFF
BASOPHILS ABSOLUTE COUNT: 0 10*9/L (ref 0.0–0.1)
BASOPHILS RELATIVE PERCENT: 0.8 %
EOSINOPHILS ABSOLUTE COUNT: 0.1 10*9/L (ref 0.0–0.5)
EOSINOPHILS RELATIVE PERCENT: 2.4 %
HEMATOCRIT: 36.3 % (ref 34.0–44.0)
HEMOGLOBIN: 12.2 g/dL (ref 11.3–14.9)
LYMPHOCYTES ABSOLUTE COUNT: 2.3 10*9/L (ref 1.1–3.6)
LYMPHOCYTES RELATIVE PERCENT: 39 %
MEAN CORPUSCULAR HEMOGLOBIN CONC: 33.5 g/dL (ref 32.0–36.0)
MEAN CORPUSCULAR HEMOGLOBIN: 28.9 pg (ref 25.9–32.4)
MEAN CORPUSCULAR VOLUME: 86.3 fL (ref 77.6–95.7)
MEAN PLATELET VOLUME: 11.3 fL — ABNORMAL HIGH (ref 6.8–10.7)
MONOCYTES ABSOLUTE COUNT: 0.4 10*9/L (ref 0.3–0.8)
MONOCYTES RELATIVE PERCENT: 5.9 %
NEUTROPHILS ABSOLUTE COUNT: 3.1 10*9/L (ref 1.8–7.8)
NEUTROPHILS RELATIVE PERCENT: 51.9 %
PLATELET COUNT: 121 10*9/L — ABNORMAL LOW (ref 150–450)
RED BLOOD CELL COUNT: 4.21 10*12/L (ref 3.95–5.13)
RED CELL DISTRIBUTION WIDTH: 16 % — ABNORMAL HIGH (ref 12.2–15.2)
WBC ADJUSTED: 6 10*9/L (ref 3.6–11.2)

## 2024-07-07 MED ORDER — POTASSIUM CHLORIDE ER 10 MEQ TABLET,EXTENDED RELEASE(PART/CRYST)
ORAL_TABLET | Freq: Every day | ORAL | 1 refills | 30.00000 days | Status: CP
Start: 2024-07-07 — End: ?

## 2024-07-07 MED ORDER — ONDANSETRON HCL 4 MG TABLET
ORAL_TABLET | Freq: Three times a day (TID) | ORAL | 1 refills | 20.00000 days | Status: CP | PRN
Start: 2024-07-07 — End: ?

## 2024-07-07 MED ADMIN — potassium chloride 10 mEq in 100 mL IVPB: 10 meq | INTRAVENOUS | @ 22:00:00 | Stop: 2024-07-08

## 2024-07-07 MED ADMIN — potassium chloride 10 mEq in 100 mL IVPB: 10 meq | INTRAVENOUS | @ 20:00:00 | Stop: 2024-07-08

## 2024-07-07 MED ADMIN — potassium chloride 10 mEq in 100 mL IVPB: 10 meq | INTRAVENOUS | @ 21:00:00 | Stop: 2024-07-08

## 2024-07-07 NOTE — Unmapped (Signed)
 It was great to see you today! Continue your Bosutinib daily    I will sent in potassium 10 mEq pills for you, try to take two of these a day    You will get IV potassium today    I will see you back in 4 weeks     If you have questions or concerns, you may call the Nurse call line at [phone].    Lab on 07/07/2024   Component Date Value Ref Range Status    Sodium 07/07/2024 143  135 - 145 mmol/L Final    Potassium 07/07/2024 2.9 (L)  3.4 - 4.8 mmol/L Final    Chloride 07/07/2024 105  98 - 107 mmol/L Final    CO2 07/07/2024 30.0  20.0 - 31.0 mmol/L Final    Anion Gap 07/07/2024 8  5 - 14 mmol/L Final    BUN 07/07/2024 <5 (L)  9 - 23 mg/dL Final    Creatinine 91/78/7974 0.76  0.55 - 1.02 mg/dL Final    eGFR CKD-EPI (2021) Female 07/07/2024 >90  >=60 mL/min/1.27m2 Final    eGFR calculated with CKD-EPI 2021 equation in accordance with [employer] and [employer] of Nephrology Task Force recommendations.    Glucose 07/07/2024 99  70 - 179 mg/dL Final    Calcium  07/07/2024 9.4  8.7 - 10.4 mg/dL Final    Albumin 91/78/7974 3.5  3.4 - 5.0 g/dL Final    Total Protein 07/07/2024 6.9  5.7 - 8.2 g/dL Final    Total Bilirubin 07/07/2024 0.3  0.3 - 1.2 mg/dL Final    AST 91/78/7974 18  <=34 U/L Final    ALT 07/07/2024 8 (L)  10 - 49 U/L Final    Alkaline Phosphatase 07/07/2024 183 (H)  46 - 116 U/L Final    WBC 07/07/2024 6.0  3.6 - 11.2 10*9/L Final    RBC 07/07/2024 4.21  3.95 - 5.13 10*12/L Final    HGB 07/07/2024 12.2  11.3 - 14.9 g/dL Final    HCT 91/78/7974 36.3  34.0 - 44.0 % Final    MCV 07/07/2024 86.3  77.6 - 95.7 fL Final    MCH 07/07/2024 28.9  25.9 - 32.4 pg Final    MCHC 07/07/2024 33.5  32.0 - 36.0 g/dL Final    RDW 91/78/7974 16.0 (H)  12.2 - 15.2 % Final    MPV 07/07/2024 11.3 (H)  6.8 - 10.7 fL Final    Platelet 07/07/2024 121 (L)  150 - 450 10*9/L Final    Neutrophils % 07/07/2024 51.9  % Final    Lymphocytes % 07/07/2024 39.0  % Final    Monocytes % 07/07/2024 5.9  % Final Eosinophils % 07/07/2024 2.4  % Final    Basophils % 07/07/2024 0.8  % Final    Absolute Neutrophils 07/07/2024 3.1  1.8 - 7.8 10*9/L Final    Absolute Lymphocytes 07/07/2024 2.3  1.1 - 3.6 10*9/L Final    Absolute Monocytes 07/07/2024 0.4  0.3 - 0.8 10*9/L Final    Absolute Eosinophils 07/07/2024 0.1  0.0 - 0.5 10*9/L Final    Absolute Basophils 07/07/2024 0.0  0.0 - 0.1 10*9/L Final

## 2024-07-07 NOTE — Unmapped (Signed)
 Patient arrived to unit with no s/s of distress.  Patient verbalized no complaints.  Infusion initiated.  Report given to [facility] RN  Patient stable at hand off

## 2024-07-07 NOTE — Unmapped (Signed)
 Assumed care of patient from [facility] @ [id number] & patient has IV K+ running through bilateral (2)-PIVs. Patient in stable condition. Completed runs of K+ and discharged home (ambulatory) in stable condition.

## 2024-07-07 NOTE — Unmapped (Signed)
 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 Fischel and her mom today in clinic to discuss restarting bosutinib for her [name] after ~1 month off TKI in the setting of having a gastric bypass surgery. She stopped bosutinib 2 weeks prior to surgery around 05/18/24, had surgery on 7/16, and restart bosutinib the first week of August. She has been taking bosutinib now for about 2 weeks. Unfortunately, she feels quite poorly since her surgery. Reports significant nausea (no vomiting), diarrhea, and gas/bloating that has caused a great deal of pain and discomfort. She also endorses feeling extremely fatigued and run-down. The symptom that she picks out specifically since re-starting bosutinib is headaches. Endorses poor PO intake and hydration due to symptoms. She has also had significant trouble taking her medications due to her GI symptoms. She prioritizes taking her bosutinib, bariatric MVI, and lamotrigine , but struggles to get all of her other medications down without discomfort. Diarrhea has been persistent. Denies fevers, SOB, bruising/bleeding. She will see her PCP and surgery team next week. She would like to continue treatment with bosutinib at this time. Medication list reviewed and updated.     Labs (07/07/24): WBC 6.0, Hgb 12.2, PLT 121, ANC 3.1, CMP notable for K 2.9 (likely attributable to poor PO intake), Scr 0.76 (Increased from last check but still within baseline range)  BCR::ABL1 p210 (04/04/24): 0.011 (MMR); pending from today    Oncologic History:  Hematology/Oncology History Overview Note   Diagnosis: [name]    Bone Marrow Biopsy:  Bone marrow, right iliac, aspiration and biopsy (02/28/21)  -  Hypercellular bone marrow (>95%) involved by chronic myeloid leukemia, BCR/ABL-1-positive, chronic phase (1% blasts by manual aspirate differential)  -  No significant marrow fibrosis    Cytogenetics: Abnormal Karyotype. 46,XX,t(9;22)(q34;q11.2)[20]    Treatment: imatinib   - 03/27/21    BCR/ABL p210 06/25/21: 34.403%  BCR-ABL1 Mutation Analysis: Negative    Switched to bosutinib on 07/12/21    Bosutinib 500 mg daily- 08/09/2021    09/26/21 BCR-ABL1 PCR: 0.862%    12/26/21 BCR-ABL: 0.113%    03/27/22 BCR-ABL: 0.040%    04/22/22 BCR-ABL: 0.041%    07/02/22 BCR-ABL: 0.037%    10/02/2022: BCR-ABL: 0.034%    12/30/2022: BCR-ABL: 0.018%    01/02/2023: Bosutinib held due to fluid overload causing hospitalization    01/15/2023: Bosutinib restarted    04/01/2023: BCR-ABL : 0.027%    05/2023: Bosutinib unknowingly held for 3-4 weeks per patient    07/01/2023: BCR-ABL:  0.012%    10/01/2023: BCR-ABL: 0.019%    11/26/2023: BCR-ABL: 0.014%    01/11/2024: BCR-ABL 0.007%    04/05/2024: BCR-ABL pending       [name] (chronic myelocytic leukemia)       02/19/2021 Initial Diagnosis    [name] (chronic myelocytic leukemia) (CMS-HCC)     04/23/2021 Endocrine/Hormone Therapy    OP LEUPROLIDE  (LUPRON ) 11.25 MG EVERY 3 MONTHS  Plan Provider: Dorothyann Bunnie Saunders, MD         Weight and Vitals:  Wt Readings from Last 3 Encounters:   07/07/24 (!) 132.9 kg (292 lb 15.9 oz)   06/30/24 (!) 135.7 kg (299 lb 1.6 oz)   06/21/24 ROLLEN)  132.5 kg (292 lb)     Temp Readings from Last 3 Encounters:   07/07/24 36.4 ??C (97.5 ??F) (Temporal)   06/21/24 36.6 ??C (97.8 ??F)   06/09/24 36.8 ??C (98.2 ??F) (Oral)     BP Readings from Last 3 Encounters:   07/07/24 132/79   06/30/24 126/76   06/21/24 138/102     Pulse Readings from Last 3 Encounters:   07/07/24 50   06/30/24 64   06/21/24 66       Pertinent Labs:  Lab on 07/07/2024   Component Date Value Ref Range Status    Sodium 07/07/2024 143  135 - 145 mmol/L Final    Potassium 07/07/2024 2.9 (L)  3.4 - 4.8 mmol/L Final    Chloride 07/07/2024 105  98 - 107 mmol/L Final    CO2 07/07/2024 30.0  20.0 - 31.0 mmol/L Final    Anion Gap 07/07/2024 8  5 - 14 mmol/L Final    BUN 07/07/2024 <5 (L)  9 - 23 mg/dL Final    Creatinine 91/78/7974 0.76  0.55 - 1.02 mg/dL Final    eGFR CKD-EPI (2021) Female 07/07/2024 >90  >=60 mL/min/1.46m2 Final    eGFR calculated with CKD-EPI 2021 equation in accordance with [employer] and [employer] of Nephrology Task Force recommendations.    Glucose 07/07/2024 99  70 - 179 mg/dL Final    Calcium  07/07/2024 9.4  8.7 - 10.4 mg/dL Final    Albumin 91/78/7974 3.5  3.4 - 5.0 g/dL Final    Total Protein 07/07/2024 6.9  5.7 - 8.2 g/dL Final    Total Bilirubin 07/07/2024 0.3  0.3 - 1.2 mg/dL Final    AST 91/78/7974 18  <=34 U/L Final    ALT 07/07/2024 8 (L)  10 - 49 U/L Final    Alkaline Phosphatase 07/07/2024 183 (H)  46 - 116 U/L Final    WBC 07/07/2024 6.0  3.6 - 11.2 10*9/L Final    RBC 07/07/2024 4.21  3.95 - 5.13 10*12/L Final    HGB 07/07/2024 12.2  11.3 - 14.9 g/dL Final    HCT 91/78/7974 36.3  34.0 - 44.0 % Final    MCV 07/07/2024 86.3  77.6 - 95.7 fL Final    MCH 07/07/2024 28.9  25.9 - 32.4 pg Final    MCHC 07/07/2024 33.5  32.0 - 36.0 g/dL Final    RDW 91/78/7974 16.0 (H)  12.2 - 15.2 % Final    MPV 07/07/2024 11.3 (H)  6.8 - 10.7 fL Final    Platelet 07/07/2024 121 (L)  150 - 450 10*9/L Final    Neutrophils % 07/07/2024 51.9  % Final    Lymphocytes % 07/07/2024 39.0  % Final    Monocytes % 07/07/2024 5.9  % Final    Eosinophils % 07/07/2024 2.4  % Final    Basophils % 07/07/2024 0.8  % Final    Absolute Neutrophils 07/07/2024 3.1  1.8 - 7.8 10*9/L Final    Absolute Lymphocytes 07/07/2024 2.3  1.1 - 3.6 10*9/L Final    Absolute Monocytes 07/07/2024 0.4  0.3 - 0.8 10*9/L Final    Absolute Eosinophils 07/07/2024 0.1  0.0 - 0.5 10*9/L Final    Absolute Basophils 07/07/2024 0.0  0.0 - 0.1 10*9/L Final       Allergies: Allergies[1]    Drug Interactions: should ideally avoid PPI with bosutinib, previously on pantoprazole  and bosutinib and had good response, now on omeprazole ; if response after restarting bosutinib is suboptimal would  consider stopping omeprazole       Current Medications:  Current Medications[2]    Adherence: No barriers identified, biggest concern currently is GI upset/nausea preventing her from keeping medications down       Assessment: Ms.Knudtson is a 33 y.o. female with [name] being treated currently with bosutinib. Initially diagnosed in 01/2021 and began treatment with imatinib . Unfortunately did not see molecular response and was switched to bosutinib in 06/2021. Reportedly tolerated therapy well and achieved best response of MMR (approaching MR4).Therapy was interrupted per bariatric surgery's preference 2 weeks prior to gastric bypass. Labs appropriate to continue bosutinib today. Side effects are likely related in large part to post-surgical course and would preference keeping patient on her TKI given the interruption in treatment. Potassium likely low in the setting of poor nutritional intake, will supplement with IV and oral course today until PO intake can improve. Will continue to monitor symptoms and labs (especially platelets given recent drop) closely. Patient expressed understanding of the plan below.    Plan:   1) [name]  - Continue bosutinib 500 mg daily   - ARM:[id number] pending from today   - Local labs in ~ 2 weeks  - RTC in 1 month for labs and visit with Dr. Zeidner, scheduled for 08/11/24    2) Gastric bypass  - Continue bariatric MVI daily   - Follow-up with surgery for continued management of post-surgical course, follow up scheduled next week    3) Psych/Bipolar disorder   - Continue lamotrigine  100 mg daily   - Continue divalproex  DR 500 mg nightly   - Continue ziprasidone  80 mg BID   - Continue clonazepam  prn (has not needed recently)  - Continue to follow-up with psych and PCP for management, follow-up scheduled for next week    4) Cardiology (HTN, pHTN)   - Continue carvedilol  6.25 mg BID   - Follow-up with cardiologist about which dose of Farxiga  to restart     5) Diabetes  - Continue to follow with endocrine for management, next visit later this month     6) Supportive care   - Change ondansetron  ODT tablets to conventional tablets 4 mg prn nausea (per patient preference, does not tolerate the taste of ODT tabs)   - START potassium 20 mEq daily (40 mEq given IV in infusion today)   - Continue all MVI as prescribed   - Continue Lupron  injections every 3 months (next scheduled for next week, using for fertility preservation)   - Continue omeprazole  20 mg BID - separate from bosutinib as able (has been on pantoprazole  previously prior to surgery, will continue to monitor BCR-ABL closely while on PPI and adjust/stop as needed)   - Okay to start simethicone  for gas/bloating   - Continue trazodone  200 mg nightly for sleep   - Restart norethindrone  and ursodiol  when able     F/u:  Future Appointments   Date Time Provider Department Center   07/07/2024  3:30 PM ONCINF CHAIR 14 [id number]   07/12/2024 11:00 AM Trudy Robin Garre, RD [facility]   07/12/2024  2:40 PM Erdem, Grady Gear, MD [facility]   07/15/2024 11:10 AM Sanda Arlean HERO, CPP [facility]   07/19/2024  1:45 PM ONCINF HMOB LABS [facility]   07/19/2024  4:05 PM Garnett Athena Sheree Shelly, MD [facility]   07/22/2024 12:00 PM Trinidad Annitta Hasten, MD [id number]   08/11/2024  7:30 AM ADULT ONC LAB [facility]  [facility]   08/11/2024  8:30 AM Zeidner, Fonda Ryder, MD [id number]   09/22/2024  2:30 PM Zychowicz, Rexene Helling, FNP [facility]   10/17/2024  1:45 PM ONCINF [facility]MOB LABS [facility]       I spent 70 minutes with Ms.Stotler in direct patient care.       Harlene Hummer, PharmD    Waddell Picker, PharmD, BCOP, CPP  Acute Leukemia Clinical Pharmacist Practitioner          [1]   Allergies  Allergen Reactions    Augmentin  [Amoxicillin -Pot Clavulanate] Angioedema     Rash and angioedema    Lisinopril Shortness Of Breath     Other Reaction(s): chest pain    SOB, chest painSOB, chest pain    Naproxen Nausea Only, Palpitations and Other (See Comments)     Chest palpitations and feels like flying    Aripiprazole Other (See Comments)     Inability to urinate    Other Reaction(s): cannot void    Ciprofloxacin Other (See Comments)     Does not work    Fluphenazine      mental health problems    Metoclopramide Other (See Comments)     Mania    ManiaMania    Prednisone Other (See Comments)     mania    Reglan [Metoclopramide Hcl] Other (See Comments)     Induces mania    Diphenhydramine Hcl Anxiety     Other Reaction(s): mania    Promethazine Anxiety   [2]   Current Outpatient Medications   Medication Sig Dispense Refill    acetaminophen  (TYLENOL ) 160 mg/5 mL solution Take 31.2 mL (1,000 mg total) by mouth every six (6) hours as needed. 473 mL 0    albuterol  HFA 90 mcg/actuation inhaler Inhale 2 puffs every six (6) hours as needed. 8 g 0    blood sugar diagnostic (ACCU-CHEK GUIDE TEST STRIPS) Strp Check sugars before meals three times for insulin  dependent type two diabetes. E11.65 100 each 11    blood-glucose sensor (DEXCOM G7 SENSOR) Devi Change sensor every 10 days. (Patient not taking: Reported on 06/30/2024) 9 each 3    bosutinib 500 mg Tab Take 1 tablet (500 mg total) by mouth once daily. Administer with food. Swallow tablet whole; do not cut, crush, break, or chew. 30 tablet 2    calcium  carbonate-vitamin D3 600 mg-20 mcg (800 unit) Tab Take 1 mg by mouth Two (2) times a day (at 8am and 12:00).      carvedilol  (COREG ) 6.25 MG tablet Take 1 tablet (6.25 mg total) by mouth two (2) times a day. 180 tablet 2    cetirizine  (ZYRTEC ) 10 MG tablet TAKE 1 TABLET BY MOUTH IN THE MORNING. 90 tablet 3    cholecalciferol, vitamin D3, (VITAMIN D3 ORAL) Take by mouth.      clonazePAM  (KLONOPIN ) 0.5 MG tablet Take 1 tablet (0.5 mg total) by mouth daily as needed. PRN cyanocobalamin , vitamin B-12, 1,000 mcg/mL Kit Inject 1,000 mcg as directed every thirty (30) days. 3 kit 4    [Paused] dapagliflozin  propanediol (FARXIGA ) 10 mg Tab tablet TAKE ONE TABLET BY MOUTH EVERY MORNING (Patient not taking: Reported on 06/30/2024) 90 tablet 0    divalproex  ER (DEPAKOTE  ER) 500 MG extended released 24 hr tablet Take 1 tablet (500 mg total) by mouth nightly.      ferrous sulfate  325 (65 FE) MG tablet Take 1 tablet (325 mg total) by mouth in the morning.  fluticasone  propionate (FLONASE ) 50 mcg/actuation nasal spray 2 sprays into each nostril daily. 16 g 0    lamoTRIgine  (LAMICTAL ) 25 MG TChD Take 1 tablet (25 mg total) by mouth daily. 180 tablet 3    lancets (ACCU-CHEK SOFTCLIX LANCETS) Misc Check sugar three times per day before meals for insulin  dependent type two diabetes.  E11.65 100 each 11    leuprolide  acetate (LUPRON  DEPOT IM) Inject into the muscle. Every 3 months      multivit-min-iron -mefolate-K1 (BARIATRIC MULTIVITAMINS) 45 mg iron -800 mcg DFE-120 mcg cap Take by mouth. Taking 1 capsule daily (Fusion brand)      [Paused] norethindrone  (AYGESTIN ) 5 mg tablet TAKE ONE TABLET EVERY MORNING 30 tablet 11    omeprazole  (PRILOSEC) 20 MG capsule Take 1 capsule (20 mg total) by mouth two (2) times a day. Open capsule, sprinkle granules in liquid or mix with greek yogurt to take 360 capsule 0    ondansetron  (ZOFRAN ) 4 MG tablet Take 1 tablet (4 mg total) by mouth every eight (8) hours as needed for nausea. 60 tablet 1    potassium chloride  10 MEQ ER tablet Take 2 tablets (20 mEq total) by mouth daily. 60 tablet 1    traZODone  (DESYREL ) 100 MG tablet TAKE 2 TABLETS BY MOUTH AT BEDTIME 180 tablet 3    ursodiol  (ACTIGALL ) 300 mg capsule Take 1 capsule (300 mg total) by mouth two (2) times a day. (Patient taking differently: Take 1 capsule (300 mg total) by mouth two (2) times a day.) 60 capsule 5    ziprasidone  (GEODON ) 80 MG capsule Take 1 capsule (80 mg total) by mouth in the morning and 1 capsule (80 mg total) in the evening. Take with meals.       No current facility-administered medications for this visit.

## 2024-07-07 NOTE — Unmapped (Signed)
 VENOUS ACCESS ULTRASOUND PROCEDURE NOTE    Indications:   Poor venous access.    The Venous Access Team has assessed this patient for the placement of a PIV. Ultrasound guidance was necessary to obtain access.     Procedure Details:  Identity of the patient was confirmed via name, medical record number and date of birth. The availability of the correct equipment was verified.    The vein was identified for ultrasound catheter insertion.  Field was prepared with necessary supplies and equipment.  Probe cover and sterile gel utilized.  Insertion site was prepped with chlorhexidine  solution and allowed to dry.  The catheter extension was primed with normal saline. A(n) 22 gauge 1.75 catheter was placed in the R Forearm with 1attempt(s). See LDA for additional details.    Catheter aspirated, 3 mL blood return present. The catheter was then flushed with 10 mL of normal saline. Insertion site cleansed, and dressing applied per manufacturer guidelines. The catheter was inserted with difficulty due to poor vasculature and depth by Asberry DELENA Meadows, RN.    Primary RN was notified.     Thank you,     Asberry DELENA Meadows, RN Venous Access Team   [phone]     Workup / Procedure Time:  30 minutes    See vein image in PACs.

## 2024-07-08 DIAGNOSIS — C9210 Chronic myeloid leukemia, BCR/ABL-positive, not having achieved remission: Principal | ICD-10-CM
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
## 2024-07-12 ENCOUNTER — Encounter: Admit: 2024-07-12 | Discharge: 2024-07-13 | Payer: Medicaid (Managed Care)

## 2024-07-12 ENCOUNTER — Encounter
Admit: 2024-07-12 | Discharge: 2024-07-13 | Payer: Medicaid (Managed Care) | Attending: Geriatric Medicine | Primary: Geriatric Medicine

## 2024-07-12 LAB — HEMOGLOBIN A1C
ESTIMATED AVERAGE GLUCOSE: 111 mg/dL
HEMOGLOBIN A1C: 5.5 % (ref 4.8–5.6)

## 2024-07-12 LAB — COMPREHENSIVE METABOLIC PANEL
ALBUMIN: 3.6 g/dL (ref 3.4–5.0)
ALKALINE PHOSPHATASE: 165 U/L — ABNORMAL HIGH (ref 46–116)
ALT (SGPT): <7 U/L — ABNORMAL LOW (ref 10–49)
ANION GAP: 13 mmol/L (ref 5–14)
AST (SGOT): 15 U/L (ref <=34)
BILIRUBIN TOTAL: 0.3 mg/dL (ref 0.3–1.2)
BLOOD UREA NITROGEN: 5 mg/dL — ABNORMAL LOW (ref 9–23)
BUN / CREAT RATIO: 6
CALCIUM: 9.2 mg/dL (ref 8.7–10.4)
CHLORIDE: 106 mmol/L (ref 98–107)
CO2: 26 mmol/L (ref 20.0–31.0)
CREATININE: 0.81 mg/dL (ref 0.55–1.02)
EGFR CKD-EPI (2021) FEMALE: >90 mL/min/1.73m2 (ref >=60)
GLUCOSE RANDOM: 92 mg/dL (ref 70–179)
POTASSIUM: 3.3 mmol/L — ABNORMAL LOW (ref 3.4–4.8)
PROTEIN TOTAL: 7 g/dL (ref 5.7–8.2)
SODIUM: 145 mmol/L (ref 135–145)

## 2024-07-12 LAB — AMYLASE: AMYLASE: 52 U/L (ref 30–118)

## 2024-07-12 LAB — LIPID PANEL
CHOLESTEROL: 147 mg/dL (ref <200)
HDL CHOLESTEROL: 36 mg/dL — ABNORMAL LOW (ref >50)
LDL CHOLESTEROL CALCULATED: 83 mg/dL (ref <100)
NON-HDL CHOLESTEROL: 111 mg/dL (ref <130)
TRIGLYCERIDES: 186 mg/dL — ABNORMAL HIGH (ref <150)

## 2024-07-12 LAB — LIPASE: LIPASE: 19 U/L (ref 12–53)

## 2024-07-12 LAB — TSH: THYROID STIMULATING HORMONE: 0.588 u[IU]/mL (ref 0.550–4.780)

## 2024-07-12 MED ORDER — DAPAGLIFLOZIN PROPANEDIOL 5 MG TABLET
ORAL_TABLET | Freq: Every morning | ORAL | 3 refills | 90.00000 days | Status: CP
Start: 2024-07-12 — End: ?

## 2024-07-12 MED ORDER — NORETHINDRONE ACETATE 5 MG TABLET
ORAL_TABLET | Freq: Every day | ORAL | 3 refills | 90.00000 days | Status: CP
Start: 2024-07-12 — End: ?

## 2024-07-12 MED ORDER — CYANOCOBALAMIN (VIT B-12) 1,000 MCG/ML INJECTION KIT
PACK | INTRAMUSCULAR | 3 refills | 0.00000 days | Status: CP
Start: 2024-07-12 — End: ?

## 2024-07-12 MED ADMIN — cyanocobalamin (vitamin B-12) injection 1,000 mcg: 1000 ug | INTRAMUSCULAR | @ 20:00:00 | Stop: 2024-07-12

## 2024-07-12 NOTE — Unmapped (Signed)
 Continue vitamin d supplmentation.  Will check vitamin D level today.

## 2024-07-12 NOTE — Unmapped (Signed)
 The condition is stable based upon today's assessment.  Under treatment or active surveillance by appropriate specialist. She is followed by cardiology.

## 2024-07-12 NOTE — Unmapped (Signed)
 Continue current meds.  Patient needs to continue to follow up with psych and therapist.  Current living since duration with mom appears to be mutually beneficial.

## 2024-07-12 NOTE — Unmapped (Signed)
Seen by liver with assessment and plan as follows: CT CAP during ED visit 02/06/21 showed variably enhancing liver lesions, most consistent with hemangioma. 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 Most recent abdominal US revealing for hepatosplenomegaly. No ascites appreciated. Patient followed up with hepatology on 3/28, Fibro Scan unfortunately difficult to interpret due to body habitus. They recommended liver biopsy if clinically indicated. She is reassured her liver function is intact and we do not have concern for cirrhosis. She has simple hepatic steatosis due to metabolic syndrome. Weight loss and tight glucose control should be the focus.     Liver masses: unchanged in  years, non-worrisome at this point for malignancy. Suggest next imaging, MRI with Eovist to help

## 2024-07-12 NOTE — Unmapped (Signed)
 Refer for genetic counseling and start mammograms early given family history of breast cancer in grandmother age 35.

## 2024-07-12 NOTE — Unmapped (Signed)
 Low potassium levels likely secondary to diarrhea. Recent potassium infusions administered, with ongoing oral supplementation.  - Continue oral potassium supplementation  - Monitor potassium levels regularly

## 2024-07-12 NOTE — Unmapped (Signed)
 Diabetes is under much better control. Current HbA1c is 5.7, indicating good control.  Restart Farxiga  at 5 mg daily.  Currently off insulin , off GLP-1, off metformin .  Check hemoglobin A1c today.  No episodes of hypoglycemia.  - Continue current diabetes management plan.  - Monitor blood glucose levels, especially post-surgery.

## 2024-07-12 NOTE — Unmapped (Addendum)
 Patient reports longstanding hx of low vitamin B12. She has taken injections in the past but reports previously not tolerating them well. Managed with monthly injections. Difficulty with self-administration noted.  - Administer B12 injection today  - Prescribe prefilled B12 syringes for monthly administration  - Schedule nurse visits for B12 injections if prefilled syringes are not covered

## 2024-07-12 NOTE — Unmapped (Signed)
 Patient with previous diagnosis of CKD 3 A day.  Last renal function was adequate with an EGFR of 99.  Will continue to monitor carefully.  She has had consistently decreased kidney function since about 2022, and prior to then has had episodes of acute kidney injury that have resolved.  The cause of her kidney disease is unclear, but risk factors include diabetes mellitus, a history of urinary retention, and obesity.  Interestingly, she does not have evidence of diabetic retinopathy, and has no albuminuria.  [name] is not a common risk factor for chronic kidney disease; she has modest amount of proteinuria without albuminuria.  Her urine microscopy does not reveal any acute process to explain her kidney disease.  Seen by nephrology with the following assessment and plan: Given her young age, she is at high risk for progression to end-stage kidney disease, though not in the foreseeable future (2-5 years).  She also has a family history of end-stage kidney disease, likely signifying the inheritance of the gene that increases her baseline risk of kidney failure.  We discussed strategies to delay progression of her chronic kidney disease.  This includes blood glucose control with a goal hemoglobin A1c of less than 6%, blood pressure control, and avoidance of any nephrotoxic agents such as NSAIDs.  This includes discussion of any new medications with her care team to mitigate any possible nephrotoxicity.

## 2024-07-12 NOTE — Unmapped (Addendum)
 Weight is currently improving. Postoperative course complicated by severe diarrhea and malabsorption. Initial symptoms of nausea, vomiting, and chest pain improved with omeprazole . Current symptoms include extreme diarrhea and gas, with dietary restrictions impacting nutritional intake.  - Continue omeprazole  twice daily  - Follow up with bariatric surgeon for ongoing postoperative management  - Monitor nutritional intake and adjust diet as tolerated   - Continue to monitor weight.  Continue to check bariatric labs to assess nutritional intake.  Continue bariatric multivitamin.

## 2024-07-12 NOTE — Unmapped (Addendum)
 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 Diarrhea is watery, sometimes oily, associated with hypokalemia. Differential includes malabsorption, infection, bacterial overgrowth, gastroparesis and pancreatic insufficiency. C. diff test was negative prior to surgery. Symptoms include chills without fever, no abdominal pain or cramping. Possible fat malabsorption indicated by oily stools.  - Order stool studies for C. diff, GI pathogen panel (including Campylobacter, Shigella, Salmonella), and fecal fat  - Order hydrogen breath test for small intestinal bacterial overgrowth  - Order pancreatic elastase test to assess for pancreatic insufficiency  - Advise dietary modifications to include lean meats, low-carb yogurts, and avoidance of certain vegetables and fruits

## 2024-07-12 NOTE — Unmapped (Addendum)
 Management as per heme-onc.  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 She started imatinib  on 03/27/2021 but her BCR-ABL did not show improvement after 3 months and remained >10%. She switched to bosutinib 500 mg daily on 07/12/21 and has since achieved MMR.  Managed with oral medication. Recent interruption in treatment due to surgery, with resumption of therapy. Monitoring of BCR/ABL levels ongoing.  - Continue current leukemia management regimen  - Monitor BCR/ABL levels    - May be contributing to diarrhea however unlikely given patient has tolerated this well in the past.

## 2024-07-12 NOTE — Unmapped (Signed)
 Blood pressure is at goal below 130/80. Continue carvedilol  6.25 mg BID.  Currently off torsemide .  May want to consider ACE inhibitor at some point in the future.

## 2024-07-12 NOTE — Unmapped (Signed)
 Currently stable.  Hopeful that weight loss should help.  Followed by Pulmonology. Has had PFTs previously, Pulm planning for repeat at next visit. 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 No asthma diagnosis.  Recent sleep study echoed previous sleep studies which were not consistent with [name]. Echo done 12/31/22 with normal EF, only significant for mildly elevated right atrial pressure in the setting of mild Pulmonary HTN. Most likely multifactorial due to OHS, deconditioning, volume overload. Outpatient pulmonary follow up and rehab planned.  Patient was on an asthma treatment plan in the past however this was stopped because it did not help and no obstructive airway disease noted on exam.  Patient has albuterol  on hand to take as needed although not sure how much this will help.  As well I recommend she start exercising again.  She can use her home pulse oximeter and as long as her O2 sat stays above 92% she can continue exercising to improve her endurance.

## 2024-07-12 NOTE — Unmapped (Signed)
 Still marked depressive symptoms however patient feels that overall she is doing much better.  Seems like she is getting along well with her mother.  Continue current management.  Patient has follow-up with psychiatry and therapist.- Continued home meds lamictal , depakote , trazodone , and Geodon .  Patient additionally has clonazepam  on hand however only taking as needed.

## 2024-07-12 NOTE — Unmapped (Signed)
 Continue to work on healthy diet and increase activity level.  Many patients labs have already been checked.  Will add on hemoglobin A1c and lipid profile.  Continue bariatric multivitamin.  Work on getting adequate sleep.  Follow-up with psychiatry for mood.  Mammogram ordered.  Will start really given family history of breast cancer.  Will also refer to genetic counseling.  Aygestin  refilled.  Rest of her medications are up-to-date.

## 2024-07-12 NOTE — Unmapped (Signed)
 [facility]   Medical Nutrition Therapy Consultation       Visit Type:    Return Assessment    Referral Reason:   Bariatric surgery     Jenna Mosley is a 33 y.o. female seen for medical nutrition therapy for. Her active problem list, medication list, allergies, notes from last encounter, and lab results were reviewed.     Bariatric Surgery History:  Surgery Date: 06/01/2024  Surgical Procedure: laparoscopic gastric bypass   Surgeon: Dr. Annitta Georgi  Pre Surgery Weight: 312.7# from 05/24/2024    Anthropometrics   Height: 182.9 cm (6' 0.01)   Weight: (!) 132.9 kg (293 lb)  Body mass index is 39.73 kg/m??.    Wt Readings from Last 10 Encounters:   07/12/24 (!) 133.6 kg (294 lb 9.6 oz)   07/13/24 (!) 132.9 kg (293 lb)   07/07/24 (!) 132.9 kg (292 lb 15.9 oz)   06/30/24 (!) 135.7 kg (299 lb 1.6 oz)   06/21/24 (!) 132.5 kg (292 lb)   06/07/24 (!) 137 kg (302 lb)   06/07/24 (!) 137.1 kg (302 lb 3.2 oz)   05/24/24 (!) 141.8 kg (312 lb 11.2 oz)   05/17/24 (!) 144.9 kg (319 lb 6.4 oz)   04/26/24 (!) 148.6 kg (327 lb 11.2 oz)      Ideal Body Weight: 72.6 kg  Weight in (lb) to have BMI = 25: 184  Goal weight per patient: no lower than 200#   Weight Change: -8.95 kg since surgery    Nutrition Risk Screening:   Food Insecurity: No Food Insecurity (07/12/2024)    Hunger Vital Sign     Worried About Running Out of Food in the Last Year: Never true     Ran Out of Food in the Last Year: Never true      Nutrition Focused Physical Exam:   Nutrition Evaluation  Overall Impressions: Unable to perform Nutrition-Focused Physical Exam at this time due to (comment) (virtual visit) (07/13/24 1347)  Nutrition Designation: Obese class II  (BMI 35.00 - 39.99 kg/m2) (07/13/24 1347)     Malnutrition Screening:   Unable to complete Malnutrition Assessment at this time due to (comment) (virtual visit) (07/13/24 1431)    Micronutrient Deficiency Evaluation - did not have time to go over    Biochemical Data, Medical Tests and Procedures:  All pertinent labs and imaging reviewed by Jenna Mosley, Jenna Mosley at 3:16 PM 07/13/2024.    Lab Results   Component Value Date    HGB 12.2 07/07/2024    HCT 36.3 07/07/2024    MCV 86.3 07/07/2024    IRON  49 (L) 03/25/2024    TIBC 370 03/25/2024    FERRITIN 89.7 03/25/2024    VITAMINB12 285 11/26/2023    FOLATE 7.5 11/26/2023    VITDTOTAL 41.0 03/25/2024     Lab Results   Component Value Date    ALKPHOS 165 (H) 07/12/2024    ALT <7 (L) 07/12/2024    AST 15 07/12/2024    BUN 5 (L) 07/12/2024    CREATININE 0.81 07/12/2024    GFRNONAA >=60 03/02/2018    GFRAA >=60 03/02/2018     Lab Results   Component Value Date    CHOL 147 07/12/2024    HDL 36 (L) 07/12/2024    LDL 83 07/12/2024    NONHDL 111 07/12/2024    TRIG 186 (H) 07/12/2024    A1C 5.5 07/12/2024    TSH 0.588 07/12/2024  Medications and Vitamin/Mineral Supplementation:   All nutritionally pertinent medications reviewed on 07/12/2024  Nutritionally pertinent medications include: Coreg , Prilosec, Zofran , Trazodone , Actigall   She is taking nutrition supplements. Bariatric Fusion + iron  1x/day AND Calcium /vit D3, Potassium pills    Current Outpatient Medications   Medication Sig Dispense Refill    acetaminophen  (TYLENOL ) 160 mg/5 mL solution Take 31.2 mL (1,000 mg total) by mouth every six (6) hours as needed. 473 mL 0    acetaminophen  (TYLENOL ) 500 MG tablet Take 2 tablets (1,000 mg total) by mouth as needed for pain.      albuterol  HFA 90 mcg/actuation inhaler Inhale 2 puffs every six (6) hours as needed. 8 g 0    blood sugar diagnostic (ACCU-CHEK GUIDE TEST STRIPS) Strp Check sugars before meals three times for insulin  dependent type two diabetes. E11.65 100 each 11    blood-glucose sensor (DEXCOM G7 SENSOR) Devi Change sensor every 10 days. 9 each 3    bosutinib 500 mg Tab Take 1 tablet (500 mg total) by mouth once daily. Administer with food. Swallow tablet whole; do not cut, crush, break, or chew. 30 tablet 2    calcium  carbonate-vitamin D3 600 mg-20 mcg (800 unit) Tab Take 1 mg by mouth Two (2) times a day (at 8am and 12:00).      carvedilol  (COREG ) 6.25 MG tablet Take 1 tablet (6.25 mg total) by mouth two (2) times a day. 180 tablet 2    cetirizine  (ZYRTEC ) 10 MG tablet TAKE 1 TABLET BY MOUTH IN THE MORNING. 90 tablet 3    cholecalciferol, vitamin D3, (VITAMIN D3 ORAL) Take by mouth.      clonazePAM  (KLONOPIN ) 0.5 MG tablet Take 1 tablet (0.5 mg total) by mouth daily as needed. PRN      cyanocobalamin , vitamin B-12, 1,000 mcg/mL Kit Inject 1,000 mcg as directed every thirty (30) days. 3 kit 4    cyanocobalamin , vitamin B-12, 1,000 mcg/mL Kit Inject 1,000 mcg as directed every twenty-eight (28) days. 3 kit 3    dapagliflozin  propanediol (FARXIGA ) 5 mg Tab tablet Take 1 tablet (5 mg total) by mouth every morning. 90 tablet 3    divalproex  (DEPAKOTE ) 500 MG DR tablet Take 1 tablet (500 mg total) by mouth nightly.      ferrous sulfate  325 (65 FE) MG tablet Take 1 tablet (325 mg total) by mouth in the morning.      fluticasone  propionate (FLONASE ) 50 mcg/actuation nasal spray 2 sprays into each nostril daily. 16 g 0    lamoTRIgine  (LAMICTAL ) 100 MG tablet Take 1 tablet (100 mg total) by mouth daily.      lancets (ACCU-CHEK SOFTCLIX LANCETS) Misc Check sugar three times per day before meals for insulin  dependent type two diabetes.  E11.65 100 each 11    leuprolide  acetate (LUPRON  DEPOT IM) Inject into the muscle. Every 3 months      multivit-min-iron -mefolate-K1 (BARIATRIC MULTIVITAMINS) 45 mg iron -800 mcg DFE-120 mcg cap Take by mouth. Taking 1 capsule daily (Fusion brand)      norethindrone  (AYGESTIN ) 5 mg tablet Take 1 tablet (5 mg total) by mouth daily. 90 tablet 3    omeprazole  (PRILOSEC) 20 MG capsule Take 1 capsule (20 mg total) by mouth two (2) times a day. Open capsule, sprinkle granules in liquid or mix with greek yogurt to take 360 capsule 0    ondansetron  (ZOFRAN ) 4 MG tablet Take 1 tablet (4 mg total) by mouth every eight (8) hours as needed for nausea. 60 tablet  1    potassium chloride  10 MEQ ER tablet Take 2 tablets (20 mEq total) by mouth daily. 60 tablet 1    simethicone  (MYLICON) 125 MG chewable tablet Chew daily as needed.      traZODone  (DESYREL ) 100 MG tablet TAKE 2 TABLETS BY MOUTH AT BEDTIME 180 tablet 3    ursodiol  (ACTIGALL ) 300 mg capsule Take 1 capsule (300 mg total) by mouth two (2) times a day. (Patient not taking: Reported on 07/12/2024) 60 capsule 5    ziprasidone  (GEODON ) 80 MG capsule Take 1 capsule (80 mg total) by mouth in the morning and 1 capsule (80 mg total) in the evening. Take with meals.       No current facility-administered medications for this visit.     Nutrition History:   Dietary Restrictions: No known food allergies. She noted food intolerance to milk.    Gastrointestinal Issues: Diarrhea Increased gas and bloating. Takes Gas-X. Has nausea sometimes, takes Zofran .  Patient eating regular - textured foods.  Patient is having difficulty tolerating broccoli (not steamed). I have a bad day every couple of days  Food Aversions: Patient denies   Dumping Syndrome: Patient denies    Changes in Taste or Smell: Patient affirms with chicken.    Symptoms of hypoglycemia: Patient denies. Not on any diabetic medicine right now either    Hunger and Satiety: Patient mostly does not endorse hunger and appetite, will get hungry after not eating for a while, but patient endorses early satiety.      Food Safety and Access: She has SNAP/EBT benefits.     Diet Recall:   Time Intake   Breakfast Couple bites Quiche with eggs + cottage cheese OR 3 crackers   Snack (AM)    Lunch 1/2 to whole Deli meat + spinach on a carb-free wrap or bagel   Snack (PM)    Dinner    Snack (HS)    Protein sources: cottage cheese, eggs, deli meat, pudding with protein powder, bone broth, looking into isopure unflavored powder, 33g pizza - Vital (will intake half) 2x/week, cheese, interested in egg bites and smoothies with protein powder/ greek yogurt/ soymilk     Food-Related History:  Snacks: Crackers when nauseous otherwise nothing else  Beverages: Gatorade Zero, diet green tea, Minutemaid SF fruit punch + water - 80 fl oz I'll fill up on drinks if I'm not careful. Especially when I have to take my medications (12 in the AM, 10 in PM)  Cooking Methods: baked, cold items   Meal Schedule: 3 meal opportunities         Eating Behaviors:  Overeating: Denied issues with overeating.   Emotional Eating: Denied issues.   Grazing: Denied issues.   Fast Eating: working on chewing well and swallowing little bits at a time   Nighttime Eating: None.    Physical Activity:  Physical activity level is light with some exercise.   Type: I've been trying to go to the gym OR Exercise videos at home, stretching, bike + seated elliptical for a mile, seated exercises    Duration: 1 hour   Frequency: 2x/week    Daily Estimated Nutritional Needs:  Energy: 2139 kcals [Per [place]-Jeor Equation (AF of 1.0) using  , 132.9 kg (07/13/24 1400)]  Protein: 84-100 gm [1.0-1.2 gm/kg using  , 83.64 kg (07/13/24 1400)]  Carbohydrate:   [< / equal to 45% of kcal]  Fluid: 2000-3000 mL [ ]   Patient's actual BMR is likely lower than predicted  related to significant weight loss.   Nutrition Goals & Evaluation      Continued weight loss to patient's goal weight of ~200 lbs (Not Met, Ongoing, and Progressing)  Apply dietary and lifestyle habits to avoid weight regain (Ongoing and Progressing)  Meet nutritional needs (Not Met and Ongoing)    Nutrition goals reviewed, relevant barriers identified and addressed: limited PO intake. She is evaluated to have good to fair willingness and ability to achieve nutrition goals.     Nutrition Assessment       Breyanna Valera is 6 weeks s/p laparoscopic gastric bypass on 06/01/2024. She has lost 8.95 kg since surgery. She is currently tolerating regular texture foods. Recent diet history indicates sheis not meeting estimated nutrition needs due to lack of consistent nutrition opportunities. Patient would benefit from a more consistent meal schedule to minimize skipping, increase regular intake, and to increase chance of meeting daily protein goal. Patient would benefit from incorporating protein-rich snacks into day. She is sometimes meeting near goal protein intake of 60-80 grams/day. Patient has a history of selective eating and would benefit from preparing own protein drink comprised of a non-dairy base that could encourage adherence and improve meeting protein goals. She  is taking appropriate bariatric nutrition supplements. August labs noted improved triglycerides; patient will receive updated labs during NP visit 30-months post op. Patient's physical activity is light as patient is still experiencing pain and fatigue; patient was encouraged to increase frequency and intensity as tolerated.    Nutrition Intervention      - Nutrition Education: Bariatric dietary and lifestyle habits. Education resource(s) or methods provided:: After visit summary with patient instructions     Nutrition Plan:   Please see patient instructions.    Follow up will occur at 1-month post operative visit in January 2026.     Food/Nutrition-related history, Anthropometric measurements, Biochemical data, medical tests, procedures, Patient understanding or compliance with intervention and recommendations , and Effectiveness of nutrition interventions will be assessed at time of follow-up.     Recommendations for Clinical Team:  Continue to recommend following a consistent meal/snack schedule with a focus on protein. Emphasize a variety of exercise (aerobic, strength, stretching and balance) outside of daily routine should also be encouraged for 150-300 minutes per week.       Patient seen according to established protocol for bariatric care.      The patient reports they are physically located in [place]  and is currently: at home. I conducted a audio/video visit. I spent  13m 52s on the video call with the patient. I spent an additional 14 minutes on pre- and post-visit activities on the date of service .     I am located on-site and the patient is located off-site for this visit.

## 2024-07-13 DIAGNOSIS — C9210 Chronic myeloid leukemia, BCR/ABL-positive, not having achieved remission: Principal | ICD-10-CM

## 2024-07-13 NOTE — Unmapped (Signed)
 Per our discussion on the phone these labs are looking better.  Cholesterol and triglycerides are down.  Triglycerides are almost in normal range.  This is very good.  A1c is 5.5.  You are in normal range with your A1c.  Potassium is still slightly low at 3.3 but much improved from 2.9.  I would continue the potassium supplementation as you have been doing until you see Dr. Garnett Agreste next week and can have your potassium rechecked there.  Pancreas labs amylase and lipase are normal.  No indication of acute pancreatitis.  Thyroid lab is in normal range.  Still waiting on vitamin D which will result in a week.  Also waiting on the stool studies.  Please try and get those samples to the lab later on today.

## 2024-07-13 NOTE — Unmapped (Addendum)
 GOALS:     1.  Continue following consistent meal schedule: remember to eat protein every 3-4 hours                          - include protein at every meal and every snack                          -  aim for 2-3 servings of vegetables per day                           - aim for 1-2 servings of whole fruit per day                          - may include small servings of whole grains at meals/snacks     PROTEIN OPTIONS: chicken/beef/pork, fish/tuna, eggs, cheese, cottage cheese, [place] yogurt, deli meat, protein shakes, beans, nuts/seeds, peanut butter              * Nuts and peanut butter are restricted to one serving once per day.                          1/4 cup nuts OR 2 Tbsp peanut butter    Potential/preferred protein sources: cottage cheese, eggs, deli meat, pudding with protein powder, bone broth, looking into isopure unflavored powder, 33g pizza - Vital (will intake half) 2x/week, cheese, interested in egg bites and smoothies with protein powder, greek yogurt/soymilk, etc.    2.  EXERCISE: Do activity outside of daily routine 3x/wk for 30-45 minutes  - do activities you enjoy to establish consistency  - increase duration/frequency/intensity as tolerated  - aim for variety in routine with a mix of aerobic, strength, stretching and balance  - GOAL: 150-300 minutes per week      3. Continue to adhere to multivitamin regimen to decrease risk of micronutrient deficiency.    4. Continue to find high-protein options/recipes to incorporate in diet    5. Find set times to take medications before meals - preferably 1-2 before eating to have room for food   EXAMPLE:   10:00am: breakfast - include protein   1:00pm: lunch - protein + non-starchy vegetables   3:00pm: protein based snack   5:00pm: protein based snack   7:30pm: dinner - protein + non-starchy vegetables

## 2024-07-14 DIAGNOSIS — C9210 Chronic myeloid leukemia, BCR/ABL-positive, not having achieved remission: Principal | ICD-10-CM

## 2024-07-14 LAB — VITAMIN D 25 HYDROXY: VITAMIN D, TOTAL (25OH): 57.1 ng/mL (ref 20.0–80.0)

## 2024-07-15 ENCOUNTER — Encounter
Admit: 2024-07-15 | Discharge: 2024-07-16 | Payer: Medicaid (Managed Care) | Attending: Ambulatory Care | Primary: Ambulatory Care

## 2024-07-15 DIAGNOSIS — Z794 Long term (current) use of insulin: Principal | ICD-10-CM

## 2024-07-15 DIAGNOSIS — E1122 Type 2 diabetes mellitus with diabetic chronic kidney disease: Principal | ICD-10-CM

## 2024-07-15 DIAGNOSIS — N1831 Type 2 diabetes mellitus with stage 3a chronic kidney disease, with long-term current use of insulin: Principal | ICD-10-CM

## 2024-07-15 NOTE — Unmapped (Addendum)
 [facility] at [facility]  [address]  [place], [place] [address]    Clinical Pharmacist Visit Summary    The patient reports they are physically located in [place]  and is currently: at home. I conducted a audio/video visit. I spent  36m 45s on the video call with the patient. I spent an additional 5 minutes on pre- and post-visit activities on the date of service.     Assessment and Plan:   1. Diabetes, type 2: good control.  Last A1c = 5.5% on 07/12/24 with goal <7% without hypoglycemia.   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   Takes Farxiga  5mg  po daily with good adherence and without adverse effects or affordability concerns. This medication is prescribed by her cardiologist and is her only glucose lowering medication.   Patient is continuing to follow her bariatric surgeon's teams recommendations regarding diet and exercise.   With shared decision making, will continue patient's current regimen. Instructed patient to follow up with us  as needed if changes arise in her A1c and glucose control. She verbalized understanding and agreement to the following plan:    CONTINUE Farxiga  5mg  PO daily.   Reviewed signs/symptoms/treatment of hypoglycemia.   Follow up with clinical pharmacist as needed for medication management.    Follow up with Dr. Sheree Garnett Schooner MD on 07/19/24.     Jolee Molt, PharmD  PGY1 Ambulatory Care Resident     I have reviewed the resident's note and agree with the assessment and plan.     Arlean EMERSON Andrews, PharmD, CPP, BCACP, CDCES    I spent a total of 8 minutes on audio-video with the patient delivering clinical care and providing education/counseling.     Subjective:   Reason for visit: Follow up care with Clinical Pharmacist Practitioner for blood glucose review and adjustment of diabetes regimen as needed.    Jenna Mosley is a 33 y.o. year old female with a history of diabetes type 2 who presents today for a diabetes-related visit. PMH includes FLD, [name], bipolar disorder, [name], Pulmonary hypertension, [name].    Diabetes Provider and Last Visit Date:  Dr. Sheree Garnett Schooner MD on 06/07/24    At Last Visit:   Continue to monitor glucose. If above 180 persistently, please let me know.   Close monitoring of glucose with CGM   Repeat A1c in 3 months.  Close monitoring in 6 weeks     Interval History of Present Illness:   Today, patient presents virtually for diabetes follow up. She reports adherence to Farxiga  5mg  by mouth daily for CV benefit. 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 Denies missed doses and adverse effects. Denies affordability concerns (has [place] Medicaid insurance). Is not regularly checking blood glucose levels and no longer using CGM. Denies hypoglycemia or BG <70 mg/dl. Diet and exercise as documented below. Discussed the importance of medication adherence, diet, and exercise to improve glycemic control. Overall, patient is doing well from a diabetes and weight management standpoint. She had no additional questions or concerns.     Diet (Typical): Not discussed     Exercise: Not discussed     Social History: Not discussed     POC glucose level today of N/A virtual visit.    Glucose Monitoring: Patient is not currently measuring blood sugar levels     Hypoglycemia:  Symptoms of hypoglycemia since last visit: no   Prior recognition of hypoglycemia symptoms and knowledge of treatment: yes     Current Medications: Reports adherence to the following diabetes medications:   Farxiga  5mg  tablet by mouth daily - prescribed by cardiologist     Previous Medications: Victoza , metformin , Humulin , Mounjaro       CARDIOVASCULAR RISK REDUCTION  History of clinical ASCVD? no  History of heart failure? yes  History of hyperlipidemia? yes  Taking statin? no  Taking aspirin? no  Taking SGLT-2i and/or GLP- 1 RA? yes, Farxiga  5 mg daily; was on Mounjaro  but this was recently discontinued due to bariatric surgery.      BLOOD PRESSURE CONTROL  History of hypertension?: yes  Taking ACEi/ARB? no, severe allergy  to lisinopril     KIDNEY CARE  History of Chronic Kidney Disease? yes  History of albuminuria? yes, last UACR = unable to calculate on 12/17/2023 but has a history  Taking SGLT-2i and/or GLP- 1 RA? As above  Taking ACEi/ARB? As above    Current Medications[1]    Objective:   Vitals:  There were no vitals filed for this visit.    Past Medical History:    Active Ambulatory Problems     Diagnosis Date Noted    Diarrhea 11/15/2008    Esophageal reflux 08/15/2008    Essential (primary) hypertension 09/08/2006    Polycystic ovarian syndrome 09/28/2016    Bipolar I disorder: With psychotic features, Current or most recent episode depressed, with mixed features (CMS-HCC) 06/15/2017    Anxiety disorder 06/15/2017    Decreased hearing of both ears 09/11/2017    Seborrheic dermatitis of scalp 09/11/2017    Pelvic mass 06/23/2019    Healthcare maintenance 08/03/2020    H/O non anemic vitamin B12 deficiency 08/03/2020    Schizo affective schizophrenia         PTSD (post-traumatic stress disorder) 10/08/2021    BMI 39.0-39.9,adult 11/19/2020    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    Elevated platelet count 02/06/2021    Type 2 diabetes mellitus with diabetic neuropathy, with long-term current use of insulin      03/27/2020    [name] (chronic myelocytic leukemia)     02/19/2021    NASH (nonalcoholic steatohepatitis) 02/19/2021    Focal nodular hyperplasia of liver 03/05/2021    Drug-induced constipation 07/25/2021    At risk for prolonged QT interval syndrome 10/08/2021    Cow's milk intolerance 07/08/2019    Gastroparesis 08/26/2022    Insomnia 08/26/2022    Prominent metatarsal head of left foot 08/26/2022    Low vitamin D level 08/26/2022    Primary stabbing headache 12/03/2022    Restrictive airway disease 01/09/2023    Brain atrophy 01/09/2023    Shortness of breath 01/15/2023    Pulmonary hypertension     02/19/2023    Chronic renal impairment, stage 3a (CMS-HCC) 05/06/2023    Allergic reaction to Augmentin  05/06/2023    (HFpEF) heart failure with preserved ejection fraction     05/06/2023    Onycholysis of toenail 11/24/2023    Low vitamin B12 level 12/31/2023    Urgency of urination 05/17/2024    Hypokalemia 07/07/2024    Bloating 07/12/2024    Family history of breast cancer 07/12/2024     Resolved Ambulatory Problems     Diagnosis Date Noted    Borderline personality disorder     06/03/2011    Depressive disorder 10/04/2004    Diabetes mellitus without complication     04/22/2011  Iron  deficiency anemia 02/03/2011    Abnormal Pap smear 09/28/2012    Morbid obesity (CMS-HCC) 04/08/2006    Cervicalgia 11/03/2012    Obstructive sleep apnea 02/20/2010    Mononeuritis of lower limb 02/17/2011    B-complex deficiency 08/18/2012    Peripheral neuropathy 03/14/2013    B12 deficiency anemia     Pyelonephritis 06/10/2013    Malaise 09/02/2013    Menorrhagia 11/09/2013    Skin tag 11/09/2013    Breast lump 11/23/2013    Lower urinary tract infectious disease 11/23/2013    Lesion of liver 12/21/2013    Fibrocystic breast 01/24/2014    Diarrhea 03/06/2014    Vomiting 03/15/2014    Parotitis 03/30/2014    Migraine 07/14/2014    Urinary frequency 08/07/2014    Fullness in ear 08/07/2014    Cystitis 09/11/2014    Episodic tension-type headache 09/11/2014    Neck pain 01/04/2015    Palpitation 01/09/2015    Vaginal discharge 01/22/2016    Screen for STD (sexually transmitted disease) 01/22/2016    Mass of right lower extremity 02/22/2016    Chronic fatigue 02/22/2016    Rash and nonspecific skin eruption 04/29/2016    Chronic pain of right knee 08/29/2016    Acute cystitis without hematuria 01/05/2017    Bipolar II Disorder 02/10/2017    Foot infection 08/24/2017    Atypical chest pain 10/15/2017    Impetigo 10/15/2017    Abdominal pain, RUQ 01/08/2018    Chronic diarrhea 01/25/2018 Ringworm of body 05/28/2018    Polycystic ovaries 08/05/2018    Dysuria 06/23/2019    Morbid obesity with body mass index (BMI) of 50.0 to 59.9 in adult (CMS-HCC) 01/02/2020    Acute renal failure 03/30/2020    Sepsis     04/07/2020    Encounter to establish care 04/27/2020    Flu vaccine need 08/03/2020    Diarrhea 08/03/2020    Urinary frequency 02/04/2021    Uncontrolled type 2 diabetes mellitus with hyperglycemia     02/19/2021    Toenail deformity 05/03/2021    Encounter for weight loss counseling 12/04/2021    Inappropriate diet and eating habits 05/15/2022    Acute viral syndrome 08/26/2022    Low back strain 02/28/2020    Chronic midline low back pain without sciatica 08/26/2022    Diabetic ulcer of toe of right foot associated with type 2 diabetes mellitus, with muscle involvement without evidence of necrosis     08/26/2022    Impacted cerumen of right ear 12/03/2022    AKI (acute kidney injury) 01/01/2023    Dog bite, hand, unspecified laterality, sequela 05/06/2023    Acute pain of left shoulder 05/17/2024     Past Medical History:   Diagnosis Date    Anemia     Anxiety     Arthritis     Binge eating     Bipolar disorder         Cancer         Chronic kidney disease     Diabetes mellitus         Fatty liver     GERD (gastroesophageal reflux disease)     Hyperlipidemia     Hypertension     Major depressive disorder     Obesity     Obsessive-compulsive disorder     Panic disorder     Prior Outpatient Treatment/Testing 06/15/2017    Psychiatric Hospitalizations 06/15/2017    Psychiatric Medication Trials 06/15/2017    Pulmonary  arterial hypertension         Restrictive lung disease     Self-injurious behavior 06/15/2017    Suicidal ideation 06/15/2017       Wt Readings from Last 3 Encounters:   07/12/24 (!) 133.6 kg (294 lb 9.6 oz)   07/13/24 (!) 132.9 kg (293 lb)   07/07/24 (!) 132.9 kg (292 lb 15.9 oz)       Lab Results   Component Value Date    A1C 5.5 07/12/2024    A1C 5.7 (H) 03/25/2024    A1C 6.6 02/02/2024    A1C 6.6 10/30/2023    A1C 7.3 (H) 07/17/2023    A1C 7.2 (H) 07/17/2023    A1C 10.6 (H) 03/10/2023    A1C 8.2 (H) 12/03/2022       Lab Results   Component Value Date    NA 145 07/12/2024    K 3.3 (L) 07/12/2024    CL 106 07/12/2024    CO2 26.0 07/12/2024    BUN 5 (L) 07/12/2024    CREATININE 0.81 07/12/2024    GFR >= 60 02/11/2013    GLU 92 07/12/2024    CALCIUM  9.2 07/12/2024    ALBUMIN 3.6 07/12/2024    PHOS 4.1 06/09/2024       Lab Results   Component Value Date    ALKPHOS 165 (H) 07/12/2024    BILITOT 0.3 07/12/2024    BILIDIR 0.10 12/31/2022    PROT 7.0 07/12/2024    ALBUMIN 3.6 07/12/2024    ALT <7 (L) 07/12/2024    AST 15 07/12/2024       Albumin/Creatinine Ratio (no units)   Date Value   12/17/2023      Comment:     Unable to calculate.       Lab Results   Component Value Date    CHOL 147 07/12/2024    CHOL 215 (H) 07/31/2023    CHOL 166 08/26/2022     Lab Results   Component Value Date    HDL 36 (L) 07/12/2024    HDL 28 (L) 07/31/2023    HDL 30 (L) 08/26/2022     Lab Results   Component Value Date    LDL 83 07/12/2024    LDL  90/86/7975      Comment:      Unable to calculate due to triglyceride greater than 400 mg/dL.    LDL 89.0 07/31/2023     Lab Results   Component Value Date    VLDL  07/31/2023      Comment:      Unable to calculate due to triglyceride greater than 400 mg/dL.    VLDL 54.8 (H) 08/26/2022    VLDL 64.4 (H) 05/14/2021     Lab Results   Component Value Date    CHOLHDLRATIO 7.7 (H) 07/31/2023    CHOLHDLRATIO 5.5 (H) 08/26/2022    CHOLHDLRATIO 4.9 (H) 05/14/2021     Lab Results   Component Value Date    TRIG 186 (H) 07/12/2024    TRIG 721 (H) 07/31/2023    TRIG 274 (H) 08/26/2022       The ASCVD Risk score (Arnett DK, et al., 2019) failed to calculate for the following reasons:    The 2019 ASCVD risk score is only valid for ages 71 to 46    Note: For patients with SBP <90 or >200, Total Cholesterol <130 or >320, HDL <20 or >100 which are outside of the allowable range, the calculator will use  these upper or lower values to calculate the patient???s risk score.              [1]   Current Outpatient Medications:     acetaminophen  (TYLENOL ) 160 mg/5 mL solution, Take 31.2 mL (1,000 mg total) by mouth every six (6) hours as needed., Disp: 473 mL, Rfl: 0    acetaminophen  (TYLENOL ) 500 MG tablet, Take 2 tablets (1,000 mg total) by mouth as needed for pain., Disp: , Rfl:     albuterol  HFA 90 mcg/actuation inhaler, Inhale 2 puffs every six (6) hours as needed., Disp: 8 g, Rfl: 0    blood sugar diagnostic (ACCU-CHEK GUIDE TEST STRIPS) Strp, Check sugars before meals three times for insulin  dependent type two diabetes. E11.65, Disp: 100 each, Rfl: 11    blood-glucose sensor (DEXCOM G7 SENSOR) Devi, Change sensor every 10 days., Disp: 9 each, Rfl: 3    bosutinib 500 mg Tab, Take 1 tablet (500 mg total) by mouth once daily. Administer with food. Swallow tablet whole; do not cut, crush, break, or chew., Disp: 30 tablet, Rfl: 2    calcium  carbonate-vitamin D3 600 mg-20 mcg (800 unit) Tab, Take 1 mg by mouth Two (2) times a day (at 8am and 12:00)., Disp: , Rfl:     carvedilol  (COREG ) 6.25 MG tablet, Take 1 tablet (6.25 mg total) by mouth two (2) times a day., Disp: 180 tablet, Rfl: 2    cetirizine  (ZYRTEC ) 10 MG tablet, TAKE 1 TABLET BY MOUTH IN THE MORNING., Disp: 90 tablet, Rfl: 3    cholecalciferol, vitamin D3, (VITAMIN D3 ORAL), Take by mouth., Disp: , Rfl:     clonazePAM  (KLONOPIN ) 0.5 MG tablet, Take 1 tablet (0.5 mg total) by mouth daily as needed. PRN, Disp: , Rfl:     cyanocobalamin , vitamin B-12, 1,000 mcg/mL Kit, Inject 1,000 mcg as directed every thirty (30) days., Disp: 3 kit, Rfl: 4    cyanocobalamin , vitamin B-12, 1,000 mcg/mL Kit, Inject 1,000 mcg as directed every twenty-eight (28) days., Disp: 3 kit, Rfl: 3    dapagliflozin  propanediol (FARXIGA ) 5 mg Tab tablet, Take 1 tablet (5 mg total) by mouth every morning., Disp: 90 tablet, Rfl: 3    divalproex  (DEPAKOTE ) 500 MG DR tablet, Take 1 tablet (500 mg total) by mouth nightly., Disp: , Rfl:     ferrous sulfate  325 (65 FE) MG tablet, Take 1 tablet (325 mg total) by mouth in the morning., Disp: , Rfl:     fluticasone  propionate (FLONASE ) 50 mcg/actuation nasal spray, 2 sprays into each nostril daily., Disp: 16 g, Rfl: 0    lamoTRIgine  (LAMICTAL ) 100 MG tablet, Take 1 tablet (100 mg total) by mouth daily., Disp: , Rfl:     lancets (ACCU-CHEK SOFTCLIX LANCETS) Misc, Check sugar three times per day before meals for 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 Every 3 months, Disp: , Rfl:     multivit-min-iron -mefolate-K1 (BARIATRIC MULTIVITAMINS) 45 mg iron -800 mcg DFE-120 mcg cap, Take by mouth. Taking 1 capsule daily (Fusion brand), Disp: , Rfl:     norethindrone  (AYGESTIN ) 5 mg tablet, Take 1 tablet (5 mg total) by mouth daily., Disp: 90 tablet, Rfl: 3    omeprazole  (PRILOSEC) 20 MG capsule, Take 1 capsule (20 mg total) by mouth two (2) times a day. Open capsule, sprinkle granules in liquid or mix with greek yogurt to take, Disp: 360 capsule, Rfl: 0  ondansetron  (ZOFRAN ) 4 MG tablet, Take 1 tablet (4 mg total) by mouth every eight (8) hours as needed for nausea., Disp: 60 tablet, Rfl: 1    potassium chloride  10 MEQ ER tablet, Take 2 tablets (20 mEq total) by mouth daily., Disp: 60 tablet, Rfl: 1    simethicone  (MYLICON) 125 MG chewable tablet, Chew daily as needed., Disp: , Rfl:     traZODone  (DESYREL ) 100 MG tablet, TAKE 2 TABLETS BY MOUTH AT BEDTIME, Disp: 180 tablet, Rfl: 3    ursodiol  (ACTIGALL ) 300 mg capsule, Take 1 capsule (300 mg total) by mouth two (2) times a day. (Patient not taking: Reported on 07/12/2024), Disp: 60 capsule, Rfl: 5    ziprasidone  (GEODON ) 80 MG capsule, Take 1 capsule (80 mg total) by mouth in the morning and 1 capsule (80 mg total) in the evening. Take with meals., Disp: , Rfl:

## 2024-07-18 DIAGNOSIS — C9210 Chronic myeloid leukemia, BCR/ABL-positive, not having achieved remission: Principal | ICD-10-CM

## 2024-07-18 NOTE — Unmapped (Unsigned)
 Seen last week - today sore on pubis - focused problem visit only

## 2024-07-19 ENCOUNTER — Ambulatory Visit: Admit: 2024-07-19 | Discharge: 2024-07-20 | Payer: Medicaid (Managed Care)

## 2024-07-19 DIAGNOSIS — R809 Proteinuria, unspecified: Principal | ICD-10-CM

## 2024-07-19 DIAGNOSIS — E66813 Class 3 obesity: Principal | ICD-10-CM

## 2024-07-19 DIAGNOSIS — N1831 Type 2 diabetes mellitus with stage 3a chronic kidney disease, with long-term current use of insulin    (CMS-HCC): Principal | ICD-10-CM

## 2024-07-19 DIAGNOSIS — E1122 Type 2 diabetes mellitus with diabetic chronic kidney disease: Principal | ICD-10-CM

## 2024-07-19 DIAGNOSIS — Z794 Long term (current) use of insulin: Principal | ICD-10-CM

## 2024-07-19 DIAGNOSIS — C9210 Chronic myeloid leukemia, BCR/ABL-positive, not having achieved remission: Principal | ICD-10-CM

## 2024-07-19 LAB — BASIC METABOLIC PANEL
ANION GAP: 15 mmol/L — ABNORMAL HIGH (ref 5–14)
BLOOD UREA NITROGEN: 6 mg/dL — ABNORMAL LOW (ref 9–23)
BUN / CREAT RATIO: 7
CALCIUM: 9.1 mg/dL (ref 8.7–10.4)
CHLORIDE: 102 mmol/L (ref 98–107)
CO2: 30.5 mmol/L (ref 20.0–31.0)
CREATININE: 0.88 mg/dL (ref 0.55–1.02)
EGFR CKD-EPI (2021) FEMALE: 88 mL/min/1.73m2 (ref >=60)
GLUCOSE RANDOM: 102 mg/dL (ref 70–179)
POTASSIUM: 2.8 mmol/L — ABNORMAL LOW (ref 3.4–4.8)
SODIUM: 147 mmol/L — ABNORMAL HIGH (ref 135–145)

## 2024-07-19 MED ADMIN — leuprolide (LUPRON) injection 11.25 mg: 11.25 mg | INTRAMUSCULAR | @ 17:00:00 | Stop: 2024-07-19

## 2024-07-19 NOTE — Unmapped (Signed)
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 ) 100 MG tablet, Take 1 tablet (100 mg total) by mouth daily., Disp: ,  Rfl:     lancets (ACCU-CHEK SOFTCLIX LANCETS) Misc, Check sugar three times per day before meals for 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 Every 3 months, Disp: , Rfl:     multivit-min-iron -mefolate-K1 (BARIATRIC MULTIVITAMINS) 45 mg iron -800 mcg DFE-120 mcg cap, Take by mouth. Taking 1 capsule daily (Fusion brand), Disp: , Rfl:     norethindrone  (AYGESTIN ) 5 mg tablet, Take 1 tablet (5 mg total) by mouth daily., Disp: 90 tablet, Rfl: 3    omeprazole  (PRILOSEC) 20 MG capsule, Take 1 capsule (20 mg total) by mouth two (2) times a day. Open capsule, sprinkle granules in liquid or mix with greek yogurt to take, Disp: 360 capsule, Rfl: 0    ondansetron  (ZOFRAN ) 4 MG tablet, Take 1 tablet (4 mg total) by mouth every eight (8) hours as needed for nausea., Disp: 60 tablet, Rfl: 1    potassium chloride  10 MEQ ER tablet, Take 2 tablets (20 mEq total) by mouth daily., Disp: 60 tablet, Rfl: 1    simethicone  (MYLICON) 125 MG chewable tablet, Chew daily as needed., Disp: , Rfl:     traZODone  (DESYREL ) 100 MG tablet, TAKE 2 TABLETS BY MOUTH AT BEDTIME, Disp: 180 tablet, Rfl: 3    ursodiol  (ACTIGALL ) 300 mg capsule, Take 1 capsule (300 mg total) by mouth two (2) times a day. (Patient not taking: Reported on 07/12/2024), Disp: 60 capsule, Rfl: 5    ziprasidone  (GEODON ) 80 MG capsule, Take 1 capsule (80 mg total) by mouth in the morning and 1 capsule (80 mg total) in the evening. Take with meals., Disp: , Rfl:   No current facility-administered medications for this visit.       Objective:     Physical exam:  Vitals:    07/19/24 1614   BP: 156/91   Pulse: 59         Wt Readings from Last 3 Encounters:   07/19/24 (!) 127 kg (280 lb)   07/12/24 (!) 133.6 kg (294 lb 9.6 oz)   07/13/24 (!) 132.9 kg (293 lb)     BMI Readings from Last 3 Encounters:   07/19/24 37.97 kg/m??   07/12/24 39.95 kg/m??   07/13/24 39.73 kg/m??      GEN: appears well, in NAD  HEENT: sclerae anicteric  NECK:  no visible neck mass or deformity  CHEST: normal breathing chest movements  NEURO: Aox3, following commands  PSYCH: normal affect.  SKIN: no visible rash         Labs reviewed:  Lab Results   Component Value Date    A1C 5.5 07/12/2024       Lab Results   Component Value Date    NA 145 07/12/2024    K 3.3 (L) 07/12/2024    CL 106 07/12/2024    CO2 26.0 07/12/2024    BUN 5 (L) 07/12/2024    CREATININE 0.81 07/12/2024    GFR >= 60 02/11/2013    GLU 92 07/12/2024    CALCIUM  9.2 07/12/2024    ALBUMIN 3.6 07/12/2024    PHOS 4.1 06/09/2024       Lab Results   Component Value Date    TSH 0.588 07/12/2024

## 2024-07-19 NOTE — Unmapped (Signed)
 Patient arrived in the clinic for injection. Lupron  was administered as ordered without complications, area covered with a band-aid. Patient was instructed to stop by the check out desk to complete today's visit and to receive a printed after visit summary then discharged home to self care.

## 2024-07-19 NOTE — Unmapped (Signed)
 No Meter and pump downloaded. POC glucose and A1C done today. PP 88. 2pm mg/dL.

## 2024-07-19 NOTE — Unmapped (Signed)
 I was the supervising physician in the delivery of the service. Nila Nephew, MD

## 2024-07-20 ENCOUNTER — Ambulatory Visit: Admit: 2024-07-20 | Discharge: 2024-07-21 | Payer: Medicaid (Managed Care)

## 2024-07-20 ENCOUNTER — Encounter
Admit: 2024-07-20 | Discharge: 2024-07-21 | Payer: Medicaid (Managed Care) | Attending: Geriatric Medicine | Primary: Geriatric Medicine

## 2024-07-20 DIAGNOSIS — C9210 Chronic myeloid leukemia, BCR/ABL-positive, not having achieved remission: Principal | ICD-10-CM

## 2024-07-20 MED ORDER — POTASSIUM CHLORIDE ER 10 MEQ TABLET,EXTENDED RELEASE(PART/CRYST)
ORAL_TABLET | Freq: Every day | ORAL | 3 refills | 90.00000 days | Status: CP
Start: 2024-07-20 — End: ?

## 2024-07-20 MED ORDER — NORETHINDRONE ACETATE 5 MG TABLET
ORAL_TABLET | Freq: Every day | ORAL | 3 refills | 90.00000 days | Status: CP
Start: 2024-07-20 — End: ?

## 2024-07-21 NOTE — Unmapped (Signed)
 Still waiting on additional tests but so far no evidence of clostridium difficile or any other infection of your bowels.

## 2024-07-22 ENCOUNTER — Ambulatory Visit
Admit: 2024-07-22 | Discharge: 2024-07-23 | Payer: Medicaid (Managed Care) | Attending: Student in an Organized Health Care Education/Training Program | Primary: Student in an Organized Health Care Education/Training Program

## 2024-07-22 DIAGNOSIS — R1084 Generalized abdominal pain: Principal | ICD-10-CM

## 2024-07-22 DIAGNOSIS — Z9884 Bariatric surgery status: Principal | ICD-10-CM

## 2024-07-22 MED ORDER — POTASSIUM CHLORIDE 40 MEQ/15 ML ORAL LIQUID
Freq: Every day | ORAL | 3 refills | 62.00000 days | Status: CP
Start: 2024-07-22 — End: ?

## 2024-07-22 MED ORDER — SUCRALFATE 100 MG/ML ORAL SUSPENSION
Freq: Three times a day (TID) | ORAL | 0 refills | 30.00000 days | Status: CP
Start: 2024-07-22 — End: 2024-08-21

## 2024-07-22 NOTE — Unmapped (Addendum)
-  Stop your Farxiga     - Call your PCP to change your potassium to a liquid and follow-up with your potassium levels.    -Start Carafate  liquid TD    -Restart your bariatric multivitamins ASAP    -Improve your protein intake!!  Go slow. Pretend like you are immediately post-op.    -Try sugar free liquid IV for improvement with dehydration and your potassium levels    - Increase your water intake to accommodate for your increased diarrhea    - It is ok to begin using immodium (liquid) 2mg  three times daily, as needed for your diarrhea    -Take 200mg  magnesium  oxide daily    If you do not improve in 1 week, call [facility] to discuss pre-admission. Otherwise, return to clinic in 2 weeks.    If you have any questions regarding your visit today please feel free to contact me at the number listed below.     [facility] Gaskins, RN, Bariatric Coordinator  Surgical RN Coordinator for Dr. Annitta Manes   [email or web address]  Phone:  [phone]  Fax:       [phone]

## 2024-07-22 NOTE — Unmapped (Addendum)
 Outpatient Postoperative Note    PRIMARY CARE PROVIDER: [name], MD     REFERRING PROVIDER: [name], MD  [address]  [address]  [place],  [place] [address]     Subjective:      Jenna Mosley is a 33 y.o. female who is here today for a 3 week follow-up visit following laparoscopic gastric bypass.  Surgery was performed on 06/01/24. She was readmitted on 06/08/24 for nausea, vomiting, and chest pain. Cardiac work up negative, seen by cardiology with no further intervention.     She endorses nausea, vomiting, diarrhea (10-15 episodes/day), lower abdominal pain, and gas pain. She states she has n/v every day, she has difficulty tolerating certain pills, especially the potassium pills. She states most days she is not able to get all pills down. She has very poor intake, only tolerating eating saltine crackers some days, drinks 2 40oz Stanley's per day and multiple gatorades per day. She states she recently purchased a blender and is going to try to do protein shakes with fruit. Patient states her diarrhea does not have fat containing content but endorses a large amount each time she has a bowel movement.     Most recent K was 2.8 on 9/4. Patient endorses multiple fall episodes due to losing her balance.       Objective:     BP 153/82 (BP Site: L Arm, BP Position: Sitting)  - Pulse 62  - Temp 36.3 ??C (97.3 ??F) (Skin)  - Resp 15  - Ht 182.9 cm (6')  - Wt (!) 126.1 kg (278 lb)  - SpO2 100%  - BMI 37.70 kg/m??     Wt Readings from Last 6 Encounters:   07/22/24 (!) 126.1 kg (278 lb)   07/19/24 (!) 127 kg (280 lb)   07/12/24 (!) 133.6 kg (294 lb 9.6 oz)   07/13/24 (!) 132.9 kg (293 lb)   07/07/24 (!) 132.9 kg (292 lb 15.9 oz)   06/30/24 (!) 135.7 kg (299 lb 1.6 oz)    06/07/24 132.5kg (292 lb)    Physical Exam:    General: Unremarkable.   HEENT: No scleral icterus, EOM intact.  Lungs: Clear.  Heart: RRR.  Abdomen: Soft, non-tender, nondistended, incision sites healing without fluctuance or cellulitis.  Extremities: Benign.  Neuro: Grossly nonfocal.    Assessment & Plan:   [name] is a 32F s/p gastric bypass 06/01/2024 who's postop course has been complicated by nausea, vomiting, many episodes of diarrhea, lower abdominal pain, gas pain, PO intolerance, and pill intolerance.     Patient endorses tolerating 2 40oz bottles of water each day as well as multiple gatorades, however with patient's many episodes of diarrhea there is concern for dehydration. Malnutrition is also a concern with her poor PO intake as well as intolerance to multiple vitamins.     Her low K should be closely monitored by her PCP which could be causing the loss of balance and multiple falls.     Plans:   - Stop Farxiga  due to poor PO intake, continued n/v, concern for dehydration and malnutrition   - discuss with PCP to change potassium pill to liquid and closely monitor K levels.   - Start Carafate  liquid TID   - Resume bariatric multivitamins ASAP  - improve protein intake   - Sugar free liquid IV or other electrolyte drinks with low sugar  - use immodium (liquid) 2mg  TID PRN for diarrhea   - 200mg   magnesium  oxide daily   - If no improvement in 1 week - call clinic for pre-admission; otherwise follow up in clinic in 2 weeks     Quincy Free, MD  General Surgery- PGY1  [phone]    Attestation    I saw and evaluated the patient, participating in the key portions of the service.  I reviewed the resident???s note.  I agree with the resident???s findings and plan.     [name], MD, MPH  07/29/2024  5:57 PM

## 2024-07-25 DIAGNOSIS — R197 Diarrhea, unspecified: Principal | ICD-10-CM

## 2024-07-25 NOTE — Unmapped (Signed)
 I ordered stool studies to assess patient's diarrhea. fecal fat is elevated concerning for pancreatic insufficiency (could also be bile duct obstruction but last ct did not show). still waiting on a fecal elastase. any thoughts on starting creon and any concerns on dosing status post gastric bypass.

## 2024-07-26 DIAGNOSIS — C9210 Chronic myeloid leukemia, BCR/ABL-positive, not having achieved remission: Principal | ICD-10-CM

## 2024-07-26 MED ORDER — CYANOCOBALAMIN (VIT B-12) 1,000 MCG/ML INJECTION KIT
PACK | INTRAMUSCULAR | 3 refills | 0.00000 days
Start: 2024-07-26 — End: ?

## 2024-07-26 MED ORDER — NORETHINDRONE ACETATE 5 MG TABLET
ORAL_TABLET | Freq: Every day | ORAL | 3 refills | 90.00000 days
Start: 2024-07-26 — End: ?

## 2024-07-26 NOTE — Unmapped (Signed)
Pancreatic elastase normal.

## 2024-07-27 DIAGNOSIS — C9210 Chronic myeloid leukemia, BCR/ABL-positive, not having achieved remission: Principal | ICD-10-CM

## 2024-07-27 MED ORDER — NORETHINDRONE ACETATE 5 MG TABLET
ORAL_TABLET | Freq: Every day | ORAL | 3 refills | 90.00000 days | Status: CP
Start: 2024-07-27 — End: ?

## 2024-07-27 MED ORDER — CYANOCOBALAMIN (VIT B-12) 1,000 MCG/ML INJECTION KIT
PACK | INTRAMUSCULAR | 3 refills | 0.00000 days | Status: CP
Start: 2024-07-27 — End: ?

## 2024-07-27 NOTE — Unmapped (Signed)
 Adult Refill: Patient is requesting the following refill  Requested Prescriptions     Pending Prescriptions Disp Refills    cyanocobalamin , vitamin B-12, 1,000 mcg/mL Kit 3 kit 3     Sig: Inject 1,000 mcg as directed every twenty-eight (28) days.       Recent Visits  Date Type Provider Dept   07/12/24 Office Visit Erdem, Grady Gear, MD [facility] Internal Medicine [facility] [place]   05/17/24 Office Visit Cleotilde, Beverley Leisure, MD [facility] Internal Medicine [facility] [place]   12/31/23 Office Visit Erdem, Grady Gear, MD [facility] Internal Medicine [facility] [place]   11/24/23 Office Visit [name], MD [facility] Internal Medicine [facility] [place]   11/06/23 Office Visit Trenda Core, MD [facility] Internal Medicine [facility]   Showing recent visits within past 365 days and meeting all other requirements  Future Appointments  No visits were found meeting these conditions.  Showing future appointments within next 365 days and meeting all other requirements       Labs: Vitals:   BP Readings from Last 3 Encounters:   07/22/24 153/82   07/19/24 156/91   07/12/24 128/72    and   Pulse Readings from Last 3 Encounters:   07/22/24 62   07/19/24 59   07/12/24 60    Vitamin B12:    Vitamin B-12   Date Value   11/26/2023 285 pg/ml   09/02/2013 >1000 pg/mL (H)    OBG: Menstrual History:  OB History       Gravida   0    Para        Term   0    Preterm        AB        Living             SAB        IAB        Ectopic        Molar        Multiple        Live Births              Obstetric Comments                     No LMP recorded. (Menstrual status: Other).

## 2024-07-27 NOTE — Unmapped (Signed)
 Nurse informed patient that Dr. Robinette sent in the prescription for norethindrone  to [facility] in [place] on [address]. Patient to reach out to the pharmacy and will contact Dr. Robinette if there is any issue picking it up.

## 2024-07-28 MED ORDER — SYRINGE-NEEDLE,SAFETY,SELF-CONT. DISPOSAL UNIT 1 ML 27 GAUGE X 1/2"
1 refills | 0.00000 days | Status: CP
Start: 2024-07-28 — End: ?

## 2024-07-28 MED ORDER — CYANOCOBALAMIN (VIT B-12) 1,000 MCG/ML INJECTION SOLUTION
INTRAMUSCULAR | 3 refills | 300.00000 days | Status: CP
Start: 2024-07-28 — End: 2024-07-28

## 2024-07-28 NOTE — Unmapped (Signed)
 [facility] at [phone] left a message. [facility] pharmacy does not carry the prefilled syringes for Vitamin B12, they only have the vials. If Dr. Robinette would like to send in a new prescription for the Vitamin B12 or have patient try another pharmacy that may carry the prefilled syringes.     Nurse contacted [facility]. They currently have Vitamin B12 1,000 mcg prefilled syringes in stock.

## 2024-07-29 DIAGNOSIS — Z8639 Personal history of other endocrine, nutritional and metabolic disease: Principal | ICD-10-CM

## 2024-07-29 DIAGNOSIS — C9210 Chronic myeloid leukemia, BCR/ABL-positive, not having achieved remission: Principal | ICD-10-CM

## 2024-07-29 NOTE — Unmapped (Signed)
 Patient prefers to have the vitamin B12 injection monthly in our office.  Patient is scheduled for a nurse visit on Sept 26th, 2025 to receive vitamin B12 injection. Please place orders.

## 2024-08-01 DIAGNOSIS — C9210 Chronic myeloid leukemia, BCR/ABL-positive, not having achieved remission: Principal | ICD-10-CM

## 2024-08-01 NOTE — Unmapped (Signed)
 [facility] Refill Coordination Note    Specialty Medication(s) to be Shipped:   Hematology/Oncology: Bosulif     Other medication(s) to be shipped: No additional medications requested for fill at this time    Specialty Medications not needed at this time: N/A     Jenna Mosley, DOB: [birth year]  Phone: There are no phone numbers on file.      All above HIPAA information was verified with patient.     Was a Nurse, learning disability used for this call? No    Completed refill call assessment today to schedule patient's medication shipment from the [facility]  [phone]).  All relevant notes have been reviewed.     Specialty medication(s) and dose(s) confirmed: Regimen is correct and unchanged.   Changes to medications: Jenna Mosley reports stopping the following medications: Farxiga  and started Magnesium  and Potassium  Changes to insurance: No  New side effects reported not previously addressed with a pharmacist or physician: None reported  Questions for the pharmacist: No    Confirmed patient received a Conservation officer, historic buildings and a Surveyor, mining with first shipment. The patient will receive a drug information handout for each medication shipped and additional FDA Medication Guides as required.       DISEASE/MEDICATION-SPECIFIC INFORMATION        N/A    SPECIALTY MEDICATION ADHERENCE     Medication Adherence    Patient reported X missed doses in the last month: 0  Specialty Medication: Bosulif  500 mg  Patient is on additional specialty medications: No  Informant: patient     Were doses missed due to medication being on hold? No    Bosulif  500 mg: 7 days of medicine on hand       REFERRAL TO PHARMACIST     Referral to the pharmacist: Not needed      [facility]     Shipping address confirmed in Epic.     Cost and Payment: Patient has a copay of $4. They are aware and have authorized the pharmacy to charge the credit card on file.    Delivery Scheduled: Yes, Expected medication delivery date: 08/04/24.     Medication will be delivered via Next Day Courier to the prescription address in Epic [place].    Jenna Mosley M Santer Torres   [facility]  Specialty Technician

## 2024-08-02 DIAGNOSIS — C9210 Chronic myeloid leukemia, BCR/ABL-positive, not having achieved remission: Principal | ICD-10-CM

## 2024-08-03 MED FILL — BOSULIF 500 MG TABLET: ORAL | 30 days supply | Qty: 30 | Fill #1

## 2024-08-05 ENCOUNTER — Inpatient Hospital Stay: Admit: 2024-08-05 | Discharge: 2024-08-05 | Payer: Medicaid (Managed Care)

## 2024-08-05 DIAGNOSIS — R1084 Generalized abdominal pain: Principal | ICD-10-CM

## 2024-08-05 DIAGNOSIS — Z9884 Bariatric surgery status: Principal | ICD-10-CM

## 2024-08-05 MED ADMIN — iohexol (OMNIPAQUE) 350 mg iodine/mL solution 100 mL: 100 mL | INTRAVENOUS | @ 23:00:00 | Stop: 2024-08-05

## 2024-08-06 DIAGNOSIS — C9210 Chronic myeloid leukemia, BCR/ABL-positive, not having achieved remission: Principal | ICD-10-CM
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
## 2024-08-08 DIAGNOSIS — C9210 Chronic myeloid leukemia, BCR/ABL-positive, not having achieved remission: Principal | ICD-10-CM

## 2024-08-08 MED ORDER — CHOLESTYRAMINE LIGHT 4 GRAM ORAL POWDER
Freq: Three times a day (TID) | ORAL | 0 refills | 90.00000 days | Status: CP
Start: 2024-08-08 — End: 2024-11-06

## 2024-08-08 MED ORDER — CHOLESTYRAMINE (WITH SUGAR) 4 GRAM POWDER FOR SUSP IN A PACKET
0.00000 days
Start: 2024-08-08 — End: ?

## 2024-08-08 NOTE — Unmapped (Signed)
 EGD  Procedure #1     Procedure #2   [id number]  MRN   genric   Endoscopist     Is the patient's health insurance [employer], [employer], [place] [employer] (UHC), [employer] [place], [employer], or C[employer]a?     Urgent procedure     Are you pregnant? (Ignore if [facility] GI provider has OK'd procedure in order comments despite pregnancy)     Do you have chest pain with physical activity or are you in the process of scheduling or awaiting results of a heart ultrasound, stress test, or catheterization to evaluate new or worsening chest pain, dizziness, or shortness of breath?     Do you have achalasia or gastroparesis or take Mounjaro , Zepbound, Wegovy, Trulicity, Ozempic, Victoza , Saxenda , Byetta, Bydureon, Rybelsus, or Adlyxin?      Do you take: Plavix (clopidogrel), Coumadin (warfarin), Lovenox  (enoxaparin ), Pradaxa (dabigatran), Effient (prasugrel), Xarelto (rivaroxaban), Eliquis (apixaban), Pletal (cilostazol), or Brilinta (ticagrelor)?          Did ordering provider indicate how long to hold this medication in the order comments?          Which of the above medications are you taking?          What is the name of the medical practice that manages this medication?          What is the name of the medical provider who manages this medication?     Do you have hemophilia, von Willebrand disease, or low platelets?     Do you have a pacemaker or implanted cardiac defibrillator?     Has a [place] GI provider specified the location(s)?     Which location(s) did the [facility] provider specify?          [facility]          [place]          [facility]    TRUE  Do you see a liver specialist for chronic liver disease?     Is the procedure indication for variceal screening?     Is procedure indication for variceal banding (this does NOT include variceal screening)?     Have you had a heart attack, stroke or heart stent placement within the past 6 months?     Month of event     Year of event (ONLY ENTER LAST 2 DIGITS)        6  Height (feet)     Height (inches)   269  Weight (pounds)   36.5  BMI          Did the ordering provider specify a bowel prep?          What bowel prep was specified?     Do you have an ostomy (bag on your stomach that collects your stool)?          Is it an ileostomy?          Is it a colostomy?          Patient doesn't know.     Do you have chronic kidney disease?     Do you have chronic constipation or have you had poor quality bowel preps for past colonoscopies?     Do you have Crohn's disease or ulcerative colitis?     Have you had weight loss surgery?          Do you ever use supplemental oxygen?     Have you been hospitalized for cirrhosis of the liver or  heart failure in the last 12 months?     Have you been treated for mouth or throat cancer with radiation or surgery?     Have you been told that it is difficult for doctors to insert a breathing tube in you during anesthesia?     Have you had a heart or lung transplant?          Are you on dialysis?     Have you started dialysis in the last 6 months?     Do you have cirrhosis of the liver?     Do you have myasthenia gravis?     Is the patient a prisoner?   ################# ## ###################################################################################################################   MRN:  [id number]   Anticoag Review  No   Nurse Triage  No   Procedure(s):  EGD     0   Endoscopist:  genric    Urgent:  No   Prep:                              --------------------------- --- ----------------------------------------------------------------------------------------------------------------------------------------------------------------------------   G3 Locations:  [facility]     [facility]      [place]        Requested Locations:              ################# ## ###################################################################################################################

## 2024-08-08 NOTE — Unmapped (Signed)
 I reviewed the patient's interval clinical course and her recent CT scan results, including the results concerning for multiple liver nodules.  MRI Abdomen with.without contrast ordered per radiology recommendations. Patient reports diarrhea is mildly improved, but continues. Will trial cholestyramine . Will order EGD and UGI with SBFT given ongoing reports of nausea and emesis after eating, patient insists low/no sugar and low fat foods without concern for dumping. Patient reports adherence to multivitamin regimen and improved tolerance of PO medications since last visit. Denies concerns for dehydration.  I will follow-up with her after testing is complete.    [name], MD, MPH  August 08, 2024 10:59 AM

## 2024-08-08 NOTE — Unmapped (Signed)
 Addended by: Keiarra Charon M on: 08/08/2024 11:01 AM     Modules accepted: Orders

## 2024-08-11 ENCOUNTER — Ambulatory Visit: Admit: 2024-08-11 | Discharge: 2024-08-12 | Payer: Medicaid (Managed Care)

## 2024-08-11 ENCOUNTER — Other Ambulatory Visit: Admit: 2024-08-11 | Discharge: 2024-08-12 | Payer: Medicaid (Managed Care)

## 2024-08-11 ENCOUNTER — Ambulatory Visit
Admit: 2024-08-11 | Discharge: 2024-08-12 | Payer: Medicaid (Managed Care) | Attending: Student in an Organized Health Care Education/Training Program | Primary: Student in an Organized Health Care Education/Training Program

## 2024-08-11 ENCOUNTER — Ambulatory Visit
Admit: 2024-08-11 | Discharge: 2024-08-12 | Payer: Medicaid (Managed Care) | Attending: Hematology & Oncology | Primary: Hematology & Oncology

## 2024-08-11 DIAGNOSIS — R112 Nausea with vomiting, unspecified: Principal | ICD-10-CM

## 2024-08-11 DIAGNOSIS — C9210 Chronic myeloid leukemia, BCR/ABL-positive, not having achieved remission: Principal | ICD-10-CM

## 2024-08-11 LAB — COMPREHENSIVE METABOLIC PANEL
ALBUMIN: 3 g/dL — ABNORMAL LOW (ref 3.4–5.0)
ALKALINE PHOSPHATASE: 170 U/L — ABNORMAL HIGH (ref 46–116)
ALT (SGPT): 8 U/L — ABNORMAL LOW (ref 10–49)
ANION GAP: 12 mmol/L (ref 5–14)
AST (SGOT): 26 U/L (ref <=34)
BILIRUBIN TOTAL: 0.4 mg/dL (ref 0.3–1.2)
BLOOD UREA NITROGEN: 5 mg/dL — ABNORMAL LOW (ref 9–23)
BUN / CREAT RATIO: 7
CALCIUM: 9.2 mg/dL (ref 8.7–10.4)
CHLORIDE: 105 mmol/L (ref 98–107)
CO2: 28 mmol/L (ref 20.0–31.0)
CREATININE: 0.73 mg/dL (ref 0.55–1.02)
EGFR CKD-EPI (2021) FEMALE: >90 mL/min/1.73m2 (ref >=60)
GLUCOSE RANDOM: 95 mg/dL (ref 70–179)
POTASSIUM: 3.3 mmol/L — ABNORMAL LOW (ref 3.4–4.8)
PROTEIN TOTAL: 6.5 g/dL (ref 5.7–8.2)
SODIUM: 145 mmol/L (ref 135–145)

## 2024-08-11 LAB — CBC W/ AUTO DIFF
BASOPHILS ABSOLUTE COUNT: 0 10*9/L (ref 0.0–0.1)
BASOPHILS RELATIVE PERCENT: 0.6 %
EOSINOPHILS ABSOLUTE COUNT: 0.1 10*9/L (ref 0.0–0.5)
EOSINOPHILS RELATIVE PERCENT: 1.7 %
HEMATOCRIT: 37.3 % (ref 34.0–44.0)
HEMOGLOBIN: 12.3 g/dL (ref 11.3–14.9)
LYMPHOCYTES ABSOLUTE COUNT: 2.5 10*9/L (ref 1.1–3.6)
LYMPHOCYTES RELATIVE PERCENT: 48.7 %
MEAN CORPUSCULAR HEMOGLOBIN CONC: 33 g/dL (ref 32.0–36.0)
MEAN CORPUSCULAR HEMOGLOBIN: 29.1 pg (ref 25.9–32.4)
MEAN CORPUSCULAR VOLUME: 88.1 fL (ref 77.6–95.7)
MEAN PLATELET VOLUME: 9.8 fL (ref 6.8–10.7)
MONOCYTES ABSOLUTE COUNT: 0.3 10*9/L (ref 0.3–0.8)
MONOCYTES RELATIVE PERCENT: 6.7 %
NEUTROPHILS ABSOLUTE COUNT: 2.2 10*9/L (ref 1.8–7.8)
NEUTROPHILS RELATIVE PERCENT: 42.3 %
PLATELET COUNT: 157 10*9/L (ref 150–450)
RED BLOOD CELL COUNT: 4.24 10*12/L (ref 3.95–5.13)
RED CELL DISTRIBUTION WIDTH: 14.9 % (ref 12.2–15.2)
WBC ADJUSTED: 5.2 10*9/L (ref 3.6–11.2)

## 2024-08-11 LAB — MAGNESIUM: MAGNESIUM: 1.8 mg/dL (ref 1.6–2.6)

## 2024-08-11 NOTE — Unmapped (Addendum)
 It was great to see you today! Keep taking your potassium and your Bosutinib!    calcium  carbonate-vitamin D3 600 mg-20 mcg (800 unit) Tab (get in gummy form)  ferrous sulfate  325 (65 FE) MG tablet  (get in gummy form)    Come back to see Dr. Carolan on 12/11 to recheck your BCR-ABL    If you have questions or concerns, you may call the Nurse call line at [phone].    Lab on 08/11/2024   Component Date Value Ref Range Status    WBC 08/11/2024 5.2  3.6 - 11.2 10*9/L Final    RBC 08/11/2024 4.24  3.95 - 5.13 10*12/L Final    HGB 08/11/2024 12.3  11.3 - 14.9 g/dL Final    HCT 90/74/7974 37.3  34.0 - 44.0 % Final    MCV 08/11/2024 88.1  77.6 - 95.7 fL Final    MCH 08/11/2024 29.1  25.9 - 32.4 pg Final    MCHC 08/11/2024 33.0  32.0 - 36.0 g/dL Final    RDW 90/74/7974 14.9  12.2 - 15.2 % Final    MPV 08/11/2024 9.8  6.8 - 10.7 fL Final    Platelet 08/11/2024 157  150 - 450 10*9/L Final    Neutrophils % 08/11/2024 42.3  % Final    Lymphocytes % 08/11/2024 48.7  % Final    Monocytes % 08/11/2024 6.7  % Final    Eosinophils % 08/11/2024 1.7  % Final    Basophils % 08/11/2024 0.6  % Final    Absolute Neutrophils 08/11/2024 2.2  1.8 - 7.8 10*9/L Final    Absolute Lymphocytes 08/11/2024 2.5  1.1 - 3.6 10*9/L Final    Absolute Monocytes 08/11/2024 0.3  0.3 - 0.8 10*9/L Final    Absolute Eosinophils 08/11/2024 0.1  0.0 - 0.5 10*9/L Final    Absolute Basophils 08/11/2024 0.0  0.0 - 0.1 10*9/L Final

## 2024-08-12 DIAGNOSIS — C9210 Chronic myeloid leukemia, BCR/ABL-positive, not having achieved remission: Principal | ICD-10-CM

## 2024-08-12 MED ORDER — NORETHINDRONE ACETATE 5 MG TABLET
ORAL_TABLET | Freq: Every day | ORAL | 0 refills | 90.00000 days | Status: CP
Start: 2024-08-12 — End: ?

## 2024-08-17 DIAGNOSIS — C9210 Chronic myeloid leukemia, BCR/ABL-positive, not having achieved remission: Principal | ICD-10-CM

## 2024-08-17 MED ORDER — POTASSIUM CHLORIDE ER 10 MEQ TABLET,EXTENDED RELEASE(PART/CRYST)
ORAL_TABLET | Freq: Every day | ORAL | 3 refills | 90.00000 days | Status: CP
Start: 2024-08-17 — End: ?

## 2024-08-17 MED ORDER — CETIRIZINE 10 MG TABLET
ORAL_TABLET | Freq: Every morning | ORAL | 3 refills | 90.00000 days | Status: CP
Start: 2024-08-17 — End: ?

## 2024-08-17 NOTE — Unmapped (Signed)
 Adult Refill: Patient is requesting the following refill  Requested Prescriptions     Pending Prescriptions Disp Refills    potassium chloride  10 MEQ ER tablet 180 tablet 3     Sig: Take 2 tablets (20 mEq total) by mouth daily.    cetirizine  (ZYRTEC ) 10 MG tablet 90 tablet 3     Sig: Take 1 tablet (10 mg total) by mouth every morning.       Recent Visits  Date Type Provider Dept   07/12/24 Office Visit Erdem, Grady Gear, MD [facility] Internal Medicine [facility] [place]   05/17/24 Office Visit Cleotilde, Beverley Leisure, MD [facility] Internal Medicine [facility] [place]   12/31/23 Office Visit Erdem, Grady Gear, MD [facility] Internal Medicine [facility] [place]   11/24/23 Office Visit [name], MD [facility] Internal Medicine [facility] [place]   11/06/23 Office Visit Trenda Core, MD [facility] Medicine [facility]   Showing recent visits within past 365 days and meeting all other requirements  Future Appointments  No visits were found meeting these conditions.  Showing future appointments within next 365 days and meeting all other requirements       Labs: Potassium:   Potassium (mmol/L)   Date Value   08/11/2024 3.3 (L)   01/05/2015 4.4    Vitals:   BP Readings from Last 3 Encounters:   08/11/24 133/67   07/22/24 153/82   07/19/24 156/91    and   Pulse Readings from Last 3 Encounters:   08/11/24 57   07/22/24 62   07/19/24 59     Patient decided to go back to potassium tablets.

## 2024-08-19 MED ADMIN — cyanocobalamin (vitamin B-12) injection 1,000 mcg: 1000 ug | INTRAMUSCULAR | @ 20:00:00 | Stop: 2025-07-24

## 2024-08-24 DIAGNOSIS — C9210 Chronic myeloid leukemia, BCR/ABL-positive, not having achieved remission: Principal | ICD-10-CM

## 2024-08-24 NOTE — Unmapped (Signed)
 Patient arrived today for influenza vaccination. Patient tolerated the injection well.  VIS given to patient and questions answered  Patient also received B12 injection.

## 2024-08-31 ENCOUNTER — Inpatient Hospital Stay: Admit: 2024-08-31 | Discharge: 2024-08-31 | Payer: Medicaid (Managed Care)

## 2024-08-31 NOTE — Unmapped (Signed)
 Negative mammo message sent via mychart

## 2024-09-02 DIAGNOSIS — C9210 Chronic myeloid leukemia, BCR/ABL-positive, not having achieved remission: Principal | ICD-10-CM

## 2024-09-02 NOTE — Unmapped (Signed)
 [facility] Refill Coordination Note    Specialty Medication(s) to be Shipped:   Hematology/Oncology: Bosulif     Other medication(s) to be shipped: No additional medications requested for fill at this time    Specialty Medications not needed at this time: N/A     Jenna Mosley, DOB: 1990-12-10  Phone: There are no phone numbers on file.      All above HIPAA information was verified with patient.     Was a Nurse, learning disability used for this call? No    Completed refill call assessment today to schedule patient's medication shipment from the [facility]  [phone]).  All relevant notes have been reviewed.     Specialty medication(s) and dose(s) confirmed: Regimen is correct and unchanged.   Changes to medications: Tola reports no changes at this time.  Changes to insurance: No  New side effects reported not previously addressed with a pharmacist or physician: None reported  Questions for the pharmacist: No    Confirmed patient received a Conservation officer, historic buildings and a Surveyor, mining with first shipment. The patient will receive a drug information handout for each medication shipped and additional FDA Medication Guides as required.       DISEASE/MEDICATION-SPECIFIC INFORMATION        N/A    SPECIALTY MEDICATION ADHERENCE     Medication Adherence    Patient reported X missed doses in the last month: 0  Specialty Medication: Bosulif  500 mg  Patient is on additional specialty medications: No  Informant: patient              Were doses missed due to medication being on hold? No    Bosulif  500mg   : 5 days of medicine on hand       REFERRAL TO PHARMACIST     Referral to the pharmacist: Not needed      [facility]     Shipping address confirmed in Epic.     Cost and Payment: Patient has a copay of $4. They are aware and have authorized the pharmacy to charge the credit card on file.    Delivery Scheduled: Yes, Expected medication delivery date: 10/21.     Medication will be delivered via Same Day Courier to the prescription address in Epic WAM.    Jenna Mosley   [facility]  Specialty Technician

## 2024-09-05 MED FILL — BOSULIF 500 MG TABLET: ORAL | 30 days supply | Qty: 30 | Fill #2

## 2024-09-12 DIAGNOSIS — C9210 Chronic myeloid leukemia, BCR/ABL-positive, not having achieved remission: Principal | ICD-10-CM

## 2024-09-13 DIAGNOSIS — C9210 Chronic myeloid leukemia, BCR/ABL-positive, not having achieved remission: Principal | ICD-10-CM

## 2024-09-25 DIAGNOSIS — C9210 Chronic myeloid leukemia, BCR/ABL-positive, not having achieved remission: Principal | ICD-10-CM

## 2024-09-26 DIAGNOSIS — C9210 Chronic myeloid leukemia, BCR/ABL-positive, not having achieved remission: Principal | ICD-10-CM

## 2024-09-27 DIAGNOSIS — Z0389 Encounter for observation for other suspected diseases and conditions ruled out: Principal | ICD-10-CM

## 2024-09-27 DIAGNOSIS — K909 Intestinal malabsorption, unspecified: Principal | ICD-10-CM

## 2024-09-27 DIAGNOSIS — K7581 Nonalcoholic steatohepatitis (NASH): Principal | ICD-10-CM

## 2024-09-27 DIAGNOSIS — R197 Diarrhea, unspecified: Principal | ICD-10-CM

## 2024-09-27 DIAGNOSIS — E538 Deficiency of other specified B group vitamins: Principal | ICD-10-CM

## 2024-09-27 DIAGNOSIS — I10 Essential (primary) hypertension: Principal | ICD-10-CM

## 2024-09-27 DIAGNOSIS — E282 Polycystic ovarian syndrome: Principal | ICD-10-CM

## 2024-09-27 DIAGNOSIS — Z9884 Bariatric surgery status: Principal | ICD-10-CM

## 2024-09-27 MED ORDER — RIFAXIMIN 550 MG TABLET
ORAL_TABLET | Freq: Two times a day (BID) | ORAL | 0 refills | 14.00000 days | Status: CP
Start: 2024-09-27 — End: 2024-09-27

## 2024-09-27 NOTE — Patient Instructions (Signed)
 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 We discussed your gastrointestinal symptoms, dietary limitations, weight loss, and challenges with medication management.    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  -Eat yogurt while taking antibiotics to support gut health.  -We will check your vitamin C level.  -We coordinated with Doctor Trinidad regarding your treatment plan.    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  -Monitor your vitamin levels through your oncology and primary care doctors.  -Try to take your vitamins when possible, even if not with food.    STATUS POST BARIATRIC SURGERY: You have had significant complications since your gastric bypass surgery, including severe diarrhea, vomiting, and difficulty tolerating most foods, which has impacted your quality of life.  -Make dietary modifications to help manage your symptoms.  -Consider exercising as tolerated.
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
## 2024-09-28 DIAGNOSIS — C9210 Chronic myeloid leukemia, BCR/ABL-positive, not having achieved remission: Principal | ICD-10-CM

## 2024-09-28 MED ORDER — BOSUTINIB 500 MG TABLET
ORAL_TABLET | ORAL | 2 refills | 0.00000 days | Status: CP
Start: 2024-09-28 — End: ?
  Filled 2024-10-06: qty 30, 30d supply, fill #0

## 2024-09-30 ENCOUNTER — Encounter: Payer: Self-pay | Admitting: Family Medicine

## 2024-10-03 ENCOUNTER — Ambulatory Visit: Admit: 2024-10-03 | Discharge: 2024-10-04 | Payer: Medicaid (Managed Care)

## 2024-10-03 DIAGNOSIS — C9210 Chronic myeloid leukemia, BCR/ABL-positive, not having achieved remission: Principal | ICD-10-CM

## 2024-10-03 DIAGNOSIS — Z0389 Encounter for observation for other suspected diseases and conditions ruled out: Principal | ICD-10-CM

## 2024-10-03 DIAGNOSIS — E538 Deficiency of other specified B group vitamins: Principal | ICD-10-CM

## 2024-10-03 DIAGNOSIS — R197 Diarrhea, unspecified: Principal | ICD-10-CM

## 2024-10-03 DIAGNOSIS — K909 Intestinal malabsorption, unspecified: Principal | ICD-10-CM

## 2024-10-03 DIAGNOSIS — K7581 Nonalcoholic steatohepatitis (NASH): Principal | ICD-10-CM

## 2024-10-03 DIAGNOSIS — Z9884 Bariatric surgery status: Principal | ICD-10-CM

## 2024-10-03 DIAGNOSIS — E282 Polycystic ovarian syndrome: Principal | ICD-10-CM

## 2024-10-03 DIAGNOSIS — I10 Essential (primary) hypertension: Principal | ICD-10-CM

## 2024-10-03 LAB — CBC W/ AUTO DIFF
BASOPHILS ABSOLUTE COUNT: 0 10*9/L (ref 0.0–0.1)
BASOPHILS RELATIVE PERCENT: 0.8 %
EOSINOPHILS ABSOLUTE COUNT: 0.1 10*9/L (ref 0.0–0.5)
EOSINOPHILS RELATIVE PERCENT: 1.5 %
HEMATOCRIT: 38.2 % (ref 34.0–44.0)
HEMOGLOBIN: 12.7 g/dL (ref 11.3–14.9)
LYMPHOCYTES ABSOLUTE COUNT: 2.5 10*9/L (ref 1.1–3.6)
LYMPHOCYTES RELATIVE PERCENT: 47 %
MEAN CORPUSCULAR HEMOGLOBIN CONC: 33.1 g/dL (ref 32.0–36.0)
MEAN CORPUSCULAR HEMOGLOBIN: 30.2 pg (ref 25.9–32.4)
MEAN CORPUSCULAR VOLUME: 91 fL (ref 77.6–95.7)
MEAN PLATELET VOLUME: 9.9 fL (ref 6.8–10.7)
MONOCYTES ABSOLUTE COUNT: 0.3 10*9/L (ref 0.3–0.8)
MONOCYTES RELATIVE PERCENT: 6.3 %
NEUTROPHILS ABSOLUTE COUNT: 2.4 10*9/L (ref 1.8–7.8)
NEUTROPHILS RELATIVE PERCENT: 44.4 %
PLATELET COUNT: 178 10*9/L (ref 150–450)
RED BLOOD CELL COUNT: 4.2 10*12/L (ref 3.95–5.13)
RED CELL DISTRIBUTION WIDTH: 14.2 % (ref 12.2–15.2)
WBC ADJUSTED: 5.4 10*9/L (ref 3.6–11.2)

## 2024-10-03 LAB — IRON & TIBC
IRON SATURATION: 42 % (ref 20–55)
IRON: 83 ug/dL (ref 50–170)
TOTAL IRON BINDING CAPACITY: 197 ug/dL — ABNORMAL LOW (ref 250–425)

## 2024-10-03 LAB — COMPREHENSIVE METABOLIC PANEL
ALBUMIN: 2.7 g/dL — ABNORMAL LOW (ref 3.4–5.0)
ALKALINE PHOSPHATASE: 263 U/L — ABNORMAL HIGH (ref 46–116)
ALT (SGPT): 10 U/L (ref 10–49)
ANION GAP: 11 mmol/L (ref 5–14)
AST (SGOT): 31 U/L (ref <=34)
BILIRUBIN TOTAL: 0.5 mg/dL (ref 0.3–1.2)
BLOOD UREA NITROGEN: <5 mg/dL — ABNORMAL LOW (ref 9–23)
CALCIUM: 8.7 mg/dL (ref 8.7–10.4)
CHLORIDE: 101 mmol/L (ref 98–107)
CO2: 30.7 mmol/L (ref 20.0–31.0)
CREATININE: 1 mg/dL (ref 0.55–1.02)
EGFR CKD-EPI (2021) FEMALE: 75 mL/min/1.73m2 (ref >=60)
GLUCOSE RANDOM: 90 mg/dL (ref 70–179)
POTASSIUM: 3.9 mmol/L (ref 3.4–4.8)
PROTEIN TOTAL: 6 g/dL (ref 5.7–8.2)
SODIUM: 143 mmol/L (ref 135–145)

## 2024-10-03 LAB — VITAMIN B12: VITAMIN B-12: 1096 pg/mL — ABNORMAL HIGH (ref 211–911)

## 2024-10-03 LAB — MAGNESIUM: MAGNESIUM: 2.2 mg/dL (ref 1.6–2.6)

## 2024-10-03 LAB — FOLATE: FOLATE: 6.9 ng/mL (ref >=5.4)

## 2024-10-03 LAB — FERRITIN: FERRITIN: 175.9 ng/mL (ref 7.3–270.7)

## 2024-10-03 MED ORDER — METRONIDAZOLE 250 MG TABLET
ORAL_TABLET | Freq: Three times a day (TID) | ORAL | 0 refills | 12.00000 days | Status: CP
Start: 2024-10-03 — End: 2024-10-15

## 2024-10-03 NOTE — Progress Notes (Signed)
 [facility] and Home Delivery Pharmacy Refill Coordination Note    Specialty Medication(s) to be Shipped:   Hematology/Oncology: Bosulif     Other medication(s) to be shipped: No additional medications requested for fill at this time    Specialty Medications not needed at this time: N/A     Jenna Mosley, DOB: June 21, 1991  Phone: There are no phone numbers on file.      All above HIPAA information was verified with patient.     Was a nurse, learning disability used for this call? No    Completed refill call assessment today to schedule patient's medication shipment from the [facility] Delivery [facility]  [phone]).  All relevant notes have been reviewed.     Specialty medication(s) and dose(s) confirmed: Regimen is correct and unchanged.   Changes to medications: Teela reports no changes at this time.  Changes to insurance: No  New side effects reported not previously addressed with a pharmacist or physician: None reported  Questions for the pharmacist: No    Confirmed patient received a Conservation Officer, Historic Buildings and a Surveyor, Mining with first shipment. The patient will receive a drug information handout for each medication shipped and additional FDA Medication Guides as required.       DISEASE/MEDICATION-SPECIFIC INFORMATION        N/A    SPECIALTY MEDICATION ADHERENCE     Medication Adherence    Patient reported X missed doses in the last month: 1  Specialty Medication: Bosulif  500mg   Patient is on additional specialty medications: No  Informant: patient     Were doses missed due to medication being on hold? No    Bosulif  500 mg: 7 days of medicine on hand       REFERRAL TO PHARMACIST     Referral to the pharmacist: Not needed      [facility]     Shipping address confirmed in Epic.     Cost and Payment: Patient has a copay of $4. They are aware and have authorized the pharmacy to charge the credit card on file.    Delivery Scheduled: Yes, Expected medication delivery date: 10/07/24.     Medication will be delivered via Next Day Courier to the prescription address in Epic [place].    Bethaney Oshana M Santer Torres   [facility]  Specialty Technician

## 2024-10-03 NOTE — Progress Notes (Signed)
 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 

## 2024-10-03 NOTE — Progress Notes (Signed)
 [facility] Medicaid. Xiffaxin is not covered under her Medicaid plan. Provider made aware and Medicaid's suggestion for alternatives was shared with provider.

## 2024-10-04 DIAGNOSIS — R748 Abnormal levels of other serum enzymes: Principal | ICD-10-CM

## 2024-10-04 LAB — VITAMIN D 25 HYDROXY: VITAMIN D, TOTAL (25OH): 80.5 ng/mL — ABNORMAL HIGH (ref 20.0–80.0)

## 2024-10-05 LAB — ZINC: ZINC: 69 ug/dL

## 2024-10-06 ENCOUNTER — Inpatient Hospital Stay: Admit: 2024-10-06 | Discharge: 2024-10-06 | Payer: Medicaid (Managed Care)

## 2024-10-06 MED ADMIN — barium sulfate (LIQUID E-Z-PAQUE) 60% (w/v) oral suspension: 400 mL | ORAL | @ 17:00:00 | Stop: 2024-10-06

## 2024-10-06 MED ADMIN — barium sulfate (E-Z-DISK) tablet 700 mg: 700 mg | ORAL | @ 17:00:00 | Stop: 2024-10-06

## 2024-10-06 MED ADMIN — gadopiclenol (ELUCIREM,VUEWAY) injection 10 mL: 10 mL | INTRAVENOUS | @ 15:00:00 | Stop: 2024-10-06

## 2024-10-06 NOTE — Progress Notes (Signed)
  Specialty and Home Delivery Pharmacy - Clinical Pharmacist Intervention   Reason for Intervention Issue Observed Medication interaction     Medication-Medication    Additional Comments     Intervention Type of Intervention Medication delivery scheduled                  Recommendation provided, if applicable      Medication Medication(s) Involved Bosulif /Omeprazole -provider aware Jenna Mosley) and benefit outweighs risk   Outcome Expected Result/Outcome of Intervention Improved patient safety    Additional Comments      Follow-up Needed? No    Additional Follow-up Comments     Supporting Information Approximate Time Spent on Intervention 11-20 minutes    Clinical Evidence Used Clinical guidelines or Drug information resource

## 2024-10-07 LAB — VITAMIN K: VITAMIN K1: 0.11 ng/mL

## 2024-10-07 LAB — VITAMIN B1, WHOLE BLOOD: VITAMIN B1: 176 nmol/L

## 2024-10-08 LAB — VITAMIN A AND VITAMIN E
VITAMIN A: 21.9 ug/dL — ABNORMAL LOW
VITAMIN E LEVEL: 6.8 mg/L

## 2024-10-08 LAB — VITAMIN C: VITAMIN C LEVEL: 0.7 mg/dL

## 2024-10-08 LAB — VITAMIN B6: VITAMIN B6: 5 ug/L

## 2024-10-17 ENCOUNTER — Ambulatory Visit: Admit: 2024-10-17 | Discharge: 2024-10-18 | Payer: Medicaid (Managed Care)

## 2024-10-17 DIAGNOSIS — C9210 Chronic myeloid leukemia, BCR/ABL-positive, not having achieved remission: Principal | ICD-10-CM

## 2024-10-17 MED ADMIN — leuprolide (LUPRON) injection 11.25 mg: 11.25 mg | INTRAMUSCULAR | @ 18:00:00 | Stop: 2024-10-17

## 2024-10-17 NOTE — Progress Notes (Signed)
 Lupron  11.25mg  3 month depot injection given as directed per therapy plan. Pt tolerated injection well. Discharged home to self care.
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
## 2024-10-31 DIAGNOSIS — C9210 Chronic myeloid leukemia, BCR/ABL-positive, not having achieved remission: Principal | ICD-10-CM

## 2024-10-31 NOTE — Progress Notes (Signed)
 [facility] Specialty and Home Delivery Pharmacy Refill Coordination Note    Specialty Medication(s) to be Shipped:   Hematology/Oncology: Bosulif     Other medication(s) to be shipped: No additional medications requested for fill at this time    Specialty Medications not needed at this time: N/A     Jenna Mosley, DOB: 14-Dec-1990  Phone: There are no phone numbers on file.      All above HIPAA information was verified with patient.     Was a nurse, learning disability used for this call? No    Completed refill call assessment today to schedule patient's medication shipment from the [facility] and [facility] Delivery Pharmacy  [phone]).  All relevant notes have been reviewed.     Specialty medication(s) and dose(s) confirmed: Regimen is correct and unchanged.   Changes to medications: [name] reports no changes at this time.  Changes to insurance: No  New side effects reported not previously addressed with a pharmacist or physician: None reported  Questions for the pharmacist: No    Confirmed patient received a Conservation Officer, Historic Buildings and a Surveyor, Mining with first shipment. The patient will receive a drug information handout for each medication shipped and additional FDA Medication Guides as required.       DISEASE/MEDICATION-SPECIFIC INFORMATION        N/A    SPECIALTY MEDICATION ADHERENCE     Medication Adherence    Patient reported X missed doses in the last month: 2  Specialty Medication: Bosulif  500mg   Patient is on additional specialty medications: No  Informant: patient     Were doses missed due to medication being on hold? No    Bosulif  500 mg: 7 days of medicine on hand       Specialty medication is an injection or given on a cycle: No    REFERRAL TO PHARMACIST     Referral to the pharmacist: Not needed      [facility]     Shipping address confirmed in Epic.     Cost and Payment: Patient has a copay of $4. They are aware and have authorized the pharmacy to charge the credit card on file.    Delivery Scheduled: Yes, Expected medication delivery date: 11/04/24.     Medication will be delivered via Next Day Courier to the prescription address in Epic [place].    [name]   [facility]  Specialty Technician

## 2024-11-01 ENCOUNTER — Ambulatory Visit
Admit: 2024-11-01 | Discharge: 2024-11-02 | Payer: Medicaid (Managed Care) | Attending: Student in an Organized Health Care Education/Training Program | Primary: Student in an Organized Health Care Education/Training Program

## 2024-11-01 DIAGNOSIS — Z9884 Bariatric surgery status: Principal | ICD-10-CM

## 2024-11-01 DIAGNOSIS — K529 Noninfective gastroenteritis and colitis, unspecified: Principal | ICD-10-CM

## 2024-11-01 MED ORDER — CHOLESTYRAMINE LIGHT 4 GRAM POWDER FOR SUSPENSION IN A PACKET
PACK | Freq: Four times a day (QID) | ORAL | 3 refills | 15.00000 days | Status: CP | PRN
Start: 2024-11-01 — End: 2024-11-01

## 2024-11-01 NOTE — Progress Notes (Signed)
 Outpatient Postoperative Note    PRIMARY CARE PROVIDER: [name], MD     REFERRING PROVIDER: Gladis Fallow, MD  [address] [facility] [address]  [address] [place],  [place] [address]     Subjective:    Interval update:  Jenna Mosley is doing better than at her previous visit but continues to have issues with swallowing and with diarrhea. She reports that since her last visit she has begun to diversify her diet. She is tolerating beans and turkey sausage as sources of protein. She reports ability to swallow popcorn and chips, but not bread or solid meats like chicken. She tolerates liquids without difficulty. She also complains of persistent diarrhea. She reports up to 15 loose stools per day and some fecal incontinence at night. She is also complaining of N/V associated with attempting to swallow specific foods like broccoli and chicken. She is able to swallow her pills. She never received the cholestyramine  previously prescribed.    Prior history:  Jenna Mosley is a 33 y.o. female who is here today for a 3 week follow-up visit following laparoscopic gastric bypass.  Surgery was performed on 06/01/24. She was readmitted on 06/08/24 for nausea, vomiting, and chest pain. Cardiac work up negative, seen by cardiology with no further intervention.     She endorses nausea, vomiting, diarrhea (10-15 episodes/day), lower abdominal pain, and gas pain. She states she has n/v every day, she has difficulty tolerating certain pills, especially the potassium pills. She states most days she is not able to get all pills down. She has very poor intake, only tolerating eating saltine crackers some days, drinks 2 40oz Stanley's per day and multiple gatorades per day. She states she recently purchased a blender and is going to try to do protein shakes with fruit. Patient states her diarrhea does not have fat containing content but endorses a large amount each time she has a bowel movement.     Most recent K was 2.8 on 9/4. Patient endorses multiple fall episodes due to losing her balance.       Objective:     BP 127/82 (BP Position: Sitting, BP Cuff Size: Large)  - Pulse 65  - Temp 35.9 ??C (96.7 ??F) (Temporal)  - Ht 182.9 cm (6')  - Wt (!) 111.3 kg (245 lb 6.4 oz) Comment: scale at home reads 239 - BMI 33.28 kg/m??     Wt Readings from Last 6 Encounters:   11/01/24 (!) 111.3 kg (245 lb 6.4 oz)   09/27/24 (!) 113.4 kg (250 lb)   08/11/24 (!) 122.1 kg (269 lb 2.9 oz)   07/22/24 (!) 126.1 kg (278 lb)   07/19/24 (!) 127 kg (280 lb)   07/12/24 (!) 133.6 kg (294 lb 9.6 oz)    06/07/24 132.5kg (292 lb)    Physical Exam:    General: Unremarkable.   HEENT: No scleral icterus, EOM intact.  Lungs: Clear.  Heart: RRR.  Abdomen: Soft, non-tender, nondistended, incision sites healing without fluctuance or cellulitis.  Extremities: Benign.  Neuro: Grossly nonfocal.    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 There are overall fewer lesions when compared to prior and many of them have decreased in size. No suspicious hepatic mass.      If further differentiation between adenoma and FNH was important in this patient, a contrast-enhanced abdominal MRI using Eovist as the contrast agent could be obtained for further characterization.  UGI with SBFT:  hernia.      --Contrast moved readily out of the gastric pouch and into the distal small bowel loops. Small bowel transit time measured near the lower limits of normal at 40 minutes.      --No evidence for stricturing or obstruction.      --Mild esophageal dysmotility.     Assessment & Plan:   Jenna Mosley is a 40F s/p gastric bypass 06/01/2024 who's postop course has been complicated by nausea, vomiting, many episodes of diarrhea, lower abdominal pain, gas pain, PO intolerance, and pill intolerance. While she notes ongoing challenges with food variety, on closer inspection, her diet has expanded very well from her prior visits. We reviewed her weight loss as expected by the [facility] risk calculator, and her weight loss is expected for her post-op course.   We discussed that her diarrhea may be multifactorial.  While some diarrhea may be normal after gastric bypass, we would expect excess weight loss if she was having excessive malabsorptive diarrhea.   Her symptoms did start prior to surgery and she attributed these to the protein shakes at the time of her liver reduction diet; however, we reviewed that there could be other etiologies to investigate, and we again discussed some concern that her symptoms may be related to dumping syndrome; however, her symptoms have persisted despite better food choices.    Plans:   - Continue potassium supplementation  - Continue mulivitamins  - Continue to maintain adequate protein intake  - Will obtain modified barium swallow to assess for possible cervical esophageal source of her dysphagia.  - Will refer tor upper endoscopy to assess for marginal ulcer/stricture.  - Trial cholestyramine  for her diarrhea in case this is bile acid diarrhea; re-prescribed and sent to a new pharmacy.  - Will also refer to gastroenterology to assess for causes of her diarrhea that may be unrelated to her bypass given she was having loose stools prior to surgery  - Keep Hepatology follow up as scheduled.       Cyndee Free, MD  R4, General Surgery    Attestation    I saw and evaluated the patient, participating in the key portions of the service.  I reviewed the resident???s note.  I agree with the resident???s findings and plan.     My consultation with the patient included 35 minutes/ or discussion of evaluation and management with moderate risk from additional diagnostic testing and treatment, during which time I personally reviewed the results of their objective evaluation with them (including all pertinent and available laboratory and imaging evaluations), discussed their symptoms and the pathophysiology of their disease process(es) with them, and discussed the specific patient-related and procedure-related risks of elective major surgery with them. In addition, the patient's ongoing disease process, treatment, and management discussions were significantly limited by establish social determinants of health.      [name], MD, MPH  11/06/2024  1:24 PM

## 2024-11-01 NOTE — Patient Instructions (Signed)
 At your visit today we have placed a referral to GI Medicine- you will receive a call for scheduling.    EGD and Colonoscopy were ordered today-  you will receive a call for scheduling.    Modified Barium Swallow was ordered - you will receive a call from radiology for scheduling.    Follow up with Dr. Trinidad after testing is completed.    If you have any questions regarding your visit today please feel free to contact me at the number listed below.     Olam Gaskins, RN, Bariatric Coordinator  Surgical RN Coordinator for Dr. Annitta Manes   [email or web address]  Phone:  [phone]  Fax:       [phone]

## 2024-11-03 MED FILL — BOSULIF 500 MG TABLET: ORAL | 30 days supply | Qty: 30 | Fill #1

## 2024-11-06 DIAGNOSIS — C9210 Chronic myeloid leukemia, BCR/ABL-positive, not having achieved remission: Principal | ICD-10-CM

## 2024-11-06 MED ORDER — CHOLESTYRAMINE (WITH SUGAR) 4 GRAM POWDER FOR SUSP IN A PACKET
0.00000 days
Start: 2024-11-06 — End: ?

## 2024-11-28 DIAGNOSIS — C9210 Chronic myeloid leukemia, BCR/ABL-positive, not having achieved remission: Principal | ICD-10-CM

## 2024-11-30 DIAGNOSIS — C9210 Chronic myeloid leukemia, BCR/ABL-positive, not having achieved remission: Principal | ICD-10-CM

## 2024-11-30 NOTE — Progress Notes (Signed)
 [facility] Refill Coordination Note    Specialty Medication(s) to be Shipped:   Hematology/Oncology: Bosulif     Other medication(s) to be shipped: No additional medications requested for fill at this time    Specialty Medications not needed at this time: N/A     Chiquita Ronnald Lobstein, DOB: [birth year]  Phone: There are no phone numbers on file.      All above HIPAA information was verified with patient.     Was a nurse, learning disability used for this call? No    Completed refill call assessment today to schedule patient's medication shipment from the [facility]  [phone]).  All relevant notes have been reviewed.     Specialty medication(s) and dose(s) confirmed: Regimen is correct and unchanged.   Changes to medications: Tawsha reports stopping the following medications: Vitamin D and started Vitamin A and Magnesium   Changes to insurance: No  New side effects reported not previously addressed with a pharmacist or physician: None reported  Questions for the pharmacist: No    Confirmed patient received a Conservation Officer, Historic Buildings and a Surveyor, Mining with first shipment. The patient will receive a drug information handout for each medication shipped and additional FDA Medication Guides as required.       DISEASE/MEDICATION-SPECIFIC INFORMATION        N/A    SPECIALTY MEDICATION ADHERENCE     Medication Adherence    Patient reported X missed doses in the last month: 2  Specialty Medication: Bosulif  500mg   Patient is on additional specialty medications: No  Informant: patient     Were doses missed due to medication being on hold? No    Bosulif  500 mg: 10 days of medicine on hand       Specialty medication is an injection or given on a cycle: No    REFERRAL TO PHARMACIST     Referral to the pharmacist: Not needed      [facility]     Shipping address confirmed in Epic.     Cost and Payment: Patient has a copay of $4. They are aware and have authorized the pharmacy to charge the credit card on file.    Delivery Scheduled: Yes, Expected medication delivery date: 12/07/24.     Medication will be delivered via Next Day Courier to the prescription address in Epic [place].    Lahna Nath M Santer Torres   [facility]  Specialty Technician

## 2024-12-03 DIAGNOSIS — C9210 Chronic myeloid leukemia, BCR/ABL-positive, not having achieved remission: Principal | ICD-10-CM
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
## 2024-12-06 MED FILL — BOSULIF 500 MG TABLET: 30 days supply | Qty: 30 | Fill #2

## 2024-12-07 DIAGNOSIS — C9210 Chronic myeloid leukemia, BCR/ABL-positive, not having achieved remission: Principal | ICD-10-CM

## 2024-12-08 ENCOUNTER — Other Ambulatory Visit: Admit: 2024-12-08 | Discharge: 2024-12-08 | Payer: Medicaid (Managed Care)

## 2024-12-08 ENCOUNTER — Ambulatory Visit
Admit: 2024-12-08 | Discharge: 2024-12-08 | Payer: Medicaid (Managed Care) | Attending: Student in an Organized Health Care Education/Training Program | Primary: Student in an Organized Health Care Education/Training Program

## 2024-12-08 DIAGNOSIS — C9210 Chronic myeloid leukemia, BCR/ABL-positive, not having achieved remission: Principal | ICD-10-CM

## 2024-12-08 LAB — CBC W/ AUTO DIFF
BASOPHILS ABSOLUTE COUNT: 0 10*9/L (ref 0.0–0.1)
BASOPHILS RELATIVE PERCENT: 0.9 %
EOSINOPHILS ABSOLUTE COUNT: 0.2 10*9/L (ref 0.0–0.5)
EOSINOPHILS RELATIVE PERCENT: 3.5 %
HEMATOCRIT: 35.1 % (ref 34.0–44.0)
HEMOGLOBIN: 11.4 g/dL (ref 11.3–14.9)
LYMPHOCYTES ABSOLUTE COUNT: 2.5 10*9/L (ref 1.1–3.6)
LYMPHOCYTES RELATIVE PERCENT: 53.2 %
MEAN CORPUSCULAR HEMOGLOBIN CONC: 32.6 g/dL (ref 32.0–36.0)
MEAN CORPUSCULAR HEMOGLOBIN: 30.6 pg (ref 25.9–32.4)
MEAN CORPUSCULAR VOLUME: 93.9 fL (ref 77.6–95.7)
MEAN PLATELET VOLUME: 9.8 fL (ref 6.8–10.7)
MONOCYTES ABSOLUTE COUNT: 0.3 10*9/L (ref 0.3–0.8)
MONOCYTES RELATIVE PERCENT: 6 %
NEUTROPHILS ABSOLUTE COUNT: 1.7 10*9/L — ABNORMAL LOW (ref 1.8–7.8)
NEUTROPHILS RELATIVE PERCENT: 36.4 %
PLATELET COUNT: 141 10*9/L — ABNORMAL LOW (ref 150–450)
RED BLOOD CELL COUNT: 3.74 10*12/L — ABNORMAL LOW (ref 3.95–5.13)
RED CELL DISTRIBUTION WIDTH: 13.2 % (ref 12.2–15.2)
WBC ADJUSTED: 4.7 10*9/L (ref 3.6–11.2)

## 2024-12-08 LAB — COMPREHENSIVE METABOLIC PANEL
ALBUMIN: 2.1 g/dL — ABNORMAL LOW (ref 3.4–5.0)
ALKALINE PHOSPHATASE: 204 U/L — ABNORMAL HIGH (ref 46–116)
ALT (SGPT): 14 U/L (ref 10–49)
ANION GAP: 7 mmol/L (ref 5–14)
AST (SGOT): 32 U/L (ref <=34)
BILIRUBIN TOTAL: 0.4 mg/dL (ref 0.3–1.2)
BLOOD UREA NITROGEN: 11 mg/dL (ref 9–23)
BUN / CREAT RATIO: 10
CALCIUM: 8.2 mg/dL — ABNORMAL LOW (ref 8.7–10.4)
CHLORIDE: 107 mmol/L (ref 98–107)
CO2: 30 mmol/L (ref 20.0–31.0)
CREATININE: 1.08 mg/dL — ABNORMAL HIGH (ref 0.55–1.02)
EGFR CKD-EPI (2021) FEMALE: 69 mL/min/1.73m2 (ref >=60)
GLUCOSE RANDOM: 111 mg/dL (ref 70–179)
POTASSIUM: 3.8 mmol/L (ref 3.4–4.8)
PROTEIN TOTAL: 5.3 g/dL — ABNORMAL LOW (ref 5.7–8.2)
SODIUM: 144 mmol/L (ref 135–145)

## 2024-12-08 LAB — MAGNESIUM: MAGNESIUM: 1.9 mg/dL (ref 1.6–2.6)

## 2024-12-08 MED ORDER — ZIPRASIDONE 80 MG CAPSULE
ORAL_CAPSULE | Freq: Two times a day (BID) | ORAL | 0 refills | 7.00000 days | Status: CP
Start: 2024-12-08 — End: ?

## 2024-12-08 MED ADMIN — covid vaccine 2025-26 (12yrs up)(Pfizer)(PF) 30 mcg/0.3 mL prefilled syringe ADS Med: INTRAMUSCULAR | @ 20:00:00 | Stop: 2024-12-08

## 2024-12-09 DIAGNOSIS — C9210 Chronic myeloid leukemia, BCR/ABL-positive, not having achieved remission: Principal | ICD-10-CM

## 2024-12-15 ENCOUNTER — Emergency Department
Admit: 2024-12-15 | Discharge: 2024-12-16 | Disposition: A | Discharge status: Home / Self Care | Payer: Medicaid (Managed Care)

## 2024-12-15 DIAGNOSIS — E861 Hypovolemia: Principal | ICD-10-CM

## 2024-12-15 LAB — URINALYSIS WITH MICROSCOPY WITH CULTURE REFLEX PERFORMABLE
BACTERIA: NONE SEEN /HPF
BILIRUBIN UA: NEGATIVE
BLOOD UA: NEGATIVE
GLUCOSE UA: NEGATIVE
KETONES UA: NEGATIVE
NITRITE UA: NEGATIVE
PH UA: 5.5 (ref 5.0–9.0)
PROTEIN UA: NEGATIVE
RBC UA: <1 /HPF (ref <=4)
SPECIFIC GRAVITY UA: 1.015 (ref 1.003–1.030)
SQUAMOUS EPITHELIAL: 1 /HPF (ref 0–5)
UROBILINOGEN UA: <2
WBC UA: 43 /HPF — ABNORMAL HIGH (ref 0–5)

## 2024-12-15 LAB — CBC W/ AUTO DIFF
BASOPHILS ABSOLUTE COUNT: 0.1 10*9/L (ref 0.0–0.1)
BASOPHILS RELATIVE PERCENT: 1.1 %
EOSINOPHILS ABSOLUTE COUNT: 0 10*9/L (ref 0.0–0.5)
EOSINOPHILS RELATIVE PERCENT: 0.2 %
HEMATOCRIT: 38.4 % (ref 34.0–44.0)
HEMOGLOBIN: 13.2 g/dL (ref 11.3–14.9)
LYMPHOCYTES ABSOLUTE COUNT: 1.8 10*9/L (ref 1.1–3.6)
LYMPHOCYTES RELATIVE PERCENT: 19.9 %
MEAN CORPUSCULAR HEMOGLOBIN CONC: 34.4 g/dL (ref 32.0–36.0)
MEAN CORPUSCULAR HEMOGLOBIN: 32.2 pg (ref 25.9–32.4)
MEAN CORPUSCULAR VOLUME: 93.6 fL (ref 77.6–95.7)
MEAN PLATELET VOLUME: 9.4 fL (ref 6.8–10.7)
MONOCYTES ABSOLUTE COUNT: 0.5 10*9/L (ref 0.3–0.8)
MONOCYTES RELATIVE PERCENT: 5.4 %
NEUTROPHILS ABSOLUTE COUNT: 6.8 10*9/L (ref 1.8–7.8)
NEUTROPHILS RELATIVE PERCENT: 73.4 %
NUCLEATED RED BLOOD CELLS: 0 /100{WBCs} (ref <=4)
PLATELET COUNT: 129 10*9/L — ABNORMAL LOW (ref 150–450)
RED BLOOD CELL COUNT: 4.1 10*12/L (ref 3.95–5.13)
RED CELL DISTRIBUTION WIDTH: 13.1 % (ref 12.2–15.2)
WBC ADJUSTED: 9.3 10*9/L (ref 3.6–11.2)

## 2024-12-15 LAB — COMPREHENSIVE METABOLIC PANEL
ALBUMIN: 2.2 g/dL — ABNORMAL LOW (ref 3.4–5.0)
ALKALINE PHOSPHATASE: 262 U/L — ABNORMAL HIGH (ref 46–116)
ALT (SGPT): 21 U/L (ref 10–49)
ANION GAP: 14 mmol/L (ref 5–14)
AST (SGOT): 65 U/L — ABNORMAL HIGH (ref <=34)
BILIRUBIN TOTAL: 0.4 mg/dL (ref 0.3–1.2)
BLOOD UREA NITROGEN: 5 mg/dL — ABNORMAL LOW (ref 9–23)
BUN / CREAT RATIO: 6
CALCIUM: 8.4 mg/dL — ABNORMAL LOW (ref 8.7–10.4)
CHLORIDE: 105 mmol/L (ref 98–107)
CO2: 23.5 mmol/L (ref 20.0–31.0)
CREATININE: 0.79 mg/dL (ref 0.55–1.02)
EGFR CKD-EPI (2021) FEMALE: >90 mL/min/{1.73_m2} (ref >=60)
GLUCOSE RANDOM: 148 mg/dL (ref 70–179)
POTASSIUM: 4.3 mmol/L (ref 3.4–4.8)
PROTEIN TOTAL: 6.1 g/dL (ref 5.7–8.2)
SODIUM: 142 mmol/L (ref 135–145)

## 2024-12-15 LAB — PHOSPHORUS: PHOSPHORUS: 3.6 mg/dL (ref 2.4–5.1)

## 2024-12-15 LAB — MAGNESIUM: MAGNESIUM: 1.8 mg/dL (ref 1.6–2.6)

## 2024-12-15 LAB — LIPASE: LIPASE: 20 U/L (ref 12–53)

## 2024-12-15 LAB — PRO-BNP: PRO-BNP: 696 pg/mL — ABNORMAL HIGH (ref <=300.0)

## 2024-12-15 MED ADMIN — fosfomycin (MONUROL) packet 3 g: 3 g | ORAL | Stop: 2024-12-15

## 2024-12-15 MED ADMIN — sodium chloride 0.9% (NS) bolus 1,000 mL: 1000 mL | INTRAVENOUS | @ 23:00:00 | Stop: 2024-12-15
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
## 2024-12-15 NOTE — ED Triage Note (Signed)
 Pt came from UC, pt with orthostatic hypotension. Pt with extensive history with leukemia and gastric bypass. Pt with chronic diarrhea as well. Has had a hx of low K+ and low Magnesium . Sometimes has dizziness.

## 2024-12-16 DIAGNOSIS — C9210 Chronic myeloid leukemia, BCR/ABL-positive, not having achieved remission: Principal | ICD-10-CM
